# Patient Record
Sex: Male | Born: 1950 | Race: Black or African American | Hispanic: No | Marital: Married | Smoking: Former smoker
Health system: Southern US, Community
[De-identification: ages and names within clinical notes are randomized; demographics above are authoritative.]

## PROBLEM LIST (undated history)

## (undated) DIAGNOSIS — N186 End stage renal disease: Secondary | ICD-10-CM

## (undated) DIAGNOSIS — D649 Anemia, unspecified: Secondary | ICD-10-CM

## (undated) DIAGNOSIS — I10 Essential (primary) hypertension: Secondary | ICD-10-CM

## (undated) DIAGNOSIS — Z9289 Personal history of other medical treatment: Secondary | ICD-10-CM

## (undated) DIAGNOSIS — E039 Hypothyroidism, unspecified: Secondary | ICD-10-CM

## (undated) DIAGNOSIS — E119 Type 2 diabetes mellitus without complications: Secondary | ICD-10-CM

## (undated) DIAGNOSIS — S0990XA Unspecified injury of head, initial encounter: Secondary | ICD-10-CM

## (undated) DIAGNOSIS — K5900 Constipation, unspecified: Secondary | ICD-10-CM

## (undated) DIAGNOSIS — C8593 Non-Hodgkin lymphoma, unspecified, intra-abdominal lymph nodes: Secondary | ICD-10-CM

## (undated) DIAGNOSIS — R7303 Prediabetes: Secondary | ICD-10-CM

## (undated) DIAGNOSIS — C8338 Diffuse large B-cell lymphoma, lymph nodes of multiple sites: Secondary | ICD-10-CM

## (undated) DIAGNOSIS — R569 Unspecified convulsions: Secondary | ICD-10-CM

## (undated) DIAGNOSIS — G839 Paralytic syndrome, unspecified: Secondary | ICD-10-CM

## (undated) DIAGNOSIS — N189 Chronic kidney disease, unspecified: Secondary | ICD-10-CM

## (undated) DIAGNOSIS — R1900 Intra-abdominal and pelvic swelling, mass and lump, unspecified site: Secondary | ICD-10-CM

## (undated) DIAGNOSIS — I639 Cerebral infarction, unspecified: Secondary | ICD-10-CM

## (undated) HISTORY — PX: COLONOSCOPY: SHX174

## (undated) HISTORY — DX: Intra-abdominal and pelvic swelling, mass and lump, unspecified site: R19.00

## (undated) HISTORY — PX: HEMORRHOID SURGERY: SHX153

## (undated) HISTORY — DX: Non-hodgkin lymphoma, unspecified, intra-abdominal lymph nodes: C85.93

## (undated) HISTORY — DX: Diffuse large b-cell lymphoma, lymph nodes of multiple sites: C83.38

---

## 1996-01-29 DIAGNOSIS — I639 Cerebral infarction, unspecified: Secondary | ICD-10-CM

## 1996-01-29 HISTORY — DX: Cerebral infarction, unspecified: I63.9

## 1997-07-28 ENCOUNTER — Encounter
Admission: RE | Admit: 1997-07-28 | Discharge: 1997-10-26 | Payer: Self-pay | Admitting: Physical Medicine & Rehabilitation

## 2001-02-18 ENCOUNTER — Encounter: Admission: RE | Admit: 2001-02-18 | Discharge: 2001-05-19 | Payer: Self-pay | Admitting: Internal Medicine

## 2013-01-15 ENCOUNTER — Emergency Department (HOSPITAL_COMMUNITY)
Admission: EM | Admit: 2013-01-15 | Discharge: 2013-01-16 | Disposition: A | Discharge status: Home / Self Care | Payer: PRIVATE HEALTH INSURANCE | Attending: Emergency Medicine | Admitting: Emergency Medicine

## 2013-01-15 ENCOUNTER — Encounter (HOSPITAL_COMMUNITY): Payer: Self-pay | Admitting: Emergency Medicine

## 2013-01-15 ENCOUNTER — Emergency Department (HOSPITAL_COMMUNITY): Payer: PRIVATE HEALTH INSURANCE

## 2013-01-15 DIAGNOSIS — Z79899 Other long term (current) drug therapy: Secondary | ICD-10-CM | POA: Insufficient documentation

## 2013-01-15 DIAGNOSIS — R42 Dizziness and giddiness: Secondary | ICD-10-CM | POA: Insufficient documentation

## 2013-01-15 DIAGNOSIS — Z7982 Long term (current) use of aspirin: Secondary | ICD-10-CM | POA: Insufficient documentation

## 2013-01-15 DIAGNOSIS — Z87891 Personal history of nicotine dependence: Secondary | ICD-10-CM | POA: Insufficient documentation

## 2013-01-15 DIAGNOSIS — E119 Type 2 diabetes mellitus without complications: Secondary | ICD-10-CM | POA: Insufficient documentation

## 2013-01-15 DIAGNOSIS — R61 Generalized hyperhidrosis: Secondary | ICD-10-CM | POA: Insufficient documentation

## 2013-01-15 DIAGNOSIS — I10 Essential (primary) hypertension: Secondary | ICD-10-CM | POA: Insufficient documentation

## 2013-01-15 DIAGNOSIS — E785 Hyperlipidemia, unspecified: Secondary | ICD-10-CM | POA: Insufficient documentation

## 2013-01-15 DIAGNOSIS — R55 Syncope and collapse: Secondary | ICD-10-CM

## 2013-01-15 DIAGNOSIS — I69959 Hemiplegia and hemiparesis following unspecified cerebrovascular disease affecting unspecified side: Secondary | ICD-10-CM | POA: Insufficient documentation

## 2013-01-15 HISTORY — DX: Cerebral infarction, unspecified: I63.9

## 2013-01-15 HISTORY — DX: Essential (primary) hypertension: I10

## 2013-01-15 HISTORY — DX: Type 2 diabetes mellitus without complications: E11.9

## 2013-01-15 LAB — POCT I-STAT, CHEM 8
BUN: 35 mg/dL — ABNORMAL HIGH (ref 6–23)
Chloride: 99 mEq/L (ref 96–112)
HCT: 37 % — ABNORMAL LOW (ref 39.0–52.0)
Sodium: 137 mEq/L (ref 135–145)
TCO2: 25 mmol/L (ref 0–100)

## 2013-01-15 LAB — CBC
Hemoglobin: 11.3 g/dL — ABNORMAL LOW (ref 13.0–17.0)
Platelets: 183 10*3/uL (ref 150–400)
RBC: 3.81 MIL/uL — ABNORMAL LOW (ref 4.22–5.81)
RDW: 13.4 % (ref 11.5–15.5)
WBC: 3.8 10*3/uL — ABNORMAL LOW (ref 4.0–10.5)

## 2013-01-15 LAB — BASIC METABOLIC PANEL
BUN: 35 mg/dL — ABNORMAL HIGH (ref 6–23)
CO2: 22 mEq/L (ref 19–32)
Calcium: 8.5 mg/dL (ref 8.4–10.5)
GFR calc non Af Amer: 15 mL/min — ABNORMAL LOW (ref >90)
Potassium: 2.9 mEq/L — ABNORMAL LOW (ref 3.5–5.1)
Sodium: 133 mEq/L — ABNORMAL LOW (ref 135–145)

## 2013-01-15 MED ORDER — SODIUM CHLORIDE 0.9 % IV BOLUS (SEPSIS)
500.0000 mL | Freq: Once | INTRAVENOUS | Status: AC
  Administered 2013-01-16: 500 mL via INTRAVENOUS

## 2013-01-15 MED ORDER — POTASSIUM CHLORIDE CRYS ER 20 MEQ PO TBCR
40.0000 meq | EXTENDED_RELEASE_TABLET | Freq: Once | ORAL | Status: AC
  Administered 2013-01-16: 40 meq via ORAL
  Filled 2013-01-15: qty 2

## 2013-01-15 NOTE — ED Provider Notes (Signed)
CSN: [id number]     Arrival date & time 01/15/13  2155 History   First MD Initiated Contact with Patient 01/15/13 2302     Chief Complaint  Patient presents with  . Weakness   (Consider location/radiation/quality/duration/timing/severity/associated sxs/prior Treatment) HPI This patient is an elderly man who has chronic right arm weakness from her remote stroke. In addition, he has hypertension and hyperlipidemia.  The patient is brought to the emergency department after near syncopal episode which occurred at home and was witnessed by his wife.  The patient had taken a nap in his recliner at home. His wife woke him up and felt that his skin is damp. As the patient was standing up, he felt lightheaded and sat back down into the chair. He did not lose consciousness. The wife says that his skin looked more diaphoretic. So, she called 911.  The patient has recollection of the event. He denies experiencing any palpitations, rapid heart rate, chest pain or shortness of breath. He reports normal by mouth intake. Review of systems is notable only for a complaint of scratchy throat-"like I am getting a head cold".  No recent medications changes. Patient reports compliance of all medicines.  Past Medical History  Diagnosis Date  . Hypertension   . Diabetes mellitus without complication   . Stroke    Past Surgical History  Procedure Laterality Date  . Hemorrhoid surgery     History reviewed. No pertinent family history. History  Substance Use Topics  . Smoking status: Former Research scientist (life sciences)  . Smokeless tobacco: Not on file  . Alcohol Use: No    Review of Systems 10 point review of systems obtained and is negative with the exception of symptoms noted above.  Allergies  Review of patient's allergies indicates no known allergies.  Home Medications   Current Outpatient Rx  Name  Route  Sig  Dispense  Refill  . aspirin 325 MG tablet   Oral   Take 325 mg by mouth daily.         Marland Kitchen  atorvastatin (LIPITOR) 40 MG tablet   Oral   Take 40 mg by mouth at bedtime.         . cloNIDine (CATAPRES) 0.2 MG tablet   Oral   Take 0.2 mg by mouth 2 (two) times daily.         . irbesartan (AVAPRO) 300 MG tablet   Oral   Take 300 mg by mouth daily.         . metoprolol (LOPRESSOR) 100 MG tablet   Oral   Take 100 mg by mouth 2 (two) times daily.         Marland Kitchen NIFEdipine (PROCARDIA XL/ADALAT-CC) 90 MG 24 hr tablet   Oral   Take 90 mg by mouth daily.         . potassium chloride (K-DUR,KLOR-CON) 10 MEQ tablet   Oral   Take 10 mEq by mouth 2 (two) times daily.         Marland Kitchen torsemide (DEMADEX) 100 MG tablet   Oral   Take 100 mg by mouth daily.          BP 131/81  Pulse 57  Temp(Src) 97.8 F (36.6 C) (Oral)  Resp 18  Ht 5\' 11"  (1.803 m)  Wt 216 lb (97.977 kg)  BMI 30.14 kg/m2  SpO2 97% Physical Exam Gen: well developed and well nourished appearing Head: NCAT Eyes: PERL, EOMI Nose: no epistaixis or rhinorrhea Mouth/throat: mucosa is moist and pink Neck:  supple, no stridor Lungs: CTA B, no wheezing, rhonchi or rales CV: RRR, no murmur, good extremity pulses  Abd: soft, notender, nondistended Back: no midline ttp Skin: warm and dry, no lesions Neuro: Left facial droop, right arm weakness, otherwise unremarkable neurologic exam..  Psyche; normal affect,  calm and cooperative.   ED Course  Procedures (including critical care time) Labs Review  Results for orders placed during the hospital encounter of 01/15/13 (from the past 24 hour(s))  BASIC METABOLIC PANEL     Status: Abnormal   Collection Time    01/15/13 10:56 PM      Result Value Range   Sodium 133 (*) 135 - 145 mEq/L   Potassium 2.9 (*) 3.5 - 5.1 mEq/L   Chloride 98  96 - 112 mEq/L   CO2 22  19 - 32 mEq/L   Glucose, Bld 143 (*) 70 - 99 mg/dL   BUN 35 (*) 6 - 23 mg/dL   Creatinine, Ser 3.92 (*) 0.50 - 1.35 mg/dL   Calcium 8.5  8.4 - 10.5 mg/dL   GFR calc non Af Amer 15 (*) >90 mL/min   GFR  calc Af Amer 17 (*) >90 mL/min  CBC     Status: Abnormal   Collection Time    01/15/13 10:56 PM      Result Value Range   WBC 3.8 (*) 4.0 - 10.5 K/uL   RBC 3.81 (*) 4.22 - 5.81 MIL/uL   Hemoglobin 11.3 (*) 13.0 - 17.0 g/dL   HCT 33.2 (*) 39.0 - 52.0 %   MCV 87.1  78.0 - 100.0 fL   MCH 29.7  26.0 - 34.0 pg   MCHC 34.0  30.0 - 36.0 g/dL   RDW 13.4  11.5 - 15.5 %   Platelets 183  150 - 400 K/uL  POCT I-STAT, CHEM 8     Status: Abnormal   Collection Time    01/15/13 11:11 PM      Result Value Range   Sodium 137  135 - 145 mEq/L   Potassium 2.9 (*) 3.5 - 5.1 mEq/L   Chloride 99  96 - 112 mEq/L   BUN 35 (*) 6 - 23 mg/dL   Creatinine, Ser 4.20 (*) 0.50 - 1.35 mg/dL   Glucose, Bld 145 (*) 70 - 99 mg/dL   Calcium, Ion 1.16  1.13 - 1.30 mmol/L   TCO2 25  0 - 100 mmol/L   Hemoglobin 12.6 (*) 13.0 - 17.0 g/dL   HCT 37.0 (*) 39.0 - 52.0 %  POCT I-STAT TROPONIN I     Status: None   Collection Time    01/15/13 11:52 PM      Result Value Range   Troponin i, poc 0.00  0.00 - 0.08 ng/mL   Comment 3           URINALYSIS, ROUTINE W REFLEX MICROSCOPIC     Status: Abnormal   Collection Time    01/16/13 12:56 AM      Result Value Range   Color, Urine YELLOW  YELLOW   APPearance CLEAR  CLEAR   Specific Gravity, Urine 1.013  1.005 - 1.030   pH 6.0  5.0 - 8.0   Glucose, UA NEGATIVE  NEGATIVE mg/dL   Hgb urine dipstick SMALL (*) NEGATIVE   Bilirubin Urine NEGATIVE  NEGATIVE   Ketones, ur NEGATIVE  NEGATIVE mg/dL   Protein, ur 100 (*) NEGATIVE mg/dL   Urobilinogen, UA 0.2  0.0 - 1.0 mg/dL   Nitrite NEGATIVE  NEGATIVE   Leukocytes, UA NEGATIVE  NEGATIVE  URINE MICROSCOPIC-ADD ON     Status: None   Collection Time    01/16/13 12:56 AM      Result Value Range   Squamous Epithelial / LPF RARE  RARE   WBC, UA 0-2  <3 WBC/hpf   RBC / HPF 0-2  <3 RBC/hpf   Bacteria, UA RARE  RARE  POCT I-STAT TROPONIN I     Status: None   Collection Time    01/16/13  2:34 AM      Result Value Range    Troponin i, poc 0.00  0.00 - 0.08 ng/mL   Comment 3            CXR: normal cardiac silloute, normal appearing mediastinum, no infiltrates, no acute process identified.   EKG Interpretation    Date/Time:  Friday January 15 2013 22:07:37 EST Ventricular Rate:  59 PR Interval:  197 QRS Duration: 98 QT Interval:  456 QTC Calculation: 452 R Axis:   -26 Text Interpretation:  Sinus rhythm Borderline left axis deviation Borderline T wave abnormalities Confirmed by [facility]  MD, Devian Bartolomei ([id number]) on 01/15/2013 11:03:02 PM            MDM  ED work up for near syncope is unremarkable with normal EKG, unremarkable labs, normal CXR, normal U/A, normal orthostatic VS. The patient has no cardiac history making cardiogenic syncope less likely. In light of postural change to standing preceding this episode, I suspect he had a vasovagal episode. He has ambulated in the hallway, is asking to go home and is stable for discharge.    Elyn Peers, MD 01/16/13 [phone]

## 2013-01-15 NOTE — ED Notes (Signed)
Patients wife stated when she woke him up from a nap approximately 45 min ago he had a spell where he became very pale and skin was clammy.  Stated he has been fighting a cold since Tuesday with a scratchy throat.  No N/V/D, no pain just feels bad.

## 2013-01-15 NOTE — ED Notes (Signed)
Patient presents via [facility] from home.  Staff reported that wife stated he had been feeling weak since Tuesday.  Skin clammy, incontinent when napping.

## 2013-01-16 LAB — URINALYSIS, ROUTINE W REFLEX MICROSCOPIC
Glucose, UA: NEGATIVE mg/dL
Ketones, ur: NEGATIVE mg/dL
Leukocytes, UA: NEGATIVE
Specific Gravity, Urine: 1.013 (ref 1.005–1.030)
Urobilinogen, UA: 0.2 mg/dL (ref 0.0–1.0)
pH: 6 (ref 5.0–8.0)

## 2013-01-16 LAB — URINE MICROSCOPIC-ADD ON

## 2013-01-16 LAB — POCT I-STAT TROPONIN I: Troponin i, poc: 0 ng/mL (ref 0.00–0.08)

## 2013-01-16 NOTE — ED Notes (Signed)
Pt ambulated in hall, with min assist.

## 2014-04-02 ENCOUNTER — Encounter (HOSPITAL_COMMUNITY): Payer: Self-pay | Admitting: Emergency Medicine

## 2014-04-02 ENCOUNTER — Emergency Department (HOSPITAL_COMMUNITY)
Admission: EM | Admit: 2014-04-02 | Discharge: 2014-04-03 | Disposition: A | Discharge status: Home / Self Care | Payer: PRIVATE HEALTH INSURANCE | Attending: Emergency Medicine | Admitting: Emergency Medicine

## 2014-04-02 DIAGNOSIS — Z79899 Other long term (current) drug therapy: Secondary | ICD-10-CM | POA: Diagnosis not present

## 2014-04-02 DIAGNOSIS — Z7982 Long term (current) use of aspirin: Secondary | ICD-10-CM | POA: Insufficient documentation

## 2014-04-02 DIAGNOSIS — K5904 Chronic idiopathic constipation: Secondary | ICD-10-CM

## 2014-04-02 DIAGNOSIS — Z8673 Personal history of transient ischemic attack (TIA), and cerebral infarction without residual deficits: Secondary | ICD-10-CM | POA: Diagnosis not present

## 2014-04-02 DIAGNOSIS — Z87891 Personal history of nicotine dependence: Secondary | ICD-10-CM | POA: Insufficient documentation

## 2014-04-02 DIAGNOSIS — K5900 Constipation, unspecified: Secondary | ICD-10-CM | POA: Diagnosis not present

## 2014-04-02 DIAGNOSIS — I10 Essential (primary) hypertension: Secondary | ICD-10-CM | POA: Insufficient documentation

## 2014-04-02 DIAGNOSIS — K602 Anal fissure, unspecified: Secondary | ICD-10-CM

## 2014-04-02 DIAGNOSIS — K625 Hemorrhage of anus and rectum: Secondary | ICD-10-CM | POA: Diagnosis present

## 2014-04-02 DIAGNOSIS — E119 Type 2 diabetes mellitus without complications: Secondary | ICD-10-CM | POA: Insufficient documentation

## 2014-04-02 LAB — CBC WITH DIFFERENTIAL/PLATELET
Basophils Absolute: 0.1 10*3/uL (ref 0.0–0.1)
Basophils Relative: 1 % (ref 0–1)
EOS ABS: 0.3 10*3/uL (ref 0.0–0.7)
Eosinophils Relative: 4 % (ref 0–5)
HEMATOCRIT: 35 % — AB (ref 39.0–52.0)
Hemoglobin: 11.7 g/dL — ABNORMAL LOW (ref 13.0–17.0)
LYMPHS PCT: 26 % (ref 12–46)
Lymphs Abs: 1.6 10*3/uL (ref 0.7–4.0)
MCH: 29.3 pg (ref 26.0–34.0)
MCHC: 33.4 g/dL (ref 30.0–36.0)
MCV: 87.5 fL (ref 78.0–100.0)
MONO ABS: 0.4 10*3/uL (ref 0.1–1.0)
Monocytes Relative: 7 % (ref 3–12)
NEUTROS ABS: 3.8 10*3/uL (ref 1.7–7.7)
Neutrophils Relative %: 62 % (ref 43–77)
PLATELETS: 274 10*3/uL (ref 150–400)
RBC: 4 MIL/uL — AB (ref 4.22–5.81)
RDW: 13.5 % (ref 11.5–15.5)
WBC: 6.2 10*3/uL (ref 4.0–10.5)

## 2014-04-02 LAB — COMPREHENSIVE METABOLIC PANEL
ALBUMIN: 4.1 g/dL (ref 3.5–5.2)
ALT: 17 U/L (ref 0–53)
ANION GAP: 12 (ref 5–15)
AST: 24 U/L (ref 0–37)
Alkaline Phosphatase: 52 U/L (ref 39–117)
BUN: 50 mg/dL — ABNORMAL HIGH (ref 6–23)
CO2: 23 mmol/L (ref 19–32)
Calcium: 9.1 mg/dL (ref 8.4–10.5)
Chloride: 102 mmol/L (ref 96–112)
Creatinine, Ser: 5.56 mg/dL — ABNORMAL HIGH (ref 0.50–1.35)
GFR calc Af Amer: 11 mL/min — ABNORMAL LOW (ref >90)
GFR, EST NON AFRICAN AMERICAN: 10 mL/min — AB (ref >90)
Glucose, Bld: 112 mg/dL — ABNORMAL HIGH (ref 70–99)
POTASSIUM: 3.1 mmol/L — AB (ref 3.5–5.1)
SODIUM: 137 mmol/L (ref 135–145)
TOTAL PROTEIN: 8 g/dL (ref 6.0–8.3)
Total Bilirubin: 0.6 mg/dL (ref 0.3–1.2)

## 2014-04-02 LAB — TYPE AND SCREEN
ABO/RH(D): O POS
ANTIBODY SCREEN: NEGATIVE

## 2014-04-02 LAB — POC OCCULT BLOOD, ED: Fecal Occult Bld: POSITIVE — AB

## 2014-04-02 NOTE — ED Provider Notes (Signed)
CSN: [id number]     Arrival date & time 04/02/14  2008 History   First MD Initiated Contact with Patient 04/02/14 2335     Chief Complaint  Patient presents with  . Rectal Bleeding    The patient said he has had rectal bleeding but denies pain.  The patient's wife says it has been on gon for three weeks.     (Consider location/radiation/quality/duration/timing/severity/associated sxs/prior Treatment) HPI Comments: Patient reports intermittent episodes of rectal bleeding with BM's that are hard and dry--states the medication he takes causes this.  Has a HX of hemorrhoids requiring surgery.  Denies pain, blood in underwear   Patient is a 64 y.o. male presenting with hematochezia. The history is provided by the patient and the spouse.  Rectal Bleeding Quality:  Mucoid Amount:  Scant Duration:  3 weeks Timing:  Intermittent Progression:  Unchanged Chronicity:  New Context: constipation and defecation   Context: not diarrhea, not hemorrhoids, not rectal injury and not rectal pain   Similar prior episodes: yes   Relieved by:  Hemorrhoid cream Worsened by:  Defecation Ineffective treatments:  None tried Associated symptoms: no abdominal pain, no fever, no loss of consciousness and no vomiting     Past Medical History  Diagnosis Date  . Hypertension   . Diabetes mellitus without complication   . Stroke    Past Surgical History  Procedure Laterality Date  . Hemorrhoid surgery     History reviewed. No pertinent family history. History  Substance Use Topics  . Smoking status: Former Research scientist (life sciences)  . Smokeless tobacco: Not on file  . Alcohol Use: No    Review of Systems  Constitutional: Negative for fever and chills.  Gastrointestinal: Positive for constipation, blood in stool and hematochezia. Negative for nausea, vomiting, abdominal pain and abdominal distention.  Genitourinary: Negative for dysuria.  Neurological: Negative for loss of consciousness.  All other systems reviewed  and are negative.     Allergies  Review of patient's allergies indicates no known allergies.  Home Medications   Prior to Admission medications   Medication Sig Start Date End Date Taking? Authorizing Provider  aspirin 325 MG tablet Take 325 mg by mouth daily.   Yes Historical Provider, MD  bumetanide (BUMEX) 2 MG tablet Take 2 mg by mouth daily. 03/05/14  Yes Historical Provider, MD  cloNIDine (CATAPRES) 0.2 MG tablet Take 0.2 mg by mouth 2 (two) times daily.   Yes Historical Provider, MD  doxazosin (CARDURA) 4 MG tablet Take 4 mg by mouth every evening.  03/28/14  Yes Historical Provider, MD  levothyroxine (SYNTHROID, LEVOTHROID) 25 MCG tablet Take 12.5 mcg by mouth daily. 01/29/14  Yes Historical Provider, MD  metoprolol (LOPRESSOR) 100 MG tablet Take 100 mg by mouth 2 (two) times daily.   Yes Historical Provider, MD  NIFEdipine (ADALAT CC) 90 MG 24 hr tablet Take 90 mg by mouth daily. 03/28/14  Yes Historical Provider, MD  potassium chloride (K-DUR,KLOR-CON) 10 MEQ tablet Take 10 mEq by mouth 2 (two) times daily.   Yes Historical Provider, MD  docusate sodium (COLACE) 100 MG capsule Take 1 capsule (100 mg total) by mouth every 12 (twelve) hours. 04/03/14   Garald Balding, NP  NIFEdipine (PROCARDIA XL/ADALAT-CC) 90 MG 24 hr tablet Take 90 mg by mouth daily.    Historical Provider, MD   BP 199/116 mmHg  Pulse 80  Temp(Src) 98 F (36.7 C) (Oral)  Resp 18  SpO2 95% Physical Exam  Constitutional: He is oriented to person,  place, and time. He appears well-developed and well-nourished.  HENT:  Head: Normocephalic.  Eyes: Pupils are equal, round, and reactive to light.  Neck: Normal range of motion.  Cardiovascular: Normal rate and regular rhythm.   Pulmonary/Chest: Effort normal and breath sounds normal.  Abdominal: Soft. He exhibits no distension. There is no tenderness.  Genitourinary: Rectal exam shows no external hemorrhoid and no internal hemorrhoid. Guaiac positive stool. Prostate is  not enlarged and not tender.  At the 2 o clock location questionable fissura  Guaiac Minimally positive mucoid in appearance with slight tinge    Musculoskeletal: Normal range of motion. He exhibits no edema or tenderness.  Neurological: He is oriented to person, place, and time.  Skin: Skin is warm.  Nursing note and vitals reviewed.   ED Course  Procedures (including critical care time) Labs Review Labs Reviewed  COMPREHENSIVE METABOLIC PANEL - Abnormal; Notable for the following:    Potassium 3.1 (*)    Glucose, Bld 112 (*)    BUN 50 (*)    Creatinine, Ser 5.56 (*)    GFR calc non Af Amer 10 (*)    GFR calc Af Amer 11 (*)    All other components within normal limits  CBC WITH DIFFERENTIAL/PLATELET - Abnormal; Notable for the following:    RBC 4.00 (*)    Hemoglobin 11.7 (*)    HCT 35.0 (*)    All other components within normal limits  POC OCCULT BLOOD, ED - Abnormal; Notable for the following:    Fecal Occult Bld POSITIVE (*)    All other components within normal limits  POC OCCULT BLOOD, ED  TYPE AND SCREEN    Imaging Review No results found.   EKG Interpretation None      MDM   Final diagnoses:  Rectal fissure  Constipation - functional         Garald Balding, NP 04/03/14 0006  Hoy Morn, MD 04/03/14 [phone]

## 2014-04-02 NOTE — ED Notes (Signed)
The patient said he has had rectal bleeding but denies pain.  The patient's wife says it has been on gon for three weeks.  The patient said he had hemorrhoid surgery and he used to bleed then.  He does not know if he has hemorrhoids again or not.

## 2014-04-03 LAB — ABO/RH: ABO/RH(D): O POS

## 2014-04-03 MED ORDER — DOCUSATE SODIUM 100 MG PO CAPS: 100.0000 mg | ORAL_CAPSULE | Freq: Two times a day (BID) | ORAL | Status: DC

## 2014-04-03 NOTE — Discharge Instructions (Signed)
Anal Fissure, Adult An anal fissure is a small tear or crack in the skin around the anus. Bleeding from a fissure usually stops on its own within a few minutes. However, bleeding will often reoccur with each bowel movement until the crack heals.  CAUSES   Passing large, hard stools.  Frequent diarrheal stools.  Constipation.  Inflammatory bowel disease (Crohn's disease or ulcerative colitis).  Infections.  Anal sex. SYMPTOMS   Small amounts of blood seen on your stools, on toilet paper, or in the toilet after a bowel movement.  Rectal bleeding.  Painful bowel movements.  Itching or irritation around the anus. DIAGNOSIS Your caregiver will examine the anal area. An anal fissure can usually be seen with careful inspection. A rectal exam may be performed and a short tube (anoscope) may be used to examine the anal canal. TREATMENT   You may be instructed to take fiber supplements. These supplements can soften your stool to help make bowel movements easier.  Sitz baths may be recommended to help heal the tear. Do not use soap in the sitz baths.  A medicated cream or ointment may be prescribed to lessen discomfort. HOME CARE INSTRUCTIONS   Maintain a diet high in fruits, whole grains, and vegetables. Avoid constipating foods like bananas and dairy products.  Take sitz baths as directed by your caregiver.  Drink enough fluids to keep your urine clear or pale yellow.  Only take over-the-counter or prescription medicines for pain, discomfort, or fever as directed by your caregiver. Do not take aspirin as this may increase bleeding.  Do not use ointments containing numbing medications (anesthetics) or hydrocortisone. They could slow healing. SEEK MEDICAL CARE IF:   Your fissure is not completely healed within 3 days.  You have further bleeding.  You have a fever.  You have diarrhea mixed with blood.  You have pain.  Your problem is getting worse rather than  better. MAKE SURE YOU:   Understand these instructions.  Will watch your condition.  Will get help right away if you are not doing well or get worse. Document Released: 01/14/2005 Document Revised: 04/08/2011 Document Reviewed: 07/01/2010 [facility] Patient Information 2015 [place], [place]. This information is not intended to replace advice given to you by your health care provider. Make sure you discuss any questions you have with your health care provider. Make an appointment with your PCP for follow up evaluation

## 2014-06-29 ENCOUNTER — Emergency Department (HOSPITAL_COMMUNITY)
Admission: EM | Admit: 2014-06-29 | Discharge: 2014-06-29 | Disposition: A | Discharge status: Home / Self Care | Payer: PRIVATE HEALTH INSURANCE | Attending: Emergency Medicine | Admitting: Emergency Medicine

## 2014-06-29 ENCOUNTER — Emergency Department (HOSPITAL_COMMUNITY): Payer: PRIVATE HEALTH INSURANCE

## 2014-06-29 ENCOUNTER — Encounter (HOSPITAL_COMMUNITY): Payer: Self-pay | Admitting: *Deleted

## 2014-06-29 DIAGNOSIS — E119 Type 2 diabetes mellitus without complications: Secondary | ICD-10-CM | POA: Insufficient documentation

## 2014-06-29 DIAGNOSIS — Z8673 Personal history of transient ischemic attack (TIA), and cerebral infarction without residual deficits: Secondary | ICD-10-CM | POA: Diagnosis not present

## 2014-06-29 DIAGNOSIS — I10 Essential (primary) hypertension: Secondary | ICD-10-CM | POA: Insufficient documentation

## 2014-06-29 DIAGNOSIS — Z79899 Other long term (current) drug therapy: Secondary | ICD-10-CM | POA: Diagnosis not present

## 2014-06-29 DIAGNOSIS — R55 Syncope and collapse: Secondary | ICD-10-CM | POA: Diagnosis present

## 2014-06-29 DIAGNOSIS — R42 Dizziness and giddiness: Secondary | ICD-10-CM | POA: Diagnosis not present

## 2014-06-29 DIAGNOSIS — Z87891 Personal history of nicotine dependence: Secondary | ICD-10-CM | POA: Insufficient documentation

## 2014-06-29 DIAGNOSIS — R531 Weakness: Secondary | ICD-10-CM | POA: Diagnosis not present

## 2014-06-29 LAB — BASIC METABOLIC PANEL
Anion gap: 16 — ABNORMAL HIGH (ref 5–15)
BUN: 65 mg/dL — ABNORMAL HIGH (ref 6–20)
CO2: 21 mmol/L — ABNORMAL LOW (ref 22–32)
Calcium: 8.6 mg/dL — ABNORMAL LOW (ref 8.9–10.3)
Chloride: 93 mmol/L — ABNORMAL LOW (ref 101–111)
Creatinine, Ser: 6.41 mg/dL — ABNORMAL HIGH (ref 0.61–1.24)
GFR, EST AFRICAN AMERICAN: 9 mL/min — AB (ref >60)
GFR, EST NON AFRICAN AMERICAN: 8 mL/min — AB (ref >60)
GLUCOSE: 239 mg/dL — AB (ref 65–99)
Potassium: 3.1 mmol/L — ABNORMAL LOW (ref 3.5–5.1)
Sodium: 130 mmol/L — ABNORMAL LOW (ref 135–145)

## 2014-06-29 LAB — CBC
HCT: 32.5 % — ABNORMAL LOW (ref 39.0–52.0)
Hemoglobin: 10.9 g/dL — ABNORMAL LOW (ref 13.0–17.0)
MCH: 29.1 pg (ref 26.0–34.0)
MCHC: 33.5 g/dL (ref 30.0–36.0)
MCV: 86.7 fL (ref 78.0–100.0)
Platelets: 287 10*3/uL (ref 150–400)
RBC: 3.75 MIL/uL — AB (ref 4.22–5.81)
RDW: 13.5 % (ref 11.5–15.5)
WBC: 5.5 10*3/uL (ref 4.0–10.5)

## 2014-06-29 LAB — I-STAT TROPONIN, ED: TROPONIN I, POC: 0.01 ng/mL (ref 0.00–0.08)

## 2014-06-29 LAB — BRAIN NATRIURETIC PEPTIDE: B Natriuretic Peptide: 74.9 pg/mL (ref 0.0–100.0)

## 2014-06-29 MED ORDER — SODIUM CHLORIDE 0.9 % IV BOLUS (SEPSIS)
250.0000 mL | Freq: Once | INTRAVENOUS | Status: AC
  Administered 2014-06-29: 250 mL via INTRAVENOUS

## 2014-06-29 MED ORDER — SODIUM CHLORIDE 0.9 % IV SOLN
INTRAVENOUS | Status: DC
  Administered 2014-06-29: 21:00:00 via INTRAVENOUS

## 2014-06-29 NOTE — ED Notes (Signed)
Pt able to stand and he states he feels fine, denied dizziness, weakness or lightheadedness. Family present at bedside.

## 2014-06-29 NOTE — ED Notes (Signed)
Pt in stating he was at church earlier and was standing and felt dizzy and weak, thought he would pass out, pt sat down and symptoms resolved, denies any symptoms or pain at this time, no distress noted

## 2014-06-29 NOTE — Discharge Instructions (Signed)
Near-Syncope Near-syncope (commonly known as near fainting) is sudden weakness, dizziness, or feeling like you might pass out. This can happen when getting up or while standing for a long time. It is caused by a sudden decrease in blood flow to the brain, which can occur for various reasons. Most of the reasons are not serious.  HOME CARE Watch your condition for any changes.  Have someone stay with you until you feel stable.  If you feel like you are going to pass out:  Lie down right away.  Prop your feet up if you can.  Breathe deeply and steadily.  Move only when the feeling has gone away. Most of the time, this feeling lasts only a few minutes. You may feel tired for several hours.  Drink enough fluids to keep your pee (urine) clear or pale yellow.  If you are taking blood pressure or heart medicine, stand up slowly.  Follow up with your doctor as told. GET HELP RIGHT AWAY IF:   You have a severe headache.  You have unusual pain in the chest, belly (abdomen), or back.  You have bleeding from the mouth or butt (rectum), or you have black or tarry poop (stool).  You feel your heart beat differently than normal, or you have a very fast pulse.  You pass out, or you twitch and shake when you pass out.  You pass out when sitting or lying down.  You feel confused.  You have trouble walking.  You are weak.  You have vision problems. MAKE SURE YOU:   Understand these instructions.  Will watch your condition.  Will get help right away if you are not doing well or get worse. Document Released: 07/03/2007 Document Revised: 01/19/2013 Document Reviewed: 06/19/2012 [facility] Patient Information 2015 [place], [place]. This information is not intended to replace advice given to you by your health care provider. Make sure you discuss any questions you have with your health care provider.  A Limited to follow-up with your regular doctor. Return for any new or worse symptoms.  Today's workup without any significant findings.

## 2014-06-29 NOTE — ED Provider Notes (Signed)
CSN: [id number]     Arrival date & time 06/29/14  1755 History   First MD Initiated Contact with Patient 06/29/14 1925     Chief Complaint  Patient presents with  . Near Syncope     (Consider location/radiation/quality/duration/timing/severity/associated sxs/prior Treatment) Patient is a 64 y.o. male presenting with near-syncope. The history is provided by the patient.  Near Syncope Pertinent negatives include no chest pain, no abdominal pain, no headaches and no shortness of breath.   patient was at church while standing felt dizzy and weak no room spinning. Thought maybe he would pass out. Patient felt fine earlier in the day. Patient has a history of a prior stroke with residual right-sided weakness. Patient without any worsening of any focal deficits. Patient denies any chest pain headache vertigo abdominal pain nausea or vomiting.  Past Medical History  Diagnosis Date  . Hypertension   . Diabetes mellitus without complication   . Stroke    Past Surgical History  Procedure Laterality Date  . Hemorrhoid surgery     History reviewed. No pertinent family history. History  Substance Use Topics  . Smoking status: Former Research scientist (life sciences)  . Smokeless tobacco: Not on file  . Alcohol Use: No    Review of Systems  Constitutional: Negative for fever.  HENT: Negative for congestion.   Eyes: Negative for visual disturbance.  Respiratory: Negative for shortness of breath.   Cardiovascular: Positive for near-syncope. Negative for chest pain.  Gastrointestinal: Negative for nausea and abdominal pain.  Genitourinary: Negative for dysuria.  Musculoskeletal: Negative for back pain and neck pain.  Neurological: Positive for dizziness, weakness and light-headedness. Negative for syncope, speech difficulty and headaches.  Hematological: Does not bruise/bleed easily.  Psychiatric/Behavioral: Negative for confusion.      Allergies  Review of patient's allergies indicates no known  allergies.  Home Medications   Prior to Admission medications   Medication Sig Start Date End Date Taking? Authorizing Provider  aspirin 325 MG tablet Take 325 mg by mouth daily.    Historical Provider, MD  bumetanide (BUMEX) 2 MG tablet Take 2 mg by mouth daily. 03/05/14   Historical Provider, MD  cloNIDine (CATAPRES) 0.2 MG tablet Take 0.2 mg by mouth 2 (two) times daily.    Historical Provider, MD  docusate sodium (COLACE) 100 MG capsule Take 1 capsule (100 mg total) by mouth every 12 (twelve) hours. 04/03/14   Junius Creamer, NP  doxazosin (CARDURA) 4 MG tablet Take 4 mg by mouth every evening.  03/28/14   Historical Provider, MD  levothyroxine (SYNTHROID, LEVOTHROID) 25 MCG tablet Take 12.5 mcg by mouth daily. 01/29/14   Historical Provider, MD  metoprolol (LOPRESSOR) 100 MG tablet Take 100 mg by mouth 2 (two) times daily.    Historical Provider, MD  NIFEdipine (ADALAT CC) 90 MG 24 hr tablet Take 90 mg by mouth daily. 03/28/14   Historical Provider, MD  NIFEdipine (PROCARDIA XL/ADALAT-CC) 90 MG 24 hr tablet Take 90 mg by mouth daily.    Historical Provider, MD  potassium chloride (K-DUR,KLOR-CON) 10 MEQ tablet Take 10 mEq by mouth 2 (two) times daily.    Historical Provider, MD   BP 172/105 mmHg  Pulse 75  Temp(Src) 98 F (36.7 C) (Oral)  Resp 19  Ht 5\' 11"  (1.803 m)  Wt 210 lb (95.255 kg)  BMI 29.30 kg/m2  SpO2 100% Physical Exam  Constitutional: He is oriented to person, place, and time. He appears well-developed and well-nourished. No distress.  HENT:  Head: Normocephalic and  atraumatic.  Mouth/Throat: Oropharynx is clear and moist.  Eyes: Conjunctivae and EOM are normal. Pupils are equal, round, and reactive to light.  Neck: Normal range of motion. Neck supple.  Cardiovascular: Normal rate and regular rhythm.   No murmur heard. Pulmonary/Chest: Effort normal and breath sounds normal.  Abdominal: Soft. Bowel sounds are normal.  Musculoskeletal: Normal range of motion.  Brace on  right leg for foot drop.  Neurological: He is alert and oriented to person, place, and time. No cranial nerve deficit. He exhibits normal muscle tone. Coordination normal.  Except for baseline residual of right upper extremity and right lower extremity weakness from previous stroke.  Skin: Skin is warm. No rash noted.  Nursing note and vitals reviewed.   ED Course  Procedures (including critical care time) Labs Review Labs Reviewed  CBC - Abnormal; Notable for the following:    RBC 3.75 (*)    Hemoglobin 10.9 (*)    HCT 32.5 (*)    All other components within normal limits  BASIC METABOLIC PANEL - Abnormal; Notable for the following:    Sodium 130 (*)    Potassium 3.1 (*)    Chloride 93 (*)    CO2 21 (*)    Glucose, Bld 239 (*)    BUN 65 (*)    Creatinine, Ser 6.41 (*)    Calcium 8.6 (*)    GFR calc non Af Amer 8 (*)    GFR calc Af Amer 9 (*)    Anion gap 16 (*)    All other components within normal limits  BRAIN NATRIURETIC PEPTIDE  I-STAT TROPOININ, ED   Results for orders placed or performed during the hospital encounter of 06/29/14  CBC  Result Value Ref Range   WBC 5.5 4.0 - 10.5 K/uL   RBC 3.75 (L) 4.22 - 5.81 MIL/uL   Hemoglobin 10.9 (L) 13.0 - 17.0 g/dL   HCT 32.5 (L) 39.0 - 52.0 %   MCV 86.7 78.0 - 100.0 fL   MCH 29.1 26.0 - 34.0 pg   MCHC 33.5 30.0 - 36.0 g/dL   RDW 13.5 11.5 - 15.5 %   Platelets 287 150 - 400 K/uL  Basic metabolic panel  Result Value Ref Range   Sodium 130 (L) 135 - 145 mmol/L   Potassium 3.1 (L) 3.5 - 5.1 mmol/L   Chloride 93 (L) 101 - 111 mmol/L   CO2 21 (L) 22 - 32 mmol/L   Glucose, Bld 239 (H) 65 - 99 mg/dL   BUN 65 (H) 6 - 20 mg/dL   Creatinine, Ser 6.41 (H) 0.61 - 1.24 mg/dL   Calcium 8.6 (L) 8.9 - 10.3 mg/dL   GFR calc non Af Amer 8 (L) >60 mL/min   GFR calc Af Amer 9 (L) >60 mL/min   Anion gap 16 (H) 5 - 15  BNP (order ONLY if patient complains of dyspnea/SOB AND you have documented it for THIS visit)  Result Value Ref  Range   B Natriuretic Peptide 74.9 0.0 - 100.0 pg/mL  I-stat troponin, ED  (not at [facility])  Result Value Ref Range   Troponin i, poc 0.01 0.00 - 0.08 ng/mL   Comment 3             Imaging Review Dg Chest 2 View  06/29/2014   CLINICAL DATA:  Near syncope. Previous stroke. Hypertension. Diabetes.  EXAM: CHEST  2 VIEW  COMPARISON:  01/15/2013  FINDINGS: Heart size remains at the upper limits of normal.  Mild tortuosity of thoracic aorta is stable. Mild elevation of right hemidiaphragm is unchanged. No evidence of pulmonary infiltrate or edema. No evidence of pleural effusion. No mass or lymphadenopathy identified.  IMPRESSION: Stable exam.  No active disease.   Electronically Signed   By: Earle Gell M.D.   On: 06/29/2014 18:29   Ct Head Wo Contrast  06/29/2014   CLINICAL DATA:  Dizziness in weakness.  Presyncope.  EXAM: CT HEAD WITHOUT CONTRAST  TECHNIQUE: Contiguous axial images were obtained from the base of the skull through the vertex without intravenous contrast.  COMPARISON:  None.  FINDINGS: No mass lesion, mass effect, midline shift, hydrocephalus, hemorrhage. No acute territorial cortical ischemia/infarct. Atrophy and chronic ischemic white matter disease is present. Encephalomalacia is present in the LEFT periventricular white matter extending to the corona radiata. The appearance suggests an old basal ganglia hemorrhagic infarct. Enlarged subarachnoid spaces associated with atrophy. Intracranial atherosclerosis. Calvarium intact.  IMPRESSION: No acute intracranial abnormality. Atrophy, chronic ischemic white matter disease and LEFT periventricular infarct.   Electronically Signed   By: Dereck Ligas M.D.   On: 06/29/2014 20:31     EKG Interpretation   Date/Time:  Wednesday June 29 2014 18:05:20 EDT Ventricular Rate:  92 PR Interval:  180 QRS Duration: 96 QT Interval:  380 QTC Calculation: 469 R Axis:   -17 Text Interpretation:  Normal sinus rhythm Minimal voltage criteria for   LVH, may be normal variant Cannot rule out Anterior infarct , age  undetermined Abnormal ECG Confirmed by Greysin Medlen  MD, Kalyan Barabas [phone]) on  06/29/2014 7:41:52 PM      MDM   Final diagnoses:  Near syncope    Patient with feeling of near syncope while at church prior to arrival. Patient felt fine earlier in the day. Patient known to have renal insufficiency. Renal function is a little bit worse. Patient may have had some mild dehydration. Seemed to be improved with fluids. Head CT negative chest x-ray negative EKG without acute changes troponin negative. Labs other than the renal stuff we discussed without significant changes. Some mild hyponatremia some mild hypokalemia. No anemia. No leukocytosis. Patient stable for discharge home and follow-up with his record Dr.    Fredia Sorrow, MD 06/29/14 2232

## 2014-08-23 ENCOUNTER — Other Ambulatory Visit: Payer: Self-pay | Admitting: *Deleted

## 2014-08-23 DIAGNOSIS — N185 Chronic kidney disease, stage 5: Secondary | ICD-10-CM

## 2014-08-23 DIAGNOSIS — Z01810 Encounter for preprocedural cardiovascular examination: Secondary | ICD-10-CM

## 2014-09-22 ENCOUNTER — Encounter: Payer: Self-pay | Admitting: Vascular Surgery

## 2014-09-23 ENCOUNTER — Ambulatory Visit (HOSPITAL_COMMUNITY)
Admission: RE | Admit: 2014-09-23 | Discharge: 2014-09-23 | Disposition: A | Discharge status: Home / Self Care | Payer: PRIVATE HEALTH INSURANCE | Source: Ambulatory Visit | Attending: Vascular Surgery | Admitting: Vascular Surgery

## 2014-09-23 ENCOUNTER — Ambulatory Visit (INDEPENDENT_AMBULATORY_CARE_PROVIDER_SITE_OTHER)
Admission: RE | Admit: 2014-09-23 | Discharge: 2014-09-23 | Disposition: A | Discharge status: Home / Self Care | Payer: PRIVATE HEALTH INSURANCE | Source: Ambulatory Visit | Attending: Vascular Surgery | Admitting: Vascular Surgery

## 2014-09-23 ENCOUNTER — Encounter: Payer: Self-pay | Admitting: Vascular Surgery

## 2014-09-23 ENCOUNTER — Ambulatory Visit (INDEPENDENT_AMBULATORY_CARE_PROVIDER_SITE_OTHER): Payer: PRIVATE HEALTH INSURANCE | Admitting: Vascular Surgery

## 2014-09-23 ENCOUNTER — Other Ambulatory Visit: Payer: Self-pay

## 2014-09-23 VITALS — BP 169/94 | HR 61 | Temp 97.9°F | Resp 18 | Ht 71.0 in | Wt 217.0 lb

## 2014-09-23 DIAGNOSIS — N185 Chronic kidney disease, stage 5: Secondary | ICD-10-CM

## 2014-09-23 DIAGNOSIS — Z01810 Encounter for preprocedural cardiovascular examination: Secondary | ICD-10-CM

## 2014-09-23 DIAGNOSIS — N184 Chronic kidney disease, stage 4 (severe): Secondary | ICD-10-CM

## 2014-09-23 NOTE — Progress Notes (Signed)
Patient ID: DONTRA STONEKING, male   DOB: 1950/02/02, 64 y.o.   MRN: [id number]   [facility]  Patient name: Javier Jenkins MRN: [id number] DOB: 1950-12-10 Sex: male  REASON FOR CONSULT: Evaluate for hemodialysis access. Referred by Dr. Erling Cruz  HPI: Javier Jenkins is a 64 y.o. male who presents for evaluation for hemodialysis access. He denies any recent uremic symptoms. Specifically he denies nausea, vomiting, fatigue, anorexia, or palpitations.  I have reviewed the records from Dr. Ollen Gross Powell's office. 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 In addition she has a history of diabetes. She was sent for evaluation for hemodialysis access.  Of note, he had a significant left hemispheric stroke associated with right-sided weakness in 1998. This makes it very difficult for him to position his right arm which is somewhat contracted.  Past Medical History  Diagnosis Date  . Hypertension   . Diabetes mellitus without complication   . Stroke    Family History  Problem Relation Age of Onset  . Hypertension Mother    SOCIAL HISTORY: Social History  Substance Use Topics  . Smoking status: Former Smoker    Types: Pipe    Quit date: 09/22/1984  . Smokeless tobacco: Never Used  . Alcohol Use: No   No Known Allergies Current Outpatient Prescriptions  Medication Sig Dispense Refill  . aspirin 325 MG tablet Take 325 mg by mouth daily.    . cloNIDine (CATAPRES) 0.2 MG tablet Take 0.2 mg by mouth 2 (two) times daily.    Marland Kitchen docusate sodium (COLACE) 100 MG capsule Take 1 capsule (100 mg total) by mouth every 12 (twelve) hours. 60 capsule 0  . doxazosin (CARDURA) 4 MG tablet Take 4 mg by mouth every evening.   6  . furosemide (LASIX) 80 MG tablet Take 80 mg by mouth 2 (two) times daily.    Marland Kitchen Isosorb Dinitrate-Hydralazine (BIDIL PO) Take by mouth.    . levothyroxine (SYNTHROID, LEVOTHROID) 25 MCG  tablet Take 12.5 mcg by mouth daily.  0  . metoprolol (LOPRESSOR) 100 MG tablet Take 100 mg by mouth 2 (two) times daily.    Marland Kitchen NIFEdipine (ADALAT CC) 90 MG 24 hr tablet Take 90 mg by mouth daily.  1  . NIFEdipine (PROCARDIA XL/ADALAT-CC) 90 MG 24 hr tablet Take 90 mg by mouth daily.    . potassium chloride (K-DUR,KLOR-CON) 10 MEQ tablet Take 10 mEq by mouth 2 (two) times daily.    . bumetanide (BUMEX) 2 MG tablet Take 2 mg by mouth daily.  1   No current facility-administered medications for this visit.   REVIEW OF SYSTEMS: Valu.Nieves ] denotes positive finding; [  ] denotes negative finding  CARDIOVASCULAR:  [ ]  chest pain   [ ]  chest pressure   [ ]  palpitations   [ ]  orthopnea   [ ]  dyspnea on exertion   [ ]  claudication   [ ]  rest pain   [ ]  DVT   [ ]  phlebitis PULMONARY:   [ ]  productive cough   [ ]  asthma   [ ]  wheezing NEUROLOGIC:   [ ]  weakness  [ ]  paresthesias  [ ]  aphasia  [ ]  amaurosis  [ ]  dizziness HEMATOLOGIC:   [ ]  bleeding problems   [ ]  clotting disorders MUSCULOSKELETAL:  [ ]  joint pain   [ ]  joint swelling [ ]  leg swelling GASTROINTESTINAL: [ ]   blood in stool  [ ]   hematemesis GENITOURINARY:  [ ]   dysuria  [ ]   hematuria PSYCHIATRIC:  [ ]  history of major depression INTEGUMENTARY:  [ ]  rashes  [ ]  ulcers CONSTITUTIONAL:  [ ]  fever   [ ]  chills  PHYSICAL EXAM: Filed Vitals:   09/23/14 1321 09/23/14 1323  BP: 175/96 169/94  Pulse: 61 61  Temp: 97.9 F (36.6 C)   Resp: 18   Height: 5\' 11"  (1.803 m)   Weight: 217 lb (98.431 kg)   SpO2: 97%    GENERAL: The patient is a well-nourished male, in no acute distress. The vital signs are documented above. CARDIAC: There is a regular rate and rhythm.  VASCULAR: I do not detect carotid bruits. He has palpable radial pulses bilaterally. PULMONARY: There is good air exchange bilaterally without wheezing or rales. ABDOMEN: Soft and non-tender with normal pitched bowel sounds.  MUSCULOSKELETAL: his right arm is somewhat contracted  from his previous stroke. NEUROLOGIC: he has significant right upper extremity and moderate right lower extremity weakness. SKIN: There are no ulcers or rashes noted. PSYCHIATRIC: The patient has a normal affect.  DATA:  ARTERIAL DUPLEX: I have independently interpreted her arterial duplex today which shows in the right arm the radial and ulnar arteries were not visualized. In the left arm that was a triphasic signal in the radial and ulnar positions.  UPPER EXTREMITY MAPPING: I have independently interpreted his upper extremity vein mapping. In the left arm and the forearm cephalic vein looks reasonable in size as does the upper arm cephalic vein. Likewise the basilic vein looks reasonable in size.  MEDICAL ISSUES:  STAGE V CHRONIC KIDNEY DISEASE: I would recommend placement of a fistula in the left arm. Because of the contracture in the right arm it would be difficult to position that arm for dialysis.I have explained the indications for placement of an AV fistula or AV graft. I've explained that if at all possible we will place an AV fistula.  I have reviewed the risks of placement of an AV fistula including but not limited to: failure of the fistula to mature, need for subsequent interventions, and thrombosis. In addition I have reviewed the potential complications of placement of an AV graft. These risks include, but are not limited to, graft thrombosis, graft infection, wound healing problems, bleeding, arm swelling, and steal syndrome. All the patient's questions were answered and they are agreeable to proceed with surgery. His surgery is scheduled for 10/18/2014.  HYPERTENSION: The patient's initial blood pressure today was elevated. We repeated this and this was still elevated. We have encouraged the patient to follow up with their primary care physician for management of their blood pressure.    [name] [facility] [place]: [phone]

## 2014-09-23 NOTE — Progress Notes (Signed)
Filed Vitals:   09/23/14 1321 09/23/14 1323  BP: 175/96 169/94  Pulse: 61 61  Temp: 97.9 F (36.6 C)   Resp: 18   Height: 5\' 11"  (1.803 m)   Weight: 217 lb (98.431 kg)   SpO2: 97%

## 2014-10-17 ENCOUNTER — Encounter (HOSPITAL_COMMUNITY): Payer: Self-pay | Admitting: *Deleted

## 2014-10-17 MED ORDER — DEXTROSE 5 % IV SOLN
1.5000 g | INTRAVENOUS | Status: AC
  Administered 2014-10-18: 1.5 g via INTRAVENOUS
  Filled 2014-10-17: qty 1.5

## 2014-10-17 NOTE — Progress Notes (Signed)
Pt denies any cardiac history, chest pain or sob. 

## 2014-10-18 ENCOUNTER — Ambulatory Visit (HOSPITAL_COMMUNITY)
Admission: RE | Admit: 2014-10-18 | Discharge: 2014-10-18 | Disposition: A | Discharge status: Home / Self Care | Payer: PRIVATE HEALTH INSURANCE | Source: Ambulatory Visit | Attending: Vascular Surgery | Admitting: Vascular Surgery

## 2014-10-18 ENCOUNTER — Other Ambulatory Visit: Payer: Self-pay | Admitting: *Deleted

## 2014-10-18 ENCOUNTER — Ambulatory Visit (HOSPITAL_COMMUNITY): Payer: PRIVATE HEALTH INSURANCE | Admitting: Certified Registered"

## 2014-10-18 ENCOUNTER — Encounter (HOSPITAL_COMMUNITY): Payer: Self-pay | Admitting: General Practice

## 2014-10-18 ENCOUNTER — Encounter (HOSPITAL_COMMUNITY)
Admission: RE | Disposition: A | Discharge status: Home / Self Care | Payer: Self-pay | Source: Ambulatory Visit | Attending: Vascular Surgery

## 2014-10-18 DIAGNOSIS — Z87891 Personal history of nicotine dependence: Secondary | ICD-10-CM | POA: Insufficient documentation

## 2014-10-18 DIAGNOSIS — Z79899 Other long term (current) drug therapy: Secondary | ICD-10-CM | POA: Diagnosis not present

## 2014-10-18 DIAGNOSIS — Z7982 Long term (current) use of aspirin: Secondary | ICD-10-CM | POA: Diagnosis not present

## 2014-10-18 DIAGNOSIS — Z4931 Encounter for adequacy testing for hemodialysis: Secondary | ICD-10-CM

## 2014-10-18 DIAGNOSIS — E1122 Type 2 diabetes mellitus with diabetic chronic kidney disease: Secondary | ICD-10-CM | POA: Diagnosis not present

## 2014-10-18 DIAGNOSIS — I69351 Hemiplegia and hemiparesis following cerebral infarction affecting right dominant side: Secondary | ICD-10-CM | POA: Diagnosis not present

## 2014-10-18 DIAGNOSIS — N184 Chronic kidney disease, stage 4 (severe): Secondary | ICD-10-CM | POA: Diagnosis not present

## 2014-10-18 DIAGNOSIS — N185 Chronic kidney disease, stage 5: Secondary | ICD-10-CM | POA: Diagnosis not present

## 2014-10-18 DIAGNOSIS — I120 Hypertensive chronic kidney disease with stage 5 chronic kidney disease or end stage renal disease: Secondary | ICD-10-CM | POA: Insufficient documentation

## 2014-10-18 DIAGNOSIS — N186 End stage renal disease: Secondary | ICD-10-CM | POA: Diagnosis present

## 2014-10-18 DIAGNOSIS — E039 Hypothyroidism, unspecified: Secondary | ICD-10-CM | POA: Diagnosis not present

## 2014-10-18 HISTORY — DX: Constipation, unspecified: K59.00

## 2014-10-18 HISTORY — DX: Hypothyroidism, unspecified: E03.9

## 2014-10-18 HISTORY — PX: AV FISTULA PLACEMENT: SHX1204

## 2014-10-18 HISTORY — DX: Chronic kidney disease, unspecified: N18.9

## 2014-10-18 LAB — POCT I-STAT 4, (NA,K, GLUC, HGB,HCT)
GLUCOSE: 142 mg/dL — AB (ref 65–99)
HEMATOCRIT: 32 % — AB (ref 39.0–52.0)
Hemoglobin: 10.9 g/dL — ABNORMAL LOW (ref 13.0–17.0)
POTASSIUM: 3.3 mmol/L — AB (ref 3.5–5.1)
Sodium: 140 mmol/L (ref 135–145)

## 2014-10-18 LAB — GLUCOSE, CAPILLARY: Glucose-Capillary: 110 mg/dL — ABNORMAL HIGH (ref 65–99)

## 2014-10-18 SURGERY — ARTERIOVENOUS (AV) FISTULA CREATION
Anesthesia: Monitor Anesthesia Care | Site: Arm Lower | Laterality: Left

## 2014-10-18 MED ORDER — LACTATED RINGERS IV SOLN: INTRAVENOUS | Status: DC

## 2014-10-18 MED ORDER — CHLORHEXIDINE GLUCONATE CLOTH 2 % EX PADS: 6.0000 | MEDICATED_PAD | Freq: Once | CUTANEOUS | Status: DC

## 2014-10-18 MED ORDER — LIDOCAINE-EPINEPHRINE (PF) 1 %-1:200000 IJ SOLN
INTRAMUSCULAR | Status: AC
  Filled 2014-10-18: qty 30

## 2014-10-18 MED ORDER — SODIUM CHLORIDE 0.9 % IV SOLN
INTRAVENOUS | Status: DC | PRN
  Administered 2014-10-18: 10:00:00

## 2014-10-18 MED ORDER — PROMETHAZINE HCL 25 MG/ML IJ SOLN: 6.2500 mg | INTRAMUSCULAR | Status: DC | PRN

## 2014-10-18 MED ORDER — PROPOFOL INFUSION 10 MG/ML OPTIME
INTRAVENOUS | Status: DC | PRN
  Administered 2014-10-18: 100 ug/kg/min via INTRAVENOUS

## 2014-10-18 MED ORDER — PROPOFOL 10 MG/ML IV BOLUS
INTRAVENOUS | Status: AC
  Filled 2014-10-18: qty 20

## 2014-10-18 MED ORDER — OXYCODONE-ACETAMINOPHEN 5-325 MG PO TABS: 1.0000 | ORAL_TABLET | Freq: Four times a day (QID) | ORAL | Status: DC | PRN

## 2014-10-18 MED ORDER — LIDOCAINE-EPINEPHRINE (PF) 1 %-1:200000 IJ SOLN
INTRAMUSCULAR | Status: DC | PRN
  Administered 2014-10-18: 15 mL via INTRADERMAL

## 2014-10-18 MED ORDER — HEPARIN SODIUM (PORCINE) 1000 UNIT/ML IJ SOLN
INTRAMUSCULAR | Status: DC | PRN
  Administered 2014-10-18: 3000 [IU] via INTRAVENOUS
  Administered 2014-10-18: 2000 [IU] via INTRAVENOUS
  Administered 2014-10-18: 7000 [IU] via INTRAVENOUS

## 2014-10-18 MED ORDER — KETAMINE HCL 100 MG/ML IJ SOLN
INTRAMUSCULAR | Status: AC
  Filled 2014-10-18: qty 1

## 2014-10-18 MED ORDER — FENTANYL CITRATE (PF) 100 MCG/2ML IJ SOLN
25.0000 ug | INTRAMUSCULAR | Status: DC | PRN
  Administered 2014-10-18: 25 ug via INTRAVENOUS

## 2014-10-18 MED ORDER — SODIUM CHLORIDE 0.9 % IV SOLN
INTRAVENOUS | Status: DC
  Administered 2014-10-18: 08:00:00 via INTRAVENOUS

## 2014-10-18 MED ORDER — 0.9 % SODIUM CHLORIDE (POUR BTL) OPTIME
TOPICAL | Status: DC | PRN
  Administered 2014-10-18: 1000 mL

## 2014-10-18 MED ORDER — PROPOFOL 10 MG/ML IV BOLUS
INTRAVENOUS | Status: DC | PRN
  Administered 2014-10-18: 40 mg via INTRAVENOUS
  Administered 2014-10-18: 10 mg via INTRAVENOUS
  Administered 2014-10-18: 40 mg via INTRAVENOUS
  Administered 2014-10-18: 10 mg via INTRAVENOUS

## 2014-10-18 MED ORDER — MIDAZOLAM HCL 2 MG/2ML IJ SOLN: 0.5000 mg | Freq: Once | INTRAMUSCULAR | Status: DC | PRN

## 2014-10-18 MED ORDER — MEPERIDINE HCL 25 MG/ML IJ SOLN: 6.2500 mg | INTRAMUSCULAR | Status: DC | PRN

## 2014-10-18 MED ORDER — FENTANYL CITRATE (PF) 100 MCG/2ML IJ SOLN
INTRAMUSCULAR | Status: AC
  Filled 2014-10-18: qty 2

## 2014-10-18 MED ORDER — THROMBIN 20000 UNITS EX SOLR
CUTANEOUS | Status: AC
  Filled 2014-10-18: qty 20000

## 2014-10-18 MED ORDER — PROTAMINE SULFATE 10 MG/ML IV SOLN
INTRAVENOUS | Status: DC | PRN
  Administered 2014-10-18: 30 mg via INTRAVENOUS

## 2014-10-18 MED ORDER — KETAMINE HCL 10 MG/ML IJ SOLN
INTRAMUSCULAR | Status: DC | PRN
  Administered 2014-10-18: 10 mg via INTRAVENOUS
  Administered 2014-10-18 (×3): 20 mg via INTRAVENOUS
  Administered 2014-10-18: 30 mg via INTRAVENOUS

## 2014-10-18 MED ORDER — LIDOCAINE HCL (PF) 1 % IJ SOLN
INTRAMUSCULAR | Status: AC
  Filled 2014-10-18: qty 30

## 2014-10-18 SURGICAL SUPPLY — 33 items
ARMBAND PINK RESTRICT EXTREMIT (MISCELLANEOUS) ×2 IMPLANT
CANISTER SUCTION 2500CC (MISCELLANEOUS) ×2 IMPLANT
CANNULA VESSEL 3MM 2 BLNT TIP (CANNULA) ×2 IMPLANT
CLIP TI MEDIUM 6 (CLIP) ×2 IMPLANT
CLIP TI WIDE RED SMALL 6 (CLIP) ×6 IMPLANT
COVER PROBE W GEL 5X96 (DRAPES) IMPLANT
DECANTER SPIKE VIAL GLASS SM (MISCELLANEOUS) ×2 IMPLANT
ELECT REM PT RETURN 9FT ADLT (ELECTROSURGICAL) ×2
ELECTRODE REM PT RTRN 9FT ADLT (ELECTROSURGICAL) ×1 IMPLANT
GLOVE BIO SURGEON STRL SZ7.5 (GLOVE) ×2 IMPLANT
GLOVE BIOGEL PI IND STRL 7.5 (GLOVE) ×1 IMPLANT
GLOVE BIOGEL PI IND STRL 8 (GLOVE) ×2 IMPLANT
GLOVE BIOGEL PI INDICATOR 7.5 (GLOVE) ×1
GLOVE BIOGEL PI INDICATOR 8 (GLOVE) ×2
GLOVE ECLIPSE 6.5 STRL STRAW (GLOVE) ×2 IMPLANT
GLOVE ECLIPSE 7.0 STRL STRAW (GLOVE) ×2 IMPLANT
GOWN STRL REUS W/ TWL LRG LVL3 (GOWN DISPOSABLE) ×3 IMPLANT
GOWN STRL REUS W/TWL LRG LVL3 (GOWN DISPOSABLE) ×3
KIT BASIN OR (CUSTOM PROCEDURE TRAY) ×2 IMPLANT
KIT ROOM TURNOVER OR (KITS) ×2 IMPLANT
LIQUID BAND (GAUZE/BANDAGES/DRESSINGS) ×4 IMPLANT
NS IRRIG 1000ML POUR BTL (IV SOLUTION) ×2 IMPLANT
PACK CV ACCESS (CUSTOM PROCEDURE TRAY) ×2 IMPLANT
PAD ARMBOARD 7.5X6 YLW CONV (MISCELLANEOUS) ×4 IMPLANT
SPONGE SURGIFOAM ABS GEL 100 (HEMOSTASIS) IMPLANT
SUT PROLENE 6 0 BV (SUTURE) ×8 IMPLANT
SUT SILK 3 0 (SUTURE) ×1
SUT SILK 3-0 18XBRD TIE 12 (SUTURE) ×1 IMPLANT
SUT VIC AB 3-0 SH 27 (SUTURE) ×2
SUT VIC AB 3-0 SH 27X BRD (SUTURE) ×2 IMPLANT
SUT VICRYL 4-0 PS2 18IN ABS (SUTURE) ×6 IMPLANT
UNDERPAD 30X30 INCONTINENT (UNDERPADS AND DIAPERS) ×2 IMPLANT
WATER STERILE IRR 1000ML POUR (IV SOLUTION) ×2 IMPLANT

## 2014-10-18 NOTE — Progress Notes (Signed)
Patient's blood pressure is 175/102 and states that is close to his normal.  Dr. Glennon Mac made aware.  Will continue to monitor.

## 2014-10-18 NOTE — Op Note (Signed)
    NAME: [name]   MRN: [id number] DOB: 06-01-1950    DATE OF OPERATION: 10/18/2014  PREOP DIAGNOSIS: Stage IV chronic kidney disease  POSTOP DIAGNOSIS: same  PROCEDURE:  1. Placement of left radial cephalic AV fistula 2. Ligation of left radiocephalic AV fistula and vein patch angioplasty of the radial artery 3. Left brachiocephalic AV fistula  SURGEON: Judeth Cornfield. Scot Dock, MD, FACS  ASSIST: Gerri Lins  ANESTHESIA: local with sedation   EBL: minimal  INDICATIONS: Javier Jenkins is a 64 y.o. male who is not yet on dialysis. He presents for new access.  FINDINGS: the forearm cephalic vein was small and I was not satisfied with the fistula at the completion. This reason the radial cephalic fistula was ligated and the vein patch angioplasty performed of the radial artery. A left brachiocephalic fistula was placed. There was a 4.5 mm upper arm cephalic vein.  TECHNIQUE: The patient was taken to the operating room and sedated by anesthesia. The left upper extremity was prepped and draped in usual sterile fashion. After the skin was anesthetized with 1% lidocaine, an oblique incision was made at the wrist. Here the radial artery was dissected free beneath the fascia and was a good sized artery. The cephalic vein was small but I thought it was worth attempting a radiocephalic fistula. The patient was heparinized. The vein was dissected free and ligated distally. It irrigated up with heparinized saline was a 3 mm vein. The radial artery was clamped proximally and distally and the vein sewn end to side to the artery using continuous 60 proline suture. At the completion the thrill was quite weak and I therefore elected to ligate the fistula and patch the radial artery with a vein patch. This was done with continuous 60 proline suture. The completion was good signal in the radial artery.  Attention was then turned to placement of a brachiocephalic AV fistula. After the skin was  anesthetized with lidocaine, a transverse incision was made just above the antecubital level. Here the brachial artery was dissected free beneath the fascia. Cephalic vein was ligated distally and irrigated nicely with heparin saline. The brachial artery was clamped proximally and distally and a longitudinal arteriotomy was made. The vein was sewn end-to-side to the artery using continuous 60 Coban suture. At the completion was a good thrill in the fistula and a good radial and ulnar signal with the Doppler. Hemostasis was obtained and wounds the wounds were closed with a pair of 3-0 Vicryl and the skin closed with 4-0 Vicryl. Liquid Lucianne Lei was applied. The patient tolerated the procedure well was transferred to the recovery room in stable condition. All needle and sponge counts were correct.  [name], MD, FACS [facility]  DATE OF DICTATION:   10/18/2014

## 2014-10-18 NOTE — Transfer of Care (Signed)
Immediate Anesthesia Transfer of Care Note  Patient: Javier Jenkins  Procedure(s) Performed: Procedure(s): ARTERIOVENOUS (AV) FISTULA CREATION (Left)  Patient Location: PACU  Anesthesia Type:MAC  Level of Consciousness: awake, alert , oriented and patient cooperative  Airway & Oxygen Therapy: Patient Spontanous Breathing and Patient connected to face mask oxygen  Post-op Assessment: Report given to RN, Post -op Vital signs reviewed and stable and Patient moving all extremities  Post vital signs: Reviewed and stable  Last Vitals:  Filed Vitals:   10/18/14 1226  BP:   Pulse:   Temp: 36.4 C  Resp:     Complications: No apparent anesthesia complications

## 2014-10-18 NOTE — Anesthesia Postprocedure Evaluation (Signed)
  Anesthesia Post-op Note  Patient: Javier Jenkins  Procedure(s) Performed: Procedure(s): ARTERIOVENOUS (AV) FISTULA CREATION (Left)  Patient Location: PACU  Anesthesia Type:MAC  Level of Consciousness: awake, alert , oriented and patient cooperative  Airway and Oxygen Therapy: Patient Spontanous Breathing  Post-op Pain: none  Post-op Assessment: Post-op Vital signs reviewed, Patient's Cardiovascular Status Stable, Respiratory Function Stable, Patent Airway, No signs of Nausea or vomiting and Pain level controlled              Post-op Vital Signs: Reviewed and stable  Last Vitals:  Filed Vitals:   10/18/14 1337  BP: 147/90  Pulse: 64  Temp:   Resp:     Complications: No apparent anesthesia complications

## 2014-10-18 NOTE — H&P (View-Only) (Signed)
Patient ID: Javier Jenkins, male   DOB: 1950-02-15, 64 y.o.   MRN: [id number]   [facility]  Patient name: Javier Jenkins MRN: [id number] DOB: 07/26/1950 Sex: male  REASON FOR CONSULT: Evaluate for hemodialysis access. Referred by Dr. Erling Cruz  HPI: Javier Jenkins is a 64 y.o. male who presents for evaluation for hemodialysis access. He denies any recent uremic symptoms. Specifically he denies nausea, vomiting, fatigue, anorexia, or palpitations.  I have reviewed the records from Dr. Ollen Gross Powell's office. 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 In addition she has a history of diabetes. She was sent for evaluation for hemodialysis access.  Of note, he had a significant left hemispheric stroke associated with right-sided weakness in 1998. This makes it very difficult for him to position his right arm which is somewhat contracted.  Past Medical History  Diagnosis Date  . Hypertension   . Diabetes mellitus without complication   . Stroke    Family History  Problem Relation Age of Onset  . Hypertension Mother    SOCIAL HISTORY: Social History  Substance Use Topics  . Smoking status: Former Smoker    Types: Pipe    Quit date: 09/22/1984  . Smokeless tobacco: Never Used  . Alcohol Use: No   No Known Allergies Current Outpatient Prescriptions  Medication Sig Dispense Refill  . aspirin 325 MG tablet Take 325 mg by mouth daily.    . cloNIDine (CATAPRES) 0.2 MG tablet Take 0.2 mg by mouth 2 (two) times daily.    Marland Kitchen docusate sodium (COLACE) 100 MG capsule Take 1 capsule (100 mg total) by mouth every 12 (twelve) hours. 60 capsule 0  . doxazosin (CARDURA) 4 MG tablet Take 4 mg by mouth every evening.   6  . furosemide (LASIX) 80 MG tablet Take 80 mg by mouth 2 (two) times daily.    Marland Kitchen Isosorb Dinitrate-Hydralazine (BIDIL PO) Take by mouth.    . levothyroxine (SYNTHROID, LEVOTHROID) 25 MCG  tablet Take 12.5 mcg by mouth daily.  0  . metoprolol (LOPRESSOR) 100 MG tablet Take 100 mg by mouth 2 (two) times daily.    Marland Kitchen NIFEdipine (ADALAT CC) 90 MG 24 hr tablet Take 90 mg by mouth daily.  1  . NIFEdipine (PROCARDIA XL/ADALAT-CC) 90 MG 24 hr tablet Take 90 mg by mouth daily.    . potassium chloride (K-DUR,KLOR-CON) 10 MEQ tablet Take 10 mEq by mouth 2 (two) times daily.    . bumetanide (BUMEX) 2 MG tablet Take 2 mg by mouth daily.  1   No current facility-administered medications for this visit.   REVIEW OF SYSTEMS: Valu.Nieves ] denotes positive finding; [  ] denotes negative finding  CARDIOVASCULAR:  [ ]  chest pain   [ ]  chest pressure   [ ]  palpitations   [ ]  orthopnea   [ ]  dyspnea on exertion   [ ]  claudication   [ ]  rest pain   [ ]  DVT   [ ]  phlebitis PULMONARY:   [ ]  productive cough   [ ]  asthma   [ ]  wheezing NEUROLOGIC:   [ ]  weakness  [ ]  paresthesias  [ ]  aphasia  [ ]  amaurosis  [ ]  dizziness HEMATOLOGIC:   [ ]  bleeding problems   [ ]  clotting disorders MUSCULOSKELETAL:  [ ]  joint pain   [ ]  joint swelling [ ]  leg swelling GASTROINTESTINAL: [ ]   blood in stool  [ ]   hematemesis GENITOURINARY:  [ ]   dysuria  [ ]   hematuria PSYCHIATRIC:  [ ]  history of major depression INTEGUMENTARY:  [ ]  rashes  [ ]  ulcers CONSTITUTIONAL:  [ ]  fever   [ ]  chills  PHYSICAL EXAM: Filed Vitals:   09/23/14 1321 09/23/14 1323  BP: 175/96 169/94  Pulse: 61 61  Temp: 97.9 F (36.6 C)   Resp: 18   Height: 5\' 11"  (1.803 m)   Weight: 217 lb (98.431 kg)   SpO2: 97%    GENERAL: The patient is a well-nourished male, in no acute distress. The vital signs are documented above. CARDIAC: There is a regular rate and rhythm.  VASCULAR: I do not detect carotid bruits. He has palpable radial pulses bilaterally. PULMONARY: There is good air exchange bilaterally without wheezing or rales. ABDOMEN: Soft and non-tender with normal pitched bowel sounds.  MUSCULOSKELETAL: his right arm is somewhat contracted  from his previous stroke. NEUROLOGIC: he has significant right upper extremity and moderate right lower extremity weakness. SKIN: There are no ulcers or rashes noted. PSYCHIATRIC: The patient has a normal affect.  DATA:  ARTERIAL DUPLEX: I have independently interpreted her arterial duplex today which shows in the right arm the radial and ulnar arteries were not visualized. In the left arm that was a triphasic signal in the radial and ulnar positions.  UPPER EXTREMITY MAPPING: I have independently interpreted his upper extremity vein mapping. In the left arm and the forearm cephalic vein looks reasonable in size as does the upper arm cephalic vein. Likewise the basilic vein looks reasonable in size.  MEDICAL ISSUES:  STAGE V CHRONIC KIDNEY DISEASE: I would recommend placement of a fistula in the left arm. Because of the contracture in the right arm it would be difficult to position that arm for dialysis.I have explained the indications for placement of an AV fistula or AV graft. I've explained that if at all possible we will place an AV fistula.  I have reviewed the risks of placement of an AV fistula including but not limited to: failure of the fistula to mature, need for subsequent interventions, and thrombosis. In addition I have reviewed the potential complications of placement of an AV graft. These risks include, but are not limited to, graft thrombosis, graft infection, wound healing problems, bleeding, arm swelling, and steal syndrome. All the patient's questions were answered and they are agreeable to proceed with surgery. His surgery is scheduled for 10/18/2014.  HYPERTENSION: The patient's initial blood pressure today was elevated. We repeated this and this was still elevated. We have encouraged the patient to follow up with their primary care physician for management of their blood pressure.    [name] [facility] [place]: [phone]

## 2014-10-18 NOTE — Anesthesia Preprocedure Evaluation (Addendum)
Anesthesia Evaluation  Patient identified by MRN, date of birth, ID band Patient awake    Reviewed: Allergy & Precautions, NPO status , Patient's Chart, lab work & pertinent test results  History of Anesthesia Complications Negative for: history of anesthetic complications  Airway Mallampati: I  TM Distance: >3 FB Neck ROM: Full    Dental  (+) Edentulous Upper, Edentulous Lower   Pulmonary former smoker (quit '86),    breath sounds clear to auscultation       Cardiovascular hypertension, Pt. on medications and Pt. on home beta blockers (-) angina Rhythm:Regular Rate:Normal     Neuro/Psych CVA (R side paresis), Residual Symptoms    GI/Hepatic Neg liver ROS, GERD  Medicated and Controlled,  Endo/Other  diabetes (diet controlled, glu 142)Hypothyroidism   Renal/GU CRFRenal disease (K+ 3.3, no dialysis yet)     Musculoskeletal   Abdominal   Peds  Hematology  (+) Blood dyscrasia (Hb 10.9), ,   Anesthesia Other Findings   Reproductive/Obstetrics                            Anesthesia Physical Anesthesia Plan  ASA: III  Anesthesia Plan: MAC   Post-op Pain Management:    Induction:   Airway Management Planned: Natural Airway and Simple Face Mask  Additional Equipment:   Intra-op Plan:   Post-operative Plan:   Informed Consent: I have reviewed the patients History and Physical, chart, labs and discussed the procedure including the risks, benefits and alternatives for the proposed anesthesia with the patient or authorized representative who has indicated his/her understanding and acceptance.     Plan Discussed with: CRNA and Surgeon  Anesthesia Plan Comments: (Plan routine monitors, MAC)        Anesthesia Quick Evaluation

## 2014-10-18 NOTE — Interval H&P Note (Signed)
History and Physical Interval Note:  10/18/2014 9:38 AM  Javier Jenkins  has presented today for surgery, with the diagnosis of Stage V Chronic Kidney Disease N18.5  The various methods of treatment have been discussed with the patient and family. After consideration of risks, benefits and other options for treatment, the patient has consented to  Procedure(s): ARTERIOVENOUS (AV) FISTULA CREATION (Left) as a surgical intervention .  The patient's history has been reviewed, patient examined, no change in status, stable for surgery.  I have reviewed the patient's chart and labs.  Questions were answered to the patient's satisfaction.     [name]

## 2014-10-19 ENCOUNTER — Telehealth: Payer: Self-pay | Admitting: Vascular Surgery

## 2014-10-19 ENCOUNTER — Encounter (HOSPITAL_COMMUNITY): Payer: Self-pay | Admitting: Vascular Surgery

## 2014-10-19 NOTE — Telephone Encounter (Signed)
Spoke with pt to schedule, dpm °

## 2014-10-19 NOTE — Telephone Encounter (Signed)
-----   Message from Mena Goes, RN sent at 10/18/2014  3:07 PM EDT ----- Regarding: Schedule   ----- Message -----    From: Angelia Mould, MD    Sent: 10/18/2014  12:27 PM      To: [facility] Charge Pool Subject: charge                                         PROCEDURE:  1. Placement of left radial cephalic AV fistula 2. Ligation of left radiocephalic AV fistula and vein patch angioplasty of the radial artery 3. Left brachiocephalic AV fistula  SURGEON: Judeth Cornfield. Scot Dock, MD, FACS  ASSIST: Gerri Lins  He will need a follow up visit in 6 weeks with a duplex of his fistula at that time to check on the maturation of his fistula. Thank you. CD

## 2014-11-28 ENCOUNTER — Encounter: Payer: Self-pay | Admitting: Vascular Surgery

## 2014-11-30 ENCOUNTER — Ambulatory Visit (HOSPITAL_COMMUNITY)
Admission: RE | Admit: 2014-11-30 | Discharge: 2014-11-30 | Disposition: A | Discharge status: Home / Self Care | Payer: PRIVATE HEALTH INSURANCE | Source: Ambulatory Visit | Attending: Vascular Surgery | Admitting: Vascular Surgery

## 2014-11-30 ENCOUNTER — Ambulatory Visit (INDEPENDENT_AMBULATORY_CARE_PROVIDER_SITE_OTHER): Payer: PRIVATE HEALTH INSURANCE | Admitting: Vascular Surgery

## 2014-11-30 ENCOUNTER — Encounter: Payer: Self-pay | Admitting: Vascular Surgery

## 2014-11-30 VITALS — BP 173/104 | HR 59 | Ht 71.0 in | Wt 218.0 lb

## 2014-11-30 DIAGNOSIS — N186 End stage renal disease: Secondary | ICD-10-CM | POA: Insufficient documentation

## 2014-11-30 DIAGNOSIS — Z4931 Encounter for adequacy testing for hemodialysis: Secondary | ICD-10-CM

## 2014-11-30 DIAGNOSIS — N184 Chronic kidney disease, stage 4 (severe): Secondary | ICD-10-CM

## 2014-11-30 NOTE — Addendum Note (Signed)
Addended by: Dorthula Rue L on: 11/30/2014 02:14 PM   Modules accepted: Orders

## 2014-11-30 NOTE — Progress Notes (Signed)
Patient name: Javier Jenkins MRN: [id number] DOB: 1950/11/16 Sex: male  REASON FOR VISIT: follow up after ligation of left radiocephalic fistula and placement of a left brachiocephalic fistula.  HPI: Javier Jenkins is a 64 y.o. male who underwent ligation of a left radiocephalic fistula and placement of a brachiocephalic fistula on 123456. He comes in for a 6 week follow up visit. He has no specific complaints. He denies pain or paresthesias in his left upper extremity.  Current Outpatient Prescriptions  Medication Sig Dispense Refill  . aspirin 325 MG tablet Take 325 mg by mouth daily.    . cloNIDine (CATAPRES) 0.2 MG tablet Take 0.2 mg by mouth 2 (two) times daily.    Marland Kitchen docusate sodium (COLACE) 100 MG capsule Take 1 capsule (100 mg total) by mouth every 12 (twelve) hours. (Patient taking differently: Take 100 mg by mouth 2 (two) times daily as needed. ) 60 capsule 0  . doxazosin (CARDURA) 4 MG tablet Take 4 mg by mouth every evening.   6  . furosemide (LASIX) 80 MG tablet Take 80 mg by mouth 2 (two) times daily.    Marland Kitchen Isosorb Dinitrate-Hydralazine (BIDIL PO) Take by mouth. Takes a half pill twice a day    . levothyroxine (SYNTHROID, LEVOTHROID) 25 MCG tablet Take 12.5 mcg by mouth daily.  0  . metoprolol (LOPRESSOR) 100 MG tablet Take 100 mg by mouth 2 (two) times daily.    Marland Kitchen NIFEdipine (ADALAT CC) 90 MG 24 hr tablet Take 90 mg by mouth daily.  1  . NIFEdipine (PROCARDIA XL/ADALAT-CC) 90 MG 24 hr tablet Take 90 mg by mouth daily.    Marland Kitchen oxyCODONE-acetaminophen (PERCOCET/ROXICET) 5-325 MG per tablet Take 1 tablet by mouth every 6 (six) hours as needed. 30 tablet 0  . potassium chloride (K-DUR,KLOR-CON) 10 MEQ tablet Take 20 mEq by mouth once.      No current facility-administered medications for this visit.    REVIEW OF SYSTEMS:  [X]  denotes positive finding, [ ]  denotes negative finding Cardiac  Comments:  Chest pain or chest pressure:    Shortness of breath upon exertion:     Short of breath when lying flat:    Irregular heart rhythm:    Constitutional    Fever or chills:      PHYSICAL EXAM: Filed Vitals:   11/30/14 1130 11/30/14 1131  BP: 180/110 173/104  Pulse: 59   Height: 5\' 11"  (1.803 m)   Weight: 218 lb (98.884 kg)   SpO2: 98%     GENERAL: The patient is a well-nourished male, in no acute distress. The vital signs are documented above. CARDIOVASCULAR: There is a regular rate and rhythm. PULMONARY: There is good air exchange bilaterally without wheezing or rales. His left upper arm fistula has an excellent thrill. It is not pulsatile. He has a palpable left radial pulse. His incision is healed nicely.  DIALYSIS FISTULA DUPLEX: I have independently interpreted the duplex of his dialysis fistula. The diameters range from 0.44-0.45 centimeters. There are some elevated velocities in the mid segment of the upper arm cephalic vein suggesting stenosis in this area.  MEDICAL ISSUES:  STATUS POST LEFT BRACHIOCEPHALIC AV FISTULA: His fistula appears to be gradually maturing. It is not adequate sized yet but does have an excellent thrill. I have recommended a follow up duplex scan in 2 months and I'll see him back at that time. There are some elevated velocities in the midportion of the cephalic vein in the upper arm,  however the fistula is not pulsatile suggesting that this is not significant. I'll see him back in 2 months. He knows to call sooner if he has problems.  [name] [facility] [place]: [phone]

## 2015-01-24 ENCOUNTER — Encounter: Payer: Self-pay | Admitting: Vascular Surgery

## 2015-02-01 ENCOUNTER — Ambulatory Visit (INDEPENDENT_AMBULATORY_CARE_PROVIDER_SITE_OTHER): Payer: PRIVATE HEALTH INSURANCE | Admitting: Vascular Surgery

## 2015-02-01 ENCOUNTER — Encounter: Payer: Self-pay | Admitting: Vascular Surgery

## 2015-02-01 ENCOUNTER — Other Ambulatory Visit: Payer: Self-pay | Admitting: Vascular Surgery

## 2015-02-01 ENCOUNTER — Ambulatory Visit (HOSPITAL_COMMUNITY)
Admission: RE | Admit: 2015-02-01 | Discharge: 2015-02-01 | Disposition: A | Discharge status: Home / Self Care | Payer: PRIVATE HEALTH INSURANCE | Source: Ambulatory Visit | Attending: Vascular Surgery | Admitting: Vascular Surgery

## 2015-02-01 VITALS — BP 133/86 | HR 62 | Temp 96.9°F | Resp 16 | Ht 71.0 in | Wt 212.0 lb

## 2015-02-01 DIAGNOSIS — N184 Chronic kidney disease, stage 4 (severe): Secondary | ICD-10-CM

## 2015-02-01 DIAGNOSIS — Z4931 Encounter for adequacy testing for hemodialysis: Secondary | ICD-10-CM

## 2015-02-01 DIAGNOSIS — I129 Hypertensive chronic kidney disease with stage 1 through stage 4 chronic kidney disease, or unspecified chronic kidney disease: Secondary | ICD-10-CM | POA: Diagnosis not present

## 2015-02-01 DIAGNOSIS — E1122 Type 2 diabetes mellitus with diabetic chronic kidney disease: Secondary | ICD-10-CM | POA: Insufficient documentation

## 2015-02-01 NOTE — Progress Notes (Signed)
Patient name: Javier Jenkins MRN: [id number] DOB: 27-Sep-1950 Sex: male  REASON FOR VISIT: Follow up of AV fistula  HPI: Javier Jenkins is a 65 y.o. male who underwent ligation of a left radiocephalic AV fistula and placement of a left brachiocephalic AV fistula on 123456. When I saw him last on 11-16 the vein was not adequate in size and there were some elevated velocities in the mid segment of the upper arm cephalic vein. He comes in for a 2 month follow up visit. He is not on dialysis. He has no specific complaints today.  Current Outpatient Prescriptions  Medication Sig Dispense Refill  . aspirin 325 MG tablet Take 325 mg by mouth daily.    . cloNIDine (CATAPRES) 0.2 MG tablet Take 0.2 mg by mouth 2 (two) times daily.    Marland Kitchen docusate sodium (COLACE) 100 MG capsule Take 1 capsule (100 mg total) by mouth every 12 (twelve) hours. (Patient taking differently: Take 100 mg by mouth 2 (two) times daily as needed. ) 60 capsule 0  . doxazosin (CARDURA) 4 MG tablet Take 4 mg by mouth every evening.   6  . furosemide (LASIX) 80 MG tablet Take 80 mg by mouth 2 (two) times daily.    Marland Kitchen Isosorb Dinitrate-Hydralazine (BIDIL PO) Take by mouth. Takes a half pill twice a day    . levothyroxine (SYNTHROID, LEVOTHROID) 25 MCG tablet Take 12.5 mcg by mouth daily.  0  . metoprolol (LOPRESSOR) 100 MG tablet Take 100 mg by mouth 2 (two) times daily.    Marland Kitchen NIFEdipine (ADALAT CC) 90 MG 24 hr tablet Take 90 mg by mouth daily.  1  . NIFEdipine (PROCARDIA XL/ADALAT-CC) 90 MG 24 hr tablet Take 90 mg by mouth daily.    . potassium chloride (K-DUR,KLOR-CON) 10 MEQ tablet Take 20 mEq by mouth once.     Marland Kitchen oxyCODONE-acetaminophen (PERCOCET/ROXICET) 5-325 MG per tablet Take 1 tablet by mouth every 6 (six) hours as needed. (Patient not taking: Reported on 02/01/2015) 30 tablet 0   No current facility-administered medications for this visit.    REVIEW OF SYSTEMS:  [X]  denotes positive finding, [ ]  denotes negative  finding Cardiac  Comments:  Chest pain or chest pressure:    Shortness of breath upon exertion:    Short of breath when lying flat:    Irregular heart rhythm:    Constitutional    Fever or chills:      PHYSICAL EXAM: Filed Vitals:   02/01/15 0856  BP: 133/86  Pulse: 62  Temp: 96.9 F (36.1 C)  Resp: 16  Height: 5\' 11"  (1.803 m)  Weight: 212 lb (96.163 kg)  SpO2: 96%    GENERAL: The patient is a well-nourished male, in no acute distress. The vital signs are documented above. CARDIOVASCULAR: There is a regular rate and rhythm. PULMONARY: There is good air exchange bilaterally without wheezing or rales. His left upper arm fistula has a good thrill. It appears to be maturing adequately. He has a palpable left radial pulse. He has no significant arm swelling.  DIALYSIS FISTULA DUPLEX: I have independently interpreted his duplex of this fistula. Diameters of the fistula ranged from 0.55-0.65 cm. There is an area of increased velocity in the midportion of the fistula.  MEDICAL ISSUES:  STAGE IV CHRONIC KIDNEY DISEASE:The fistula appears to be gradually enlarging. I think it could be used for dialysis if it were needed. If there were any problems with dialysis that he could undergo venogram in  order to determine if the elevated velocities in the central portion of the fistula or significant. However given that the vein is continuing to enlarge we'll try to avoid contrast at this point. I'll see him back in 3 months with a follow up duplex scan. He knows to call sooner if he has problems.  HYPERTENSION: The patient's initial blood pressure today was elevated. We repeated this and this was still elevated. We have encouraged the patient to follow up with their primary care physician for management of their blood pressure.   [name] [facility] [place]: [phone]

## 2015-02-02 ENCOUNTER — Other Ambulatory Visit: Payer: Self-pay | Admitting: *Deleted

## 2015-02-02 DIAGNOSIS — Z992 Dependence on renal dialysis: Secondary | ICD-10-CM

## 2015-02-02 DIAGNOSIS — N186 End stage renal disease: Secondary | ICD-10-CM

## 2015-03-17 ENCOUNTER — Emergency Department (HOSPITAL_COMMUNITY): Payer: PRIVATE HEALTH INSURANCE | Admitting: Certified Registered Nurse Anesthetist

## 2015-03-17 ENCOUNTER — Inpatient Hospital Stay (HOSPITAL_COMMUNITY)
Admission: EM | Admit: 2015-03-17 | Discharge: 2015-03-23 | DRG: 026 | Disposition: A | Discharge status: Home / Self Care | Payer: PRIVATE HEALTH INSURANCE | Attending: Neurosurgery | Admitting: Neurosurgery

## 2015-03-17 ENCOUNTER — Emergency Department (HOSPITAL_COMMUNITY): Payer: PRIVATE HEALTH INSURANCE

## 2015-03-17 ENCOUNTER — Encounter (HOSPITAL_COMMUNITY): Payer: Self-pay | Admitting: *Deleted

## 2015-03-17 ENCOUNTER — Encounter (HOSPITAL_COMMUNITY)
Admission: EM | Disposition: A | Discharge status: Home / Self Care | Payer: Self-pay | Source: Home / Self Care | Attending: Neurosurgery

## 2015-03-17 DIAGNOSIS — I6200 Nontraumatic subdural hemorrhage, unspecified: Secondary | ICD-10-CM | POA: Diagnosis not present

## 2015-03-17 DIAGNOSIS — I120 Hypertensive chronic kidney disease with stage 5 chronic kidney disease or end stage renal disease: Secondary | ICD-10-CM | POA: Diagnosis present

## 2015-03-17 DIAGNOSIS — K5900 Constipation, unspecified: Secondary | ICD-10-CM | POA: Diagnosis present

## 2015-03-17 DIAGNOSIS — R260 Ataxic gait: Secondary | ICD-10-CM | POA: Diagnosis present

## 2015-03-17 DIAGNOSIS — R51 Headache: Secondary | ICD-10-CM | POA: Diagnosis not present

## 2015-03-17 DIAGNOSIS — S065X9A Traumatic subdural hemorrhage with loss of consciousness of unspecified duration, initial encounter: Principal | ICD-10-CM | POA: Diagnosis present

## 2015-03-17 DIAGNOSIS — W19XXXA Unspecified fall, initial encounter: Secondary | ICD-10-CM | POA: Diagnosis present

## 2015-03-17 DIAGNOSIS — E1122 Type 2 diabetes mellitus with diabetic chronic kidney disease: Secondary | ICD-10-CM | POA: Diagnosis present

## 2015-03-17 DIAGNOSIS — Z09 Encounter for follow-up examination after completed treatment for conditions other than malignant neoplasm: Secondary | ICD-10-CM

## 2015-03-17 DIAGNOSIS — R29810 Facial weakness: Secondary | ICD-10-CM | POA: Diagnosis present

## 2015-03-17 DIAGNOSIS — E039 Hypothyroidism, unspecified: Secondary | ICD-10-CM | POA: Diagnosis present

## 2015-03-17 DIAGNOSIS — Z87891 Personal history of nicotine dependence: Secondary | ICD-10-CM | POA: Diagnosis not present

## 2015-03-17 DIAGNOSIS — I69351 Hemiplegia and hemiparesis following cerebral infarction affecting right dominant side: Secondary | ICD-10-CM | POA: Diagnosis not present

## 2015-03-17 DIAGNOSIS — N185 Chronic kidney disease, stage 5: Secondary | ICD-10-CM | POA: Diagnosis present

## 2015-03-17 DIAGNOSIS — Z79899 Other long term (current) drug therapy: Secondary | ICD-10-CM | POA: Diagnosis not present

## 2015-03-17 DIAGNOSIS — Z9181 History of falling: Secondary | ICD-10-CM | POA: Diagnosis not present

## 2015-03-17 DIAGNOSIS — Z7982 Long term (current) use of aspirin: Secondary | ICD-10-CM

## 2015-03-17 DIAGNOSIS — S065XAA Traumatic subdural hemorrhage with loss of consciousness status unknown, initial encounter: Secondary | ICD-10-CM | POA: Diagnosis present

## 2015-03-17 HISTORY — PX: BURR HOLE: SHX908

## 2015-03-17 LAB — COMPREHENSIVE METABOLIC PANEL
ALBUMIN: 3.8 g/dL (ref 3.5–5.0)
ALK PHOS: 44 U/L (ref 38–126)
ALT: 23 U/L (ref 17–63)
ANION GAP: 15 (ref 5–15)
AST: 27 U/L (ref 15–41)
BILIRUBIN TOTAL: 0.5 mg/dL (ref 0.3–1.2)
BUN: 72 mg/dL — ABNORMAL HIGH (ref 6–20)
CALCIUM: 8.8 mg/dL — AB (ref 8.9–10.3)
CO2: 21 mmol/L — AB (ref 22–32)
CREATININE: 7.35 mg/dL — AB (ref 0.61–1.24)
Chloride: 101 mmol/L (ref 101–111)
GFR calc non Af Amer: 7 mL/min — ABNORMAL LOW (ref >60)
GFR, EST AFRICAN AMERICAN: 8 mL/min — AB (ref >60)
GLUCOSE: 126 mg/dL — AB (ref 65–99)
Potassium: 3.3 mmol/L — ABNORMAL LOW (ref 3.5–5.1)
SODIUM: 137 mmol/L (ref 135–145)
TOTAL PROTEIN: 7.8 g/dL (ref 6.5–8.1)

## 2015-03-17 LAB — CBC WITH DIFFERENTIAL/PLATELET
Basophils Absolute: 0 10*3/uL (ref 0.0–0.1)
Basophils Relative: 1 %
Eosinophils Absolute: 0.2 10*3/uL (ref 0.0–0.7)
Eosinophils Relative: 5 %
HEMATOCRIT: 30.4 % — AB (ref 39.0–52.0)
HEMOGLOBIN: 9.7 g/dL — AB (ref 13.0–17.0)
LYMPHS ABS: 0.9 10*3/uL (ref 0.7–4.0)
Lymphocytes Relative: 17 %
MCH: 28 pg (ref 26.0–34.0)
MCHC: 31.9 g/dL (ref 30.0–36.0)
MCV: 87.6 fL (ref 78.0–100.0)
MONOS PCT: 8 %
Monocytes Absolute: 0.4 10*3/uL (ref 0.1–1.0)
NEUTROS ABS: 3.6 10*3/uL (ref 1.7–7.7)
NEUTROS PCT: 69 %
Platelets: 246 10*3/uL (ref 150–400)
RBC: 3.47 MIL/uL — AB (ref 4.22–5.81)
RDW: 14 % (ref 11.5–15.5)
WBC: 5.1 10*3/uL (ref 4.0–10.5)

## 2015-03-17 LAB — I-STAT TROPONIN, ED: Troponin i, poc: 0 ng/mL (ref 0.00–0.08)

## 2015-03-17 LAB — CBG MONITORING, ED: GLUCOSE-CAPILLARY: 133 mg/dL — AB (ref 65–99)

## 2015-03-17 SURGERY — CREATION, CRANIAL BURR HOLE
Anesthesia: General | Site: Head | Laterality: Right

## 2015-03-17 MED ORDER — PROMETHAZINE HCL 25 MG/ML IJ SOLN: 6.2500 mg | INTRAMUSCULAR | Status: DC | PRN

## 2015-03-17 MED ORDER — POTASSIUM CHLORIDE CRYS ER 20 MEQ PO TBCR
20.0000 meq | EXTENDED_RELEASE_TABLET | Freq: Two times a day (BID) | ORAL | Status: DC
  Administered 2015-03-18 – 2015-03-23 (×12): 20 meq via ORAL
  Filled 2015-03-17 (×12): qty 1

## 2015-03-17 MED ORDER — 0.9 % SODIUM CHLORIDE (POUR BTL) OPTIME
TOPICAL | Status: DC | PRN
  Administered 2015-03-17 (×2): 1000 mL

## 2015-03-17 MED ORDER — PROPOFOL 10 MG/ML IV BOLUS
INTRAVENOUS | Status: AC
  Filled 2015-03-17: qty 20

## 2015-03-17 MED ORDER — VALPROATE SODIUM 500 MG/5ML IV SOLN
1000.0000 mg | Freq: Once | INTRAVENOUS | Status: AC
  Administered 2015-03-17: 1 g via INTRAVENOUS
  Filled 2015-03-17: qty 10

## 2015-03-17 MED ORDER — ONDANSETRON HCL 4 MG/2ML IJ SOLN
INTRAMUSCULAR | Status: DC | PRN
  Administered 2015-03-17: 4 mg via INTRAVENOUS

## 2015-03-17 MED ORDER — ROCURONIUM BROMIDE 50 MG/5ML IV SOLN
INTRAVENOUS | Status: AC
  Filled 2015-03-17: qty 1

## 2015-03-17 MED ORDER — PROPOFOL 10 MG/ML IV BOLUS
INTRAVENOUS | Status: DC | PRN
  Administered 2015-03-17: 30 mg via INTRAVENOUS

## 2015-03-17 MED ORDER — METOPROLOL TARTRATE 50 MG PO TABS
150.0000 mg | ORAL_TABLET | Freq: Two times a day (BID) | ORAL | Status: DC
  Administered 2015-03-18 – 2015-03-23 (×11): 150 mg via ORAL
  Filled 2015-03-17 (×9): qty 3
  Filled 2015-03-17: qty 6
  Filled 2015-03-17 (×4): qty 3

## 2015-03-17 MED ORDER — NIFEDIPINE ER OSMOTIC RELEASE 90 MG PO TB24
90.0000 mg | ORAL_TABLET | Freq: Every day | ORAL | Status: DC
  Administered 2015-03-18 – 2015-03-23 (×6): 90 mg via ORAL
  Filled 2015-03-17 (×6): qty 1

## 2015-03-17 MED ORDER — HYDROMORPHONE HCL 1 MG/ML IJ SOLN: 0.2500 mg | INTRAMUSCULAR | Status: DC | PRN

## 2015-03-17 MED ORDER — LIDOCAINE-EPINEPHRINE 1 %-1:100000 IJ SOLN
INTRAMUSCULAR | Status: DC | PRN
Start: 2015-03-17 — End: 2015-03-17
  Administered 2015-03-17: 1.5 mL via INTRADERMAL

## 2015-03-17 MED ORDER — CLONIDINE HCL 0.1 MG PO TABS
0.3000 mg | ORAL_TABLET | Freq: Two times a day (BID) | ORAL | Status: DC
  Administered 2015-03-18 – 2015-03-23 (×12): 0.3 mg via ORAL
  Filled 2015-03-17: qty 1
  Filled 2015-03-17 (×2): qty 3
  Filled 2015-03-17 (×5): qty 1
  Filled 2015-03-17: qty 3
  Filled 2015-03-17 (×2): qty 1
  Filled 2015-03-17: qty 3

## 2015-03-17 MED ORDER — FUROSEMIDE 80 MG PO TABS
160.0000 mg | ORAL_TABLET | Freq: Two times a day (BID) | ORAL | Status: DC
  Administered 2015-03-18 – 2015-03-23 (×11): 160 mg via ORAL
  Filled 2015-03-17 (×11): qty 2

## 2015-03-17 MED ORDER — ISOSORB DINITRATE-HYDRALAZINE 20-37.5 MG PO TABS
0.5000 | ORAL_TABLET | Freq: Two times a day (BID) | ORAL | Status: DC
  Administered 2015-03-18 – 2015-03-23 (×12): 0.5 via ORAL
  Filled 2015-03-17 (×13): qty 0.5

## 2015-03-17 MED ORDER — SUCCINYLCHOLINE CHLORIDE 20 MG/ML IJ SOLN
INTRAMUSCULAR | Status: DC | PRN
  Administered 2015-03-17: 100 mg via INTRAVENOUS

## 2015-03-17 MED ORDER — SUCCINYLCHOLINE CHLORIDE 20 MG/ML IJ SOLN
INTRAMUSCULAR | Status: AC
  Filled 2015-03-17: qty 1

## 2015-03-17 MED ORDER — ETOMIDATE 2 MG/ML IV SOLN
INTRAVENOUS | Status: DC | PRN
Start: 2015-03-17 — End: 2015-03-17
  Administered 2015-03-17: 20 mg via INTRAVENOUS

## 2015-03-17 MED ORDER — LABETALOL HCL 5 MG/ML IV SOLN
10.0000 mg | Freq: Once | INTRAVENOUS | Status: AC
  Administered 2015-03-17: 10 mg via INTRAVENOUS
  Filled 2015-03-17: qty 4

## 2015-03-17 MED ORDER — LIDOCAINE HCL (CARDIAC) 20 MG/ML IV SOLN
INTRAVENOUS | Status: DC | PRN
  Administered 2015-03-17: 60 mg via INTRAVENOUS

## 2015-03-17 MED ORDER — BUPIVACAINE HCL (PF) 0.5 % IJ SOLN
INTRAMUSCULAR | Status: DC | PRN
  Administered 2015-03-17: 1.5 mL

## 2015-03-17 MED ORDER — ONDANSETRON HCL 4 MG/2ML IJ SOLN
INTRAMUSCULAR | Status: AC
  Filled 2015-03-17: qty 2

## 2015-03-17 MED ORDER — SODIUM CHLORIDE 0.9 % IV BOLUS (SEPSIS): 1000.0000 mL | Freq: Once | INTRAVENOUS | Status: DC

## 2015-03-17 MED ORDER — HEMOSTATIC AGENTS (NO CHARGE) OPTIME
TOPICAL | Status: DC | PRN
  Administered 2015-03-17: 1 via TOPICAL

## 2015-03-17 MED ORDER — FENTANYL CITRATE (PF) 100 MCG/2ML IJ SOLN
INTRAMUSCULAR | Status: DC | PRN
  Administered 2015-03-17: 50 ug via INTRAVENOUS
  Administered 2015-03-17: 100 ug via INTRAVENOUS

## 2015-03-17 MED ORDER — LACTATED RINGERS IV SOLN
INTRAVENOUS | Status: DC | PRN
  Administered 2015-03-17: 22:00:00 via INTRAVENOUS

## 2015-03-17 MED ORDER — DOXAZOSIN MESYLATE 8 MG PO TABS
8.0000 mg | ORAL_TABLET | Freq: Every day | ORAL | Status: DC
  Administered 2015-03-18 – 2015-03-23 (×6): 8 mg via ORAL
  Filled 2015-03-17 (×7): qty 1

## 2015-03-17 MED ORDER — CEFAZOLIN SODIUM-DEXTROSE 2-3 GM-% IV SOLR
INTRAVENOUS | Status: DC | PRN
  Administered 2015-03-17: 2 g via INTRAVENOUS

## 2015-03-17 MED ORDER — FENTANYL CITRATE (PF) 250 MCG/5ML IJ SOLN
INTRAMUSCULAR | Status: AC
  Filled 2015-03-17: qty 5

## 2015-03-17 MED ORDER — THROMBIN 5000 UNITS EX SOLR
CUTANEOUS | Status: DC | PRN
  Administered 2015-03-17 (×2): 5000 [IU] via TOPICAL

## 2015-03-17 MED ORDER — LEVOTHYROXINE SODIUM 25 MCG PO TABS
12.5000 ug | ORAL_TABLET | Freq: Every day | ORAL | Status: DC
  Administered 2015-03-18 – 2015-03-23 (×6): 12.5 ug via ORAL
  Filled 2015-03-17 (×6): qty 1

## 2015-03-17 SURGICAL SUPPLY — 57 items
BANDAGE GAUZE 4  KLING STR (GAUZE/BANDAGES/DRESSINGS) IMPLANT
BIT DRILL WIRE PASS 1.3MM (BIT) IMPLANT
BLADE CLIPPER SURG (BLADE) IMPLANT
BRUSH SCRUB EZ PLAIN DRY (MISCELLANEOUS) ×2 IMPLANT
BUR ACORN 6.0 PRECISION (BURR) ×2 IMPLANT
BUR SPIRAL ROUTER 2.3 (BUR) IMPLANT
CANISTER SUCT 3000ML PPV (MISCELLANEOUS) ×2 IMPLANT
CATH FOLEY 2WAY SLVR  5CC 16FR (CATHETERS) ×1
CATH FOLEY 2WAY SLVR 5CC 16FR (CATHETERS) ×1 IMPLANT
DRAIN JACKSON PRATT 10MM FLAT (MISCELLANEOUS) ×2 IMPLANT
DRAIN SNY WOU 7FLT (WOUND CARE) IMPLANT
DRAPE SURG IRRIG POUCH 19X23 (DRAPES) IMPLANT
DRAPE WARM FLUID 44X44 (DRAPE) ×2 IMPLANT
DRILL WIRE PASS 1.3MM (BIT)
DRSG OPSITE POSTOP 4X6 (GAUZE/BANDAGES/DRESSINGS) ×2 IMPLANT
DRSG PAD ABDOMINAL 8X10 ST (GAUZE/BANDAGES/DRESSINGS) IMPLANT
DURAPREP 6ML APPLICATOR 50/CS (WOUND CARE) ×2 IMPLANT
ELECT CAUTERY BLADE 6.4 (BLADE) ×2 IMPLANT
ELECT REM PT RETURN 9FT ADLT (ELECTROSURGICAL) ×2
ELECTRODE REM PT RTRN 9FT ADLT (ELECTROSURGICAL) ×1 IMPLANT
EVACUATOR 1/8 PVC DRAIN (DRAIN) IMPLANT
EVACUATOR SILICONE 100CC (DRAIN) ×2 IMPLANT
GAUZE SPONGE 4X4 12PLY STRL (GAUZE/BANDAGES/DRESSINGS) IMPLANT
GAUZE SPONGE 4X4 16PLY XRAY LF (GAUZE/BANDAGES/DRESSINGS) IMPLANT
GLOVE BIO SURGEON STRL SZ 6.5 (GLOVE) ×6 IMPLANT
GLOVE BIOGEL M 8.0 STRL (GLOVE) ×2 IMPLANT
GLOVE EXAM NITRILE LRG STRL (GLOVE) IMPLANT
GLOVE EXAM NITRILE MD LF STRL (GLOVE) IMPLANT
GLOVE EXAM NITRILE XL STR (GLOVE) IMPLANT
GLOVE EXAM NITRILE XS STR PU (GLOVE) IMPLANT
GLOVE INDICATOR 6.5 STRL GRN (GLOVE) ×2 IMPLANT
GOWN STRL REUS W/ TWL LRG LVL3 (GOWN DISPOSABLE) ×2 IMPLANT
GOWN STRL REUS W/ TWL XL LVL3 (GOWN DISPOSABLE) IMPLANT
GOWN STRL REUS W/TWL 2XL LVL3 (GOWN DISPOSABLE) IMPLANT
GOWN STRL REUS W/TWL LRG LVL3 (GOWN DISPOSABLE) ×2
GOWN STRL REUS W/TWL XL LVL3 (GOWN DISPOSABLE)
HEMOSTAT SURGICEL 2X14 (HEMOSTASIS) ×2 IMPLANT
KIT BASIN OR (CUSTOM PROCEDURE TRAY) ×2 IMPLANT
KIT ROOM TURNOVER OR (KITS) ×2 IMPLANT
NS IRRIG 1000ML POUR BTL (IV SOLUTION) ×4 IMPLANT
PACK CRANIOTOMY (CUSTOM PROCEDURE TRAY) ×2 IMPLANT
PAD ARMBOARD 7.5X6 YLW CONV (MISCELLANEOUS) ×4 IMPLANT
PATTIES SURGICAL .5 X.5 (GAUZE/BANDAGES/DRESSINGS) IMPLANT
PATTIES SURGICAL .5 X3 (DISPOSABLE) IMPLANT
PATTIES SURGICAL 1X1 (DISPOSABLE) IMPLANT
PIN MAYFIELD SKULL DISP (PIN) IMPLANT
SPONGE NEURO XRAY DETECT 1X3 (DISPOSABLE) IMPLANT
SPONGE SURGIFOAM ABS GEL 100 (HEMOSTASIS) IMPLANT
STAPLER SKIN PROX WIDE 3.9 (STAPLE) ×2 IMPLANT
SUT NURALON 4 0 TR CR/8 (SUTURE) ×4 IMPLANT
SUT VIC AB 2-0 CP2 18 (SUTURE) ×4 IMPLANT
TOWEL OR 17X24 6PK STRL BLUE (TOWEL DISPOSABLE) ×2 IMPLANT
TOWEL OR 17X26 10 PK STRL BLUE (TOWEL DISPOSABLE) ×2 IMPLANT
TRAY FOLEY W/METER SILVER 14FR (SET/KITS/TRAYS/PACK) IMPLANT
TUBE CONNECTING 12X1/4 (SUCTIONS) ×2 IMPLANT
UNDERPAD 30X30 INCONTINENT (UNDERPADS AND DIAPERS) IMPLANT
WATER STERILE IRR 1000ML POUR (IV SOLUTION) ×2 IMPLANT

## 2015-03-17 NOTE — Anesthesia Preprocedure Evaluation (Addendum)
Anesthesia Evaluation  Patient identified by MRN, date of birth, ID band Patient awake    Reviewed: Allergy & Precautions, NPO status , Patient's Chart, lab work & pertinent test results  History of Anesthesia Complications Negative for: history of anesthetic complications  Airway Mallampati: I  TM Distance: >3 FB Neck ROM: Full    Dental  (+) Edentulous Upper, Edentulous Lower, Dental Advisory Given   Pulmonary former smoker,    Pulmonary exam normal        Cardiovascular hypertension, Pt. on medications Normal cardiovascular exam     Neuro/Psych CVA (R sided weakness), Residual Symptoms negative psych ROS   GI/Hepatic negative GI ROS, Neg liver ROS,   Endo/Other  diabetesHypothyroidism   Renal/GU Dialysis and ESRFRenal disease (K 3.3, no HD yet)     Musculoskeletal   Abdominal   Peds  Hematology   Anesthesia Other Findings   Reproductive/Obstetrics                        Anesthesia Physical Anesthesia Plan  ASA: III and emergent  Anesthesia Plan: General   Post-op Pain Management:    Induction: Intravenous, Rapid sequence and Cricoid pressure planned  Airway Management Planned: Oral ETT  Additional Equipment:   Intra-op Plan:   Post-operative Plan: Extubation in OR  Informed Consent: I have reviewed the patients History and Physical, chart, labs and discussed the procedure including the risks, benefits and alternatives for the proposed anesthesia with the patient or authorized representative who has indicated his/her understanding and acceptance.   Dental advisory given  Plan Discussed with: CRNA, Anesthesiologist and Surgeon  Anesthesia Plan Comments:        Anesthesia Quick Evaluation

## 2015-03-17 NOTE — ED Notes (Signed)
Pt has been experiencing weakness and intermittent L occipital headache for several weeks.  Wife has noticed pt having increasing difficulty ambulating over the last few days.  Hx of R sided weakness r/t stroke 17 years prior.  Wife states she has noticed increased weakness to BOTH sides of body. While speaking with triage RN, pt became unresponsive and began vomiting, approx 30 sec.  By the time pt was wheeled back to room he was ao x 4.

## 2015-03-17 NOTE — Transfer of Care (Signed)
Immediate Anesthesia Transfer of Care Note  Patient: Javier Jenkins  Procedure(s) Performed: Procedure(s): BURR HOLES FOR RIGHT SUBDURAL HEMATOMA (Right)  Patient Location: ICU  Anesthesia Type:General  Level of Consciousness: awake, alert  and patient cooperative  Airway & Oxygen Therapy: Patient Spontanous Breathing  Post-op Assessment: Report given to RN and Post -op Vital signs reviewed and stable  Post vital signs: Reviewed and stable  Last Vitals:  Filed Vitals:   03/17/15 2130 03/17/15 2145  BP: 154/102 163/95  Pulse: 83 81  Temp:    Resp: 16 20    Complications: No apparent anesthesia complications

## 2015-03-17 NOTE — ED Notes (Signed)
Patient transported to MRI 

## 2015-03-17 NOTE — ED Notes (Signed)
Patient is resting comfortably. 

## 2015-03-17 NOTE — Anesthesia Procedure Notes (Signed)
Procedure Name: Intubation Date/Time: 03/17/2015 10:25 PM Performed by: Manuela Schwartz B Pre-anesthesia Checklist: Patient identified, Emergency Drugs available, Suction available, Patient being monitored and Timeout performed Patient Re-evaluated:Patient Re-evaluated prior to inductionOxygen Delivery Method: Circle system utilized Preoxygenation: Pre-oxygenation with 100% oxygen Intubation Type: IV induction and Rapid sequence Laryngoscope Size: Mac and 4 Grade View: Grade I Tube type: Oral Tube size: 8.0 mm Number of attempts: 1 Airway Equipment and Method: Stylet Placement Confirmation: ETT inserted through vocal cords under direct vision,  positive ETCO2 and breath sounds checked- equal and bilateral Secured at: 23 cm Tube secured with: Tape Dental Injury: Teeth and Oropharynx as per pre-operative assessment

## 2015-03-17 NOTE — ED Provider Notes (Signed)
CSN: [id number]     Arrival date & time 03/17/15  1731 History   First MD Initiated Contact with Patient 03/17/15 1818     Chief Complaint  Patient presents with  . Weakness  . Loss of Consciousness     (Consider location/radiation/quality/duration/timing/severity/associated sxs/prior Treatment) The history is provided by the patient.  Javier Jenkins is a 65 y.o. male history hypertension, stroke with R sided weakness, here with headaches, weakness. Patient states that he may have hit his head about a month ago. Over the last month or so, patient has been having progressive weakness and intermittent headaches. The wife noticed that over the last several days he has been having trouble walking. Today, She noticed that He really has trouble initiating his gait and has been very unsteady. When he was in triage, he apparently became unresponsive and vomited and now is back to baseline. Patient has any chest pain or shortness of breath.      Past Medical History  Diagnosis Date  . Hypertension   . Stroke [facility])     right side paralysis  . Hypothyroidism   . Diabetes mellitus without complication ([place])     not on medications  . Chronic kidney disease     stage 5  . Constipation    Past Surgical History  Procedure Laterality Date  . Hemorrhoid surgery    . Av fistula placement Left 10/18/2014    Procedure: ARTERIOVENOUS (AV) FISTULA CREATION;  Surgeon: Angelia Mould, MD;  Location: [facility] OR;  Service: Vascular;  Laterality: Left;   Family History  Problem Relation Age of Onset  . Hypertension Mother    Social History  Substance Use Topics  . Smoking status: Former Smoker    Types: Pipe    Quit date: 09/22/1984  . Smokeless tobacco: Never Used  . Alcohol Use: No    Review of Systems  Cardiovascular: Positive for syncope.  Neurological: Positive for weakness.  All other systems reviewed and are negative.     Allergies  Review of patient's allergies indicates no  known allergies.  Home Medications   Prior to Admission medications   Medication Sig Start Date End Date Taking? Authorizing Provider  aspirin 325 MG tablet Take 325 mg by mouth daily.   Yes Historical Provider, MD  cloNIDine (CATAPRES) 0.2 MG tablet Take 0.4 mg by mouth 2 (two) times daily.    Yes Historical Provider, MD  docusate sodium (COLACE) 100 MG capsule Take 1 capsule (100 mg total) by mouth every 12 (twelve) hours. Patient taking differently: Take 100 mg by mouth 2 (two) times daily as needed (constipation).  04/03/14  Yes Junius Creamer, NP  doxazosin (CARDURA) 8 MG tablet Take 8 mg by mouth at bedtime.  02/22/15  Yes Historical Provider, MD  furosemide (LASIX) 80 MG tablet Take 160 mg by mouth 2 (two) times daily.    Yes Historical Provider, MD  isosorbide-hydrALAZINE (BIDIL) 20-37.5 MG tablet Take 0.5 tablets by mouth 2 (two) times daily.   Yes Historical Provider, MD  levothyroxine (SYNTHROID, LEVOTHROID) 25 MCG tablet Take 12.5 mcg by mouth daily. 01/29/14  Yes Historical Provider, MD  metoprolol (LOPRESSOR) 100 MG tablet Take 150 mg by mouth 2 (two) times daily.    Yes Historical Provider, MD  NIFEdipine (PROCARDIA XL/ADALAT-CC) 90 MG 24 hr tablet Take 90 mg by mouth daily.   Yes Historical Provider, MD  OVER THE COUNTER MEDICATION Take 1 capsule by mouth 2 (two) times daily. "Kidney Factor"  Yes Historical Provider, MD  potassium chloride SA (K-DUR,KLOR-CON) 20 MEQ tablet Take 20 mEq by mouth 2 (two) times daily.   Yes Historical Provider, MD  oxyCODONE-acetaminophen (PERCOCET/ROXICET) 5-325 MG per tablet Take 1 tablet by mouth every 6 (six) hours as needed. Patient not taking: Reported on 02/01/2015 10/18/14   Ulyses Amor, PA-C   BP 170/87 mmHg  Pulse 83  Temp(Src) 98.2 F (36.8 C) (Oral)  Resp 20  Ht 5\' 11"  (1.803 m)  Wt 216 lb (97.977 kg)  BMI 30.14 kg/m2  SpO2 98% Physical Exam  Constitutional: He is oriented to person, place, and time.  Chronically ill   HENT:  Head:  Normocephalic.  Mouth/Throat: Oropharynx is clear and moist.  Eyes: Conjunctivae are normal. Pupils are equal, round, and reactive to light.  Neck: Normal range of motion. Neck supple.  Cardiovascular: Normal rate, regular rhythm and normal heart sounds.   Pulmonary/Chest: Effort normal and breath sounds normal. No respiratory distress. He has no wheezes. He has no rales.  Abdominal: Soft. Bowel sounds are normal. He exhibits no distension. There is no tenderness. There is no rebound.  Musculoskeletal: Normal range of motion. He exhibits no edema or tenderness.  Neurological: He is alert and oriented to person, place, and time.  R facial droop and strength 4/5 R side (baseline). Strength 5/5 L side. Patient able to stand but takes small steps and has trouble with balancing. Mild ataxic gait   Skin: Skin is warm and dry.  Psychiatric: He has a normal mood and affect. His behavior is normal. Judgment and thought content normal.  Nursing note and vitals reviewed.   ED Course  Procedures (including critical care time)  CRITICAL CARE Performed by: Darl Householder, Jahara Dail   Total critical care time: 30 minutes  Critical care time was exclusive of separately billable procedures and treating other patients.  Critical care was necessary to treat or prevent imminent or life-threatening deterioration.  Critical care was time spent personally by me on the following activities: development of treatment plan with patient and/or surrogate as well as nursing, discussions with consultants, evaluation of patient's response to treatment, examination of patient, obtaining history from patient or surrogate, ordering and performing treatments and interventions, ordering and review of laboratory studies, ordering and review of radiographic studies, pulse oximetry and re-evaluation of patient's condition.   Labs Review Labs Reviewed  CBC WITH DIFFERENTIAL/PLATELET - Abnormal; Notable for the following:    RBC 3.47 (*)     Hemoglobin 9.7 (*)    HCT 30.4 (*)    All other components within normal limits  COMPREHENSIVE METABOLIC PANEL - Abnormal; Notable for the following:    Potassium 3.3 (*)    CO2 21 (*)    Glucose, Bld 126 (*)    BUN 72 (*)    Creatinine, Ser 7.35 (*)    Calcium 8.8 (*)    GFR calc non Af Amer 7 (*)    GFR calc Af Amer 8 (*)    All other components within normal limits  CBG MONITORING, ED - Abnormal; Notable for the following:    Glucose-Capillary 133 (*)    All other components within normal limits  I-STAT TROPOININ, ED    Imaging Review Ct Head Wo Contrast  03/17/2015  CLINICAL DATA:  65 year old male with headache x1 month. No injury. Patient presenting with altered mental status and vomiting. EXAM: CT HEAD WITHOUT CONTRAST TECHNIQUE: Contiguous axial images were obtained from the base of the skull through the vertex  without intravenous contrast. COMPARISON:  CT dated 06/29/2014 an MRI dated 03/17/2015 FINDINGS: There is a large right frontoparietal subdural collection with mixed attenuating content. In measures approximately 2.5 cm in greatest transverse axial dimension over the right frontal lobe. This collection primarily demonstrates a lower attenuation likely related to old hematoma/hygroma. Areas of higher density noted layering within this collection compatible with acute or subacute subdural hemorrhage. There is mass effect on the right hemisphere with near complete effacement of the right lateral ventricle approximately 6 mm right-to-left midline shift measured at the level of the foramen Monro. A 3 mm high attenuation along the posterior falx may be artifactual or represent a small parafalcine subdural hemorrhage. No intraventricular hemorrhage identified. There is no intraparenchymal hemorrhage. There is diffuse age-related volume loss. A small old infarct and encephalomalacia noted in the left periventricular white matter/corona radiata. The visualized paranasal sinuses and  mastoid air cells are clear. The calvarium is intact. IMPRESSION: Large right frontoparietal subdural hemorrhage with mixed attenuating blood likely representing acute on chronic hemorrhage. There is mass effect and approximately 6 mm right-to-left midline shift. These results were called by telephone at the time of interpretation on 03/17/2015 at 8:41 pm to Dr. Shirlyn Goltz , who verbally acknowledged these results. Electronically Signed   By: Anner Crete M.D.   On: 03/17/2015 20:48   Mr Brain Wo Contrast  03/17/2015  CLINICAL DATA:  Headache several weeks. Difficulty ambulating. Leg weakness. EXAM: MRI HEAD WITHOUT CONTRAST TECHNIQUE: Multiplanar, multiecho pulse sequences of the brain and surrounding structures were obtained without intravenous contrast. COMPARISON:  CT head 06/29/2014 FINDINGS: Large subdural hematoma on the right. This is largely serum but there are layering blood products dependently. There are septations. 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 There is mass-effect on the right hemisphere with 10 mm midline shift to the left. The fluid collection measures 22 mm in the frontal region and 15 mm in the parietal region. Negative for hydrocephalus. No acute infarct. Chronic infarct in the left deep white matter unchanged from the prior CT. Negative for mass lesion. IMPRESSION: Large right-sided subdural hematoma which appears subacute to chronic. There is mass-effect in midline shift measuring 10 mm. Chronic infarct in the deep white matter on the left. No acute infarct. Critical Value/emergent results were called by telephone at the time of interpretation on 03/17/2015 at 8:40 pm to Dr. Shirlyn Goltz , who verbally acknowledged these results. Electronically Signed   By: Franchot Gallo M.D.   On: 03/17/2015 20:41   I have personally reviewed and evaluated these images and lab results as part of my medical decision-making.   EKG Interpretation   Date/Time:  Friday March 17 2015 17:48:03 EST Ventricular Rate:  73 PR Interval:  178 QRS Duration: 88 QT Interval:  442 QTC Calculation: 486 R Axis:   -41 Text Interpretation:  Normal sinus rhythm Left axis deviation Possible  Anterior infarct , age undetermined Abnormal ECG No significant change  since last tracing Confirmed by Hashim Eichhorst  MD, Erion Hermans ([id number]) on 03/17/2015  6:18:45 PM      MDM   Final diagnoses:  None   Javier Jenkins is a 65 y.o. male here with headaches, unsteady gait. Symptoms for several weeks but worse today. Not sure if he has subacute vs acute stroke. Will get labs, CT head. Will likely need MRI.   8:58 PM CT and MRI showed R large subdural that is subacute but now has midline shift. Patient is definitely  weaker and has trouble walking. Consulted neurosurgery. Discussed with Dr. Ellene Route, who will perform surgery emergently. Given labetalol for BP control.    Wandra Arthurs, MD 03/17/15 2116

## 2015-03-17 NOTE — H&P (Signed)
Javier Jenkins is an 65 y.o. male.   Chief Complaint: headache HPI: patient with an old history of left cva who hit his hwad about 1 month ago and since then he develpoed headache with gait difficulties. In the er he had one episode pf loc and vomiting  Past Medical History  Diagnosis Date  . Hypertension   . Stroke [facility])     right side paralysis  . Hypothyroidism   . Diabetes mellitus without complication ([place])     not on medications  . Chronic kidney disease     stage 5  . Constipation     Past Surgical History  Procedure Laterality Date  . Hemorrhoid surgery    . Av fistula placement Left 10/18/2014    Procedure: ARTERIOVENOUS (AV) FISTULA CREATION;  Surgeon: Angelia Mould, MD;  Location: [facility] OR;  Service: Vascular;  Laterality: Left;    Family History  Problem Relation Age of Onset  . Hypertension Mother    Social History:  reports that he quit smoking about 30 years ago. His smoking use included Pipe. He has never used smokeless tobacco. He reports that he does not drink alcohol or use illicit drugs.  Allergies: No Known Allergies   (Not in a hospital admission)  Results for orders placed or performed during the hospital encounter of 03/17/15 (from the past 48 hour(s))  CBC with Differential     Status: Abnormal   Collection Time: 03/17/15  5:50 PM  Result Value Ref Range   WBC 5.1 4.0 - 10.5 K/uL   RBC 3.47 (L) 4.22 - 5.81 MIL/uL   Hemoglobin 9.7 (L) 13.0 - 17.0 g/dL   HCT 30.4 (L) 39.0 - 52.0 %   MCV 87.6 78.0 - 100.0 fL   MCH 28.0 26.0 - 34.0 pg   MCHC 31.9 30.0 - 36.0 g/dL   RDW 14.0 11.5 - 15.5 %   Platelets 246 150 - 400 K/uL   Neutrophils Relative % 69 %   Neutro Abs 3.6 1.7 - 7.7 K/uL   Lymphocytes Relative 17 %   Lymphs Abs 0.9 0.7 - 4.0 K/uL   Monocytes Relative 8 %   Monocytes Absolute 0.4 0.1 - 1.0 K/uL   Eosinophils Relative 5 %   Eosinophils Absolute 0.2 0.0 - 0.7 K/uL   Basophils Relative 1 %   Basophils Absolute 0.0 0.0 - 0.1  K/uL  Comprehensive metabolic panel     Status: Abnormal   Collection Time: 03/17/15  5:50 PM  Result Value Ref Range   Sodium 137 135 - 145 mmol/L   Potassium 3.3 (L) 3.5 - 5.1 mmol/L   Chloride 101 101 - 111 mmol/L   CO2 21 (L) 22 - 32 mmol/L   Glucose, Bld 126 (H) 65 - 99 mg/dL   BUN 72 (H) 6 - 20 mg/dL   Creatinine, Ser 7.35 (H) 0.61 - 1.24 mg/dL   Calcium 8.8 (L) 8.9 - 10.3 mg/dL   Total Protein 7.8 6.5 - 8.1 g/dL   Albumin 3.8 3.5 - 5.0 g/dL   AST 27 15 - 41 U/L   ALT 23 17 - 63 U/L   Alkaline Phosphatase 44 38 - 126 U/L   Total Bilirubin 0.5 0.3 - 1.2 mg/dL   GFR calc non Af Amer 7 (L) >60 mL/min   GFR calc Af Amer 8 (L) >60 mL/min    Comment: (NOTE) The eGFR has been calculated using the CKD EPI equation. This calculation has not been validated in all  clinical situations. eGFR's persistently <60 mL/min signify possible Chronic Kidney Disease.    Anion gap 15 5 - 15  CBG monitoring, ED     Status: Abnormal   Collection Time: 03/17/15  6:00 PM  Result Value Ref Range   Glucose-Capillary 133 (H) 65 - 99 mg/dL  I-stat troponin, ED     Status: None   Collection Time: 03/17/15  6:35 PM  Result Value Ref Range   Troponin i, poc 0.00 0.00 - 0.08 ng/mL   Comment 3            Comment: Due to the release kinetics of cTnI, a negative result within the first hours of the onset of symptoms does not rule out myocardial infarction with certainty. If myocardial infarction is still suspected, repeat the test at appropriate intervals.    Ct Head Wo Contrast  03/17/2015  CLINICAL DATA:  65 year old male with headache x1 month. No injury. Patient presenting with altered mental status and vomiting. EXAM: CT HEAD WITHOUT CONTRAST TECHNIQUE: Contiguous axial images were obtained from the base of the skull through the vertex without intravenous contrast. COMPARISON:  CT dated 06/29/2014 an MRI dated 03/17/2015 FINDINGS: There is a large right frontoparietal subdural collection with mixed  attenuating content. In measures approximately 2.5 cm in greatest transverse axial dimension over the right frontal lobe. This collection primarily demonstrates a lower attenuation likely related to old hematoma/hygroma. Areas of higher density noted layering within this collection compatible with acute or subacute subdural hemorrhage. There is mass effect on the right hemisphere with near complete effacement of the right lateral ventricle approximately 6 mm right-to-left midline shift measured at the level of the foramen Monro. A 3 mm high attenuation along the posterior falx may be artifactual or represent a small parafalcine subdural hemorrhage. No intraventricular hemorrhage identified. There is no intraparenchymal hemorrhage. There is diffuse age-related volume loss. A small old infarct and encephalomalacia noted in the left periventricular white matter/corona radiata. The visualized paranasal sinuses and mastoid air cells are clear. The calvarium is intact. IMPRESSION: Large right frontoparietal subdural hemorrhage with mixed attenuating blood likely representing acute on chronic hemorrhage. There is mass effect and approximately 6 mm right-to-left midline shift. These results were called by telephone at the time of interpretation on 03/17/2015 at 8:41 pm to Dr. Shirlyn Goltz , who verbally acknowledged these results. Electronically Signed   By: Anner Crete M.D.   On: 03/17/2015 20:48   Mr Brain Wo Contrast  03/17/2015  CLINICAL DATA:  Headache several weeks. Difficulty ambulating. Leg weakness. EXAM: MRI HEAD WITHOUT CONTRAST TECHNIQUE: Multiplanar, multiecho pulse sequences of the brain and surrounding structures were obtained without intravenous contrast. COMPARISON:  CT head 06/29/2014 FINDINGS: Large subdural hematoma on the right. This is largely serum but there are layering blood products dependently. There are septations. 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 There is  mass-effect on the right hemisphere with 10 mm midline shift to the left. The fluid collection measures 22 mm in the frontal region and 15 mm in the parietal region. Negative for hydrocephalus. No acute infarct. Chronic infarct in the left deep white matter unchanged from the prior CT. Negative for mass lesion. IMPRESSION: Large right-sided subdural hematoma which appears subacute to chronic. There is mass-effect in midline shift measuring 10 mm. Chronic infarct in the deep white matter on the left. No acute infarct. Critical Value/emergent results were called by telephone at the time of interpretation on 03/17/2015 at 8:40 pm to Dr.  DAVID YAO , who verbally acknowledged these results. Electronically Signed   By: Franchot Gallo M.D.   On: 03/17/2015 20:41    Review of Systems  Constitutional: Negative.   Eyes: Negative.   Respiratory: Negative.   Cardiovascular: Negative.   Gastrointestinal: Negative.   Genitourinary: Negative.   Musculoskeletal: Negative.   Skin: Negative.   Neurological: Positive for focal weakness and headaches.  Endo/Heme/Allergies: Negative.   Psychiatric/Behavioral: Negative.     Blood pressure 163/95, pulse 81, temperature 98.2 F (36.8 C), temperature source Oral, resp. rate 20, height '5\' 11"'  (1.803 m), weight 97.977 kg (216 lb), SpO2 96 %. Physical Exam  Hent, nl. Neck, mild stiffness, cv, nl. Lugs, clear. Abdomen, nl. Extremities, see neuro.NEURO awake, f/c/ weakness of right side with contraction arm worse than left. Ct chronic and subacute sdh with shift right to left Assessment/Plan i did talk with family and patient. Will take him for right side burr hole(S) FOR EVACUATION OF SDH. THEY ARE AWARE OF RISKS AND BENEFITS  Floyce Stakes, MD 03/17/2015, 10:06 PM

## 2015-03-17 NOTE — ED Notes (Signed)
critcial radiology report, dr Darl Householder notifed.

## 2015-03-17 NOTE — ED Notes (Signed)
Dr. Joya Salm at bedside.

## 2015-03-18 LAB — GLUCOSE, CAPILLARY
GLUCOSE-CAPILLARY: 96 mg/dL (ref 65–99)
GLUCOSE-CAPILLARY: 80 mg/dL (ref 65–99)
GLUCOSE-CAPILLARY: 127 mg/dL — AB (ref 65–99)
GLUCOSE-CAPILLARY: 158 mg/dL — AB (ref 65–99)
Glucose-Capillary: 105 mg/dL — ABNORMAL HIGH (ref 65–99)
Glucose-Capillary: 145 mg/dL — ABNORMAL HIGH (ref 65–99)

## 2015-03-18 LAB — MRSA PCR SCREENING: MRSA by PCR: NEGATIVE

## 2015-03-18 MED ORDER — HYDROCODONE-ACETAMINOPHEN 5-325 MG PO TABS
1.0000 | ORAL_TABLET | ORAL | Status: DC | PRN
  Administered 2015-03-18: 1 via ORAL
  Filled 2015-03-18: qty 1

## 2015-03-18 MED ORDER — LABETALOL HCL 5 MG/ML IV SOLN
10.0000 mg | INTRAVENOUS | Status: DC | PRN
  Administered 2015-03-18: 40 mg via INTRAVENOUS
  Administered 2015-03-18: 30 mg via INTRAVENOUS
  Administered 2015-03-18: 20 mg via INTRAVENOUS
  Administered 2015-03-18: 30 mg via INTRAVENOUS
  Administered 2015-03-19 (×4): 40 mg via INTRAVENOUS
  Filled 2015-03-18: qty 8
  Filled 2015-03-18: qty 4
  Filled 2015-03-18 (×6): qty 8
  Filled 2015-03-18: qty 4

## 2015-03-18 MED ORDER — MORPHINE SULFATE (PF) 2 MG/ML IV SOLN: 1.0000 mg | INTRAVENOUS | Status: DC | PRN

## 2015-03-18 MED ORDER — ONDANSETRON HCL 4 MG PO TABS
4.0000 mg | ORAL_TABLET | ORAL | Status: DC | PRN
Start: 2015-03-18 — End: 2015-03-23

## 2015-03-18 MED ORDER — CEFAZOLIN SODIUM 1-5 GM-% IV SOLN
1.0000 g | Freq: Three times a day (TID) | INTRAVENOUS | Status: AC
  Administered 2015-03-18 (×2): 1 g via INTRAVENOUS
  Filled 2015-03-18 (×2): qty 50

## 2015-03-18 MED ORDER — CETYLPYRIDINIUM CHLORIDE 0.05 % MT LIQD: 7.0000 mL | Freq: Two times a day (BID) | OROMUCOSAL | Status: DC

## 2015-03-18 MED ORDER — SODIUM CHLORIDE 0.9 % IV SOLN
500.0000 mg | Freq: Two times a day (BID) | INTRAVENOUS | Status: DC
  Administered 2015-03-18 (×3): 500 mg via INTRAVENOUS
  Filled 2015-03-18 (×5): qty 5

## 2015-03-18 MED ORDER — PROMETHAZINE HCL 25 MG PO TABS: 12.5000 mg | ORAL_TABLET | ORAL | Status: DC | PRN

## 2015-03-18 MED ORDER — SODIUM CHLORIDE 0.9 % IV SOLN
INTRAVENOUS | Status: DC
  Administered 2015-03-18: 01:00:00 via INTRAVENOUS

## 2015-03-18 MED ORDER — PANTOPRAZOLE SODIUM 40 MG IV SOLR
40.0000 mg | Freq: Every day | INTRAVENOUS | Status: DC
  Administered 2015-03-18 (×2): 40 mg via INTRAVENOUS
  Filled 2015-03-18 (×2): qty 40

## 2015-03-18 MED ORDER — ONDANSETRON HCL 4 MG/2ML IJ SOLN
4.0000 mg | INTRAMUSCULAR | Status: DC | PRN
  Administered 2015-03-18: 4 mg via INTRAVENOUS
  Filled 2015-03-18: qty 2

## 2015-03-18 MED ORDER — CHLORHEXIDINE GLUCONATE 0.12 % MT SOLN
15.0000 mL | Freq: Two times a day (BID) | OROMUCOSAL | Status: DC
  Administered 2015-03-18: 15 mL via OROMUCOSAL

## 2015-03-18 NOTE — Anesthesia Postprocedure Evaluation (Signed)
Anesthesia Post Note  Patient: Javier Jenkins  Procedure(s) Performed: Procedure(s) (LRB): BURR HOLES FOR RIGHT SUBDURAL HEMATOMA (Right)  Patient location during evaluation: PACU Anesthesia Type: General Level of consciousness: sedated Pain management: pain level controlled Vital Signs Assessment: post-procedure vital signs reviewed and stable Respiratory status: spontaneous breathing and respiratory function stable Cardiovascular status: stable Anesthetic complications: no    Last Vitals:  Filed Vitals:   03/18/15 0200 03/18/15 0300  BP: 163/101 151/100  Pulse: 75 78  Temp:    Resp: 16 33    Last Pain:  Filed Vitals:   03/18/15 0301  PainSc: 0-No pain                 Aja Bolander DANIEL

## 2015-03-18 NOTE — Op Note (Signed)
NAMEJAMEL, WINKER NO.:  [id number]  MEDICAL RECORD NO.:  [id number]  LOCATION:  3M05C                        FACILITY:  [place]  PHYSICIAN:  Leeroy Cha, M.D.   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  PROCEDURE:  Right frontal burr hole with a reclusive of the chronic and subacute subdural hematoma.  CLINICAL HISTORY:  The patient was admitted through the emergency room after he complained of headache.  He fell about a month ago and the family had noticed some difficulty walking.  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  CT scan showed subdural hematoma with shift from right to left.  Surgery was advised. The family knew the risk including the need for further surgery, seizure, stroke, infection.  PROCEDURE IN DETAIL:  The patient was taken to the OR, and after intubation, the right side of the head was prepped with ChloraPrep. Drapes were applied.  We chose an area right in front of the coronal suture and away from the midline.  Incision was made on the scalp and retraction of the scalp was made.  Then, using the craniotome, we made a small burr hole of approximately 3 x 3 cm.  The dura mater was opened and immediately high pressure chronic blood came into the area.  There was subacute blood.  We inserted a pediatric feeding catheter.  We did a total __________ of the hematoma.  The brain at the end was pulsatile. Then, we left a drain in the subdural space which was brought through a separate  incision.  The wound was closed with Vicryl and staples.  The patient woke up, talking, moving as preop.  He would be going to the ICU for the next 48 hours.          ______________________________ Leeroy Cha, M.D.     EB/MEDQ  D:  03/18/2015  T:  03/18/2015  Job:  [id number]

## 2015-03-18 NOTE — Progress Notes (Signed)
Patient ID: Javier Jenkins, male   DOB: 1950/07/28, 65 y.o.   MRN: [id number] Awake, no weakness in left sidem old hemiparesis in right. Drain working

## 2015-03-18 NOTE — Progress Notes (Signed)
Pt given pain meds for slight HA. Began vomiting ~10 min after. Neuro assessment unchanged. Zofran given IV. Will cont to monitor.

## 2015-03-19 LAB — GLUCOSE, CAPILLARY
GLUCOSE-CAPILLARY: 134 mg/dL — AB (ref 65–99)
GLUCOSE-CAPILLARY: 94 mg/dL (ref 65–99)
GLUCOSE-CAPILLARY: 155 mg/dL — AB (ref 65–99)
GLUCOSE-CAPILLARY: 151 mg/dL — AB (ref 65–99)
Glucose-Capillary: 110 mg/dL — ABNORMAL HIGH (ref 65–99)
Glucose-Capillary: 113 mg/dL — ABNORMAL HIGH (ref 65–99)
Glucose-Capillary: 123 mg/dL — ABNORMAL HIGH (ref 65–99)

## 2015-03-19 MED ORDER — PANTOPRAZOLE SODIUM 40 MG PO TBEC
40.0000 mg | DELAYED_RELEASE_TABLET | Freq: Every day | ORAL | Status: DC
  Administered 2015-03-19 – 2015-03-22 (×4): 40 mg via ORAL
  Filled 2015-03-19 (×4): qty 1

## 2015-03-19 MED ORDER — LEVETIRACETAM ER 500 MG PO TB24
500.0000 mg | ORAL_TABLET | Freq: Every day | ORAL | Status: DC
  Administered 2015-03-19 – 2015-03-23 (×5): 500 mg via ORAL
  Filled 2015-03-19 (×6): qty 1

## 2015-03-19 MED ORDER — NICARDIPINE HCL IN NACL 20-0.86 MG/200ML-% IV SOLN
3.0000 mg/h | INTRAVENOUS | Status: DC
  Administered 2015-03-19: 5 mg/h via INTRAVENOUS

## 2015-03-19 MED ORDER — NICARDIPINE HCL IN NACL 20-0.86 MG/200ML-% IV SOLN
INTRAVENOUS | Status: AC
  Filled 2015-03-19: qty 200

## 2015-03-19 NOTE — Progress Notes (Addendum)
Patient ID: Javier Jenkins, male   DOB: 05-Oct-1950, 65 y.o.   MRN: [id number] Vital signs are stable. Has had some hypertension requiring intermittent Cardene Subdural drain with moderate output Still has some residual weakness Will had PT and OT CT in morning

## 2015-03-20 ENCOUNTER — Inpatient Hospital Stay (HOSPITAL_COMMUNITY): Payer: PRIVATE HEALTH INSURANCE

## 2015-03-20 ENCOUNTER — Encounter (HOSPITAL_COMMUNITY): Payer: Self-pay | Admitting: Neurosurgery

## 2015-03-20 LAB — GLUCOSE, CAPILLARY
GLUCOSE-CAPILLARY: 119 mg/dL — AB (ref 65–99)
GLUCOSE-CAPILLARY: 86 mg/dL (ref 65–99)
GLUCOSE-CAPILLARY: 163 mg/dL — AB (ref 65–99)
GLUCOSE-CAPILLARY: 142 mg/dL — AB (ref 65–99)
Glucose-Capillary: 117 mg/dL — ABNORMAL HIGH (ref 65–99)
Glucose-Capillary: 99 mg/dL (ref 65–99)

## 2015-03-20 NOTE — Evaluation (Signed)
Occupational Therapy Evaluation Patient Details Name: Javier Jenkins MRN: [id number] DOB: Javier Jenkins Today's Date: 03/20/2015    History of Present Illness pt presents with R Frontotemporal Parietal SDH s/p [facility] with drain placed.  pt with hx of CVA, DM, HTN, and CKD with L AV Fistula.     Clinical Impression   Pt admitted with above. He demonstrates the below listed deficits and will benefit from continued OT to maximize safety and independence with BADLs.  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  Currently, he requires min guard - min A for ADLs.  Wife is supportive and able to provide 24 hour assist as needed. Anticipate good progress.  Will follow acutely.       Follow Up Recommendations  No OT follow up;Supervision/Assistance - 24 hour    Equipment Recommendations  None recommended by OT    Recommendations for Other Services       Precautions / Restrictions Precautions Precautions: Fall Required Braces or Orthoses: Other Brace/Splint Other Brace/Splint: R AFO      Mobility Bed Mobility                  Transfers Overall transfer level: Needs assistance Equipment used: None Transfers: Sit to/from Stand;Stand Pivot Transfers Sit to Stand: Min guard Stand pivot transfers: Min guard            Balance     Sitting balance-Leahy Scale: Good       Standing balance-Leahy Scale: Fair                              ADL Overall ADL's : Needs assistance/impaired Eating/Feeding: Set up;Sitting   Grooming: Wash/dry hands;Wash/dry face;Oral care;Brushing hair;Min guard;Standing   Upper Body Bathing: Minimal assitance;Sitting   Lower Body Bathing: Minimal assistance;Sit to/from stand   Upper Body Dressing : Minimal assistance;Sitting   Lower Body Dressing: Minimal assistance;Sit to/from stand   Toilet Transfer: Min guard;Ambulation;Comfort height toilet   Toileting-  Clothing Manipulation and Hygiene: Minimal assistance;Sit to/from stand       Functional mobility during ADLs: Min guard       Vision Vision Assessment?: Yes Eye Alignment: Within Functional Limits Tracking/Visual Pursuits: Able to track stimulus in all quads without difficulty Visual Fields: No apparent deficits Additional Comments: Pt able to read without difficulty    Perception Perception Perception Tested?: Yes   Praxis Praxis Praxis tested?: Within functional limits    Pertinent Vitals/Pain Pain Assessment: No/denies pain     Hand Dominance Right (but now uses Lt UE dominantly )   Extremity/Trunk Assessment Upper Extremity Assessment Upper Extremity Assessment: RUE deficits/detail RUE Deficits / Details: Pt with Rt hemiparesis with long standing flexion contractures Rt UE.  Pt reports Rt UE unchanged  RUE Coordination: decreased fine motor;decreased gross motor   Lower Extremity Assessment Lower Extremity Assessment: Defer to PT evaluation   Cervical / Trunk Assessment Cervical / Trunk Assessment: Other exceptions Cervical / Trunk Exceptions: Mild R rotation since old CVA.     Communication Communication Communication: No difficulties   Cognition Arousal/Alertness: Awake/alert Behavior During Therapy: WFL for tasks assessed/performed Overall Cognitive Status: Within Functional Limits for tasks assessed                     General Comments       Exercises       Shoulder Instructions  Home Living Family/patient expects to be discharged to:: Private residence Living Arrangements: Spouse/significant other Available Help at Discharge: Family;Available 24 hours/day Type of Home: House Home Access: Stairs to enter [employer] of Steps: 3 Entrance Stairs-Rails: Right Home Layout: One level     Bathroom Shower/Tub: Occupational psychologist: Standard     Home Equipment: Cane - single point          Prior  Functioning/Environment Level of Independence: Needs assistance  Gait / Transfers Assistance Needed: Independent with ambulation.   ADL's / Homemaking Assistance Needed: pt able to perform ADLs after wife sets up for him.  Wife cuts food for pt, but pt self-feeds.  pt's wife performs homemaking tasks.     Comments: Pt works full time as a Radio producer Diagnosis: Generalized weakness;Hemiplegia dominant side   OT Problem List: Decreased strength;Decreased activity tolerance;Impaired balance (sitting and/or standing);Decreased coordination;Decreased knowledge of use of DME or AE;Impaired UE functional use   OT Treatment/Interventions: Self-care/ADL training;Neuromuscular education;DME and/or AE instruction;Therapeutic activities;Patient/family education;Balance training    OT Goals(Current goals can be found in the care plan section) Acute Rehab OT Goals Patient Stated Goal: Back home. OT Goal Formulation: With patient Time For Goal Achievement: 04/03/15 Potential to Achieve Goals: Good ADL Goals Pt Will Perform Grooming: with modified independence;standing Pt Will Perform Upper Body Bathing: with modified independence;sitting;standing Pt Will Perform Lower Body Bathing: with modified independence;sit to/from stand Pt Will Perform Upper Body Dressing: with modified independence;sitting Pt Will Perform Lower Body Dressing: with modified independence;sit to/from stand Pt Will Transfer to Toilet: with modified independence;ambulating;regular height toilet;bedside commode;grab bars Pt Will Perform Toileting - Clothing Manipulation and hygiene: with modified independence;sit to/from stand  OT Frequency: Min 2X/week   Barriers to D/C:            Co-evaluation              End of Session Nurse Communication: Mobility status  Activity Tolerance: Patient tolerated treatment well Patient left: in chair;with call bell/phone within reach   Time: [id number] OT Time Calculation  (min): 22 min Charges:  OT General Charges $OT Visit: 1 Procedure OT Evaluation $OT Eval Moderate Complexity: 1 Procedure G-Codes:    Armistead Sult M 07-Apr-2015, 7:09 PM

## 2015-03-20 NOTE — Evaluation (Signed)
Physical Therapy Evaluation Patient Details Name: JAZIER HANDKE MRN: [id number] DOB: 10/13/1950 Today's Date: 03/20/2015   History of Present Illness  pt presents with R Frontotemporal Parietal SDH s/p [facility] with drain placed.  pt with hx of CVA, DM, HTN, and CKD with L AV Fistula.    Clinical Impression  Pt eager for mobility and returning to home.  Pt with mild balance deficits, but difficult to tell how much of this is premorbid from old CVA vs new deficits.  Pt indicates he has a cane at home and advised him to start using it.  Difficult to get an exact answer from pt about how often he is falling at home, but he does indicate this fall 1 month ago is not his only fall.  Feel pt would benefit from HHPT for home safety eval and balance activities.  Will continue to follow.      Follow Up Recommendations Home health PT;Supervision for mobility/OOB    Equipment Recommendations  None recommended by PT    Recommendations for Other Services       Precautions / Restrictions Precautions Precautions: Fall Required Braces or Orthoses: Other Brace/Splint Other Brace/Splint: R AFO Restrictions Weight Bearing Restrictions: No      Mobility  Bed Mobility Overal bed mobility: Needs Assistance Bed Mobility: Supine to Sit     Supine to sit: Min assist     General bed mobility comments: pt needs increased time and effort, but is able to complete with only MinA for bringing trunk up to sitting.    Transfers Overall transfer level: Needs assistance Equipment used: None Transfers: Sit to/from Stand Sit to Stand: Min guard         General transfer comment: pt with heavy reliance on L side and leans to L when coming to stand.    Ambulation/Gait Ambulation/Gait assistance: Min assist Ambulation Distance (Feet): 60 Feet Assistive device: None Gait Pattern/deviations: Step-to pattern;Decreased step length - left;Decreased stance time - right;Decreased stride length;Decreased  dorsiflexion - right;Decreased weight shift to right     General Gait Details: pt with hemiparetic gait and wears AFO during ambulation.  pt had 3 small LOB when R shoe unable to clear the floor during swing phase requiring MinA to maintain balance.  Noted L knee instability during gait, which pt indicates is normal for him.    Stairs            Wheelchair Mobility    Modified Rankin (Stroke Patients Only) Modified Rankin (Stroke Patients Only) Pre-Morbid Rankin Score: Moderate disability Modified Rankin: Moderately severe disability     Balance Overall balance assessment: Needs assistance;History of Falls Sitting-balance support: No upper extremity supported;Feet supported Sitting balance-Leahy Scale: Good     Standing balance support: No upper extremity supported;During functional activity Standing balance-Leahy Scale: Fair                               Pertinent Vitals/Pain Pain Assessment: No/denies pain    Home Living Family/patient expects to be discharged to:: Private residence Living Arrangements: Spouse/significant other Available Help at Discharge: Family;Available 24 hours/day Type of Home: House Home Access: Stairs to enter Entrance Stairs-Rails: Right Entrance Stairs-Number of Steps: 3 Home Layout: One level Home Equipment: Cane - single point      Prior Function Level of Independence: Needs assistance   Gait / Transfers Assistance Needed: Independent with ambulation.    ADL's / Homemaking Assistance Needed: pt  able to perform ADLs after wife sets up for him.  Wife cuts food for pt, but pt self-feeds.  pt's wife performs homemaking tasks.          Hand Dominance        Extremity/Trunk Assessment   Upper Extremity Assessment: Defer to OT evaluation           Lower Extremity Assessment: Generalized weakness;RLE deficits/detail RLE Deficits / Details: pt with overall decreased AROM and strength from old CVA.  Minimal  dorsiflexion and knee/hip grossly 3/5.  Sensation intact.    Cervical / Trunk Assessment: Other exceptions  Communication   Communication: No difficulties  Cognition Arousal/Alertness: Awake/alert Behavior During Therapy: WFL for tasks assessed/performed Overall Cognitive Status: Within Functional Limits for tasks assessed                      General Comments      Exercises        Assessment/Plan    PT Assessment Patient needs continued PT services  PT Diagnosis Difficulty walking;Generalized weakness   PT Problem List Decreased strength;Decreased activity tolerance;Decreased balance;Decreased mobility;Decreased coordination;Decreased knowledge of use of DME  PT Treatment Interventions DME instruction;Gait training;Stair training;Functional mobility training;Therapeutic activities;Therapeutic exercise;Balance training;Neuromuscular re-education;Patient/family education   PT Goals (Current goals can be found in the Care Plan section) Acute Rehab PT Goals Patient Stated Goal: Back home. PT Goal Formulation: With patient Time For Goal Achievement: 04/03/15 Potential to Achieve Goals: Good    Frequency Min 4X/week   Barriers to discharge        Co-evaluation               End of Session Equipment Utilized During Treatment: Gait belt Activity Tolerance: Patient tolerated treatment well Patient left: in chair;with call bell/phone within reach Nurse Communication: Mobility status         Time: [id number] PT Time Calculation (min) (ACUTE ONLY): 19 min   Charges:   PT Evaluation $PT Eval Moderate Complexity: 1 Procedure     PT G CodesCatarina Hartshorn, [place] 03/20/2015, 10:06 AM

## 2015-03-20 NOTE — Care Management Important Message (Signed)
Important Message  Patient Details  Name: Javier Jenkins MRN: [id number] Date of Birth: [birth year]   Medicare Important Message Given:  Yes    Loann Quill 03/20/2015, 11:16 AM

## 2015-03-21 LAB — GLUCOSE, CAPILLARY
GLUCOSE-CAPILLARY: 107 mg/dL — AB (ref 65–99)
GLUCOSE-CAPILLARY: 92 mg/dL (ref 65–99)
Glucose-Capillary: 148 mg/dL — ABNORMAL HIGH (ref 65–99)
Glucose-Capillary: 150 mg/dL — ABNORMAL HIGH (ref 65–99)

## 2015-03-21 NOTE — Care Management Note (Addendum)
Case Management Note  Patient Details  Name: Javier Jenkins MRN: [id number] Date of Birth: [birth year]  Subjective/Objective:    Pt admitted on 03/17/15 with SDH s/p [facility] procedure for evacuation.  PTA, pt independent, lives with spouse.                  Action/Plan: PT recommending home health follow up at dc with 24hr supervision.   Wife states pt will have 24h care at discharge.  Will follow as pt progresses.    Expected Discharge Date:                  Expected Discharge Plan:     In-House Referral:     Discharge planning Services   CM Consult  Post Acute Care Choice:    Choice offered to:     DME Arranged:    DME Agency:     HH Arranged:    HH Agency:     Status of Service:   In process, will continue to follow  Medicare Important Message Given:  Yes Date Medicare IM Given:    Medicare IM give by:    Date Additional Medicare IM Given:    Additional Medicare Important Message give by:     If discussed at [place] of Stay Meetings, dates discussed:    Additional Comments:  Reinaldo Raddle, RN, BSN  Trauma/Neuro ICU Case Manager [phone]

## 2015-03-21 NOTE — Progress Notes (Signed)
Patient ID: Javier Jenkins, male   DOB: 03-Mar-1950, 65 y.o.   MRN: [id number] Doing well. Drain out. Ambulating with help. To the floor and discharge in 24 - 48 hours

## 2015-03-21 NOTE — Progress Notes (Signed)
Physical Therapy Treatment Patient Details Name: KAMARRION SCHOESSOW MRN: [id number] DOB: [birth year] Today's Date: 03/21/2015    History of Present Illness pt presents with R Frontotemporal Parietal SDH s/p [facility] with drain placed.  pt with hx of CVA, DM, HTN, and CKD with L AV Fistula.      PT Comments    Pt able to increase his ambulation distance today with less LOB.  Pt educated on continued mobility at home and home safety.  Will continue to follow while on acute.    Follow Up Recommendations  Home health PT;Supervision for mobility/OOB     Equipment Recommendations  None recommended by PT    Recommendations for Other Services       Precautions / Restrictions Precautions Precautions: Fall Required Braces or Orthoses: Other Brace/Splint Other Brace/Splint: R AFO Restrictions Weight Bearing Restrictions: No    Mobility  Bed Mobility               General bed mobility comments: pt sitting in recliner.  Transfers Overall transfer level: Needs assistance Equipment used: None Transfers: Sit to/from Stand Sit to Stand: Supervision         General transfer comment: pt continues to lean to L side over his strong LE, but demonstrates good safety.    Ambulation/Gait Ambulation/Gait assistance: Supervision Ambulation Distance (Feet): 150 Feet Assistive device: None Gait Pattern/deviations: Step-to pattern;Decreased step length - left;Decreased stance time - right;Decreased dorsiflexion - right;Decreased weight shift to right     General Gait Details: pt continues to have difficulty clearing R foot during stance, but with only one LOB today and pt able to self-correct.     Stairs            Wheelchair Mobility    Modified Rankin (Stroke Patients Only) Modified Rankin (Stroke Patients Only) Pre-Morbid Rankin Score: Moderate disability Modified Rankin: Moderately severe disability     Balance Overall balance assessment: Needs assistance;History  of Falls         Standing balance support: No upper extremity supported;During functional activity Standing balance-Leahy Scale: Fair                      Cognition Arousal/Alertness: Awake/alert Behavior During Therapy: WFL for tasks assessed/performed Overall Cognitive Status: Within Functional Limits for tasks assessed                      Exercises      General Comments        Pertinent Vitals/Pain Pain Assessment: No/denies pain    Home Living                      Prior Function            PT Goals (current goals can now be found in the care plan section) Acute Rehab PT Goals Patient Stated Goal: Back home. PT Goal Formulation: With patient Time For Goal Achievement: 04/03/15 Potential to Achieve Goals: Good Progress towards PT goals: Progressing toward goals    Frequency  Min 4X/week    PT Plan Current plan remains appropriate    Co-evaluation             End of Session Equipment Utilized During Treatment: Gait belt Activity Tolerance: Patient tolerated treatment well Patient left: in chair;with call bell/phone within reach     Time: [id number] PT Time Calculation (min) (ACUTE ONLY): 28 min  Charges:  $Gait Training: 23-37 mins  G CodesCatarina Hartshorn, [place] 03/21/2015, 1:27 PM

## 2015-03-22 ENCOUNTER — Inpatient Hospital Stay (HOSPITAL_COMMUNITY): Payer: PRIVATE HEALTH INSURANCE

## 2015-03-22 NOTE — Progress Notes (Signed)
Physical Therapy Treatment Patient Details Name: Javier Jenkins MRN: [id number] DOB: 07/22/1950 Today's Date: 03/22/2015    History of Present Illness pt presents with R Frontotemporal Parietal SDH s/p [facility] with drain placed.  pt with hx of CVA, DM, HTN, and CKD with L AV Fistula.      PT Comments    Pt is slowly progressing with mobility. Pt requires increased time and effort to ambulate. Feel pt would benefit from using a quad cane at home for improved safety. Pt's wife was present during the session. Reinforced the need for supervision with mobility. Recommend d/c home when medically stable and follow-up HHPT.   Follow Up Recommendations  Home health PT;Supervision for mobility/OOB     Equipment Recommendations  None recommended by PT    Recommendations for Other Services       Precautions / Restrictions Precautions Precautions: Fall Required Braces or Orthoses: Other Brace/Splint Other Brace/Splint: R AFO Restrictions Weight Bearing Restrictions: No    Mobility  Bed Mobility               General bed mobility comments: pt sitting in recliner.  Transfers Overall transfer level: Needs assistance Equipment used: None Transfers: Sit to/from Stand Sit to Stand: Min guard         General transfer comment: increased effort and definite need of left hand to balance self.  Ambulation/Gait Ambulation/Gait assistance: Min guard Ambulation Distance (Feet): 170 Feet Assistive device: None Gait Pattern/deviations: Decreased step length - left;Decreased stride length;Decreased weight shift to right;Step-to pattern   Gait velocity interpretation: <1.8 ft/sec, indicative of risk for recurrent falls General Gait Details: Pt very slow and ambulated with increased effort. Pt did not have a LOB, but had difficulty weight shifting over R LE and difficulty advancing R leg during the swing phase. Pt would benefit from the use of a quad cane for improved safety with  gait.   Stairs            Wheelchair Mobility    Modified Rankin (Stroke Patients Only)       Balance Overall balance assessment: Needs assistance Sitting-balance support: No upper extremity supported Sitting balance-Leahy Scale: Good     Standing balance support: No upper extremity supported Standing balance-Leahy Scale: Fair                      Cognition Arousal/Alertness: Awake/alert Behavior During Therapy: WFL for tasks assessed/performed Overall Cognitive Status: Within Functional Limits for tasks assessed                      Exercises      General Comments General comments (skin integrity, edema, etc.): Head incision was draining. RN notified.      Pertinent Vitals/Pain Pain Assessment: No/denies pain    Home Living                      Prior Function            PT Goals (current goals can now be found in the care plan section) Progress towards PT goals: Progressing toward goals    Frequency  Min 4X/week    PT Plan Current plan remains appropriate    Co-evaluation             End of Session Equipment Utilized During Treatment: Gait belt Activity Tolerance: Patient tolerated treatment well Patient left: with call bell/phone within reach;in bed;with family/visitor present  Time: 1040-1107 PT Time Calculation (min) (ACUTE ONLY): 27 min  Charges:  $Gait Training: 23-37 mins                    G Codes:      Javier Jenkins 03/22/2015, 11:15 AM

## 2015-03-22 NOTE — Progress Notes (Signed)
Patient ID: Javier Jenkins, male   DOB: [birth year], 65 y.o.   MRN: [id number] Neuro stable, no headache, same level of energy. Still some drainage

## 2015-03-22 NOTE — Progress Notes (Signed)
Upon rounding, pt's surgical noted with large amount of serosanguineous drainage. Incision with staples, open to air at this time. Patient up in chair at this time. Gauze/hyperfix taped covered. Patient denied pain or any discomfort. Prefers to sit up at this time. Right shoe applied d/t foot drop. Will continue to monitor.   Ave Filter, RN

## 2015-03-22 NOTE — Progress Notes (Signed)
Spoke with Dr. Joya Salm r/t draining of incision. MD will follow up.  Ave Filter, RN

## 2015-03-22 NOTE — Progress Notes (Signed)
OT Cancellation Note  Patient Details Name: TRE BARCELLONA MRN: [id number] DOB: 01-31-1950   Cancelled Treatment:    Reason Eval/Treat Not Completed: Patient declined, no reason specified - pt just started eating dinner and requested OT come back tomorrow. Will attempt to see tomorrow if time allows.  Redmond Baseman, OTR/L Pager: [phone] 03/22/2015, 4:58 PM

## 2015-03-23 NOTE — Progress Notes (Signed)
Pt incision draining copious amount of serosanguinous fluid with foul odor. RN changed dressing that consisted of 6 4x4 gauze and full curlex, twice in 12 hour period. Incision open to air. Pt denies any pain, no change in mental status. RN will continue to monitor.

## 2015-03-23 NOTE — Progress Notes (Signed)
Patient ID: Javier Jenkins, male   DOB: 09/22/1950, 64 y.o.   MRN: [id number] Ct head better, off and drain. Neuro stable. Wants to go home

## 2015-03-23 NOTE — Progress Notes (Signed)
Physical Therapy Treatment Patient Details Name: Javier Jenkins MRN: [id number] DOB: 1950-06-02 Today's Date: 04-19-2015    History of Present Illness pt presents with R Frontotemporal Parietal SDH s/p [facility] with drain placed.  pt with hx of CVA, DM, HTN, and CKD with L AV Fistula.      PT Comments    Pt making good progress with mobility. Used straight cane but found pt actually has quad cane at home.  Follow Up Recommendations  Home health PT;Supervision for mobility/OOB     Equipment Recommendations  None recommended by PT    Recommendations for Other Services       Precautions / Restrictions Precautions Precautions: Fall Required Braces or Orthoses: Other Brace/Splint Other Brace/Splint: R AFO Restrictions Weight Bearing Restrictions: No    Mobility  Bed Mobility               General bed mobility comments: Pt sitting EOB  Transfers Overall transfer level: Needs assistance Equipment used: Straight cane Transfers: Sit to/from Stand Sit to Stand: Supervision            Ambulation/Gait Ambulation/Gait assistance: Supervision Ambulation Distance (Feet): 200 Feet Assistive device: None Gait Pattern/deviations: Step-to pattern;Decreased step length - left;Decreased dorsiflexion - right;Decreased weight shift to right Gait velocity: decr Gait velocity interpretation: Below normal speed for age/gender General Gait Details: Pt with hip hike on rt to help clear RLE. No loss of balance. Incr stability using cane.   Stairs            Wheelchair Mobility    Modified Rankin (Stroke Patients Only) Modified Rankin (Stroke Patients Only) Pre-Morbid Rankin Score: Moderate disability Modified Rankin: Moderately severe disability     Balance Overall balance assessment: Needs assistance Sitting-balance support: No upper extremity supported Sitting balance-Leahy Scale: Good     Standing balance support: No upper extremity supported Standing  balance-Leahy Scale: Fair                      Cognition Arousal/Alertness: Awake/alert Behavior During Therapy: WFL for tasks assessed/performed Overall Cognitive Status: Within Functional Limits for tasks assessed                      Exercises      General Comments        Pertinent Vitals/Pain Pain Assessment: No/denies pain    Home Living                      Prior Function            PT Goals (current goals can now be found in the care plan section) Progress towards PT goals: Progressing toward goals    Frequency  Min 4X/week    PT Plan Current plan remains appropriate    Co-evaluation             End of Session Equipment Utilized During Treatment: Gait belt Activity Tolerance: Patient tolerated treatment well Patient left: with call bell/phone within reach;in chair;with chair alarm set     Time: [phone] PT Time Calculation (min) (ACUTE ONLY): 17 min  Charges:  $Gait Training: 8-22 mins                    G Codes:      Jirah Rider Apr 19, 2015, 9:17 AM [facility] PT [phone]

## 2015-03-23 NOTE — Care Management Important Message (Signed)
Important Message  Patient Details  Name: Javier Jenkins MRN: [id number] Date of Birth: August 27, 1950   Medicare Important Message Given:  Yes    [name] [name] 03/23/2015, 4:33 PM

## 2015-03-23 NOTE — Discharge Summary (Signed)
Physician Discharge Summary  Patient ID: Javier Jenkins MRN: [id number] DOB/AGE: [birth year] 65 y.o.  Admit date: 03/17/2015 Discharge date: 03/23/2015  Admission Diagnoses:subdural hematoma Discharge Diagnoses:  Active Problems:   Subdural hematoma (HCC)   Discharged Condition: no headache  Hospital Course: surgery  Consults: none  Significant Diagnostic Studies: ct head  Treatments: crani for evacuation of sdh  Discharge Exam: Blood pressure 147/94, pulse 64, temperature 98.5 F (36.9 C), temperature source Oral, resp. rate 18, height 5\' 11"  (1.803 m), weight 97.977 kg (216 lb), SpO2 96 %. Wound dry. No pain Disposition: home with family     Medication List    ASK your doctor about these medications        aspirin 325 MG tablet  Take 325 mg by mouth daily.     cloNIDine 0.2 MG tablet  Commonly known as:  CATAPRES  Take 0.4 mg by mouth 2 (two) times daily.     docusate sodium 100 MG capsule  Commonly known as:  COLACE  Take 1 capsule (100 mg total) by mouth every 12 (twelve) hours.     doxazosin 8 MG tablet  Commonly known as:  CARDURA  Take 8 mg by mouth at bedtime.     furosemide 80 MG tablet  Commonly known as:  LASIX  Take 160 mg by mouth 2 (two) times daily.     isosorbide-hydrALAZINE 20-37.5 MG tablet  Commonly known as:  BIDIL  Take 0.5 tablets by mouth 2 (two) times daily.     levothyroxine 25 MCG tablet  Commonly known as:  SYNTHROID, LEVOTHROID  Take 12.5 mcg by mouth daily.     metoprolol 100 MG tablet  Commonly known as:  LOPRESSOR  Take 150 mg by mouth 2 (two) times daily.     NIFEdipine 90 MG 24 hr tablet  Commonly known as:  PROCARDIA XL/ADALAT-CC  Take 90 mg by mouth daily.     OVER THE COUNTER MEDICATION  Take 1 capsule by mouth 2 (two) times daily. "Kidney Factor"     oxyCODONE-acetaminophen 5-325 MG tablet  Commonly known as:  PERCOCET/ROXICET  Take 1 tablet by mouth every 6 (six) hours as needed.     potassium chloride  SA 20 MEQ tablet  Commonly known as:  K-DUR,KLOR-CON  Take 20 mEq by mouth 2 (two) times daily.         Signed: Floyce Stakes 03/23/2015, 9:19 AM

## 2015-03-23 NOTE — Progress Notes (Signed)
Occupational Therapy Treatment Patient Details Name: Javier Jenkins MRN: [id number] DOB: [birth year] Today's Date: 03/23/2015    History of present illness pt presents with R Frontotemporal Parietal SDH s/p [facility] with drain placed.  pt with hx of CVA, DM, HTN, and CKD with L AV Fistula.     OT comments  Pt making progress toward OT goals this session. Pt currently min guard for UB dressing and min assist for LB dressing. Overall min guard for functional mobility. Pt noted to have significant amount of drainage from incision on head; RN aware and placed dressing over incision during session. D/c plan remains appropriate at this time. Will continue to follow acutely.    Follow Up Recommendations  No OT follow up;Supervision/Assistance - 24 hour    Equipment Recommendations  None recommended by OT    Recommendations for Other Services      Precautions / Restrictions Precautions Precautions: Fall Required Braces or Orthoses: Other Brace/Splint Other Brace/Splint: R AFO Restrictions Weight Bearing Restrictions: No       Mobility Bed Mobility               General bed mobility comments: Pt OOB in chair upon arrival.  Transfers Overall transfer level: Needs assistance Equipment used: None Transfers: Sit to/from Stand Sit to Stand: Min guard              Balance Overall balance assessment: Needs assistance Sitting-balance support: Feet supported;No upper extremity supported Sitting balance-Leahy Scale: Good     Standing balance support: No upper extremity supported;During functional activity Standing balance-Leahy Scale: Fair                     ADL Overall ADL's : Needs assistance/impaired                 Upper Body Dressing : Min guard;Sitting;Standing;Set up Upper Body Dressing Details (indicate cue type and reason): Pt able to doff and don button up shirt with set up and min guard for safety in standing. Lower Body Dressing: Minimal  assistance;Sit to/from stand Lower Body Dressing Details (indicate cue type and reason): Pt able to doff/don pants, shoes, R AFO with min assist for starting pant leg over R foot. Min guard for safety with sit to stand and balance in standing. Toilet Transfer: Min guard;Ambulation;Comfort height toilet           Functional mobility during ADLs: Min guard General ADL Comments: No family present for OT eval. Pt note to have significant drainage from incision with initial sit to stand; RN notified and applied dressing to wound during session.      Vision                     Perception     Praxis      Cognition   Behavior During Therapy: WFL for tasks assessed/performed Overall Cognitive Status: Within Functional Limits for tasks assessed                       Extremity/Trunk Assessment               Exercises     Shoulder Instructions       General Comments      Pertinent Vitals/ Pain       Pain Assessment: No/denies pain  Home Living  Prior Functioning/Environment              Frequency Min 2X/week     Progress Toward Goals  OT Goals(current goals can now be found in the care plan section)  Progress towards OT goals: Progressing toward goals  Acute Rehab OT Goals Patient Stated Goal: Home today OT Goal Formulation: With patient  Plan Discharge plan remains appropriate    Co-evaluation                 End of Session Equipment Utilized During Treatment: Gait belt;Other (comment) (R AFO)   Activity Tolerance Patient tolerated treatment well   Patient Left in chair;with call bell/phone within reach;with chair alarm set   Nurse Communication          Time: [id number] OT Time Calculation (min): 26 min  Charges: OT General Charges $OT Visit: 1 Procedure OT Treatments $Self Care/Home Management : 23-37 mins  Binnie Kand M.S., OTR/L Pager: [phone]   03/23/2015, 11:16 AM

## 2015-03-23 NOTE — Care Management Note (Signed)
Case Management Note  Patient Details  Name: BRAXDYN PERA MRN: [id number] Date of Birth: 09-Feb-1950  Subjective/Objective:                    Action/Plan: Patient discharging home today with his wife and self care. PT recommending HHPT but not ordered per Dr Joya Salm. No further needs per CM.   Expected Discharge Date:                  Expected Discharge Plan:  Home/Self Care  In-House Referral:     Discharge planning Services     Post Acute Care Choice:    Choice offered to:     DME Arranged:    DME Agency:     HH Arranged:    [place] Agency:     Status of Service:  Completed, signed off  Medicare Important Message Given:  Yes Date Medicare IM Given:    Medicare IM give by:    Date Additional Medicare IM Given:    Additional Medicare Important Message give by:     If discussed at [place] of Stay Meetings, dates discussed:    Additional Comments:  Pollie Friar, RN 03/23/2015, 10:22 AM

## 2015-03-23 NOTE — Progress Notes (Signed)
Pt for discharge home today. Discharge orders received. IV and telemetry dcd. Discharge instructions and prescriptions given with verbalized understanding. Wife at bedside to assist with discharge. Staff brought patient to lobby via wheelchair at 1150. Transported to home by family member.

## 2015-03-24 ENCOUNTER — Encounter (HOSPITAL_COMMUNITY): Payer: Self-pay | Admitting: Emergency Medicine

## 2015-03-24 ENCOUNTER — Emergency Department (HOSPITAL_COMMUNITY)
Admission: EM | Admit: 2015-03-24 | Discharge: 2015-03-24 | Disposition: A | Discharge status: Home / Self Care | Payer: PRIVATE HEALTH INSURANCE | Attending: Emergency Medicine | Admitting: Emergency Medicine

## 2015-03-24 DIAGNOSIS — K5900 Constipation, unspecified: Secondary | ICD-10-CM | POA: Diagnosis not present

## 2015-03-24 DIAGNOSIS — Z8673 Personal history of transient ischemic attack (TIA), and cerebral infarction without residual deficits: Secondary | ICD-10-CM | POA: Insufficient documentation

## 2015-03-24 DIAGNOSIS — Z4801 Encounter for change or removal of surgical wound dressing: Secondary | ICD-10-CM | POA: Insufficient documentation

## 2015-03-24 DIAGNOSIS — Z7982 Long term (current) use of aspirin: Secondary | ICD-10-CM | POA: Insufficient documentation

## 2015-03-24 DIAGNOSIS — E039 Hypothyroidism, unspecified: Secondary | ICD-10-CM | POA: Insufficient documentation

## 2015-03-24 DIAGNOSIS — Z9889 Other specified postprocedural states: Secondary | ICD-10-CM | POA: Diagnosis not present

## 2015-03-24 DIAGNOSIS — Z79899 Other long term (current) drug therapy: Secondary | ICD-10-CM | POA: Diagnosis not present

## 2015-03-24 DIAGNOSIS — N186 End stage renal disease: Secondary | ICD-10-CM | POA: Insufficient documentation

## 2015-03-24 DIAGNOSIS — Z5189 Encounter for other specified aftercare: Secondary | ICD-10-CM

## 2015-03-24 DIAGNOSIS — E119 Type 2 diabetes mellitus without complications: Secondary | ICD-10-CM | POA: Insufficient documentation

## 2015-03-24 DIAGNOSIS — I120 Hypertensive chronic kidney disease with stage 5 chronic kidney disease or end stage renal disease: Secondary | ICD-10-CM | POA: Insufficient documentation

## 2015-03-24 LAB — BASIC METABOLIC PANEL
ANION GAP: 17 — AB (ref 5–15)
BUN: 100 mg/dL — ABNORMAL HIGH (ref 6–20)
CHLORIDE: 96 mmol/L — AB (ref 101–111)
CO2: 22 mmol/L (ref 22–32)
CREATININE: 7.37 mg/dL — AB (ref 0.61–1.24)
Calcium: 9 mg/dL (ref 8.9–10.3)
GFR calc non Af Amer: 7 mL/min — ABNORMAL LOW (ref >60)
GFR, EST AFRICAN AMERICAN: 8 mL/min — AB (ref >60)
Glucose, Bld: 180 mg/dL — ABNORMAL HIGH (ref 65–99)
POTASSIUM: 3.5 mmol/L (ref 3.5–5.1)
SODIUM: 135 mmol/L (ref 135–145)

## 2015-03-24 LAB — CBC WITH DIFFERENTIAL/PLATELET
BASOS PCT: 1 %
Basophils Absolute: 0.1 10*3/uL (ref 0.0–0.1)
Eosinophils Absolute: 0.3 10*3/uL (ref 0.0–0.7)
Eosinophils Relative: 6 %
HCT: 29.8 % — ABNORMAL LOW (ref 39.0–52.0)
HEMOGLOBIN: 9.8 g/dL — AB (ref 13.0–17.0)
LYMPHS ABS: 1.1 10*3/uL (ref 0.7–4.0)
LYMPHS PCT: 19 %
MCH: 28.2 pg (ref 26.0–34.0)
MCHC: 32.9 g/dL (ref 30.0–36.0)
MCV: 85.6 fL (ref 78.0–100.0)
MONO ABS: 0.4 10*3/uL (ref 0.1–1.0)
MONOS PCT: 8 %
NEUTROS ABS: 3.7 10*3/uL (ref 1.7–7.7)
NEUTROS PCT: 66 %
Platelets: 335 10*3/uL (ref 150–400)
RBC: 3.48 MIL/uL — AB (ref 4.22–5.81)
RDW: 13.5 % (ref 11.5–15.5)
WBC: 5.5 10*3/uL (ref 4.0–10.5)

## 2015-03-24 NOTE — ED Provider Notes (Signed)
CSN: [id number]     Arrival date & time 03/24/15  1821 History   First MD Initiated Contact with Patient 03/24/15 2129     Chief Complaint  Patient presents with  . Wound Check   HPI Comments: Wife reports that she noticed a little bit of a smell when changing patient's bandage today.  Patient discharged from hospital 03/23/15 after he underwent a burr hole for a subdural hematoma.  She contacted Dr Harley Hallmark office for recommendations and they recommended that he seek evaluation here in the ED.  She denies fevers, chills, malaise, increased tenderness, swelling or purulence.  Really just wanted to make sure that there was no infection.  The history is provided by the patient and the spouse.    Past Medical History  Diagnosis Date  . Hypertension   . Stroke [facility])     right side paralysis  . Hypothyroidism   . Diabetes mellitus without complication ([place])     not on medications  . Chronic kidney disease     stage 5  . Constipation    Past Surgical History  Procedure Laterality Date  . Hemorrhoid surgery    . Av fistula placement Left 10/18/2014    Procedure: ARTERIOVENOUS (AV) FISTULA CREATION;  Surgeon: Angelia Mould, MD;  Location: [place];  Service: Vascular;  Laterality: Left;  . Burr hole Right 03/17/2015    Procedure: BURR HOLES FOR RIGHT SUBDURAL HEMATOMA;  Surgeon: Leeroy Cha, MD;  Location: [place] [facility];  Service: Neurosurgery;  Laterality: Right;   Family History  Problem Relation Age of Onset  . Hypertension Mother    Social History  Substance Use Topics  . Smoking status: Former Smoker    Types: Pipe    Quit date: 09/22/1984  . Smokeless tobacco: Never Used  . Alcohol Use: No    Review of Systems  Constitutional: Negative for fever, chills and fatigue.  Skin: Negative for rash.       No purulence noted from wound.      Allergies  Review of patient's allergies indicates no known allergies.  Home Medications   Prior to Admission medications    Medication Sig Start Date End Date Taking? Authorizing Provider  aspirin 325 MG tablet Take 325 mg by mouth daily.    Historical Provider, MD  cloNIDine (CATAPRES) 0.2 MG tablet Take 0.4 mg by mouth 2 (two) times daily.     Historical Provider, MD  docusate sodium (COLACE) 100 MG capsule Take 1 capsule (100 mg total) by mouth every 12 (twelve) hours. Patient taking differently: Take 100 mg by mouth 2 (two) times daily as needed (constipation).  04/03/14   Junius Creamer, NP  doxazosin (CARDURA) 8 MG tablet Take 8 mg by mouth at bedtime.  02/22/15   Historical Provider, MD  furosemide (LASIX) 80 MG tablet Take 160 mg by mouth 2 (two) times daily.     Historical Provider, MD  isosorbide-hydrALAZINE (BIDIL) 20-37.5 MG tablet Take 0.5 tablets by mouth 2 (two) times daily.    Historical Provider, MD  levothyroxine (SYNTHROID, LEVOTHROID) 25 MCG tablet Take 12.5 mcg by mouth daily. 01/29/14   Historical Provider, MD  metoprolol (LOPRESSOR) 100 MG tablet Take 150 mg by mouth 2 (two) times daily.     Historical Provider, MD  NIFEdipine (PROCARDIA XL/ADALAT-CC) 90 MG 24 hr tablet Take 90 mg by mouth daily.    Historical Provider, MD  OVER THE COUNTER MEDICATION Take 1 capsule by mouth 2 (two) times daily. "Kidney Factor"  Historical Provider, MD  oxyCODONE-acetaminophen (PERCOCET/ROXICET) 5-325 MG per tablet Take 1 tablet by mouth every 6 (six) hours as needed. Patient not taking: Reported on 02/01/2015 10/18/14   Ulyses Amor, PA-C  potassium chloride SA (K-DUR,KLOR-CON) 20 MEQ tablet Take 20 mEq by mouth 2 (two) times daily.    Historical Provider, MD   BP 142/86 mmHg  Pulse 73  Temp(Src) 97.9 F (36.6 C) (Oral)  Resp 20  SpO2 100% Physical Exam  Constitutional: He is oriented to person, place, and time. He appears well-developed and well-nourished. No distress.  Right UE with contractures.  HENT:  Right side of head with staples.  Bandage saturated with clear/ serosanguinous discharge.  No induration,  fluctuance at incision sites.  No surrounding erythema.  No tenderness to palpation.  No purulence or foul odors.  Eyes: Conjunctivae are normal.  Neurological: He is alert and oriented to person, place, and time.  Skin: Skin is warm. He is not diaphoretic. No erythema.  Psychiatric: He has a normal mood and affect. His behavior is normal.    ED Course  Procedures (including critical care time) Labs Review Labs Reviewed  CBC WITH DIFFERENTIAL/PLATELET - Abnormal; Notable for the following:    RBC 3.48 (*)    Hemoglobin 9.8 (*)    HCT 29.8 (*)    All other components within normal limits  BASIC METABOLIC PANEL - Abnormal; Notable for the following:    Chloride 96 (*)    Glucose, Bld 180 (*)    BUN 100 (*)    Creatinine, Ser 7.37 (*)    GFR calc non Af Amer 7 (*)    GFR calc Af Amer 8 (*)    Anion gap 17 (*)    All other components within normal limits    Imaging Review No results found. I have personally reviewed and evaluated these images and lab results as part of my medical decision-making.   EKG Interpretation None      MDM   Final diagnoses:  Visit for wound check    Javier Jenkins is a 65 y.o. male that presents to ED for wound check after being discharged 03/23/15 s/p burr hole for subdural hematoma.  Wound appears non infected.  Healing well.  Patient is afebrile here with stable vitals.  Wound redressed before discharge from ED.  Discussed wound care.  Return precautions reviewed.  Patient has follow up scheduled with Dr Joya Salm in 2 weeks.   Janora Norlander, DO 03/24/15 2159  Leonard Schwartz, MD 03/24/15 2204

## 2015-03-24 NOTE — Discharge Instructions (Signed)
You were seen in the ED for wound check.  There was no evidence of infection on exam.  Continue to keep area clean and monitor for fevers or pus.  Follow up with Dr Joya Salm as scheduled or return to ED if he develops evidence of infection.

## 2015-03-24 NOTE — ED Notes (Signed)
Pt states "No new odor, just want to know what we can wash it with."

## 2015-03-24 NOTE — ED Notes (Signed)
Patient verbalized understanding of discharge instructions and denies any further needs or questions at this time. VS stable. Patient ambulatory with steady gait. Assisted to ED entrance in wheelchair.   

## 2015-03-24 NOTE — ED Notes (Signed)
Pt sts drainage from wound from brain sx with ventriculostomy; pt was discharged yesterday and here for check; pt denies pain

## 2015-04-02 ENCOUNTER — Emergency Department (HOSPITAL_COMMUNITY): Payer: PRIVATE HEALTH INSURANCE

## 2015-04-02 ENCOUNTER — Encounter (HOSPITAL_COMMUNITY): Payer: Self-pay

## 2015-04-02 ENCOUNTER — Emergency Department (HOSPITAL_COMMUNITY)
Admission: EM | Admit: 2015-04-02 | Discharge: 2015-04-02 | Disposition: A | Discharge status: Home / Self Care | Payer: PRIVATE HEALTH INSURANCE | Attending: Emergency Medicine | Admitting: Emergency Medicine

## 2015-04-02 DIAGNOSIS — E119 Type 2 diabetes mellitus without complications: Secondary | ICD-10-CM | POA: Diagnosis not present

## 2015-04-02 DIAGNOSIS — I120 Hypertensive chronic kidney disease with stage 5 chronic kidney disease or end stage renal disease: Secondary | ICD-10-CM | POA: Insufficient documentation

## 2015-04-02 DIAGNOSIS — G839 Paralytic syndrome, unspecified: Secondary | ICD-10-CM | POA: Diagnosis not present

## 2015-04-02 DIAGNOSIS — Z79899 Other long term (current) drug therapy: Secondary | ICD-10-CM | POA: Diagnosis not present

## 2015-04-02 DIAGNOSIS — G8191 Hemiplegia, unspecified affecting right dominant side: Secondary | ICD-10-CM | POA: Diagnosis not present

## 2015-04-02 DIAGNOSIS — Z8719 Personal history of other diseases of the digestive system: Secondary | ICD-10-CM | POA: Diagnosis not present

## 2015-04-02 DIAGNOSIS — R531 Weakness: Secondary | ICD-10-CM | POA: Diagnosis present

## 2015-04-02 DIAGNOSIS — Z87891 Personal history of nicotine dependence: Secondary | ICD-10-CM | POA: Insufficient documentation

## 2015-04-02 DIAGNOSIS — R29810 Facial weakness: Secondary | ICD-10-CM | POA: Insufficient documentation

## 2015-04-02 DIAGNOSIS — I6789 Other cerebrovascular disease: Secondary | ICD-10-CM | POA: Diagnosis not present

## 2015-04-02 DIAGNOSIS — Z8673 Personal history of transient ischemic attack (TIA), and cerebral infarction without residual deficits: Secondary | ICD-10-CM | POA: Insufficient documentation

## 2015-04-02 DIAGNOSIS — R251 Tremor, unspecified: Secondary | ICD-10-CM

## 2015-04-02 DIAGNOSIS — E039 Hypothyroidism, unspecified: Secondary | ICD-10-CM | POA: Diagnosis not present

## 2015-04-02 DIAGNOSIS — Z7982 Long term (current) use of aspirin: Secondary | ICD-10-CM | POA: Insufficient documentation

## 2015-04-02 DIAGNOSIS — N185 Chronic kidney disease, stage 5: Secondary | ICD-10-CM | POA: Diagnosis not present

## 2015-04-02 LAB — CBC
HEMATOCRIT: 32 % — AB (ref 39.0–52.0)
HEMOGLOBIN: 10.4 g/dL — AB (ref 13.0–17.0)
MCH: 28.3 pg (ref 26.0–34.0)
MCHC: 32.5 g/dL (ref 30.0–36.0)
MCV: 87.2 fL (ref 78.0–100.0)
Platelets: 479 10*3/uL — ABNORMAL HIGH (ref 150–400)
RBC: 3.67 MIL/uL — AB (ref 4.22–5.81)
RDW: 13.2 % (ref 11.5–15.5)
WBC: 8.3 10*3/uL (ref 4.0–10.5)

## 2015-04-02 LAB — BASIC METABOLIC PANEL
ANION GAP: 18 — AB (ref 5–15)
BUN: 96 mg/dL — ABNORMAL HIGH (ref 6–20)
CALCIUM: 9.3 mg/dL (ref 8.9–10.3)
CO2: 22 mmol/L (ref 22–32)
Chloride: 97 mmol/L — ABNORMAL LOW (ref 101–111)
Creatinine, Ser: 8.17 mg/dL — ABNORMAL HIGH (ref 0.61–1.24)
GFR, EST AFRICAN AMERICAN: 7 mL/min — AB (ref >60)
GFR, EST NON AFRICAN AMERICAN: 6 mL/min — AB (ref >60)
Glucose, Bld: 105 mg/dL — ABNORMAL HIGH (ref 65–99)
POTASSIUM: 4.1 mmol/L (ref 3.5–5.1)
Sodium: 137 mmol/L (ref 135–145)

## 2015-04-02 LAB — URINALYSIS, ROUTINE W REFLEX MICROSCOPIC
BILIRUBIN URINE: NEGATIVE
Glucose, UA: NEGATIVE mg/dL
Ketones, ur: NEGATIVE mg/dL
LEUKOCYTES UA: NEGATIVE
NITRITE: NEGATIVE
Protein, ur: 100 mg/dL — AB
SPECIFIC GRAVITY, URINE: 1.012 (ref 1.005–1.030)
pH: 5.5 (ref 5.0–8.0)

## 2015-04-02 LAB — URINE MICROSCOPIC-ADD ON: BACTERIA UA: NONE SEEN

## 2015-04-02 LAB — CBG MONITORING, ED: GLUCOSE-CAPILLARY: 98 mg/dL (ref 65–99)

## 2015-04-02 MED ORDER — METOPROLOL TARTRATE 25 MG PO TABS
100.0000 mg | ORAL_TABLET | Freq: Once | ORAL | Status: AC
  Administered 2015-04-02: 100 mg via ORAL
  Filled 2015-04-02: qty 4

## 2015-04-02 MED ORDER — CLONIDINE HCL 0.2 MG PO TABS
0.2000 mg | ORAL_TABLET | Freq: Once | ORAL | Status: AC
  Administered 2015-04-02: 0.2 mg via ORAL
  Filled 2015-04-02: qty 1

## 2015-04-02 NOTE — ED Notes (Signed)
RN unable to draw labs, notified phlebotomy

## 2015-04-02 NOTE — ED Notes (Signed)
[facility]- pt coming from home. Hx of recent brain bleed that was evacuated. Pt reports left side weakness and tremors that started about 2 days ago. Pt hypertensive with EMS but has not taken BP meds today. Pt alert and oriented on arrival and symptoms have resolved.

## 2015-04-02 NOTE — ED Provider Notes (Signed)
CSN: [id number]     Arrival date & time 04/02/15  1309 History   First MD Initiated Contact with Patient 04/02/15 1327     Chief Complaint  Patient presents with  . Weakness     (Consider location/radiation/quality/duration/timing/severity/associated sxs/prior Treatment) HPI   64yM with involuntary shaking/jerking of LUE. Intermittent episodes. He estimates 5-6 in past 3 days. Last a few minutes and then spontaneous subsided. Arm with move involuntarily. Alert during these episodes. No other associated symptoms. Has a past hx of CVA with baseline R hemiparesis. Also recent R SDH s/p evacuation a few weeks ago. Reports has been recovering well from this. Denies recent medication changes. No hx of seizure.     Past Medical History  Diagnosis Date  . Hypertension   . Stroke [facility])     right side paralysis  . Hypothyroidism   . Diabetes mellitus without complication ([place])     not on medications  . Chronic kidney disease     stage 5  . Constipation    Past Surgical History  Procedure Laterality Date  . Hemorrhoid surgery    . Av fistula placement Left 10/18/2014    Procedure: ARTERIOVENOUS (AV) FISTULA CREATION;  Surgeon: Angelia Mould, MD;  Location: [place];  Service: Vascular;  Laterality: Left;  . Burr hole Right 03/17/2015    Procedure: BURR HOLES FOR RIGHT SUBDURAL HEMATOMA;  Surgeon: Leeroy Cha, MD;  Location: [place] [facility];  Service: Neurosurgery;  Laterality: Right;   Family History  Problem Relation Age of Onset  . Hypertension Mother    Social History  Substance Use Topics  . Smoking status: Former Smoker    Types: Pipe    Quit date: 09/22/1984  . Smokeless tobacco: Never Used  . Alcohol Use: No    Review of Systems  All systems reviewed and negative, other than as noted in HPI.   Allergies  Review of patient's allergies indicates no known allergies.  Home Medications   Prior to Admission medications   Medication Sig Start Date End Date Taking?  Authorizing Provider  aspirin 325 MG tablet Take 325 mg by mouth daily.   Yes Historical Provider, MD  cloNIDine (CATAPRES) 0.2 MG tablet Take 0.4 mg by mouth 2 (two) times daily.    Yes Historical Provider, MD  docusate sodium (COLACE) 100 MG capsule Take 1 capsule (100 mg total) by mouth every 12 (twelve) hours. Patient taking differently: Take 100 mg by mouth every 12 (twelve) hours.  04/03/14  Yes Junius Creamer, NP  doxazosin (CARDURA) 8 MG tablet Take 8 mg by mouth at bedtime.  02/22/15  Yes Historical Provider, MD  furosemide (LASIX) 80 MG tablet Take 160 mg by mouth 2 (two) times daily.    Yes Historical Provider, MD  isosorbide-hydrALAZINE (BIDIL) 20-37.5 MG tablet Take 0.5 tablets by mouth 2 (two) times daily.   Yes Historical Provider, MD  levothyroxine (SYNTHROID, LEVOTHROID) 25 MCG tablet Take 25 mcg by mouth daily.  01/29/14  Yes Historical Provider, MD  metoprolol (LOPRESSOR) 100 MG tablet Take 150 mg by mouth 2 (two) times daily.    Yes Historical Provider, MD  NIFEdipine (PROCARDIA XL/ADALAT-CC) 90 MG 24 hr tablet Take 90 mg by mouth daily.   Yes Historical Provider, MD  OVER THE COUNTER MEDICATION Take 1 capsule by mouth 2 (two) times daily. "Kidney Factor"   Yes Historical Provider, MD  potassium chloride SA (K-DUR,KLOR-CON) 20 MEQ tablet Take 20 mEq by mouth 2 (two) times daily.  Yes Historical Provider, MD  oxyCODONE-acetaminophen (PERCOCET/ROXICET) 5-325 MG per tablet Take 1 tablet by mouth every 6 (six) hours as needed. Patient not taking: Reported on 02/01/2015 10/18/14   Ulyses Amor, PA-C   BP 173/100 mmHg  Pulse 71  Temp(Src) 98.1 F (36.7 C) (Oral)  Resp 19  SpO2 99% Physical Exam  Constitutional: He appears well-developed and well-nourished. No distress.  HENT:  Head: Normocephalic and atraumatic.  Eyes: Conjunctivae are normal. Right eye exhibits no discharge. Left eye exhibits no discharge.  Neck: Neck supple.  Cardiovascular: Normal rate, regular rhythm and normal  heart sounds.  Exam reveals no gallop and no friction rub.   No murmur heard. Pulmonary/Chest: Effort normal and breath sounds normal. No respiratory distress.  Abdominal: Soft. He exhibits no distension. There is no tenderness.  Musculoskeletal: He exhibits no edema or tenderness.  R hemiparesis with contractures RUE. Strength 5/5 L u/l extremities. Muscle tone normal on L. Facial droop, CN otherwise intact.   Neurological: He is alert.  Skin: Skin is warm and dry.  Psychiatric: He has a normal mood and affect. His behavior is normal. Thought content normal.  Nursing note and vitals reviewed.   ED Course  Procedures (including critical care time) Labs Review Labs Reviewed  BASIC METABOLIC PANEL - Abnormal; Notable for the following:    Chloride 97 (*)    Glucose, Bld 105 (*)    BUN 96 (*)    Creatinine, Ser 8.17 (*)    GFR calc non Af Amer 6 (*)    GFR calc Af Amer 7 (*)    Anion gap 18 (*)    All other components within normal limits  CBC - Abnormal; Notable for the following:    RBC 3.67 (*)    Hemoglobin 10.4 (*)    HCT 32.0 (*)    Platelets 479 (*)    All other components within normal limits  URINALYSIS, ROUTINE W REFLEX MICROSCOPIC (NOT AT [facility]) - Abnormal; Notable for the following:    Hgb urine dipstick TRACE (*)    Protein, ur 100 (*)    All other components within normal limits  URINE MICROSCOPIC-ADD ON - Abnormal; Notable for the following:    Squamous Epithelial / LPF 0-5 (*)    All other components within normal limits  CBG MONITORING, ED    Imaging Review Ct Head Wo Contrast  04/02/2015  CLINICAL DATA:  Two weeks status post craniotomy acute old right subdural hematoma. Left arm shaking and possible seizure. EXAM: CT HEAD WITHOUT CONTRAST TECHNIQUE: Contiguous axial images were obtained from the base of the skull through the vertex without intravenous contrast. COMPARISON:  03/22/2015 FINDINGS: Right posterior frontal burr hole again demonstrated. Right  frontal subdural hygroma in right frontal pneumocephalus is decreased since previous study slightly, now measuring 10 mm in maximal thickness on image 17/series 201 compared to 11 mm previously. A small low-attenuation left frontal subdural hygroma also remains stable measuring approximately 5 mm in thickness. No evidence of acute intracranial hemorrhage or herniation. Ventricles remain stable in size. Diffuse cerebral atrophy and old left deep periventricular white matter infarct show no significant change. IMPRESSION: Mild decrease in size of right frontal subdural hygroma. Stable small left frontal subdural hygroma. No acute findings or midline shift. Electronically Signed   By: Earle Gell M.D.   On: 04/02/2015 15:26   I have personally reviewed and evaluated these images and lab results as part of my medical decision-making.   EKG Interpretation None  MDM   Final diagnoses:  Tremor    Very pleasant 64yM with what I suspect may be simple partial seizures? Intermittent involuntary shaking/jerking of LUE which subsides in a few minutes. Recent R sided SDH s/p crani. No symptoms while in ED. Neuro exam is consistent with his baseline after previous CVA. CT head today shows stability or improvement of subdural hygromas. Has previously seen a neurologist after CVA but reports has currently does not have one. I think he needs to discuss further, but that this can be done as an outpatient.     Virgel Manifold, MD 04/05/15 [phone]

## 2015-04-02 NOTE — Discharge Instructions (Signed)
Tremor °A tremor is trembling or shaking that you cannot control. Most tremors affect the hands or arms. Tremors can also affect the head, vocal cords, face, and other parts of the body. There are many types of tremors. Common types include:  °· Essential tremor. These usually occur in people over the age of 40. It may run in families and can happen in otherwise healthy people.   °· Resting tremor. These occur when the muscles are at rest, such as when your hands are resting in your lap. People with Parkinson disease often have resting tremors.   °· Postural tremor. These occur when you try to hold a pose, such as keeping your hands outstretched.   °· Kinetic tremor. These occur during purposeful movement, such as trying to touch a finger to your nose.   °· Task-specific tremor. These may occur when you perform tasks such as handwriting, speaking, or standing.   °· Psychogenic tremor. These dramatically lessen or disappear when you are distracted. They can happen in people of all ages.   °Some types of tremors have no known cause. Tremors can also be a symptom of nervous system problems (neurological disorders) that may occur with aging. Some tremors go away with treatment while others do not.  °HOME CARE INSTRUCTIONS °Watch your tremor for any changes. The following actions may help to lessen any discomfort you are feeling: °· Take medicines only as directed by your health care provider.   °· Limit alcohol intake to no more than 1 drink per day for nonpregnant women and 2 drinks per day for men. One drink equals 12 oz of beer, 5 oz of wine, or 1½ oz of hard liquor. °· Do not use any tobacco products, including cigarettes, chewing tobacco, or electronic cigarettes. If you need help quitting, ask your health care provider.   °· Avoid extreme heat or cold.    °· Limit the amount of caffeine you consume as directed by your health care provider.   °· Try to get 8 hours of sleep each night. °· Find ways to manage your  stress, such as meditation or yoga. °· Keep all follow-up visits as directed by your health care provider. This is important. °SEEK MEDICAL CARE IF: °· You start having a tremor after starting a new medicine. °· You have tremor with other symptoms such as: °¨ Numbness. °¨ Tingling. °¨ Pain. °¨ Weakness. °· Your tremor gets worse. °· Your tremor interferes with your day-to-day life. °  °This information is not intended to replace advice given to you by your health care provider. Make sure you discuss any questions you have with your health care provider. °  °Document Released: 01/04/2002 Document Revised: 02/04/2014 Document Reviewed: 07/12/2013 °Elsevier Interactive Patient Education ©2016 [employer] ° °

## 2015-04-13 ENCOUNTER — Encounter (HOSPITAL_COMMUNITY): Payer: Self-pay | Admitting: Emergency Medicine

## 2015-04-13 ENCOUNTER — Observation Stay (HOSPITAL_COMMUNITY)
Admission: EM | Admit: 2015-04-13 | Discharge: 2015-04-14 | Disposition: A | Discharge status: Home / Self Care | Payer: PRIVATE HEALTH INSURANCE | Attending: Internal Medicine | Admitting: Internal Medicine

## 2015-04-13 ENCOUNTER — Emergency Department (HOSPITAL_COMMUNITY): Payer: PRIVATE HEALTH INSURANCE

## 2015-04-13 DIAGNOSIS — R531 Weakness: Secondary | ICD-10-CM | POA: Diagnosis not present

## 2015-04-13 DIAGNOSIS — Z7982 Long term (current) use of aspirin: Secondary | ICD-10-CM | POA: Diagnosis not present

## 2015-04-13 DIAGNOSIS — Z87891 Personal history of nicotine dependence: Secondary | ICD-10-CM | POA: Insufficient documentation

## 2015-04-13 DIAGNOSIS — N186 End stage renal disease: Secondary | ICD-10-CM | POA: Diagnosis not present

## 2015-04-13 DIAGNOSIS — R402431 Glasgow coma scale score 3-8, in the field [EMT or ambulance]: Secondary | ICD-10-CM | POA: Diagnosis not present

## 2015-04-13 DIAGNOSIS — I120 Hypertensive chronic kidney disease with stage 5 chronic kidney disease or end stage renal disease: Secondary | ICD-10-CM | POA: Insufficient documentation

## 2015-04-13 DIAGNOSIS — R569 Unspecified convulsions: Secondary | ICD-10-CM | POA: Insufficient documentation

## 2015-04-13 DIAGNOSIS — E039 Hypothyroidism, unspecified: Secondary | ICD-10-CM | POA: Diagnosis not present

## 2015-04-13 DIAGNOSIS — K5730 Diverticulosis of large intestine without perforation or abscess without bleeding: Secondary | ICD-10-CM | POA: Insufficient documentation

## 2015-04-13 DIAGNOSIS — E876 Hypokalemia: Secondary | ICD-10-CM | POA: Diagnosis not present

## 2015-04-13 DIAGNOSIS — E1122 Type 2 diabetes mellitus with diabetic chronic kidney disease: Secondary | ICD-10-CM | POA: Insufficient documentation

## 2015-04-13 DIAGNOSIS — W19XXXA Unspecified fall, initial encounter: Secondary | ICD-10-CM

## 2015-04-13 DIAGNOSIS — R55 Syncope and collapse: Principal | ICD-10-CM | POA: Diagnosis present

## 2015-04-13 DIAGNOSIS — K449 Diaphragmatic hernia without obstruction or gangrene: Secondary | ICD-10-CM | POA: Diagnosis not present

## 2015-04-13 DIAGNOSIS — Z9181 History of falling: Secondary | ICD-10-CM | POA: Diagnosis not present

## 2015-04-13 DIAGNOSIS — R262 Difficulty in walking, not elsewhere classified: Secondary | ICD-10-CM | POA: Insufficient documentation

## 2015-04-13 DIAGNOSIS — I10 Essential (primary) hypertension: Secondary | ICD-10-CM | POA: Diagnosis present

## 2015-04-13 DIAGNOSIS — Z992 Dependence on renal dialysis: Secondary | ICD-10-CM | POA: Insufficient documentation

## 2015-04-13 DIAGNOSIS — N184 Chronic kidney disease, stage 4 (severe): Secondary | ICD-10-CM | POA: Diagnosis not present

## 2015-04-13 DIAGNOSIS — I69351 Hemiplegia and hemiparesis following cerebral infarction affecting right dominant side: Secondary | ICD-10-CM | POA: Diagnosis not present

## 2015-04-13 DIAGNOSIS — Z87820 Personal history of traumatic brain injury: Secondary | ICD-10-CM | POA: Diagnosis not present

## 2015-04-13 DIAGNOSIS — N189 Chronic kidney disease, unspecified: Secondary | ICD-10-CM

## 2015-04-13 DIAGNOSIS — D631 Anemia in chronic kidney disease: Secondary | ICD-10-CM | POA: Diagnosis present

## 2015-04-13 LAB — CBC
HEMATOCRIT: 30 % — AB (ref 39.0–52.0)
HEMOGLOBIN: 9.6 g/dL — AB (ref 13.0–17.0)
MCH: 28.2 pg (ref 26.0–34.0)
MCHC: 32 g/dL (ref 30.0–36.0)
MCV: 88 fL (ref 78.0–100.0)
Platelets: 298 10*3/uL (ref 150–400)
RBC: 3.41 MIL/uL — AB (ref 4.22–5.81)
RDW: 13.3 % (ref 11.5–15.5)
WBC: 8.3 10*3/uL (ref 4.0–10.5)

## 2015-04-13 LAB — DIFFERENTIAL
BASOS ABS: 0 10*3/uL (ref 0.0–0.1)
Basophils Relative: 0 %
Eosinophils Absolute: 0.1 10*3/uL (ref 0.0–0.7)
Eosinophils Relative: 1 %
LYMPHS ABS: 0.5 10*3/uL — AB (ref 0.7–4.0)
Lymphocytes Relative: 7 %
MONOS PCT: 6 %
Monocytes Absolute: 0.5 10*3/uL (ref 0.1–1.0)
NEUTROS ABS: 6.9 10*3/uL (ref 1.7–7.7)
Neutrophils Relative %: 86 %

## 2015-04-13 LAB — I-STAT TROPONIN, ED: Troponin i, poc: 0.01 ng/mL (ref 0.00–0.08)

## 2015-04-13 LAB — COMPREHENSIVE METABOLIC PANEL
ALK PHOS: 48 U/L (ref 38–126)
ALT: 28 U/L (ref 17–63)
ANION GAP: 19 — AB (ref 5–15)
AST: 24 U/L (ref 15–41)
Albumin: 3.4 g/dL — ABNORMAL LOW (ref 3.5–5.0)
BUN: 77 mg/dL — ABNORMAL HIGH (ref 6–20)
CALCIUM: 9.3 mg/dL (ref 8.9–10.3)
CO2: 21 mmol/L — AB (ref 22–32)
CREATININE: 6.48 mg/dL — AB (ref 0.61–1.24)
Chloride: 97 mmol/L — ABNORMAL LOW (ref 101–111)
GFR, EST AFRICAN AMERICAN: 9 mL/min — AB (ref >60)
GFR, EST NON AFRICAN AMERICAN: 8 mL/min — AB (ref >60)
Glucose, Bld: 120 mg/dL — ABNORMAL HIGH (ref 65–99)
Potassium: 3.3 mmol/L — ABNORMAL LOW (ref 3.5–5.1)
SODIUM: 137 mmol/L (ref 135–145)
Total Bilirubin: 0.6 mg/dL (ref 0.3–1.2)
Total Protein: 8.2 g/dL — ABNORMAL HIGH (ref 6.5–8.1)

## 2015-04-13 LAB — PROTIME-INR
INR: 1.16 (ref 0.00–1.49)
Prothrombin Time: 15 seconds (ref 11.6–15.2)

## 2015-04-13 LAB — APTT: aPTT: 40 seconds — ABNORMAL HIGH (ref 24–37)

## 2015-04-13 LAB — CBG MONITORING, ED: Glucose-Capillary: 106 mg/dL — ABNORMAL HIGH (ref 65–99)

## 2015-04-13 MED ORDER — OXYCODONE-ACETAMINOPHEN 5-325 MG PO TABS: 1.0000 | ORAL_TABLET | Freq: Four times a day (QID) | ORAL | Status: DC | PRN

## 2015-04-13 MED ORDER — NIFEDIPINE ER OSMOTIC RELEASE 90 MG PO TB24
90.0000 mg | ORAL_TABLET | Freq: Every day | ORAL | Status: DC
  Administered 2015-04-14: 90 mg via ORAL
  Filled 2015-04-13: qty 1

## 2015-04-13 MED ORDER — LEVOTHYROXINE SODIUM 25 MCG PO TABS
25.0000 ug | ORAL_TABLET | Freq: Every day | ORAL | Status: DC
  Administered 2015-04-14: 25 ug via ORAL
  Filled 2015-04-13: qty 1

## 2015-04-13 MED ORDER — DOXAZOSIN MESYLATE 8 MG PO TABS
8.0000 mg | ORAL_TABLET | Freq: Every day | ORAL | Status: DC
  Administered 2015-04-14: 8 mg via ORAL
  Filled 2015-04-13 (×2): qty 1

## 2015-04-13 MED ORDER — METOPROLOL TARTRATE 100 MG PO TABS
150.0000 mg | ORAL_TABLET | Freq: Two times a day (BID) | ORAL | Status: DC
  Administered 2015-04-14 (×2): 150 mg via ORAL
  Filled 2015-04-13 (×2): qty 1

## 2015-04-13 MED ORDER — DOCUSATE SODIUM 100 MG PO CAPS
100.0000 mg | ORAL_CAPSULE | Freq: Two times a day (BID) | ORAL | Status: DC
  Administered 2015-04-14 (×2): 100 mg via ORAL
  Filled 2015-04-13 (×2): qty 1

## 2015-04-13 MED ORDER — CLONIDINE HCL 0.2 MG PO TABS
0.4000 mg | ORAL_TABLET | Freq: Two times a day (BID) | ORAL | Status: DC
  Administered 2015-04-14 (×2): 0.4 mg via ORAL
  Filled 2015-04-13 (×2): qty 2

## 2015-04-13 MED ORDER — FUROSEMIDE 80 MG PO TABS
160.0000 mg | ORAL_TABLET | Freq: Two times a day (BID) | ORAL | Status: DC
  Administered 2015-04-14 (×2): 160 mg via ORAL
  Filled 2015-04-13 (×2): qty 2

## 2015-04-13 MED ORDER — CALCIUM ACETATE (PHOS BINDER) 667 MG PO CAPS
1334.0000 mg | ORAL_CAPSULE | Freq: Three times a day (TID) | ORAL | Status: DC
  Administered 2015-04-14 (×3): 1334 mg via ORAL
  Filled 2015-04-13 (×3): qty 2

## 2015-04-13 MED ORDER — ISOSORB DINITRATE-HYDRALAZINE 20-37.5 MG PO TABS
0.5000 | ORAL_TABLET | Freq: Two times a day (BID) | ORAL | Status: DC
  Administered 2015-04-14 (×2): 0.5 via ORAL
  Filled 2015-04-13 (×2): qty 1

## 2015-04-13 MED ORDER — STROKE: EARLY STAGES OF RECOVERY BOOK
Freq: Once | Status: AC
  Administered 2015-04-14
  Filled 2015-04-13: qty 1

## 2015-04-13 MED ORDER — LEVETIRACETAM 500 MG PO TABS
500.0000 mg | ORAL_TABLET | Freq: Two times a day (BID) | ORAL | Status: DC
  Administered 2015-04-14 (×2): 500 mg via ORAL
  Filled 2015-04-13 (×2): qty 1

## 2015-04-13 NOTE — H&P (Signed)
[facility] History and Physical  MATTHUE RUFTY [id number] DOB: 05-19-1950 DOA: 04/13/2015  Referring physician: ER physician. PCP: Maximino Greenland, MD  Specialists: Dr. Joya Salm. Neurosurgeon.  Chief Complaint: Loss of consciousness.  History obtained from patient and patient's wife.  HPI: Javier Jenkins is a 65 y.o. 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 EMS was called and patient became responsive after 5-6 minutes. Patient denies any headache or any new focal deficits. Patient states that since his procedure last month for burr hole for subdural hematoma patient has been having left upper extremity shaking movements which last for a few minutes each time. Patient during the unresponsive episode had incontinence of urine but did not bite his tongue. Denies any chest pain or shortness of breath. CT head shows improvement in the subdural hematoma with some new changes. Dr. Joya Salm, on-call neurosurgeon was consulted and patient has been admitted for further observation. EKG shows normal sinus rhythm with acceptable QTC.   Review of Systems: As presented in the history of presenting illness, rest negative.  Past Medical History  Diagnosis Date  . Hypertension   . Stroke [facility])     right side paralysis  . Hypothyroidism   . Diabetes mellitus without complication ([place])     not on medications  . Chronic kidney disease     stage 5  . Constipation    Past Surgical History  Procedure Laterality Date  . Hemorrhoid surgery    . Av fistula placement Left 10/18/2014    Procedure: ARTERIOVENOUS (AV) FISTULA CREATION;  Surgeon: Angelia Mould, MD;  Location: [place];  Service: Vascular;   Laterality: Left;  . Burr hole Right 03/17/2015    Procedure: BURR HOLES FOR RIGHT SUBDURAL HEMATOMA;  Surgeon: Leeroy Cha, MD;  Location: [place] [facility];  Service: Neurosurgery;  Laterality: Right;   Social History:  reports that he quit smoking about 30 years ago. His smoking use included Pipe. He has never used smokeless tobacco. He reports that he does not drink alcohol or use illicit drugs. Where does patient live Home. Can patient participate in ADLs? Yes.  No Known Allergies  Family History:  Family History  Problem Relation Age of Onset  . Hypertension Mother       Prior to Admission medications   Medication Sig Start Date End Date Taking? Authorizing Provider  aspirin 325 MG tablet Take 325 mg by mouth daily.   Yes Historical Provider, MD  calcium acetate (PHOSLO) 667 MG capsule Take 2 capsules by mouth 3 (three) times daily before meals. 04/06/15  Yes Historical Provider, MD  cloNIDine (CATAPRES) 0.2 MG tablet Take 0.4 mg by mouth 2 (two) times daily.    Yes Historical Provider, MD  docusate sodium (COLACE) 100 MG capsule Take 1 capsule (100 mg total) by mouth every 12 (twelve) hours. Patient taking differently: Take 100 mg by mouth every 12 (twelve) hours.  04/03/14  Yes Junius Creamer, NP  doxazosin (CARDURA) 8 MG tablet Take 8 mg by mouth at bedtime.  02/22/15  Yes Historical Provider, MD  furosemide (LASIX) 80 MG tablet Take 160 mg by mouth 2 (two) times daily.    Yes Historical Provider, MD  isosorbide-hydrALAZINE (BIDIL) 20-37.5 MG tablet Take 0.5  tablets by mouth 2 (two) times daily.   Yes Historical Provider, MD  levothyroxine (SYNTHROID, LEVOTHROID) 25 MCG tablet Take 25 mcg by mouth daily.  01/29/14  Yes Historical Provider, MD  metoprolol (LOPRESSOR) 100 MG tablet Take 150 mg by mouth 2 (two) times daily.    Yes Historical Provider, MD  NIFEdipine (PROCARDIA XL/ADALAT-CC) 90 MG 24 hr tablet Take 90 mg by mouth daily.   Yes Historical Provider, MD  OVER THE COUNTER MEDICATION  Take 1 capsule by mouth 2 (two) times daily. "Kidney Factor"   Yes Historical Provider, MD  oxyCODONE-acetaminophen (PERCOCET/ROXICET) 5-325 MG per tablet Take 1 tablet by mouth every 6 (six) hours as needed. Patient not taking: Reported on 02/01/2015 10/18/14   Ulyses Amor, PA-C  potassium chloride SA (K-DUR,KLOR-CON) 20 MEQ tablet Take 20 mEq by mouth 2 (two) times daily. Reported on 04/13/2015    Historical Provider, MD    Physical Exam: Filed Vitals:   04/13/15 2030 04/13/15 2045 04/13/15 2100 04/13/15 2142  BP: 133/78 150/81 133/84 134/69  Pulse: 77 80 73 84  Temp:    98 F (36.7 C)  TempSrc:    Oral  Resp: 15 23 16 18   Height:    5\' 11"  (1.803 m)  Weight:    193 lb 1.6 oz (87.59 kg)  SpO2: 96% 95% 97% 97%     General:  Moderately built and nourished.  Eyes: Anicteric no pallor.  ENT: No discharge from the ears eyes nose or mouth.  Neck: No mass felt.  Cardiovascular: S1 and S2 heard.  Respiratory: No rhonchi or crepitations.  Abdomen: Soft nontender bowel sounds present.  Skin: No rash.  Musculoskeletal: No edema.  Psychiatric: Appears normal.  Neurologic: Alert awake oriented to time place and person. Right-sided hemiparesis.  Labs on Admission:  Basic Metabolic Panel:  Recent Labs Lab 04/13/15 1644  NA 137  K 3.3*  CL 97*  CO2 21*  GLUCOSE 120*  BUN 77*  CREATININE 6.48*  CALCIUM 9.3   Liver Function Tests:  Recent Labs Lab 04/13/15 1644  AST 24  ALT 28  ALKPHOS 48  BILITOT 0.6  PROT 8.2*  ALBUMIN 3.4*   No results for input(s): LIPASE, AMYLASE in the last 168 hours. No results for input(s): AMMONIA in the last 168 hours. CBC:  Recent Labs Lab 04/13/15 1644  WBC 8.3  NEUTROABS 6.9  HGB 9.6*  HCT 30.0*  MCV 88.0  PLT 298   Cardiac Enzymes: No results for input(s): CKTOTAL, CKMB, CKMBINDEX, TROPONINI in the last 168 hours.  BNP (last 3 results)  Recent Labs  06/29/14 1810  BNP 74.9    ProBNP (last 3 results) No  results for input(s): PROBNP in the last 8760 hours.  CBG:  Recent Labs Lab 04/13/15 1616  GLUCAP 106*    Radiological Exams on Admission: Ct Head Wo Contrast  04/13/2015  CLINICAL DATA:  Mental status change EXAM: CT HEAD WITHOUT CONTRAST TECHNIQUE: Contiguous axial images were obtained from the base of the skull through the vertex without intravenous contrast. COMPARISON:  April 02, 2015 FINDINGS: A right-sided burr hole is again identified. Paranasal sinuses and mastoid air cells are within normal limits. Visualized bones are otherwise unremarkable. Other than over the site of the burr hole, the soft tissues are normal. The left-sided hygroma which is CSF in attenuation is unchanged. The hygroma measures 5 mm, stable in the interval. The right-sided subdural collection is smaller in the interval with a maximum dimension of 7.5  mm today versus 9.5 mm previously. Much of the right-sided collection is CSF in attenuation. Inferiorly, the attenuation is mixed with a small amount of high attenuation such as on image 15. This finding is not significantly changed. The attenuation of the subdural collection more superiorly is mildly increased in attenuation in the interval. There is a small component of possible acute blood measuring 5 mm in thickness on image 23. No midline shift. The ventricles and sulci are stable. A lacunar infarct on the left is unchanged. The cerebellum and brainstem are normal. Basal cisterns are patent. No evidence of acute cortical ischemia or infarct. IMPRESSION: 1. The patient has a history of subdural hemorrhages/hygromas. The right-sided subdural collection is smaller in the interval. However, there is increased attenuation within the collection superiorly consistent with a small amount of subacute and/or acute hemorrhage. 2. The left sided hygroma demonstrates no acute components. 3. No other acute abnormalities. The findings were called to Dr. Tomi Bamberger. Electronically Signed   By:  Dorise Bullion III M.D   On: 04/13/2015 18:19    EKG: Independently reviewed. Normal sinus rhythm with acceptable QTC.  Assessment/Plan Principal Problem:   Syncope Active Problems:   Chronic kidney disease (CKD), stage IV (severe) (HCC)   Anemia in chronic kidney disease   Essential hypertension   Faintness   1. Syncope most likely postictal secondary to seizures - I have discussed with on-call neurologist Dr. Wendee Beavers who at this time strongly feels that patient's symptoms are secondary to seizures and has been started on Keppra twice a day. EEG has been ordered. Closely monitor in telemetry. 2. Recent subdural hematoma status post burr hole placement - per neurosurgery. 3. Hypertension - continue clonidine Bidil and metoprolol. 4. Chronic kidney disease stage IV to 5 - continue Lasix. 5. Chronic anemia - follow CBC. 6. History of stroke with right-sided hemiparesis - resume aspirin once okay with neurosurgery.   DVT ProphylaxisSCDs.  Code Status: Full code.  Family Communication: Discussed with patient.  Disposition Plan: Admit for observation.    Everline Mahaffy N. [facility] Pager [phone].  If 7PM-7AM, please contact night-coverage [email or web address] Password [email or web address] 04/13/2015, 11:10 PM

## 2015-04-13 NOTE — ED Notes (Signed)
Stroke swallow screen passed per day shift RN

## 2015-04-13 NOTE — ED Notes (Signed)
Pt sat up to sit on side of the bed to attempt to collect a urine sample

## 2015-04-13 NOTE — ED Provider Notes (Signed)
CSN: [id number]     Arrival date & time 04/13/15  1535 History   First MD Initiated Contact with Patient 04/13/15 1548     Chief Complaint  Patient presents with  . Altered Mental Status    HPI Pt was sitting in his office chair.  Family went to check on him and he would not respond.  They had to shake him a few times and he would still not respond.  EMS was called.  Pt remained unresponsive.  He was brought to the ED and on the way over he became alert and talkative.   Pt remembers sitting in his chair in the office feeling sleepy.  He does not recall  His family trying to wake him up.  He does recall EMS being at his house.  He denies any complaints right now.  He does have history of brain surgery.  He does have history of seizures but has never had a spell like this where he has been unresponsive. Past Medical History  Diagnosis Date  . Hypertension   . Stroke [facility])     right side paralysis  . Hypothyroidism   . Diabetes mellitus without complication ([place])     not on medications  . Chronic kidney disease     stage 5  . Constipation    Past Surgical History  Procedure Laterality Date  . Hemorrhoid surgery    . Av fistula placement Left 10/18/2014    Procedure: ARTERIOVENOUS (AV) FISTULA CREATION;  Surgeon: Angelia Mould, MD;  Location: [place];  Service: Vascular;  Laterality: Left;  . Burr hole Right 03/17/2015    Procedure: BURR HOLES FOR RIGHT SUBDURAL HEMATOMA;  Surgeon: Leeroy Cha, MD;  Location: [place] [facility];  Service: Neurosurgery;  Laterality: Right;   Family History  Problem Relation Age of Onset  . Hypertension Mother    Social History  Substance Use Topics  . Smoking status: Former Smoker    Types: Pipe    Quit date: 09/22/1984  . Smokeless tobacco: Never Used  . Alcohol Use: No    Review of Systems  Constitutional: Negative for fever.  Respiratory: Negative for shortness of breath.   Gastrointestinal: Positive for vomiting (he atrributes to his  kidneys, this happens occasionally). Negative for abdominal pain.  Neurological: Negative for headaches.      Allergies  Review of patient's allergies indicates no known allergies.  Home Medications   Prior to Admission medications   Medication Sig Start Date End Date Taking? Authorizing Provider  aspirin 325 MG tablet Take 325 mg by mouth daily.   Yes Historical Provider, MD  calcium acetate (PHOSLO) 667 MG capsule Take 2 capsules by mouth 3 (three) times daily before meals. 04/06/15  Yes Historical Provider, MD  cloNIDine (CATAPRES) 0.2 MG tablet Take 0.4 mg by mouth 2 (two) times daily.    Yes Historical Provider, MD  docusate sodium (COLACE) 100 MG capsule Take 1 capsule (100 mg total) by mouth every 12 (twelve) hours. Patient taking differently: Take 100 mg by mouth every 12 (twelve) hours.  04/03/14  Yes Junius Creamer, NP  doxazosin (CARDURA) 8 MG tablet Take 8 mg by mouth at bedtime.  02/22/15  Yes Historical Provider, MD  furosemide (LASIX) 80 MG tablet Take 160 mg by mouth 2 (two) times daily.    Yes Historical Provider, MD  isosorbide-hydrALAZINE (BIDIL) 20-37.5 MG tablet Take 0.5 tablets by mouth 2 (two) times daily.   Yes Historical Provider, MD  levothyroxine (SYNTHROID, LEVOTHROID)  25 MCG tablet Take 25 mcg by mouth daily.  01/29/14  Yes Historical Provider, MD  metoprolol (LOPRESSOR) 100 MG tablet Take 150 mg by mouth 2 (two) times daily.    Yes Historical Provider, MD  NIFEdipine (PROCARDIA XL/ADALAT-CC) 90 MG 24 hr tablet Take 90 mg by mouth daily.   Yes Historical Provider, MD  OVER THE COUNTER MEDICATION Take 1 capsule by mouth 2 (two) times daily. "Kidney Factor"   Yes Historical Provider, MD  oxyCODONE-acetaminophen (PERCOCET/ROXICET) 5-325 MG per tablet Take 1 tablet by mouth every 6 (six) hours as needed. Patient not taking: Reported on 02/01/2015 10/18/14   Ulyses Amor, PA-C  potassium chloride SA (K-DUR,KLOR-CON) 20 MEQ tablet Take 20 mEq by mouth 2 (two) times daily.  Reported on 04/13/2015    Historical Provider, MD   BP 128/84 mmHg  Pulse 84  Temp(Src) 98.1 F (36.7 C) (Oral)  Resp 18  Ht 5\' 11"  (1.803 m)  Wt 92.987 kg  BMI 28.60 kg/m2  SpO2 97% Physical Exam  Constitutional: He appears well-developed and well-nourished. No distress.  HENT:  Head: Normocephalic and atraumatic.  Right Ear: External ear normal.  Left Ear: External ear normal.  Eyes: Conjunctivae are normal. Right eye exhibits no discharge. Left eye exhibits no discharge. No scleral icterus.  Neck: Neck supple. No tracheal deviation present.  Cardiovascular: Normal rate, regular rhythm and intact distal pulses.   Pulmonary/Chest: Effort normal and breath sounds normal. No stridor. No respiratory distress. He has no wheezes. He has no rales.  Abdominal: Soft. Bowel sounds are normal. He exhibits no distension. There is no tenderness. There is no rebound and no guarding.  Musculoskeletal: He exhibits no edema or tenderness.  Neurological: He is alert. He has normal strength. No cranial nerve deficit (no facial droop, extraocular movements intact, no slurred speech) or sensory deficit. He exhibits normal muscle tone. He displays no seizure activity. Coordination normal.  Right sided hemiparesis, right facial droop  Skin: Skin is warm and dry. No rash noted.  Psychiatric: He has a normal mood and affect.  Nursing note and vitals reviewed.   ED Course  Procedures (including critical care time) Labs Review Labs Reviewed  COMPREHENSIVE METABOLIC PANEL - Abnormal; Notable for the following:    Potassium 3.3 (*)    Chloride 97 (*)    CO2 21 (*)    Glucose, Bld 120 (*)    BUN 77 (*)    Creatinine, Ser 6.48 (*)    Total Protein 8.2 (*)    Albumin 3.4 (*)    GFR calc non Af Amer 8 (*)    GFR calc Af Amer 9 (*)    Anion gap 19 (*)    All other components within normal limits  CBC - Abnormal; Notable for the following:    RBC 3.41 (*)    Hemoglobin 9.6 (*)    HCT 30.0 (*)    All  other components within normal limits  APTT - Abnormal; Notable for the following:    aPTT 40 (*)    All other components within normal limits  DIFFERENTIAL - Abnormal; Notable for the following:    Lymphs Abs 0.5 (*)    All other components within normal limits  CBG MONITORING, ED - Abnormal; Notable for the following:    Glucose-Capillary 106 (*)    All other components within normal limits  PROTIME-INR  URINALYSIS, ROUTINE W REFLEX MICROSCOPIC (NOT AT [facility])  Randolm Idol, ED    Imaging Review Ct  Head Wo Contrast  04/13/2015  CLINICAL DATA:  Mental status change EXAM: CT HEAD WITHOUT CONTRAST TECHNIQUE: Contiguous axial images were obtained from the base of the skull through the vertex without intravenous contrast. COMPARISON:  April 02, 2015 FINDINGS: A right-sided burr hole is again identified. Paranasal sinuses and mastoid air cells are within normal limits. Visualized bones are otherwise unremarkable. Other than over the site of the burr hole, the soft tissues are normal. The left-sided hygroma which is CSF in attenuation is unchanged. The hygroma measures 5 mm, stable in the interval. The right-sided subdural collection is smaller in the interval with a maximum dimension of 7.5 mm today versus 9.5 mm previously. Much of the right-sided collection is CSF in attenuation. Inferiorly, the attenuation is mixed with a small amount of high attenuation such as on image 15. This finding is not significantly changed. The attenuation of the subdural collection more superiorly is mildly increased in attenuation in the interval. There is a small component of possible acute blood measuring 5 mm in thickness on image 23. No midline shift. The ventricles and sulci are stable. A lacunar infarct on the left is unchanged. The cerebellum and brainstem are normal. Basal cisterns are patent. No evidence of acute cortical ischemia or infarct. IMPRESSION: 1. The patient has a history of subdural  hemorrhages/hygromas. The right-sided subdural collection is smaller in the interval. However, there is increased attenuation within the collection superiorly consistent with a small amount of subacute and/or acute hemorrhage. 2. The left sided hygroma demonstrates no acute components. 3. No other acute abnormalities. The findings were called to Dr. Tomi Bamberger. Electronically Signed   By: Dorise Bullion III M.D   On: 04/13/2015 18:19   I have personally reviewed and evaluated these images and lab results as part of my medical decision-making. Reviewed CT findings with radiology.  ?subacute to acute blood products on the scan.  EKG Interpretation   Date/Time:  Thursday April 13 2015 16:31:09 EDT Ventricular Rate:  76 PR Interval:  183 QRS Duration: 94 QT Interval:  353 QTC Calculation: 397 R Axis:   -14 Text Interpretation:  Sinus rhythm baseline artifact resolved since prior  tracing Confirmed by Addison Whidbee  MD-J, Lylliana Kitamura [id number]) on 04/13/2015 4:38:24 PM      MDM   Final diagnoses:  Syncope, unspecified syncope type    The patient's CT scan shows the possibility of new collections of blood in his chronic right-sided subdural collection. I discussed the findings with Dr. Joya Salm. He reviewed the films. He does not feel the patient requires any further intervention. He recently saw him in the office this week in follow-up.  The patient's episode of unresponsiveness suggests possibility of either syncopal event or seizure. He does have history of seizures and its possible the family witnessed a post ictal phase however he was not confused after the event which argues against seizure and postictal state. Possible he also had a syncopal episode.  Patient's laboratory tests are otherwise stable. He has multiple comorbidities. I will consult with medical service regarding overnight observation for his syncopal event.    Dorie Rank, MD 04/13/15 [phone]

## 2015-04-13 NOTE — ED Notes (Signed)
Pt transported to CT ?

## 2015-04-13 NOTE — ED Notes (Signed)
Pt was sitting in his office at 1416, pt's wife could not wake pt up. Pt unresponsive for EMS. GCS 3, incontinent. Shallow respirations manual BP 90 systolic. Pt had stroke 19 years ago with right sided deficits and a fall in Feb with hemorrhagic bleed. No worsening deficits. Per EMS, pt is alert and oriented at this time. EKG shows SR, no complaints of pain. BP 135/85, CBG 134, HR 62, 97% on room air.

## 2015-04-13 NOTE — Progress Notes (Signed)
Pt admitted to room 522 alert and oriented x 4. Right arm and leg with slight movement from old CVA. I.v patent , vss, patient instructed on safety and the proper use of call bed. Pt verbalised understanding of safety and fall risk and proper teachback on call bell.

## 2015-04-14 ENCOUNTER — Observation Stay (HOSPITAL_COMMUNITY): Payer: PRIVATE HEALTH INSURANCE

## 2015-04-14 DIAGNOSIS — I10 Essential (primary) hypertension: Secondary | ICD-10-CM

## 2015-04-14 DIAGNOSIS — D631 Anemia in chronic kidney disease: Secondary | ICD-10-CM

## 2015-04-14 DIAGNOSIS — N189 Chronic kidney disease, unspecified: Secondary | ICD-10-CM

## 2015-04-14 DIAGNOSIS — T670XXS Heatstroke and sunstroke, sequela: Secondary | ICD-10-CM | POA: Diagnosis not present

## 2015-04-14 DIAGNOSIS — R55 Syncope and collapse: Secondary | ICD-10-CM | POA: Diagnosis not present

## 2015-04-14 DIAGNOSIS — N184 Chronic kidney disease, stage 4 (severe): Secondary | ICD-10-CM | POA: Diagnosis not present

## 2015-04-14 LAB — COMPREHENSIVE METABOLIC PANEL
ALBUMIN: 2.6 g/dL — AB (ref 3.5–5.0)
ALT: 21 U/L (ref 17–63)
ANION GAP: 11 (ref 5–15)
AST: 17 U/L (ref 15–41)
Alkaline Phosphatase: 38 U/L (ref 38–126)
BILIRUBIN TOTAL: 0.6 mg/dL (ref 0.3–1.2)
BUN: 74 mg/dL — AB (ref 6–20)
CHLORIDE: 103 mmol/L (ref 101–111)
CO2: 24 mmol/L (ref 22–32)
Calcium: 8.6 mg/dL — ABNORMAL LOW (ref 8.9–10.3)
Creatinine, Ser: 6.27 mg/dL — ABNORMAL HIGH (ref 0.61–1.24)
GFR calc Af Amer: 10 mL/min — ABNORMAL LOW (ref >60)
GFR, EST NON AFRICAN AMERICAN: 8 mL/min — AB (ref >60)
Glucose, Bld: 103 mg/dL — ABNORMAL HIGH (ref 65–99)
POTASSIUM: 2.9 mmol/L — AB (ref 3.5–5.1)
Sodium: 138 mmol/L (ref 135–145)
TOTAL PROTEIN: 6.3 g/dL — AB (ref 6.5–8.1)

## 2015-04-14 LAB — CBC
HEMATOCRIT: 23.1 % — AB (ref 39.0–52.0)
HEMOGLOBIN: 7.8 g/dL — AB (ref 13.0–17.0)
MCH: 29.3 pg (ref 26.0–34.0)
MCHC: 33.8 g/dL (ref 30.0–36.0)
MCV: 86.8 fL (ref 78.0–100.0)
Platelets: 276 10*3/uL (ref 150–400)
RBC: 2.66 MIL/uL — ABNORMAL LOW (ref 4.22–5.81)
RDW: 13.4 % (ref 11.5–15.5)
WBC: 5.7 10*3/uL (ref 4.0–10.5)

## 2015-04-14 LAB — URINALYSIS, ROUTINE W REFLEX MICROSCOPIC
BILIRUBIN URINE: NEGATIVE
Glucose, UA: NEGATIVE mg/dL
KETONES UR: NEGATIVE mg/dL
Leukocytes, UA: NEGATIVE
NITRITE: NEGATIVE
PH: 5.5 (ref 5.0–8.0)
Protein, ur: 100 mg/dL — AB
Specific Gravity, Urine: 1.012 (ref 1.005–1.030)

## 2015-04-14 LAB — URINE MICROSCOPIC-ADD ON: RBC / HPF: NONE SEEN RBC/hpf (ref 0–5)

## 2015-04-14 LAB — PREPARE RBC (CROSSMATCH)

## 2015-04-14 MED ORDER — POTASSIUM CHLORIDE CRYS ER 20 MEQ PO TBCR
40.0000 meq | EXTENDED_RELEASE_TABLET | ORAL | Status: AC
  Administered 2015-04-14 (×2): 40 meq via ORAL
  Filled 2015-04-14 (×2): qty 2

## 2015-04-14 MED ORDER — ASPIRIN 325 MG PO TABS: 325.0000 mg | ORAL_TABLET | Freq: Every day | ORAL | Status: AC

## 2015-04-14 MED ORDER — LEVETIRACETAM 500 MG PO TABS: 500.0000 mg | ORAL_TABLET | Freq: Two times a day (BID) | ORAL | Status: DC

## 2015-04-14 MED ORDER — SODIUM CHLORIDE 0.9 % IV SOLN: Freq: Once | INTRAVENOUS | Status: DC

## 2015-04-14 MED ORDER — POTASSIUM CHLORIDE CRYS ER 20 MEQ PO TBCR: 20.0000 meq | EXTENDED_RELEASE_TABLET | Freq: Three times a day (TID) | ORAL | Status: DC

## 2015-04-14 NOTE — Progress Notes (Signed)
RN text paged MD to make MD aware of CT results and MD called back stating pt is okay for discharge and pt needs to follow up with MD [facility]. Dorita Fray 04/14/2015 5:50 PM

## 2015-04-14 NOTE — Discharge Instructions (Signed)
Please follow with hematology/oncolgogy for anemia ,

## 2015-04-14 NOTE — Discharge Summary (Addendum)
Physician Discharge Summary  Javier Jenkins MRN: [id number] DOB/AGE: Nov 05, 1950 65 y.o.  PCP: Gwynneth Aliment, MD   Admit date: 04/13/2015 Discharge date: 04/14/2015  Discharge Diagnoses:     Principal Problem:   Syncope Active Problems:   Chronic kidney disease (CKD), stage IV (severe) (HCC)   Anemia in chronic kidney disease   Essential hypertension   Faintness    Follow-up recommendations Follow-up with PCP in 3-5 days , including all  additional recommended appointments as below Follow-up CBC, CMP in 3-5 days Patient is status post transfusion of one unit of packed red blood cells  Please follow with hematology/oncolgogy for anemia ,    Medication List    STOP taking these medications        NIFEdipine 90 MG 24 hr tablet  Commonly known as:  PROCARDIA XL/ADALAT-CC      TAKE these medications        aspirin 325 MG tablet  Take 1 tablet (325 mg total) by mouth daily.  Start taking on:  04/21/2015     calcium acetate 667 MG capsule  Commonly known as:  PHOSLO  Take 2 capsules by mouth 3 (three) times daily before meals.     cloNIDine 0.2 MG tablet  Commonly known as:  CATAPRES  Take 0.4 mg by mouth 2 (two) times daily.     docusate sodium 100 MG capsule  Commonly known as:  COLACE  Take 1 capsule (100 mg total) by mouth every 12 (twelve) hours.     doxazosin 8 MG tablet  Commonly known as:  CARDURA  Take 8 mg by mouth at bedtime.     furosemide 80 MG tablet  Commonly known as:  LASIX  Take 160 mg by mouth 2 (two) times daily.     isosorbide-hydrALAZINE 20-37.5 MG tablet  Commonly known as:  BIDIL  Take 0.5 tablets by mouth 2 (two) times daily.     levETIRAcetam 500 MG tablet  Commonly known as:  KEPPRA  Take 1 tablet (500 mg total) by mouth 2 (two) times daily.     levothyroxine 25 MCG tablet  Commonly known as:  SYNTHROID, LEVOTHROID  Take 25 mcg by mouth daily.     metoprolol 100 MG tablet  Commonly known as:  LOPRESSOR  Take 150 mg by  mouth 2 (two) times daily.     OVER THE COUNTER MEDICATION  Take 1 capsule by mouth 2 (two) times daily. "Kidney Factor"     oxyCODONE-acetaminophen 5-325 MG tablet  Commonly known as:  PERCOCET/ROXICET  Take 1 tablet by mouth every 6 (six) hours as needed.     potassium chloride SA 20 MEQ tablet  Commonly known as:  K-DUR,KLOR-CON  Take 1 tablet (20 mEq total) by mouth 3 (three) times daily. Reported on 04/13/2015         Discharge Condition:  Stable   Discharge Instructions Get Medicines reviewed and adjusted: Please take all your medications with you for your next visit with your Primary MD  Please request your Primary MD to go over all hospital tests and procedure/radiological results at the follow up, please ask your Primary MD to get all Hospital records sent to his/her office.  If you experience worsening of your admission symptoms, develop shortness of breath, life threatening emergency, suicidal or homicidal thoughts you must seek medical attention immediately by calling 911 or calling your MD immediately if symptoms less severe.  You must read complete instructions/literature along with all the possible adverse reactions/side effects  for all the Medicines you take and that have been prescribed to you. Take any new Medicines after you have completely understood and accpet all the possible adverse reactions/side effects.   Do not drive when taking Pain medications.   Do not take more than prescribed Pain, Sleep and Anxiety Medications  Special Instructions: If you have smoked or chewed Tobacco in the last 2 yrs please stop smoking, stop any regular Alcohol and or any Recreational drug use.  Wear Seat belts while driving.  Please note  You were cared for by a hospitalist during your hospital stay. Once you are discharged, your primary care physician will handle any further medical issues. Please note that NO REFILLS for any discharge medications will be authorized  once you are discharged, as it is imperative that you return to your primary care physician (or establish a relationship with a primary care physician if you do not have one) for your aftercare needs so that they can reassess your need for medications and monitor your lab values.      Discharge Instructions    Diet - low sodium heart healthy    Complete by:  As directed      Increase activity slowly    Complete by:  As directed            No Known Allergies    Disposition: 01-Home or Self Care   Consults:  Neurology     Significant Diagnostic Studies:  Ct Head Wo Contrast  04/13/2015  CLINICAL DATA:  Mental status change EXAM: CT HEAD WITHOUT CONTRAST TECHNIQUE: Contiguous axial images were obtained from the base of the skull through the vertex without intravenous contrast. COMPARISON:  April 02, 2015 FINDINGS: A right-sided burr hole is again identified. Paranasal sinuses and mastoid air cells are within normal limits. Visualized bones are otherwise unremarkable. Other than over the site of the burr hole, the soft tissues are normal. The left-sided hygroma which is CSF in attenuation is unchanged. The hygroma measures 5 mm, stable in the interval. The right-sided subdural collection is smaller in the interval with a maximum dimension of 7.5 mm today versus 9.5 mm previously. Much of the right-sided collection is CSF in attenuation. Inferiorly, the attenuation is mixed with a small amount of high attenuation such as on image 15. This finding is not significantly changed. The attenuation of the subdural collection more superiorly is mildly increased in attenuation in the interval. There is a small component of possible acute blood measuring 5 mm in thickness on image 23. No midline shift. The ventricles and sulci are stable. A lacunar infarct on the left is unchanged. The cerebellum and brainstem are normal. Basal cisterns are patent. No evidence of acute cortical ischemia or infarct.  IMPRESSION: 1. The patient has a history of subdural hemorrhages/hygromas. The right-sided subdural collection is smaller in the interval. However, there is increased attenuation within the collection superiorly consistent with a small amount of subacute and/or acute hemorrhage. 2. The left sided hygroma demonstrates no acute components. 3. No other acute abnormalities. The findings were called to Dr. Tomi Bamberger. Electronically Signed   By: Dorise Bullion III M.D   On: 04/13/2015 18:19   Ct Head Wo Contrast  04/02/2015  CLINICAL DATA:  Two weeks status post craniotomy acute old right subdural hematoma. Left arm shaking and possible seizure. EXAM: CT HEAD WITHOUT CONTRAST TECHNIQUE: Contiguous axial images were obtained from the base of the skull through the vertex without intravenous contrast. COMPARISON:  03/22/2015 FINDINGS:  Right posterior frontal burr hole again demonstrated. Right frontal subdural hygroma in right frontal pneumocephalus is decreased since previous study slightly, now measuring 10 mm in maximal thickness on image 17/series 201 compared to 11 mm previously. A small low-attenuation left frontal subdural hygroma also remains stable measuring approximately 5 mm in thickness. No evidence of acute intracranial hemorrhage or herniation. Ventricles remain stable in size. Diffuse cerebral atrophy and old left deep periventricular white matter infarct show no significant change. IMPRESSION: Mild decrease in size of right frontal subdural hygroma. Stable small left frontal subdural hygroma. No acute findings or midline shift. Electronically Signed   By: Earle Gell M.D.   On: 04/02/2015 15:26   Ct Head Wo Contrast  03/22/2015  CLINICAL DATA:  Continued surveillance subdural hematoma evacuation. Subsequent encounter. EXAM: CT HEAD WITHOUT CONTRAST TECHNIQUE: Contiguous axial images were obtained from the base of the skull through the vertex without intravenous contrast. COMPARISON:  Multiple priors, most  recent CT 03/20/2015. FINDINGS: No acute stroke or parenchymal hemorrhage. No mass lesion or hydrocephalus. Surgical drain has been removed. There is re-accumulation of the extracerebral low attenuation fluid on the RIGHT, most consistent with subdural hygroma. There has also been increase in pneumocephalus predominantly RIGHT frontal. No significant midline shift. Maximum thickness of the RIGHT subdural hygroma is 11 mm. Small LEFT subdural hygroma stable, 4-5 mm thick. RIGHT posterior frontal burr hole. Mild atrophy. No sinus or mastoid air fluid level. IMPRESSION: RIGHT subdural drain has been removed since the previous scan. There is increase in RIGHT subdural hygroma up to 11 mm thick. Increase in pneumocephalus as well. No significant midline shift. Electronically Signed   By: Staci Righter M.D.   On: 03/22/2015 16:33   Ct Head Wo Contrast  03/20/2015  CLINICAL DATA:  Initial postoperative valuation. EXAM: CT HEAD WITHOUT CONTRAST TECHNIQUE: Contiguous axial images were obtained from the base of the skull through the vertex without intravenous contrast. COMPARISON:  Prior CT from 03/17/2015. FINDINGS: Postoperative changes from interval partial evacuation of previously identified mixed attenuation right subdural hematoma seen. Trudee Kuster hole now in place at the right frontal parietal calvarium. Subdural drain reverses the burr hole and lines within the right extra-axial space, terminating over the right parietal lobe. Previously seen right subdural collection is markedly decreased in size, now measuring up to 1 cm at the level of left frontal convexity. Scattered postoperative pneumocephalus present within the right extra-axial space. Small amount of residual hyperdense blood products present. No complication identified. Previously seen right-to-left shift has essentially resolved, with no appreciable shift now identified. No hydrocephalus. Basilar cisterns are patent. Minimal persistent right uncal herniation.  Mild prominence of the left extra-axial space is similar. Remote lacunar infarct within the left basal ganglia/ corona radiata noted. Age-related cerebral atrophy with mild chronic small vessel ischemic disease. No acute large vessel territory infarct. No mass lesion. No mass effect. Skin staples in place at the right frontal parietal scalp. No acute abnormality about the orbits. Minimal mucosal thickening within the max O sinuses. Paranasal sinuses are otherwise largely clear. No mastoid effusion. IMPRESSION: 1. Postoperative changes from interval evacuation of right subdural hematoma with subdural drain in place. Right subdural collection markedly reduced now measuring up to 1 cm at the level the right frontal convexity. Resolved right-to-left shift. 2. No other new intracranial process. Electronically Signed   By: Jeannine Boga M.D.   On: 03/20/2015 06:14   Ct Head Wo Contrast  03/17/2015  CLINICAL DATA:  65 year old male with headache  x1 month. No injury. Patient presenting with altered mental status and vomiting. EXAM: CT HEAD WITHOUT CONTRAST TECHNIQUE: Contiguous axial images were obtained from the base of the skull through the vertex without intravenous contrast. COMPARISON:  CT dated 06/29/2014 an MRI dated 03/17/2015 FINDINGS: There is a large right frontoparietal subdural collection with mixed attenuating content. In measures approximately 2.5 cm in greatest transverse axial dimension over the right frontal lobe. This collection primarily demonstrates a lower attenuation likely related to old hematoma/hygroma. Areas of higher density noted layering within this collection compatible with acute or subacute subdural hemorrhage. There is mass effect on the right hemisphere with near complete effacement of the right lateral ventricle approximately 6 mm right-to-left midline shift measured at the level of the foramen Monro. A 3 mm high attenuation along the posterior falx may be artifactual or  represent a small parafalcine subdural hemorrhage. No intraventricular hemorrhage identified. There is no intraparenchymal hemorrhage. There is diffuse age-related volume loss. A small old infarct and encephalomalacia noted in the left periventricular white matter/corona radiata. The visualized paranasal sinuses and mastoid air cells are clear. The calvarium is intact. IMPRESSION: Large right frontoparietal subdural hemorrhage with mixed attenuating blood likely representing acute on chronic hemorrhage. There is mass effect and approximately 6 mm right-to-left midline shift. These results were called by telephone at the time of interpretation on 03/17/2015 at 8:41 pm to Dr. Shirlyn Goltz , who verbally acknowledged these results. Electronically Signed   By: Anner Crete M.D.   On: 03/17/2015 20:48   Mr Brain Wo Contrast  03/17/2015  CLINICAL DATA:  Headache several weeks. Difficulty ambulating. Leg weakness. EXAM: MRI HEAD WITHOUT CONTRAST TECHNIQUE: Multiplanar, multiecho pulse sequences of the brain and surrounding structures were obtained without intravenous contrast. COMPARISON:  CT head 06/29/2014 FINDINGS: Large subdural hematoma on the right. This is largely serum but there are layering blood products dependently. There are septations. 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 There is mass-effect on the right hemisphere with 10 mm midline shift to the left. The fluid collection measures 22 mm in the frontal region and 15 mm in the parietal region. Negative for hydrocephalus. No acute infarct. Chronic infarct in the left deep white matter unchanged from the prior CT. Negative for mass lesion. IMPRESSION: Large right-sided subdural hematoma which appears subacute to chronic. There is mass-effect in midline shift measuring 10 mm. Chronic infarct in the deep white matter on the left. No acute infarct. Critical Value/emergent results were called by telephone at the time of interpretation on  03/17/2015 at 8:40 pm to Dr. Shirlyn Goltz , who verbally acknowledged these results. Electronically Signed   By: Franchot Gallo M.D.   On: 03/17/2015 20:41          Filed Weights   04/13/15 1600 04/13/15 2142  Weight: 92.987 kg (205 lb) 87.59 kg (193 lb 1.6 oz)     Microbiology: No results found for this or any previous visit (from the past 240 hour(s)).     Blood Culture No results found for: SDES, [place], CULT, REPTSTATUS    Labs: Results for orders placed or performed during the hospital encounter of 04/13/15 (from the past 48 hour(s))  CBG monitoring, ED     Status: Abnormal   Collection Time: 04/13/15  4:16 PM  Result Value Ref Range   Glucose-Capillary 106 (H) 65 - 99 mg/dL  Comprehensive metabolic panel     Status: Abnormal   Collection Time: 04/13/15  4:44 PM  Result  Value Ref Range   Sodium 137 135 - 145 mmol/L   Potassium 3.3 (L) 3.5 - 5.1 mmol/L   Chloride 97 (L) 101 - 111 mmol/L   CO2 21 (L) 22 - 32 mmol/L   Glucose, Bld 120 (H) 65 - 99 mg/dL   BUN 77 (H) 6 - 20 mg/dL   Creatinine, Ser 6.48 (H) 0.61 - 1.24 mg/dL   Calcium 9.3 8.9 - 10.3 mg/dL   Total Protein 8.2 (H) 6.5 - 8.1 g/dL   Albumin 3.4 (L) 3.5 - 5.0 g/dL   AST 24 15 - 41 U/L   ALT 28 17 - 63 U/L   Alkaline Phosphatase 48 38 - 126 U/L   Total Bilirubin 0.6 0.3 - 1.2 mg/dL   GFR calc non Af Amer 8 (L) >60 mL/min   GFR calc Af Amer 9 (L) >60 mL/min    Comment: (NOTE) The eGFR has been calculated using the CKD EPI equation. This calculation has not been validated in all clinical situations. eGFR's persistently <60 mL/min signify possible Chronic Kidney Disease.    Anion gap 19 (H) 5 - 15  CBC     Status: Abnormal   Collection Time: 04/13/15  4:44 PM  Result Value Ref Range   WBC 8.3 4.0 - 10.5 K/uL   RBC 3.41 (L) 4.22 - 5.81 MIL/uL   Hemoglobin 9.6 (L) 13.0 - 17.0 g/dL   HCT 30.0 (L) 39.0 - 52.0 %   MCV 88.0 78.0 - 100.0 fL   MCH 28.2 26.0 - 34.0 pg   MCHC 32.0 30.0 - 36.0 g/dL   RDW  13.3 11.5 - 15.5 %   Platelets 298 150 - 400 K/uL  Protime-INR     Status: None   Collection Time: 04/13/15  4:44 PM  Result Value Ref Range   Prothrombin Time 15.0 11.6 - 15.2 seconds   INR 1.16 0.00 - 1.49  APTT     Status: Abnormal   Collection Time: 04/13/15  4:44 PM  Result Value Ref Range   aPTT 40 (H) 24 - 37 seconds    Comment:        IF BASELINE aPTT IS ELEVATED, SUGGEST PATIENT RISK ASSESSMENT BE USED TO DETERMINE APPROPRIATE ANTICOAGULANT THERAPY.   Differential     Status: Abnormal   Collection Time: 04/13/15  4:44 PM  Result Value Ref Range   Neutrophils Relative % 86 %   Neutro Abs 6.9 1.7 - 7.7 K/uL   Lymphocytes Relative 7 %   Lymphs Abs 0.5 (L) 0.7 - 4.0 K/uL   Monocytes Relative 6 %   Monocytes Absolute 0.5 0.1 - 1.0 K/uL   Eosinophils Relative 1 %   Eosinophils Absolute 0.1 0.0 - 0.7 K/uL   Basophils Relative 0 %   Basophils Absolute 0.0 0.0 - 0.1 K/uL  I-stat troponin, ED (not at [facility], [facility])     Status: None   Collection Time: 04/13/15  4:56 PM  Result Value Ref Range   Troponin i, poc 0.01 0.00 - 0.08 ng/mL   Comment 3            Comment: Due to the release kinetics of cTnI, a negative result within the first hours of the onset of symptoms does not rule out myocardial infarction with certainty. If myocardial infarction is still suspected, repeat the test at appropriate intervals.   Comprehensive metabolic panel     Status: Abnormal   Collection Time: 04/14/15  6:30 AM  Result Value Ref Range  Sodium 138 135 - 145 mmol/L   Potassium 2.9 (L) 3.5 - 5.1 mmol/L   Chloride 103 101 - 111 mmol/L   CO2 24 22 - 32 mmol/L   Glucose, Bld 103 (H) 65 - 99 mg/dL   BUN 74 (H) 6 - 20 mg/dL   Creatinine, Ser 6.27 (H) 0.61 - 1.24 mg/dL   Calcium 8.6 (L) 8.9 - 10.3 mg/dL   Total Protein 6.3 (L) 6.5 - 8.1 g/dL   Albumin 2.6 (L) 3.5 - 5.0 g/dL   AST 17 15 - 41 U/L   ALT 21 17 - 63 U/L   Alkaline Phosphatase 38 38 - 126 U/L   Total Bilirubin 0.6 0.3 - 1.2 mg/dL    GFR calc non Af Amer 8 (L) >60 mL/min   GFR calc Af Amer 10 (L) >60 mL/min    Comment: (NOTE) The eGFR has been calculated using the CKD EPI equation. This calculation has not been validated in all clinical situations. eGFR's persistently <60 mL/min signify possible Chronic Kidney Disease.    Anion gap 11 5 - 15  CBC     Status: Abnormal   Collection Time: 04/14/15  6:30 AM  Result Value Ref Range   WBC 5.7 4.0 - 10.5 K/uL   RBC 2.66 (L) 4.22 - 5.81 MIL/uL   Hemoglobin 7.8 (L) 13.0 - 17.0 g/dL   HCT 23.1 (L) 39.0 - 52.0 %   MCV 86.8 78.0 - 100.0 fL   MCH 29.3 26.0 - 34.0 pg   MCHC 33.8 30.0 - 36.0 g/dL   RDW 13.4 11.5 - 15.5 %   Platelets 276 150 - 400 K/uL     Lipid Panel  No results found for: CHOL, TRIG, HDL, CHOLHDL, VLDL, LDLCALC, LDLDIRECT   No results found for: HGBA1C   Lab Results  Component Value Date   CREATININE 6.27* 04/14/2015     HPI :   65 y.o. 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 EMS was called and patient became responsive after 5-6 minutes. Patient denies any headache or any new focal deficits. Patient states that since his procedure last month for burr hole for subdural hematoma patient has been having left upper extremity shaking movements which last for a few minutes each time. Patient during the unresponsive episode had incontinence of urine but did not bite his tongue. Denies any chest pain or shortness of breath. CT head shows improvement in the subdural hematoma with some new changes. Dr. Joya Salm, on-call neurosurgeon was consulted and patient has been admitted for further observation. EKG shows normal sinus rhythm with acceptable QTC.  HOSPITAL COURSE:    Syncope-concern for seizure due to recent right-sided SDH evacuation Started on Keppra by ER and neurologist, Dr. Wendee Beavers Patient recommended not to drive several times by the neurologist and myself for at least the next 2 months, patient can only drive after being cleared by the PCP  Anemia of chronic disease, baseline hemoglobin 10, hemoglobin dropped to 7.8 No obvious visible bleeding, patient had CT abdomen pelvis to rule out retroperitoneal bleed.Showed Lower mediastinal, diffuse retroperitoneal and right pelvic internal iliac adenopathy, suspect indolent lymphoproliferative process such as lymphoma, Received one unit of packed red blood cells prior to discharge. Follow CBC  closely. Aspirin placed on hold for 1 week   Hypertension-continue  clonidine Bidil and metoprolol, nifedipine has been placed on hold pending PCP follow-up  Chronic kidney disease stage V, Lasix, followed by Dr. Florene Glen  Hypokalemia replete potassium   Discharge Exam:    Blood pressure 115/69, pulse 61, temperature 98.2 F (36.8 C), temperature source Oral, resp. rate 20, height _0  (1.803 m), weight 87.59 kg (193 lb 1.6 oz), SpO2 97 %.      Follow-up Information    Follow up with Maximino Greenland, MD. Schedule an appointment as soon as possible for a visit in 3 days.   Specialty:  Internal Medicine   Why:  Post hospital follow-up   Contact information:   [address] [place] [address] [phone]       Signed: Reyne Dumas 04/14/2015, 12:18 PM        Time spent >45 mins

## 2015-04-14 NOTE — Evaluation (Addendum)
Physical Therapy Evaluation Patient Details Name: Javier Jenkins MRN: [id number] DOB: [birth year] Today's Date: 04/14/2015   History of Present Illness   65 y.o. 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  Clinical Impression  Patient seen for evaluation, per patient he is at his mobility baseline, patient was supervision for activity and demonstrates no signs of syncope. At this time, recommend d/c home with family assist PRN and continued use of quad cane and AFO. Offered HHPT but patient decline stating he does not have coverage for more HHPT.    Follow Up Recommendations Supervision for mobility/OOB, could benefit from HHPT but declines     Equipment Recommendations  None recommended by PT    Recommendations for Other Services       Precautions / Restrictions Precautions Precautions: Fall Required Braces or Orthoses: Other Brace/Splint Other Brace/Splint: R AFO Restrictions Weight Bearing Restrictions: No      Mobility  Bed Mobility Overal bed mobility: Needs Assistance Bed Mobility: Supine to Sit     Supine to sit: Supervision     General bed mobility comments: increased time to perform, no physical assist required to come to EOB  Transfers Overall transfer level: Needs assistance Equipment used: None Transfers: Sit to/from Stand Sit to Stand: Supervision         General transfer comment: Supervision for stability  Ambulation/Gait Ambulation/Gait assistance: Supervision Ambulation Distance (Feet): 160 Feet Assistive device: None Gait Pattern/deviations: Step-to pattern;Decreased step length - left;Decreased dorsiflexion - right;Decreased weight shift to right Gait velocity: decr Gait velocity interpretation: Below normal speed for  age/gender General Gait Details: use of hand for HHA due to lack of quad cane available, patient with no true assist required. Modest instability and intentional effort for foot clearance despite AFO.   Stairs            Wheelchair Mobility    Modified Rankin (Stroke Patients Only)       Balance Overall balance assessment: Needs assistance Sitting-balance support: Feet supported Sitting balance-Leahy Scale: Good     Standing balance support: No upper extremity supported Standing balance-Leahy Scale: Fair                               Pertinent Vitals/Pain Pain Assessment: No/denies pain    Home Living Family/patient expects to be discharged to:: Private residence Living Arrangements: Spouse/significant other Available Help at Discharge: Family;Available 24 hours/day Type of Home: House Home Access: Stairs to enter Entrance Stairs-Rails: Right Entrance Stairs-Number of Steps: 3 Home Layout: One level Home Equipment: Cane - single point      Prior Function Level of Independence: Needs assistance   Gait / Transfers Assistance Needed: Independent with ambulation.    ADL's / Homemaking Assistance Needed: pt able to perform ADLs after wife sets up for him.  Wife cuts food for pt, but pt self-feeds.  pt's wife performs homemaking tasks.    Comments: patient is a Higher education careers adviser [employer]   Dominant Hand: Right (but now uses Lt UE dominantly )    Extremity/Trunk Assessment   Upper Extremity Assessment: RUE deficits/detail RUE Deficits / Details: Pt with Rt hemiparesis with long standing flexion contractures Rt UE.  Pt reports Rt UE unchanged  Lower Extremity Assessment: RLE deficits/detail RLE Deficits / Details: pt with limited AROM and strength from previous CVA. Uses AFO for mobility secondary to weakness distal LE weakness       Communication   Communication: No difficulties  Cognition Arousal/Alertness: Awake/alert Behavior  During Therapy: WFL for tasks assessed/performed Overall Cognitive Status: Within Functional Limits for tasks assessed                      General Comments      Exercises        Assessment/Plan    PT Assessment Patient needs continued PT services  PT Diagnosis Difficulty walking;Generalized weakness   PT Problem List Decreased strength;Decreased activity tolerance;Decreased balance;Decreased mobility;Decreased coordination;Decreased knowledge of use of DME  PT Treatment Interventions DME instruction;Gait training;Stair training;Functional mobility training;Therapeutic activities;Therapeutic exercise;Balance training;Neuromuscular re-education;Patient/family education   PT Goals (Current goals can be found in the Care Plan section) Acute Rehab PT Goals Patient Stated Goal: Home today PT Goal Formulation: With patient Time For Goal Achievement: 04/17/15 Potential to Achieve Goals: Good    Frequency Min 3X/week   Barriers to discharge        Co-evaluation               End of Session Equipment Utilized During Treatment: Gait belt Activity Tolerance: Patient tolerated treatment well Patient left: with call bell/phone within reach;in chair;with chair alarm set Nurse Communication: Mobility status         Time: 1409-1430 PT Time Calculation (min) (ACUTE ONLY): 21 min   Charges:   PT Evaluation $PT Eval Moderate Complexity: 1 Procedure     PT G Codes:   PT G-Codes **NOT FOR INPATIENT CLASS** Functional Assessment Tool Used: clinical judgement Functional Limitation: Mobility: Walking and moving around Mobility: Walking and Moving Around Current Status [id number]): At least 20 percent but less than 40 percent impaired, limited or restricted Mobility: Walking and Moving Around Goal Status [phone]): At least 20 percent but less than 40 percent impaired, limited or restricted Mobility: Walking and Moving Around Discharge Status [phone]): At least 20 percent but  less than 40 percent impaired, limited or restricted    Duncan Dull 04/14/2015, 4:59 PM Alben Deeds, [place] DPT  [phone]

## 2015-04-14 NOTE — Progress Notes (Signed)
Javier Jenkins discharged Home per MD order.  Discharge instructions reviewed and discussed with the patient, all questions and concerns answered. Copy of instructions given to patient and scripts called into pharmacy by discharging MD.    Medication List    STOP taking these medications        NIFEdipine 90 MG 24 hr tablet  Commonly known as:  PROCARDIA XL/ADALAT-CC      TAKE these medications        aspirin 325 MG tablet  Take 1 tablet (325 mg total) by mouth daily.  Start taking on:  04/21/2015     calcium acetate 667 MG capsule  Commonly known as:  PHOSLO  Take 2 capsules by mouth 3 (three) times daily before meals.     cloNIDine 0.2 MG tablet  Commonly known as:  CATAPRES  Take 0.4 mg by mouth 2 (two) times daily.     docusate sodium 100 MG capsule  Commonly known as:  COLACE  Take 1 capsule (100 mg total) by mouth every 12 (twelve) hours.     doxazosin 8 MG tablet  Commonly known as:  CARDURA  Take 8 mg by mouth at bedtime.     furosemide 80 MG tablet  Commonly known as:  LASIX  Take 160 mg by mouth 2 (two) times daily.     isosorbide-hydrALAZINE 20-37.5 MG tablet  Commonly known as:  BIDIL  Take 0.5 tablets by mouth 2 (two) times daily.     levETIRAcetam 500 MG tablet  Commonly known as:  KEPPRA  Take 1 tablet (500 mg total) by mouth 2 (two) times daily.     levothyroxine 25 MCG tablet  Commonly known as:  SYNTHROID, LEVOTHROID  Take 25 mcg by mouth daily.     metoprolol 100 MG tablet  Commonly known as:  LOPRESSOR  Take 150 mg by mouth 2 (two) times daily.     OVER THE COUNTER MEDICATION  Take 1 capsule by mouth 2 (two) times daily. "Kidney Factor"     oxyCODONE-acetaminophen 5-325 MG tablet  Commonly known as:  PERCOCET/ROXICET  Take 1 tablet by mouth every 6 (six) hours as needed.     potassium chloride SA 20 MEQ tablet  Commonly known as:  K-DUR,KLOR-CON  Take 1 tablet (20 mEq total) by mouth 3 (three) times daily. Reported on 04/13/2015        Patients skin is clean, dry and intact, no evidence of skin break down. IV site discontinued and catheter remains intact. Site without signs and symptoms of complications. Dressing and pressure applied.  Patient escorted to car by NT in a wheelchair,  no distress noted upon discharge.  Wylene Men 04/14/2015 8:11 PM

## 2015-04-14 NOTE — Consult Note (Signed)
  Seen in my office this week. Neuro stable. Admitted for possible syncopal episode  Vs seizure. Ct head unchanged. Spoke with him . Patient to be dc on seizure medication. Aware of no driving till ok by neuro. To call my office prn

## 2015-04-14 NOTE — Consult Note (Signed)
Neurology Consultation Reason for Consult: unresponsive  Referring Physician: Dr Tomi Bamberger  CC: unresponsive  History is obtained from patient and chart  HPI: Javier Jenkins is a 65 y.o. male with multiple medical problems including a prior stroke with residual right sided HP, ESRD on HD and HTN who suffered a fall 1 month ago and had a right sided SDH evac in this hospital.  Since then he has had 4 epsiodes ofuncontrollable jerking of of the left arm.  However this was unreported to MDs prior to today.  Today he was sitting in his office chair at home when he suddenly appeared to become completely unresponsive.  He lost urine but no bowel.  No shaking or jerking observed and no tingue biting.  Pateint does not remember spell and only remembers waking up when paramedics came to his rescue.  He remained confused for serveral minutes and improved slowly.  Prior to this he had no hx of sz.  He was started on Keppra in ED and is currently at doses of 500BID.    ROS: A 14 point ROS was performed and is negative except as noted in the HPI.  Past Medical History  Diagnosis Date  . Hypertension   . Stroke [facility])     right side paralysis  . Hypothyroidism   . Diabetes mellitus without complication ([place])     not on medications  . Chronic kidney disease     stage 5  . Constipation     Family History  Problem Relation Age of Onset  . Hypertension Mother     Social History:  reports that he quit smoking about 30 years ago. His smoking use included Pipe. He has never used smokeless tobacco. He reports that he does not drink alcohol or use illicit drugs.  Exam: Current vital signs: BP 132/74 mmHg  Pulse 73  Temp(Src) 99.3 F (37.4 C) (Oral)  Resp 18  Ht 5\' 11"  (1.803 m)  Wt 87.59 kg (193 lb 1.6 oz)  BMI 26.94 kg/m2  SpO2 95% Vital signs in last 24 hours: Temp:  [98 F (36.7 C)-99.3 F (37.4 C)] 99.3 F (37.4 C) (03/17 0012) Pulse Rate:  [73-84] 73 (03/17 0012) Resp:  [12-23] 18  (03/17 0012) BP: (117-150)/(69-96) 132/74 mmHg (03/17 0012) SpO2:  [95 %-99 %] 95 % (03/17 0012) Weight:  [87.59 kg (193 lb 1.6 oz)-92.987 kg (205 lb)] 87.59 kg (193 lb 1.6 oz) (03/16 2142)   Physical Exam  Constitutional: Appears well-developed and well-nourished.  Psych: Affect appropriate to situation Eyes: No scleral injection HENT: No OP obstrucion Head: Normocephalic.  Cardiovascular: Normal rate and regular rhythm.  Respiratory: Effort normal and breath sounds normal to anterior ascultation GI: Soft.  No distension. There is no tenderness.  Skin: WDI  Neuro: Mental Status: Patient is awake, alert, oriented to person, place, month, year, and situation  Patient is able to give a clear and coherent history thoguh has minimal dysarthria from prior stroke No signs of aphasia or neglect Cranial Nerves: II: Visual Fields are full. Pupils are equal, round, and reactive to light. III,IV, VI: EOMI without ptosis or diploplia.  V: Facial sensation is symmetric to temperature VII: Facial movement has a right sided facial droop VIII: hearing is intact to voice X: Uvula elevates symmetrically XI: Shoulder shrug is symmetric. XII: tongue is midline without atrophy or fasciculations.  Motor: Tone is normal. Right sided hemiparesis affecting arm and leg Sensory: Sensation is symmetric to light touch and temperature in the  arms and legs Deep Tendon Reflexes: Right hypereflexia compared to left Plantars: Right upgoing toe Cerebellar: Unable to test    I have reviewed labs in epic and the results pertinent to this consultation are elevated Cr.  CT head with minimal acute/subacute blood over prior right SDH  I have reviewed the images obtained: CT head  Impression: Likely seizure.  Given prior seizures - focal affecting left hand he should be started on AEDs.  Continue keppra at current doses.  Absolutely no driving.  Patient was advised on this and he understood.  He will need to  follow up with outpatient neurology.   Medical issues per primary team.  No further w/u is needed.  Given the acute/subacute blood continue holding antiplatelets.   Recommendations: 1) as above

## 2015-04-14 NOTE — Progress Notes (Signed)
Pharmacist transitions of care counseling completed  Medication education provided on medication changes including stopping nifedipine, holding ASA 325 mg until 04/21/15, and Keppra.  Pt was education on proper adherence and potential SE.  Pt and family demonstrated understanding.  Bennye Alm, PharmD Pharmacy Resident [phone]

## 2015-04-14 NOTE — Care Management Note (Signed)
Case Management Note  Patient Details  Name: Javier Jenkins MRN: [id number] Date of Birth: 09/01/1950  Subjective/Objective:                 Admitted with syncope.   Action/Plan: Discharge to home today. No further needs identified per CM.  Expected Discharge Date:     04/14/2015             Expected Discharge Plan:  Home/Self Care  In-House Referral:     Discharge planning Services  CM Consult  Post Acute Care Choice:    Choice offered to:     DME Arranged:    DME Agency:     HH Arranged:    HH Agency:     Status of Service:  Completed, signed off  Medicare Important Message Given:    Date Medicare IM Given:    Medicare IM give by:    Date Additional Medicare IM Given:    Additional Medicare Important Message give by:     If discussed at [place] of Stay Meetings, dates discussed:    Additional Comments:  [name], [place] [phone] 04/14/2015, 10:01 AM

## 2015-04-15 LAB — TYPE AND SCREEN
ABO/RH(D): O POS
ANTIBODY SCREEN: NEGATIVE
UNIT DIVISION: 0

## 2015-04-25 ENCOUNTER — Encounter: Payer: Self-pay | Admitting: Vascular Surgery

## 2015-05-03 ENCOUNTER — Encounter (HOSPITAL_COMMUNITY): Payer: PRIVATE HEALTH INSURANCE

## 2015-05-03 ENCOUNTER — Ambulatory Visit: Payer: PRIVATE HEALTH INSURANCE | Admitting: Vascular Surgery

## 2015-05-31 ENCOUNTER — Encounter (HOSPITAL_COMMUNITY): Payer: Self-pay | Admitting: Emergency Medicine

## 2015-05-31 ENCOUNTER — Emergency Department (HOSPITAL_COMMUNITY): Payer: PRIVATE HEALTH INSURANCE

## 2015-05-31 ENCOUNTER — Inpatient Hospital Stay (HOSPITAL_COMMUNITY)
Admission: EM | Admit: 2015-05-31 | Discharge: 2015-06-03 | DRG: 640 | Disposition: A | Discharge status: Home / Self Care | Payer: PRIVATE HEALTH INSURANCE | Attending: Internal Medicine | Admitting: Internal Medicine

## 2015-05-31 DIAGNOSIS — N186 End stage renal disease: Secondary | ICD-10-CM

## 2015-05-31 DIAGNOSIS — I10 Essential (primary) hypertension: Secondary | ICD-10-CM | POA: Diagnosis present

## 2015-05-31 DIAGNOSIS — Z7982 Long term (current) use of aspirin: Secondary | ICD-10-CM

## 2015-05-31 DIAGNOSIS — Z87891 Personal history of nicotine dependence: Secondary | ICD-10-CM

## 2015-05-31 DIAGNOSIS — E039 Hypothyroidism, unspecified: Secondary | ICD-10-CM | POA: Diagnosis present

## 2015-05-31 DIAGNOSIS — Z79899 Other long term (current) drug therapy: Secondary | ICD-10-CM | POA: Diagnosis not present

## 2015-05-31 DIAGNOSIS — E1122 Type 2 diabetes mellitus with diabetic chronic kidney disease: Secondary | ICD-10-CM | POA: Diagnosis present

## 2015-05-31 DIAGNOSIS — Z298 Encounter for other specified prophylactic measures: Secondary | ICD-10-CM

## 2015-05-31 DIAGNOSIS — Z2989 Encounter for other specified prophylactic measures: Secondary | ICD-10-CM

## 2015-05-31 DIAGNOSIS — I69351 Hemiplegia and hemiparesis following cerebral infarction affecting right dominant side: Secondary | ICD-10-CM | POA: Diagnosis not present

## 2015-05-31 DIAGNOSIS — N189 Chronic kidney disease, unspecified: Secondary | ICD-10-CM | POA: Diagnosis not present

## 2015-05-31 DIAGNOSIS — R140 Abdominal distension (gaseous): Secondary | ICD-10-CM | POA: Insufficient documentation

## 2015-05-31 DIAGNOSIS — D631 Anemia in chronic kidney disease: Secondary | ICD-10-CM | POA: Diagnosis not present

## 2015-05-31 DIAGNOSIS — E8770 Fluid overload, unspecified: Secondary | ICD-10-CM | POA: Diagnosis present

## 2015-05-31 DIAGNOSIS — Z992 Dependence on renal dialysis: Secondary | ICD-10-CM

## 2015-05-31 DIAGNOSIS — R569 Unspecified convulsions: Secondary | ICD-10-CM | POA: Diagnosis present

## 2015-05-31 DIAGNOSIS — I120 Hypertensive chronic kidney disease with stage 5 chronic kidney disease or end stage renal disease: Secondary | ICD-10-CM | POA: Diagnosis present

## 2015-05-31 DIAGNOSIS — R112 Nausea with vomiting, unspecified: Secondary | ICD-10-CM | POA: Diagnosis not present

## 2015-05-31 DIAGNOSIS — R5383 Other fatigue: Secondary | ICD-10-CM

## 2015-05-31 LAB — COMPREHENSIVE METABOLIC PANEL
ALBUMIN: 3.5 g/dL (ref 3.5–5.0)
ALT: 16 U/L — ABNORMAL LOW (ref 17–63)
ANION GAP: 14 (ref 5–15)
AST: 21 U/L (ref 15–41)
Alkaline Phosphatase: 40 U/L (ref 38–126)
BUN: 70 mg/dL — ABNORMAL HIGH (ref 6–20)
CHLORIDE: 103 mmol/L (ref 101–111)
CO2: 20 mmol/L — AB (ref 22–32)
Calcium: 8.9 mg/dL (ref 8.9–10.3)
Creatinine, Ser: 9.18 mg/dL — ABNORMAL HIGH (ref 0.61–1.24)
GFR calc non Af Amer: 5 mL/min — ABNORMAL LOW (ref >60)
GFR, EST AFRICAN AMERICAN: 6 mL/min — AB (ref >60)
GLUCOSE: 138 mg/dL — AB (ref 65–99)
Potassium: 3.5 mmol/L (ref 3.5–5.1)
SODIUM: 137 mmol/L (ref 135–145)
Total Bilirubin: 0.5 mg/dL (ref 0.3–1.2)
Total Protein: 6.9 g/dL (ref 6.5–8.1)

## 2015-05-31 LAB — CBC
HEMATOCRIT: 24.3 % — AB (ref 39.0–52.0)
HEMOGLOBIN: 8 g/dL — AB (ref 13.0–17.0)
MCH: 28.1 pg (ref 26.0–34.0)
MCHC: 32.9 g/dL (ref 30.0–36.0)
MCV: 85.3 fL (ref 78.0–100.0)
Platelets: 292 10*3/uL (ref 150–400)
RBC: 2.85 MIL/uL — AB (ref 4.22–5.81)
RDW: 14.7 % (ref 11.5–15.5)
WBC: 4.8 10*3/uL (ref 4.0–10.5)

## 2015-05-31 LAB — URINE MICROSCOPIC-ADD ON

## 2015-05-31 LAB — URINALYSIS, ROUTINE W REFLEX MICROSCOPIC
Bilirubin Urine: NEGATIVE
GLUCOSE, UA: NEGATIVE mg/dL
Ketones, ur: NEGATIVE mg/dL
LEUKOCYTES UA: NEGATIVE
Nitrite: NEGATIVE
PROTEIN: 100 mg/dL — AB
Specific Gravity, Urine: 1.012 (ref 1.005–1.030)
pH: 5.5 (ref 5.0–8.0)

## 2015-05-31 LAB — LIPASE, BLOOD: LIPASE: 27 U/L (ref 11–51)

## 2015-05-31 MED ORDER — ASPIRIN 325 MG PO TABS
325.0000 mg | ORAL_TABLET | Freq: Every day | ORAL | Status: DC
  Administered 2015-06-01 – 2015-06-03 (×3): 325 mg via ORAL
  Filled 2015-05-31 (×3): qty 1

## 2015-05-31 MED ORDER — ONDANSETRON HCL 4 MG/2ML IJ SOLN
4.0000 mg | Freq: Four times a day (QID) | INTRAMUSCULAR | Status: DC | PRN
  Administered 2015-05-31: 4 mg via INTRAVENOUS
  Filled 2015-05-31 (×2): qty 2

## 2015-05-31 MED ORDER — ONDANSETRON HCL 4 MG PO TABS: 4.0000 mg | ORAL_TABLET | Freq: Four times a day (QID) | ORAL | Status: DC | PRN

## 2015-05-31 MED ORDER — LEVOTHYROXINE SODIUM 25 MCG PO TABS
25.0000 ug | ORAL_TABLET | Freq: Every day | ORAL | Status: DC
  Administered 2015-06-02 – 2015-06-03 (×2): 25 ug via ORAL
  Filled 2015-05-31 (×2): qty 1

## 2015-05-31 MED ORDER — ACETAMINOPHEN 650 MG RE SUPP: 650.0000 mg | Freq: Four times a day (QID) | RECTAL | Status: DC | PRN

## 2015-05-31 MED ORDER — ISOSORB DINITRATE-HYDRALAZINE 20-37.5 MG PO TABS
0.5000 | ORAL_TABLET | Freq: Two times a day (BID) | ORAL | Status: DC
  Administered 2015-06-01 – 2015-06-03 (×6): 0.5 via ORAL
  Filled 2015-05-31 (×6): qty 1

## 2015-05-31 MED ORDER — ACETAMINOPHEN 325 MG PO TABS: 650.0000 mg | ORAL_TABLET | Freq: Four times a day (QID) | ORAL | Status: DC | PRN

## 2015-05-31 MED ORDER — LEVETIRACETAM 500 MG PO TABS
500.0000 mg | ORAL_TABLET | Freq: Two times a day (BID) | ORAL | Status: DC
  Administered 2015-06-01 – 2015-06-03 (×6): 500 mg via ORAL
  Filled 2015-05-31 (×7): qty 1

## 2015-05-31 MED ORDER — DOCUSATE SODIUM 100 MG PO CAPS
100.0000 mg | ORAL_CAPSULE | Freq: Two times a day (BID) | ORAL | Status: DC
  Administered 2015-06-02 – 2015-06-03 (×3): 100 mg via ORAL
  Filled 2015-05-31 (×4): qty 1

## 2015-05-31 MED ORDER — OXYCODONE-ACETAMINOPHEN 5-325 MG PO TABS
1.0000 | ORAL_TABLET | Freq: Four times a day (QID) | ORAL | Status: DC | PRN
  Administered 2015-06-01 – 2015-06-03 (×4): 1 via ORAL
  Filled 2015-05-31 (×4): qty 1

## 2015-05-31 MED ORDER — ALBUTEROL SULFATE (2.5 MG/3ML) 0.083% IN NEBU: 2.5000 mg | INHALATION_SOLUTION | RESPIRATORY_TRACT | Status: DC | PRN

## 2015-05-31 MED ORDER — DOXAZOSIN MESYLATE 8 MG PO TABS
8.0000 mg | ORAL_TABLET | Freq: Every day | ORAL | Status: DC
  Administered 2015-06-01 – 2015-06-02 (×3): 8 mg via ORAL
  Filled 2015-05-31: qty 4
  Filled 2015-05-31: qty 1
  Filled 2015-05-31: qty 4
  Filled 2015-05-31 (×2): qty 1
  Filled 2015-05-31: qty 4
  Filled 2015-05-31: qty 1
  Filled 2015-05-31: qty 4

## 2015-05-31 MED ORDER — CLONIDINE HCL 0.2 MG PO TABS
0.2000 mg | ORAL_TABLET | Freq: Two times a day (BID) | ORAL | Status: DC
  Administered 2015-06-01 – 2015-06-03 (×6): 0.2 mg via ORAL
  Filled 2015-05-31 (×6): qty 1

## 2015-05-31 MED ORDER — METOPROLOL TARTRATE 50 MG PO TABS
150.0000 mg | ORAL_TABLET | Freq: Two times a day (BID) | ORAL | Status: DC
  Administered 2015-06-01 – 2015-06-03 (×6): 150 mg via ORAL
  Filled 2015-05-31 (×6): qty 1

## 2015-05-31 MED ORDER — CALCIUM ACETATE (PHOS BINDER) 667 MG PO CAPS
667.0000 mg | ORAL_CAPSULE | Freq: Two times a day (BID) | ORAL | Status: DC
  Administered 2015-06-01: 667 mg via ORAL
  Filled 2015-05-31 (×2): qty 1

## 2015-05-31 NOTE — H&P (Signed)
History and Physical  Javier Jenkins [id number] DOB: 1950-12-18 DOA: 05/31/2015   PCP: Maximino Greenland, MD  Outpatient Specialists: Dr. Florene Glen of [facility]  Patient coming from: Home & is able to ambulate.  Chief Complaint: nausea   HPI: Javier Jenkins is a 65 y.o. male with medical history significant of HTN and ESRD secondary to this who is being admitted to the hospital for initiation of dialysis due to subacute development of nausea, decreased urine output and peripheral edema.  The patient has a prior left upper extremity dialysis graft, he has never had HD before. For the last 7 days he has experienced nightly nausea with vomiting, as well as reduced urine output and lower extremity swelling. Associated abdominal distension. No change in bowel habits. No therapies prior to arrival. He called [facility] this AM and was told to present to the hospital for evaluation.  ED Course: Labs were obtained, and Dr. Florene Glen was consulted.  Review of Systems: Patient seen and examined in the ER .  No chest pain, fever, chills, abdominal pain.  Complete 12 pt review of systems are otherwise negative.  Past Medical History  Diagnosis Date  . Hypertension   . Stroke [facility])     right side paralysis  . Hypothyroidism   . Diabetes mellitus without complication ([place])     not on medications  . Chronic kidney disease     stage 5  . Constipation    Past Surgical History  Procedure Laterality Date  . Hemorrhoid surgery    . Av fistula placement Left 10/18/2014    Procedure: ARTERIOVENOUS (AV) FISTULA CREATION;  Surgeon: Angelia Mould, MD;  Location: [facility];  Service: Vascular;  Laterality: Left;  . Burr hole Right 03/17/2015    Procedure: BURR HOLES FOR RIGHT SUBDURAL HEMATOMA;  Surgeon: Leeroy Cha, MD;  Location: [facility];  Service: Neurosurgery;  Laterality: Right;    Social History:  reports that he quit smoking about 30 years ago. His smoking use included  Pipe. He has never used smokeless tobacco. He reports that he does not drink alcohol or use illicit drugs.   No Known Allergies  Family History  Problem Relation Age of Onset  . Hypertension Mother      Prior to Admission medications   Medication Sig Start Date End Date Taking? Authorizing Provider  aspirin 325 MG tablet Take 1 tablet (325 mg total) by mouth daily. 04/21/15  Yes Reyne Dumas, MD  calcium acetate (PHOSLO) 667 MG capsule Take 1 capsule by mouth 2 (two) times daily.  04/06/15  Yes Historical Provider, MD  cloNIDine (CATAPRES) 0.2 MG tablet Take 0.4 mg by mouth 2 (two) times daily.    Yes Historical Provider, MD  docusate sodium (COLACE) 100 MG capsule Take 1 capsule (100 mg total) by mouth every 12 (twelve) hours. Patient taking differently: Take 100 mg by mouth every 12 (twelve) hours.  04/03/14  Yes Junius Creamer, NP  doxazosin (CARDURA) 8 MG tablet Take 8 mg by mouth at bedtime.  02/22/15  Yes Historical Provider, MD  furosemide (LASIX) 80 MG tablet Take 80 mg by mouth 2 (two) times daily.    Yes Historical Provider, MD  isosorbide-hydrALAZINE (BIDIL) 20-37.5 MG tablet Take 0.5 tablets by mouth 2 (two) times daily.   Yes Historical Provider, MD  levETIRAcetam (KEPPRA) 500 MG tablet Take 1 tablet (500 mg total) by mouth 2 (two) times daily. 04/14/15  Yes Reyne Dumas, MD  levothyroxine (SYNTHROID, LEVOTHROID) 25 MCG  tablet Take 25 mcg by mouth daily.  01/29/14  Yes Historical Provider, MD  metoprolol (LOPRESSOR) 100 MG tablet Take 150 mg by mouth 2 (two) times daily.    Yes Historical Provider, MD  OVER THE COUNTER MEDICATION Take 1 capsule by mouth 2 (two) times daily. "Kidney Factor"   Yes Historical Provider, MD  oxyCODONE-acetaminophen (PERCOCET/ROXICET) 5-325 MG per tablet Take 1 tablet by mouth every 6 (six) hours as needed. 10/18/14  Yes Ulyses Amor, PA-C  potassium chloride SA (K-DUR,KLOR-CON) 20 MEQ tablet Take 1 tablet (20 mEq total) by mouth 3 (three) times daily. Reported  on 04/13/2015 04/14/15  Yes Reyne Dumas, MD    Physical Exam: BP 187/106 mmHg  Pulse 56  Temp(Src) 97.7 F (36.5 C) (Oral)  Resp 18  Ht 5\' 11"  (1.803 m)  SpO2 100%  General:  Alert, oriented, calm, in no acute distress  Eyes: pupils round and reactive to light and accomodation, clear sclerea Neck: supple, no masses, trachea mildline  Cardiovascular: RRR, no murmurs or rubs, he has 1+ pitting edema in his bilateral ankles Respiratory: clear to auscultation bilaterally, no wheezes, no crackles  Abdomen: soft, nontender, distended, normal bowel tones heard  Skin: dry, no rashes  Musculoskeletal: no joint effusions, normal range of motion. Has LUE graft with palpable pulse. Psychiatric: appropriate affect, normal speech  Neurologic: extraocular muscles intact, clear speech, moving all extremities but he is post-stroke on right side with contracture of right arm, right facial droop           Labs on Admission:  Basic Metabolic Panel:  Recent Labs Lab 05/31/15 1159  NA 137  K 3.5  CL 103  CO2 20*  GLUCOSE 138*  BUN 70*  CREATININE 9.18*  CALCIUM 8.9   Liver Function Tests:  Recent Labs Lab 05/31/15 1159  AST 21  ALT 16*  ALKPHOS 40  BILITOT 0.5  PROT 6.9  ALBUMIN 3.5    Recent Labs Lab 05/31/15 1159  LIPASE 27   No results for input(s): AMMONIA in the last 168 hours. CBC:  Recent Labs Lab 05/31/15 1159  WBC 4.8  HGB 8.0*  HCT 24.3*  MCV 85.3  PLT 292   Cardiac Enzymes: No results for input(s): CKTOTAL, CKMB, CKMBINDEX, TROPONINI in the last 168 hours.  BNP (last 3 results)  Recent Labs  06/29/14 1810  BNP 74.9    ProBNP (last 3 results) No results for input(s): PROBNP in the last 8760 hours.  CBG: No results for input(s): GLUCAP in the last 168 hours.  Radiological Exams on Admission: Dg Chest Port 1 View  05/31/2015  CLINICAL DATA:  Abdominal pain.  Nausea and vomiting. EXAM: PORTABLE CHEST 1 VIEW COMPARISON:  The 07/19/2014;  01/15/2013 FINDINGS: Grossly unchanged enlarged cardiac silhouette and mediastinal contours given slightly reduced lung volumes. Atherosclerotic plaque within a mildly tortuous thoracic aorta. Minimal bibasilar heterogeneous opacities, favored to represent atelectasis. Mild pulmonary venous congestion left frank evidence of edema. No pleural effusion or pneumothorax. No acute osseus abnormalities. IMPRESSION: Cardiomegaly and bibasilar atelectasis without acute cardiopulmonary disease on this AP portable examination. Further evaluation with a PA and lateral chest radiograph may be obtained as clinically indicated. Electronically Signed   By: Sandi Mariscal M.D.   On: 05/31/2015 16:36    Assessment/Plan Present on Admission:  . ESRD (end stage renal disease) ([place]) - pt presents with fluid overload, nausea, abdominal bloating. Likely would benefit from initiation of HD in the hospital. Admitted under inpatient status, being seen  by Dr. Florene Glen in the ER. Anticipate start of HD in AM via his left arm graft. . Essential hypertension - cont home medications Bidil, Cardura. BP currently uncontrolled. HD will help this. . Anemia in chronic kidney disease - watch for s/s of bleeding, follow CBC daily.  Seizure Ppx - cont Keppra Hypothyroidism - cont home synthroid   DVT prophylaxis: SCDs   Code Status: Full   Family Communication: Discussed with pt and wife, who was at the bedside.   Disposition Plan: Likely to home at discharge.   Consults called: Nephrology was called by ER MD.   Admission status: Inpatient   Time spent: 65 minutes   Javier Menz Marry Guan MD [facility] Pager [phone]  If 7PM-7AM, please contact night-coverage [email or web address] Password [facility]  05/31/2015, 5:44 PM

## 2015-05-31 NOTE — ED Notes (Signed)
Attempted report 

## 2015-05-31 NOTE — Progress Notes (Signed)
Patient arrived on unit via stretcher from ED.  Wife at bedside.

## 2015-05-31 NOTE — ED Notes (Signed)
Pt states "I've been throwing up the last couple days." Pt seeing a kidney doctor, about to start dialysis. Pt Has graft on L arm ready to use. Pt c/o adbominal swelling and pain for a few days, pain comes and goes. Denies pain at this time. Hx of stroke, pt uses a cane and has contraction to R arm. Pt denies n/v at this time. Symptom free. Nephrologist recommended patient come to the ER for evaluation. Pt in NAD, resp e/u.

## 2015-05-31 NOTE — ED Notes (Signed)
Pt alert no distress or discomfort.  Family at the bedside

## 2015-05-31 NOTE — ED Notes (Signed)
Nephrology, MD at bedside.

## 2015-05-31 NOTE — Consult Note (Signed)
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 He reports a history of AM nausea a few weeks ago at his last OV and now has been having persistent evening nausea and vomiting for the past week.    He also c/o decreased urine output, abdominal distension, puffy eyelids and peripheral edema. He is s/p usable left upper extremity AVF.   He has never had HD before.   No change in bowel habits.  He presented to the ED and was found to have a BUN of 70 and creat of 9.33m/dl.    Past Medical History  Diagnosis Date  . Hypertension   . Stroke ([facility]     right side paralysis  . Hypothyroidism   . [place]     not on medications  . Chronic kidney disease     stage 5  . Constipation    Past Surgical History  Procedure Laterality Date  . Hemorrhoid surgery    . Av fistula placement Left 10/18/2014    Procedure: ARTERIOVENOUS (AV) FISTULA CREATION;  Surgeon: CAngelia Mould MD;  [facility] [facility]  Service: Vascular;  Laterality: Left;  . Burr hole Right 03/17/2015    Procedure: BURR HOLES FOR RIGHT SUBDURAL HEMATOMA;  Surgeon: ELeeroy Cha MD;  [facility];  Service: Neurosurgery;  Laterality: Right;   Social History:  reports that he quit smoking about 30 years ago. His smoking use included Pipe. He has never used smokeless tobacco. He reports that he does not drink alcohol or use illicit drugs. Allergies: No Known Allergies Family History  Problem Relation Age of Onset  . Hypertension Mother     Medications: I have reviewed the patient's current medications.  ROS: As per HPI  Blood pressure 187/106, pulse 56, temperature 97.7 F (36.5 C), temperature source Oral, resp. rate 18, height 5' 11" (1.803 m), SpO2 100 %.  General appearance: alert and cooperative Head: Normocephalic, without obvious abnormality, atraumatic Eyes: puffy  eyelids. Throat: lips, mucosa, and tongue normal; teeth and gums normal Resp: diminished breath sounds bilaterally Chest wall: no tenderness Cardio: regular rate and rhythm, S1, S2 normal, no murmur, click, rub or gallop GI: sl distension Extremities: edema 1-2+ Skin: Skin color, texture, turgor normal. No rashes or lesions Neurologic: Motor: right arm flexion contraction to 90 degrees, brace on RLE Results for orders placed or performed during the hospital encounter of 05/31/15 (from the past 48 hour(s))  Lipase, blood     Status: None   Collection Time: 05/31/15 11:59 AM  Result Value Ref Range   Lipase 27 11 - 51 U/L  Comprehensive metabolic panel     Status: Abnormal   Collection Time: 05/31/15 11:59 AM  Result Value Ref Range   Sodium 137 135 - 145 mmol/L   Potassium 3.5 3.5 - 5.1 mmol/L   Chloride 103 101 - 111 mmol/L   CO2 20 (L) 22 - 32 mmol/L   Glucose, Bld 138 (H) 65 - 99 mg/dL   BUN 70 (H) 6 - 20 mg/dL   Creatinine, Ser 9.18 (H) 0.61 - 1.24 mg/dL   Calcium 8.9 8.9 - 10.3 mg/dL   Total Protein 6.9 6.5 - 8.1 g/dL   Albumin 3.5 3.5 - 5.0 g/dL   AST 21 15 - 41 U/L   ALT 16 (L) 17 - 63 U/L   Alkaline Phosphatase 40 38 - 126  U/L   Total Bilirubin 0.5 0.3 - 1.2 mg/dL   GFR calc non Af Amer 5 (L) >60 mL/min   GFR calc Af Amer 6 (L) >60 mL/min    Comment: (NOTE) The eGFR has been calculated using the CKD EPI equation. This calculation has not been validated in all clinical situations. eGFR's persistently <60 mL/min signify possible Chronic Kidney Disease.    Anion gap 14 5 - 15  CBC     Status: Abnormal   Collection Time: 05/31/15 11:59 AM  Result Value Ref Range   WBC 4.8 4.0 - 10.5 K/uL   RBC 2.85 (L) 4.22 - 5.81 MIL/uL   Hemoglobin 8.0 (L) 13.0 - 17.0 g/dL   HCT 24.3 (L) 39.0 - 52.0 %   MCV 85.3 78.0 - 100.0 fL   MCH 28.1 26.0 - 34.0 pg   MCHC 32.9 30.0 - 36.0 g/dL   RDW 14.7 11.5 - 15.5 %   Platelets 292 150 - 400 K/uL   Dg Chest Port 1 View  05/31/2015   CLINICAL DATA:  Abdominal pain.  Nausea and vomiting. EXAM: PORTABLE CHEST 1 VIEW COMPARISON:  The 07/19/2014; 01/15/2013 FINDINGS: Grossly unchanged enlarged cardiac silhouette and mediastinal contours given slightly reduced lung volumes. Atherosclerotic plaque within a mildly tortuous thoracic aorta. Minimal bibasilar heterogeneous opacities, favored to represent atelectasis. Mild pulmonary venous congestion left frank evidence of edema. No pleural effusion or pneumothorax. No acute osseus abnormalities. IMPRESSION: Cardiomegaly and bibasilar atelectasis without acute cardiopulmonary disease on this AP portable examination. Further evaluation with a PA and lateral chest radiograph may be obtained as clinically indicated. Electronically Signed   By: Sandi Mariscal M.D.   On: 05/31/2015 16:36    Assessment:  1 ESRD due to hypertension with symptomatic uremia 2 Anemia due to #1 3 Hx of CVA 4 S/P LUE AVF 5 Diabetes Mellitus 6 Hypertension, volume sensitive Plan: 1 Initiate HD today & do tommorow 2 Arrange OP HD in AM  Latana Colin C 05/31/2015, 6:02 PM

## 2015-05-31 NOTE — ED Provider Notes (Signed)
CSN: [id number]     Arrival date & time 05/31/15  1133 History   First MD Initiated Contact with Patient 05/31/15 1507     Chief Complaint  Patient presents with  . Abdominal Pain  . Nausea  . Emesis     (Consider location/radiation/quality/duration/timing/severity/associated sxs/prior Treatment) HPI Patient reports he has kidney failure. He reports that he has a fistula but has not yet started dialysis. He reports that he has been seeing Dr. Florene Glen [facility] [place] [facility]. Per the patient's wife, the plan was to start dialysis when he started to feel more symptomatic. This week he has had increasing weakness and fatigue. He reports he has developed abdominal bloating and distention. No abdominal pain. He reports yesterday evening he vomited 3 times. Patient denies chest pain. He has not noted shortness of breath but his wife reports he seemed to be breathing faster lately. They note a mild increase in swelling of his lower legs. He does wear a brace chronically on the right lower extremity for old stroke. Past Medical History  Diagnosis Date  . Hypertension   . Stroke [facility])     right side paralysis  . Hypothyroidism   . Diabetes mellitus without complication ([place])     not on medications  . Chronic kidney disease     stage 5  . Constipation    Past Surgical History  Procedure Laterality Date  . Hemorrhoid surgery    . Av fistula placement Left 10/18/2014    Procedure: ARTERIOVENOUS (AV) FISTULA CREATION;  Surgeon: Angelia Mould, MD;  Location: [place];  Service: Vascular;  Laterality: Left;  . Burr hole Right 03/17/2015    Procedure: BURR HOLES FOR RIGHT SUBDURAL HEMATOMA;  Surgeon: Leeroy Cha, MD;  Location: [place] [facility];  Service: Neurosurgery;  Laterality: Right;   Family History  Problem Relation Age of Onset  . Hypertension Mother    Social History  Substance Use Topics  . Smoking status: Former Smoker    Types: Pipe    Quit date: 09/22/1984  . Smokeless tobacco:  Never Used  . Alcohol Use: No    Review of Systems 10 Systems reviewed and are negative for acute change except as noted in the HPI.    Allergies  Review of patient's allergies indicates no known allergies.  Home Medications   Prior to Admission medications   Medication Sig Start Date End Date Taking? Authorizing Provider  aspirin 325 MG tablet Take 1 tablet (325 mg total) by mouth daily. 04/21/15  Yes Reyne Dumas, MD  calcium acetate (PHOSLO) 667 MG capsule Take 1 capsule by mouth 2 (two) times daily.  04/06/15  Yes Historical Provider, MD  cloNIDine (CATAPRES) 0.2 MG tablet Take 0.4 mg by mouth 2 (two) times daily.    Yes Historical Provider, MD  docusate sodium (COLACE) 100 MG capsule Take 1 capsule (100 mg total) by mouth every 12 (twelve) hours. Patient taking differently: Take 100 mg by mouth every 12 (twelve) hours.  04/03/14  Yes Junius Creamer, NP  doxazosin (CARDURA) 8 MG tablet Take 8 mg by mouth at bedtime.  02/22/15  Yes Historical Provider, MD  furosemide (LASIX) 80 MG tablet Take 80 mg by mouth 2 (two) times daily.    Yes Historical Provider, MD  isosorbide-hydrALAZINE (BIDIL) 20-37.5 MG tablet Take 0.5 tablets by mouth 2 (two) times daily.   Yes Historical Provider, MD  levETIRAcetam (KEPPRA) 500 MG tablet Take 1 tablet (500 mg total) by mouth 2 (two) times daily. 04/14/15  Yes Reyne Dumas, MD  levothyroxine (SYNTHROID, LEVOTHROID) 25 MCG tablet Take 25 mcg by mouth daily.  01/29/14  Yes Historical Provider, MD  metoprolol (LOPRESSOR) 100 MG tablet Take 150 mg by mouth 2 (two) times daily.    Yes Historical Provider, MD  OVER THE COUNTER MEDICATION Take 1 capsule by mouth 2 (two) times daily. "Kidney Factor"   Yes Historical Provider, MD  oxyCODONE-acetaminophen (PERCOCET/ROXICET) 5-325 MG per tablet Take 1 tablet by mouth every 6 (six) hours as needed. 10/18/14  Yes Ulyses Amor, PA-C  potassium chloride SA (K-DUR,KLOR-CON) 20 MEQ tablet Take 1 tablet (20 mEq total) by mouth 3  (three) times daily. Reported on 04/13/2015 04/14/15  Yes Reyne Dumas, MD   BP 181/107 mmHg  Pulse 61  Temp(Src) 97.7 F (36.5 C) (Oral)  Resp 19  Ht 5\' 11"  (1.803 m)  SpO2 100% Physical Exam  Constitutional: He is oriented to person, place, and time. He appears well-developed and well-nourished.  HENT:  Head: Normocephalic and atraumatic.  Eyes: EOM are normal. Pupils are equal, round, and reactive to light.  Neck: Neck supple.  Cardiovascular: Normal rate, regular rhythm, normal heart sounds and intact distal pulses.   Pulmonary/Chest: Effort normal and breath sounds normal.  Abdominal: Soft. Bowel sounds are normal. He exhibits distension. There is no tenderness.  Musculoskeletal: Normal range of motion. He exhibits edema.  Patient has 1+ pitting edema left lower extremity. Right lower extremity is in a brace and has atrophy due to old stroke. Trace pitting edema.  Neurological: He is alert and oriented to person, place, and time. He has normal strength. Coordination normal. GCS eye subscore is 4. GCS verbal subscore is 5. GCS motor subscore is 6.  Skin: Skin is warm, dry and intact.  Psychiatric: He has a normal mood and affect.    ED Course  Procedures (including critical care time) Labs Review Labs Reviewed  COMPREHENSIVE METABOLIC PANEL - Abnormal; Notable for the following:    CO2 20 (*)    Glucose, Bld 138 (*)    BUN 70 (*)    Creatinine, Ser 9.18 (*)    ALT 16 (*)    GFR calc non Af Amer 5 (*)    GFR calc Af Amer 6 (*)    All other components within normal limits  CBC - Abnormal; Notable for the following:    RBC 2.85 (*)    Hemoglobin 8.0 (*)    HCT 24.3 (*)    All other components within normal limits  LIPASE, BLOOD  URINALYSIS, ROUTINE W REFLEX MICROSCOPIC (NOT AT [facility])    Imaging Review Dg Chest Port 1 View  05/31/2015  CLINICAL DATA:  Abdominal pain.  Nausea and vomiting. EXAM: PORTABLE CHEST 1 VIEW COMPARISON:  The 07/19/2014; 01/15/2013 FINDINGS:  Grossly unchanged enlarged cardiac silhouette and mediastinal contours given slightly reduced lung volumes. Atherosclerotic plaque within a mildly tortuous thoracic aorta. Minimal bibasilar heterogeneous opacities, favored to represent atelectasis. Mild pulmonary venous congestion left frank evidence of edema. No pleural effusion or pneumothorax. No acute osseus abnormalities. IMPRESSION: Cardiomegaly and bibasilar atelectasis without acute cardiopulmonary disease on this AP portable examination. Further evaluation with a PA and lateral chest radiograph may be obtained as clinically indicated. Electronically Signed   By: Sandi Mariscal M.D.   On: 05/31/2015 16:36   I have personally reviewed and evaluated these images and lab results as part of my medical decision-making.   EKG Interpretation None     Consult: Reviewed with Dr.  Florene [facility] of [place] [facility]. Will admit to hospitalist and they will dialyze inpatient. Consult: [facility] hospitalist for admission Dr. Renaee Munda MDM   Final diagnoses:  ESRD needing dialysis ([place])  Other fatigue  Abdominal bloating   Patient has dialysis fistula established. He has not yet initiated dialysis. Over the past day the patient has become symptomatic with increased abdominal distention, fatigue and nausea. Patient will be admitted to the hospitalist for consultation with Dr. Florene Glen to initiate dialysis with associated symptoms of renal failure.    Charlesetta Shanks, MD 05/31/15 [phone]

## 2015-06-01 LAB — BASIC METABOLIC PANEL
Anion gap: 16 — ABNORMAL HIGH (ref 5–15)
BUN: 39 mg/dL — ABNORMAL HIGH (ref 6–20)
CHLORIDE: 101 mmol/L (ref 101–111)
CO2: 23 mmol/L (ref 22–32)
Calcium: 8.9 mg/dL (ref 8.9–10.3)
Creatinine, Ser: 6.18 mg/dL — ABNORMAL HIGH (ref 0.61–1.24)
GFR calc non Af Amer: 8 mL/min — ABNORMAL LOW (ref >60)
GFR, EST AFRICAN AMERICAN: 10 mL/min — AB (ref >60)
Glucose, Bld: 85 mg/dL (ref 65–99)
Potassium: 3.2 mmol/L — ABNORMAL LOW (ref 3.5–5.1)
SODIUM: 140 mmol/L (ref 135–145)

## 2015-06-01 LAB — PHOSPHORUS: Phosphorus: 3.3 mg/dL (ref 2.5–4.6)

## 2015-06-01 LAB — CBC
HCT: 25.7 % — ABNORMAL LOW (ref 39.0–52.0)
Hemoglobin: 8.3 g/dL — ABNORMAL LOW (ref 13.0–17.0)
MCH: 28.4 pg (ref 26.0–34.0)
MCHC: 32.3 g/dL (ref 30.0–36.0)
MCV: 88 fL (ref 78.0–100.0)
Platelets: 268 10*3/uL (ref 150–400)
RBC: 2.92 MIL/uL — AB (ref 4.22–5.81)
RDW: 14.9 % (ref 11.5–15.5)
WBC: 4.8 10*3/uL (ref 4.0–10.5)

## 2015-06-01 MED ORDER — HEPARIN SODIUM (PORCINE) 1000 UNIT/ML DIALYSIS: 1000.0000 [IU] | INTRAMUSCULAR | Status: DC | PRN

## 2015-06-01 MED ORDER — HYDRALAZINE HCL 20 MG/ML IJ SOLN
5.0000 mg | Freq: Once | INTRAMUSCULAR | Status: AC
  Administered 2015-06-01: 5 mg via INTRAVENOUS
  Filled 2015-06-01: qty 1

## 2015-06-01 MED ORDER — RENA-VITE PO TABS
1.0000 | ORAL_TABLET | Freq: Every day | ORAL | Status: DC
  Administered 2015-06-01 – 2015-06-02 (×2): 1 via ORAL
  Filled 2015-06-01 (×2): qty 1

## 2015-06-01 MED ORDER — SODIUM CHLORIDE 0.9 % IV SOLN: 100.0000 mL | INTRAVENOUS | Status: DC | PRN

## 2015-06-01 MED ORDER — PENTAFLUOROPROP-TETRAFLUOROETH EX AERO: 1.0000 "application " | INHALATION_SPRAY | CUTANEOUS | Status: DC | PRN

## 2015-06-01 MED ORDER — HEPARIN SODIUM (PORCINE) 1000 UNIT/ML DIALYSIS: 20.0000 [IU]/kg | INTRAMUSCULAR | Status: DC | PRN

## 2015-06-01 MED ORDER — LIDOCAINE HCL (PF) 1 % IJ SOLN: 5.0000 mL | INTRAMUSCULAR | Status: DC | PRN

## 2015-06-01 MED ORDER — CALCIUM ACETATE (PHOS BINDER) 667 MG PO CAPS
2001.0000 mg | ORAL_CAPSULE | Freq: Three times a day (TID) | ORAL | Status: DC
Start: 2015-06-01 — End: 2015-06-03
  Administered 2015-06-01 – 2015-06-03 (×7): 2001 mg via ORAL
  Filled 2015-06-01 (×8): qty 3

## 2015-06-01 MED ORDER — LIDOCAINE-PRILOCAINE 2.5-2.5 % EX CREA: 1.0000 "application " | TOPICAL_CREAM | CUTANEOUS | Status: DC | PRN

## 2015-06-01 MED ORDER — ALTEPLASE 2 MG IJ SOLR: 2.0000 mg | Freq: Once | INTRAMUSCULAR | Status: DC | PRN

## 2015-06-01 NOTE — Progress Notes (Signed)
Patient's BP 184/102. On call NP notified. Awaiting orders. RN will continue to monitor patient.   Ermalinda Memos, RN

## 2015-06-01 NOTE — Progress Notes (Addendum)
PROGRESS NOTE  Javier Jenkins [id number] DOB: [birth year] DOA: 05/31/2015 PCP: Maximino Greenland, MD  HPI/Recap of past 24 hours: [name] is a 65 y.o. male with medical history significant of HTN and ESRD secondary to this who was admitted to the hospital for initiation of dialysis due to subacute development of nausea, decreased urine output and peripheral edema.  HD was initiated on 5/3 and he has tolerated this well.  Assessment/Plan:   Anemia in chronic kidney disease - stable, follow daily.   Essential hypertension - cont home meds   ESRD needing dialysis ([place]) - on HD, case management working on securing outpt HD. Dr. Florene Glen following   ESRD (end stage renal disease) [facility])   Seizure prophylaxis - cont home keppra   Hypothyroidism - cont synthroid   Code Status: FULL   Family Communication: Wife not present this AM.   Disposition Plan: Home in 2-3 days once outpatient HD set up.    Consultants:  Dr. Florene Glen of [facility]   Procedures:  HD 5/3   Antimicrobials:  None    Objective: Filed Vitals:   06/01/15 0305 06/01/15 0602 06/01/15 0640 06/01/15 0800  BP: 160/102 174/103 184/102 166/98  Pulse: 76 77  81  Temp:  98.2 F (36.8 C)  98.7 F (37.1 C)  TempSrc:  Oral  Oral  Resp: 16 18  18   Height:      Weight: 102.3 kg (225 lb 8.5 oz)     SpO2:  97%  98%    Intake/Output Summary (Last 24 hours) at 06/01/15 1224 Last data filed at 06/01/15 0600  Gross per 24 hour  Intake    120 ml  Output   2825 ml  Net  -2705 ml   Filed Weights   05/31/15 1931 06/01/15 0005 06/01/15 0305  Weight: 103.6 kg (228 lb 6.3 oz) 104.6 kg (230 lb 9.6 oz) 102.3 kg (225 lb 8.5 oz)    Exam:  General: Alert, oriented, calm, in no acute distress   Eyes: pupils round and reactive to light and accomodation, clear sclerea  Neck: supple, no masses, trachea mildline   Cardiovascular: RRR, no murmurs or rubs, he has no peripheral edema  Respiratory: clear to  auscultation bilaterally, no wheezes, no crackles   Abdomen: soft, nontender, distended, normal bowel tones heard   Skin: dry, no rashes   Musculoskeletal: no joint effusions, normal range of motion. Has LUE graft with palpable pulse.  Psychiatric: appropriate affect, normal speech   Neurologic: extraocular muscles intact, clear speech, moving all extremities but he is post-stroke on right side with contracture of right arm, right facial droop   Data Reviewed: CBC:  Recent Labs Lab 05/31/15 1159 06/01/15 0506  WBC 4.8 4.8  HGB 8.0* 8.3*  HCT 24.3* 25.7*  MCV 85.3 88.0  PLT 292 [id number]   Basic Metabolic Panel:  Recent Labs Lab 05/31/15 1159 06/01/15 0506 06/01/15 0756  NA 137 140  --   K 3.5 3.2*  --   CL 103 101  --   CO2 20* 23  --   GLUCOSE 138* 85  --   BUN 70* 39*  --   CREATININE 9.18* 6.18*  --   CALCIUM 8.9 8.9  --   PHOS  --   --  3.3   GFR: Estimated Creatinine Clearance: 14.1 mL/min (by C-G formula based on Cr of 6.18). Liver Function Tests:  Recent Labs Lab 05/31/15 1159  AST 21  ALT 16*  ALKPHOS 40  BILITOT 0.5  PROT 6.9  ALBUMIN 3.5    Recent Labs Lab 05/31/15 1159  LIPASE 27   No results for input(s): AMMONIA in the last 168 hours. Coagulation Profile: No results for input(s): INR, PROTIME in the last 168 hours. Cardiac Enzymes: No results for input(s): CKTOTAL, CKMB, CKMBINDEX, TROPONINI in the last 168 hours. BNP (last 3 results) No results for input(s): PROBNP in the last 8760 hours. HbA1C: No results for input(s): HGBA1C in the last 72 hours. CBG: No results for input(s): GLUCAP in the last 168 hours. Lipid Profile: No results for input(s): CHOL, HDL, LDLCALC, TRIG, CHOLHDL, LDLDIRECT in the last 72 hours. Thyroid Function Tests: No results for input(s): TSH, T4TOTAL, FREET4, T3FREE, THYROIDAB in the last 72 hours. Anemia Panel: No results for input(s): VITAMINB12, FOLATE, FERRITIN, TIBC, IRON, RETICCTPCT in the last 72  hours. Urine analysis:    Component Value Date/Time   COLORURINE YELLOW 05/31/2015 1816   APPEARANCEUR CLEAR 05/31/2015 1816   LABSPEC 1.012 05/31/2015 1816   PHURINE 5.5 05/31/2015 1816   GLUCOSEU NEGATIVE 05/31/2015 1816   HGBUR TRACE* 05/31/2015 1816   BILIRUBINUR NEGATIVE 05/31/2015 1816   KETONESUR NEGATIVE 05/31/2015 1816   PROTEINUR 100* 05/31/2015 1816   UROBILINOGEN 0.2 01/16/2013 0056   NITRITE NEGATIVE 05/31/2015 1816   LEUKOCYTESUR NEGATIVE 05/31/2015 1816   Sepsis Labs: @LABRCNTIP (procalcitonin:4,lacticidven:4)  )No results found for this or any previous visit (from the past 240 hour(s)).    Studies: Dg Chest Port 1 View  05/31/2015  CLINICAL DATA:  Abdominal pain.  Nausea and vomiting. EXAM: PORTABLE CHEST 1 VIEW COMPARISON:  The 07/19/2014; 01/15/2013 FINDINGS: Grossly unchanged enlarged cardiac silhouette and mediastinal contours given slightly reduced lung volumes. Atherosclerotic plaque within a mildly tortuous thoracic aorta. Minimal bibasilar heterogeneous opacities, favored to represent atelectasis. Mild pulmonary venous congestion left frank evidence of edema. No pleural effusion or pneumothorax. No acute osseus abnormalities. IMPRESSION: Cardiomegaly and bibasilar atelectasis without acute cardiopulmonary disease on this AP portable examination. Further evaluation with a PA and lateral chest radiograph may be obtained as clinically indicated. Electronically Signed   By: Sandi Mariscal M.D.   On: 05/31/2015 16:36    Scheduled Meds: . aspirin  325 mg Oral Daily  . calcium acetate  2,001 mg Oral TID WC  . cloNIDine  0.2 mg Oral BID  . docusate sodium  100 mg Oral Q12H  . doxazosin  8 mg Oral QHS  . isosorbide-hydrALAZINE  0.5 tablet Oral BID  . levETIRAcetam  500 mg Oral BID  . levothyroxine  25 mcg Oral QAC breakfast  . metoprolol  150 mg Oral BID  . multivitamin  1 tablet Oral QHS    Continuous Infusions:    LOS: 1 day   Time spent: [place], MD [facility] Pager [phone]  If 7PM-7AM, please contact night-coverage [email or web address] Password [email or web address] 06/01/2015, 12:24 PM

## 2015-06-01 NOTE — Progress Notes (Signed)
Assessment:  1 ESRD due to hypertension with symptomatic uremia 2 Anemia due to #1 3 Hx of CVA 4 S/P LUE AVF 5 Diabetes Mellitus 6 Hypertension, volume sensitive Plan: 1 HD today & do tommorow 2 Arrange OP HD  Subjective: Interval History: -2500cc with HD  Objective: Vital signs in last 24 hours: Temp:  [97.6 F (36.4 C)-98.2 F (36.8 C)] 98.2 F (36.8 C) (05/04 0602) Pulse Rate:  [56-77] 77 (05/04 0602) Resp:  [16-20] 18 (05/04 0602) BP: (160-234)/(92-132) 184/102 mmHg (05/04 0640) SpO2:  [97 %-100 %] 97 % (05/04 0602) Weight:  [102.3 kg (225 lb 8.5 oz)-104.6 kg (230 lb 9.6 oz)] 102.3 kg (225 lb 8.5 oz) (05/04 0305) Weight change:   Intake/Output from previous day: 05/03 0701 - 05/04 0700 In: 120 [P.O.:120] Out: 2825 [Urine:325] Intake/Output this shift:    Eyes: less puffiness Resp: clear to auscultation bilaterally Cardio: regular rate and rhythm, S1, S2 normal, no murmur, click, rub or gallop Extremities: edema 1-2+, prob less  Lab Results:  Recent Labs  05/31/15 1159 06/01/15 0506  WBC 4.8 4.8  HGB 8.0* 8.3*  HCT 24.3* 25.7*  PLT 292 268   BMET:  Recent Labs  05/31/15 1159 06/01/15 0506  NA 137 140  K 3.5 3.2*  CL 103 101  CO2 20* 23  GLUCOSE 138* 85  BUN 70* 39*  CREATININE 9.18* 6.18*  CALCIUM 8.9 8.9   No results for input(s): PTH in the last 72 hours. Iron Studies: No results for input(s): IRON, TIBC, TRANSFERRIN, FERRITIN in the last 72 hours. Studies/Results: Dg Chest Port 1 View  05/31/2015  CLINICAL DATA:  Abdominal pain.  Nausea and vomiting. EXAM: PORTABLE CHEST 1 VIEW COMPARISON:  The 07/19/2014; 01/15/2013 FINDINGS: Grossly unchanged enlarged cardiac silhouette and mediastinal contours given slightly reduced lung volumes. Atherosclerotic plaque within a mildly tortuous thoracic aorta. Minimal bibasilar heterogeneous opacities, favored to represent atelectasis. Mild pulmonary venous congestion left frank evidence of edema. No pleural  effusion or pneumothorax. No acute osseus abnormalities. IMPRESSION: Cardiomegaly and bibasilar atelectasis without acute cardiopulmonary disease on this AP portable examination. Further evaluation with a PA and lateral chest radiograph may be obtained as clinically indicated. Electronically Signed   By: Sandi Mariscal M.D.   On: 05/31/2015 16:36    Scheduled: . aspirin  325 mg Oral Daily  . calcium acetate  667 mg Oral BID  . cloNIDine  0.2 mg Oral BID  . docusate sodium  100 mg Oral Q12H  . doxazosin  8 mg Oral QHS  . isosorbide-hydrALAZINE  0.5 tablet Oral BID  . levETIRAcetam  500 mg Oral BID  . levothyroxine  25 mcg Oral QAC breakfast  . metoprolol  150 mg Oral BID     LOS: 1 day    Marlisha Vanwyk C 06/01/2015,7:37 AM

## 2015-06-01 NOTE — Progress Notes (Signed)
Order for Hydralazine 5mg  placed. Order implemented. Will inform oncoming RN to recheck BP.   Ermalinda Memos, RN

## 2015-06-02 DIAGNOSIS — R140 Abdominal distension (gaseous): Secondary | ICD-10-CM

## 2015-06-02 LAB — BASIC METABOLIC PANEL
ANION GAP: 14 (ref 5–15)
BUN: 32 mg/dL — AB (ref 6–20)
CHLORIDE: 99 mmol/L — AB (ref 101–111)
CO2: 26 mmol/L (ref 22–32)
Calcium: 9.8 mg/dL (ref 8.9–10.3)
Creatinine, Ser: 5.99 mg/dL — ABNORMAL HIGH (ref 0.61–1.24)
GFR calc Af Amer: 10 mL/min — ABNORMAL LOW (ref >60)
GFR calc non Af Amer: 9 mL/min — ABNORMAL LOW (ref >60)
GLUCOSE: 143 mg/dL — AB (ref 65–99)
POTASSIUM: 3.5 mmol/L (ref 3.5–5.1)
Sodium: 139 mmol/L (ref 135–145)

## 2015-06-02 LAB — HEPATITIS B SURFACE ANTIGEN: Hepatitis B Surface Ag: NEGATIVE

## 2015-06-02 LAB — HEPATITIS B SURFACE ANTIBODY,QUALITATIVE: Hep B S Ab: NONREACTIVE

## 2015-06-02 LAB — HEPATITIS B CORE ANTIBODY, TOTAL: Hep B Core Total Ab: NEGATIVE

## 2015-06-02 MED ORDER — SODIUM CHLORIDE 0.9 % IV SOLN: 100.0000 mL | INTRAVENOUS | Status: DC | PRN

## 2015-06-02 MED ORDER — HEPARIN SODIUM (PORCINE) 1000 UNIT/ML DIALYSIS: 1000.0000 [IU] | INTRAMUSCULAR | Status: DC | PRN

## 2015-06-02 MED ORDER — LIDOCAINE-PRILOCAINE 2.5-2.5 % EX CREA: 1.0000 "application " | TOPICAL_CREAM | CUTANEOUS | Status: DC | PRN

## 2015-06-02 MED ORDER — PENTAFLUOROPROP-TETRAFLUOROETH EX AERO: 1.0000 "application " | INHALATION_SPRAY | CUTANEOUS | Status: DC | PRN

## 2015-06-02 MED ORDER — HEPARIN SODIUM (PORCINE) 1000 UNIT/ML DIALYSIS: 20.0000 [IU]/kg | INTRAMUSCULAR | Status: DC | PRN

## 2015-06-02 MED ORDER — LIDOCAINE HCL (PF) 1 % IJ SOLN: 5.0000 mL | INTRAMUSCULAR | Status: DC | PRN

## 2015-06-02 MED ORDER — ALTEPLASE 2 MG IJ SOLR: 2.0000 mg | Freq: Once | INTRAMUSCULAR | Status: DC | PRN

## 2015-06-02 NOTE — Progress Notes (Signed)
Assessment:  1 ESRD due to hypertension with symptomatic uremia 2 Anemia due to #1 3 Hx of CVA 4 S/P LUE AVF 5 Diabetes Mellitus 6 Hypertension, volume sensitive Plan: 1 HD today  2 Arrange OP HD  Subjective: Interval History: Less nausea, breathing better  Objective: Vital signs in last 24 hours: Temp:  [97.4 F (36.3 C)-98.2 F (36.8 C)] 98.2 F (36.8 C) (05/05 1020) Pulse Rate:  [59-85] 85 (05/05 1020) Resp:  [16-18] 18 (05/05 1020) BP: (145-171)/(91-109) 162/98 mmHg (05/05 1020) SpO2:  [97 %-99 %] 97 % (05/05 1020) Weight:  [89.6 kg (197 lb 8.5 oz)-92.6 kg (204 lb 2.3 oz)] 89.6 kg (197 lb 8.5 oz) (05/04 2002) Weight change: -11 kg (-24 lb 4 oz)  Intake/Output from previous day: 05/04 0701 - 05/05 0700 In: 240 [P.O.:240] Out: 3100 [Urine:100] Intake/Output this shift: Total I/O In: 240 [P.O.:240] Out: -   General appearance: alert and cooperative Resp: clear to auscultation bilaterally Cardio: regular rate and rhythm, S1, S2 normal, no murmur, click, rub or gallop Extremities: edema 2+ right, 1+ left  Lab Results:  Recent Labs  05/31/15 1159 06/01/15 0506  WBC 4.8 4.8  HGB 8.0* 8.3*  HCT 24.3* 25.7*  PLT 292 268   BMET:  Recent Labs  06/01/15 0506 06/02/15 0918  NA 140 139  K 3.2* 3.5  CL 101 99*  CO2 23 26  GLUCOSE 85 143*  BUN 39* 32*  CREATININE 6.18* 5.99*  CALCIUM 8.9 9.8   No results for input(s): PTH in the last 72 hours. Iron Studies: No results for input(s): IRON, TIBC, TRANSFERRIN, FERRITIN in the last 72 hours. Studies/Results: Dg Chest Port 1 View  05/31/2015  CLINICAL DATA:  Abdominal pain.  Nausea and vomiting. EXAM: PORTABLE CHEST 1 VIEW COMPARISON:  The 07/19/2014; 01/15/2013 FINDINGS: Grossly unchanged enlarged cardiac silhouette and mediastinal contours given slightly reduced lung volumes. Atherosclerotic plaque within a mildly tortuous thoracic aorta. Minimal bibasilar heterogeneous opacities, favored to represent  atelectasis. Mild pulmonary venous congestion left frank evidence of edema. No pleural effusion or pneumothorax. No acute osseus abnormalities. IMPRESSION: Cardiomegaly and bibasilar atelectasis without acute cardiopulmonary disease on this AP portable examination. Further evaluation with a PA and lateral chest radiograph may be obtained as clinically indicated. Electronically Signed   By: Sandi Mariscal M.D.   On: 05/31/2015 16:36    Scheduled: . aspirin  325 mg Oral Daily  . calcium acetate  2,001 mg Oral TID WC  . cloNIDine  0.2 mg Oral BID  . docusate sodium  100 mg Oral Q12H  . doxazosin  8 mg Oral QHS  . isosorbide-hydrALAZINE  0.5 tablet Oral BID  . levETIRAcetam  500 mg Oral BID  . levothyroxine  25 mcg Oral QAC breakfast  . metoprolol  150 mg Oral BID  . multivitamin  1 tablet Oral QHS      LOS: 2 days   Javier Jenkins C 06/02/2015,1:34 PM

## 2015-06-02 NOTE — Progress Notes (Signed)
Can  Start at [facility] on Tuesday May 9th need to be there at 11:30 to sign paperwork will be Tuesday ,Thursday and Saturday 2nd shift pending on Nephrologist approval

## 2015-06-02 NOTE — Progress Notes (Signed)
PROGRESS NOTE  Javier Jenkins [id number] DOB: 04/13/1950 DOA: 05/31/2015 PCP: Maximino Greenland, MD  HPI/Recap of past 24 hours: Javier Jenkins is a 65 y.o. male with medical history significant of HTN and ESRD secondary to this who was admitted to the hospital for initiation of dialysis due to subacute development of nausea, decreased urine output and peripheral edema.  HD was initiated on 5/3 and he has tolerated this well.  Assessment/Plan: Active Problems:   Anemia in chronic kidney disease - stable, follow daily.   Essential hypertension - cont home meds   ESRD needing dialysis ([place]) - on HD, case management working on securing outpt HD. Dr. Florene Glen following   ESRD (end stage renal disease) [facility])   Seizure prophylaxis - cont home keppra   Hypothyroidism - cont synthroid   Code Status: FULL   Family Communication: Wife not present this AM.   Disposition Plan: Home likely in AM once outpatient HD set up.    Consultants:  Dr. Florene Glen of [facility]   Procedures:  HD 5/3   Antimicrobials:  None    Objective: Filed Vitals:   06/01/15 1930 06/01/15 2002 06/01/15 2005 06/01/15 2120  BP: 167/109 149/99 171/96 147/91  Pulse: 65 65 61 80  Temp:  97.4 F (36.3 C)  97.9 F (36.6 C)  TempSrc:    Oral  Resp:  16  18  Height:      Weight:  89.6 kg (197 lb 8.5 oz)    SpO2:    99%    Intake/Output Summary (Last 24 hours) at 06/02/15 0818 Last data filed at 06/01/15 2005  Gross per 24 hour  Intake    240 ml  Output   3100 ml  Net  -2860 ml   Filed Weights   06/01/15 0305 06/01/15 1702 06/01/15 2002  Weight: 102.3 kg (225 lb 8.5 oz) 92.6 kg (204 lb 2.3 oz) 89.6 kg (197 lb 8.5 oz)    Exam:  General: Alert, oriented, calm, in no acute distress   Eyes: pupils round and reactive to light and accomodation, clear sclerea  Neck: supple, no masses, trachea mildline   Cardiovascular: RRR, no murmurs or rubs, he has no peripheral edema  Respiratory: clear to  auscultation bilaterally, no wheezes, no crackles   Abdomen: soft, nontender, much less distended, normal bowel tones heard   Skin: dry, no rashes   Musculoskeletal: no joint effusions, normal range of motion. Has LUE graft with palpable pulse.  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 Liver Function Tests:  Recent Labs Lab 05/31/15 1159  AST 21  ALT 16*  ALKPHOS 40  BILITOT 0.5  PROT 6.9  ALBUMIN 3.5    Recent Labs Lab 05/31/15 1159  LIPASE 27   No results for input(s): AMMONIA in the last 168 hours. Coagulation Profile: No results for input(s): INR, PROTIME in the last 168 hours. Cardiac Enzymes: No results for input(s): CKTOTAL, CKMB, CKMBINDEX, TROPONINI in the last 168 hours. BNP (last 3 results) No results for input(s): PROBNP in the last 8760 hours. HbA1C: No results for input(s): HGBA1C in the last 72 hours. CBG: No results for input(s): GLUCAP in the last 168 hours. Lipid Profile: No results for input(s): CHOL, HDL, LDLCALC, TRIG, CHOLHDL, LDLDIRECT in the last 72 hours. Thyroid Function Tests: No results for input(s): TSH, T4TOTAL, FREET4, T3FREE, THYROIDAB in the last 72 hours. Anemia Panel: No results for input(s): VITAMINB12, FOLATE, FERRITIN, TIBC, IRON, RETICCTPCT in  the last 72 hours. Urine analysis:    Component Value Date/Time   COLORURINE YELLOW 05/31/2015 1816   APPEARANCEUR CLEAR 05/31/2015 1816   LABSPEC 1.012 05/31/2015 1816   PHURINE 5.5 05/31/2015 1816   GLUCOSEU NEGATIVE 05/31/2015 1816   HGBUR TRACE* 05/31/2015 1816   BILIRUBINUR NEGATIVE 05/31/2015 1816   KETONESUR NEGATIVE 05/31/2015 1816   PROTEINUR 100* 05/31/2015 1816   UROBILINOGEN 0.2 01/16/2013 0056   NITRITE NEGATIVE 05/31/2015 1816   LEUKOCYTESUR NEGATIVE 05/31/2015 1816   Sepsis Labs: @LABRCNTIP (procalcitonin:4,lacticidven:4)  )No results found for this or any previous visit (from the past 240 hour(s)).    Studies: No results found.  Scheduled Meds: . aspirin  325 mg Oral Daily  . calcium acetate  2,001 mg Oral TID WC  . cloNIDine  0.2 mg Oral BID  . docusate sodium  100 mg Oral Q12H  . doxazosin  8 mg Oral QHS  . isosorbide-hydrALAZINE  0.5 tablet Oral BID  . levETIRAcetam  500 mg Oral BID  . levothyroxine  25 mcg Oral QAC breakfast  . metoprolol  150 mg Oral BID  . multivitamin  1 tablet Oral QHS    Continuous Infusions:    LOS: 2 days   Time spent: [place], MD [facility] Pager [phone]  If 7PM-7AM, please contact night-coverage [email or web address] Password [email or web address] 06/02/2015, 8:18 AM

## 2015-06-03 LAB — CBC
HEMATOCRIT: 27.4 % — AB (ref 39.0–52.0)
HEMOGLOBIN: 8.8 g/dL — AB (ref 13.0–17.0)
MCH: 29 pg (ref 26.0–34.0)
MCHC: 32.1 g/dL (ref 30.0–36.0)
MCV: 90.4 fL (ref 78.0–100.0)
Platelets: 268 10*3/uL (ref 150–400)
RBC: 3.03 MIL/uL — ABNORMAL LOW (ref 4.22–5.81)
RDW: 15.5 % (ref 11.5–15.5)
WBC: 7 10*3/uL (ref 4.0–10.5)

## 2015-06-03 LAB — BASIC METABOLIC PANEL
ANION GAP: 16 — AB (ref 5–15)
BUN: 22 mg/dL — ABNORMAL HIGH (ref 6–20)
CO2: 25 mmol/L (ref 22–32)
Calcium: 9.6 mg/dL (ref 8.9–10.3)
Chloride: 96 mmol/L — ABNORMAL LOW (ref 101–111)
Creatinine, Ser: 5.38 mg/dL — ABNORMAL HIGH (ref 0.61–1.24)
GFR calc Af Amer: 11 mL/min — ABNORMAL LOW (ref >60)
GFR, EST NON AFRICAN AMERICAN: 10 mL/min — AB (ref >60)
GLUCOSE: 98 mg/dL (ref 65–99)
Potassium: 4.1 mmol/L (ref 3.5–5.1)
SODIUM: 137 mmol/L (ref 135–145)

## 2015-06-03 MED ORDER — RENA-VITE PO TABS: 1.0000 | ORAL_TABLET | Freq: Every day | ORAL | Status: AC

## 2015-06-03 MED ORDER — PENTAFLUOROPROP-TETRAFLUOROETH EX AERO: 1.0000 "application " | INHALATION_SPRAY | CUTANEOUS | Status: DC | PRN

## 2015-06-03 MED ORDER — FUROSEMIDE 80 MG PO TABS
80.0000 mg | ORAL_TABLET | Freq: Once | ORAL | Status: AC
  Administered 2015-06-03: 80 mg via ORAL
  Filled 2015-06-03: qty 1

## 2015-06-03 NOTE — Progress Notes (Signed)
Patient requested to have bed alarm off.

## 2015-06-03 NOTE — Progress Notes (Signed)
Patient had about 200 mL of emesis.  Patient states he feels better now.  Nausea came on after patient ate breakfast.  Wife concerned and wants to speak with MD.  MD notified.

## 2015-06-03 NOTE — Progress Notes (Signed)
Assessment:  1 ESRD due to hypertension with symptomatic uremia, resolved 2 Anemia due to #1 3 Hx of CVA 4 S/P LUE AVF 5 Diabetes Mellitus 6 Hypertension, volume sensitive---improved Plan: 1 HD at [facility] T[facility], no HD today since had 3 days in a row.  Will need Micera. 2 Discharge Ok  Subjective: Interval History: Feels better  Objective: Vital signs in last 24 hours: Temp:  [97.6 F (36.4 C)-98.2 F (36.8 C)] 98 F (36.7 C) (05/06 0639) Pulse Rate:  [58-85] 65 (05/06 0639) Resp:  [10-20] 16 (05/06 0639) BP: (105-162)/(75-98) 133/84 mmHg (05/06 0639) SpO2:  [97 %-98 %] 97 % (05/06 0639) Weight:  [86.7 kg (191 lb 2.2 oz)-96.8 kg (213 lb 6.5 oz)] 96.8 kg (213 lb 6.5 oz) (05/06 0639) Weight change: -2.7 kg (-5 lb 15.2 oz)  Intake/Output from previous day: 05/05 0701 - 05/06 0700 In: 360 [P.O.:360] Out: 3000  Intake/Output this shift:    General appearance: alert and cooperative Resp: clear to auscultation bilaterally GI: soft, non-tender; bowel sounds normal; no masses,  no organomegaly Extremities: edema tr Neurologic: Motor: right hemiparesis  Lab Results:  Recent Labs  06/01/15 0506 06/03/15 0555  WBC 4.8 7.0  HGB 8.3* 8.8*  HCT 25.7* 27.4*  PLT 268 268   BMET:  Recent Labs  06/01/15 0506 06/02/15 0918  NA 140 139  K 3.2* 3.5  CL 101 99*  CO2 23 26  GLUCOSE 85 143*  BUN 39* 32*  CREATININE 6.18* 5.99*  CALCIUM 8.9 9.8   No results for input(s): PTH in the last 72 hours. Iron Studies: No results for input(s): IRON, TIBC, TRANSFERRIN, FERRITIN in the last 72 hours. Studies/Results: No results found.  Scheduled: . aspirin  325 mg Oral Daily  . calcium acetate  2,001 mg Oral TID WC  . cloNIDine  0.2 mg Oral BID  . docusate sodium  100 mg Oral Q12H  . doxazosin  8 mg Oral QHS  . isosorbide-hydrALAZINE  0.5 tablet Oral BID  . levETIRAcetam  500 mg Oral BID  . levothyroxine  25 mcg Oral QAC breakfast  . metoprolol  150 mg Oral BID  . multivitamin   1 tablet Oral QHS     LOS: 3 days   Ayo Guarino C 06/03/2015,7:59 AM

## 2015-06-03 NOTE — Discharge Summary (Signed)
Discharge Summary  Javier Jenkins [id number] DOB: [birth year]  PCP: Maximino Greenland, MD  Admit date: 05/31/2015 Discharge date: 06/03/2015   Recommendations for Outpatient Follow-up:  1. Dr. Florene Glen Nephrology within 2 weeks 2. HD [place] on Tuesday May 9th need to be there at 11:30 to sign paperwork  Discharge Diagnoses:  Active Hospital Problems   Diagnosis Date Noted  . Abdominal bloating   . ESRD needing dialysis ([place]) 05/31/2015  . ESRD (end stage renal disease) ([place]) 05/31/2015  . Seizure prophylaxis 05/31/2015  . Hypothyroidism 05/31/2015  . Essential hypertension 04/13/2015  . Anemia in chronic kidney disease 04/13/2015    Resolved Hospital Problems   Diagnosis Date Noted Date Resolved  No resolved problems to display.    Discharge Condition: Stable   Diet recommendation: Renal   Filed Vitals:   06/02/15 2056 06/03/15 0639  BP: 148/96 133/84  Pulse: 78 65  Temp: 97.8 F (36.6 C) 98 F (36.7 C)  Resp: 16 16    History of present illness:  Javier Jenkins is a 65 y.o. male with medical history significant of HTN and ESRD secondary to this who was admitted to the hospital for initiation of dialysis due to subacute development of nausea, decreased urine output and peripheral edema. HD was initiated on 5/3 and he has tolerated this well.  Hospital Course:  Active Problems:   Anemia in chronic kidney disease   Essential hypertension   ESRD needing dialysis ([place])   ESRD (end stage renal disease) ([facility])   Seizure prophylaxis   Hypothyroidism   Abdominal bloating  Was seen daily by his nephrologist Dr. Florene Glen, had 3 days in a row of HD which he tolerated very well, with reduction in his peripheral and abdominal swelling. Complete resolution of his nausea as well.   Procedures:  HD   Consultations:  Dr. Florene Glen of Nephrology   Discharge Exam: BP 133/84 mmHg  Pulse 65  Temp(Src) 98 F (36.7 C) (Oral)  Resp 16  Ht 5\' 11"  (1.803 m)  Wt  86.5 kg (190 lb 11.2 oz)  BMI 26.61 kg/m2  SpO2 97% General:  Alert, oriented, calm, in no acute distress  Eyes: pupils round and reactive to light and accomodation, clear sclerea Neck: supple, no masses, trachea mildline  Cardiovascular: RRR, no murmurs or rubs, no peripheral edema  Respiratory: clear to auscultation bilaterally, no wheezes, no crackles  Abdomen: soft, nontender, nondistended, normal bowel tones heard  Skin: dry, no rashes  Musculoskeletal: no joint effusions, normal range of motion  Psychiatric: appropriate affect, normal speech  Neurologic: extraocular muscles intact, clear speech, moving all extremities with intact sensorium    Discharge Instructions You were cared for by a hospitalist during your hospital stay. If you have any questions about your discharge medications or the care you received while you were in the hospital after you are discharged, you can call the unit and asked to speak with the hospitalist on call if the hospitalist that took care of you is not available. Once you are discharged, your primary care physician will handle any further medical issues. Please note that NO REFILLS for any discharge medications will be authorized once you are discharged, as it is imperative that you return to your primary care physician (or establish a relationship with a primary care physician if you do not have one) for your aftercare needs so that they can reassess your need for medications and monitor your lab values.     Medication List  STOP taking these medications        furosemide 80 MG tablet  Commonly known as:  LASIX     potassium chloride SA 20 MEQ tablet  Commonly known as:  K-DUR,KLOR-CON      TAKE these medications        aspirin 325 MG tablet  Take 1 tablet (325 mg total) by mouth daily.     calcium acetate 667 MG capsule  Commonly known as:  PHOSLO  Take 1 capsule by mouth 2 (two) times daily.     cloNIDine 0.2 MG tablet  Commonly known  as:  CATAPRES  Take 0.4 mg by mouth 2 (two) times daily.     docusate sodium 100 MG capsule  Commonly known as:  COLACE  Take 1 capsule (100 mg total) by mouth every 12 (twelve) hours.     doxazosin 8 MG tablet  Commonly known as:  CARDURA  Take 8 mg by mouth at bedtime.     isosorbide-hydrALAZINE 20-37.5 MG tablet  Commonly known as:  BIDIL  Take 0.5 tablets by mouth 2 (two) times daily.     levETIRAcetam 500 MG tablet  Commonly known as:  KEPPRA  Take 1 tablet (500 mg total) by mouth 2 (two) times daily.     levothyroxine 25 MCG tablet  Commonly known as:  SYNTHROID, LEVOTHROID  Take 25 mcg by mouth daily.     metoprolol 100 MG tablet  Commonly known as:  LOPRESSOR  Take 150 mg by mouth 2 (two) times daily.     multivitamin Tabs tablet  Take 1 tablet by mouth at bedtime.     OVER THE COUNTER MEDICATION  Take 1 capsule by mouth 2 (two) times daily. "Kidney Factor"     oxyCODONE-acetaminophen 5-325 MG tablet  Commonly known as:  PERCOCET/ROXICET  Take 1 tablet by mouth every 6 (six) hours as needed.     pentafluoroprop-tetrafluoroeth Aero  Commonly known as:  GEBAUERS  Apply 1 application topically as needed (topical anesthesia for hemodialysis).       No Known Allergies    The results of significant diagnostics from this hospitalization (including imaging, microbiology, ancillary and laboratory) are listed below for reference.    Significant Diagnostic Studies: Dg Chest Port 1 View  05/31/2015  CLINICAL DATA:  Abdominal pain.  Nausea and vomiting. EXAM: PORTABLE CHEST 1 VIEW COMPARISON:  The 07/19/2014; 01/15/2013 FINDINGS: Grossly unchanged enlarged cardiac silhouette and mediastinal contours given slightly reduced lung volumes. Atherosclerotic plaque within a mildly tortuous thoracic aorta. Minimal bibasilar heterogeneous opacities, favored to represent atelectasis. Mild pulmonary venous congestion left frank evidence of edema. No pleural effusion or pneumothorax.  No acute osseus abnormalities. IMPRESSION: Cardiomegaly and bibasilar atelectasis without acute cardiopulmonary disease on this AP portable examination. Further evaluation with a PA and lateral chest radiograph may be obtained as clinically indicated. Electronically Signed   By: Sandi Mariscal M.D.   On: 05/31/2015 16:36    Microbiology: No results found for this or any previous visit (from the past 240 hour(s)).   Labs: Basic Metabolic Panel:  Recent Labs Lab 05/31/15 1159 06/01/15 0506 06/01/15 0756 06/02/15 0918 06/03/15 0555  NA 137 140  --  139 137  K 3.5 3.2*  --  3.5 4.1  CL 103 101  --  99* 96*  CO2 20* 23  --  26 25  GLUCOSE 138* 85  --  143* 98  BUN 70* 39*  --  32* 22*  CREATININE 9.18* 6.18*  --  5.99* 5.38*  CALCIUM 8.9 8.9  --  9.8 9.6  PHOS  --   --  3.3  --   --    Liver Function Tests:  Recent Labs Lab 05/31/15 1159  AST 21  ALT 16*  ALKPHOS 40  BILITOT 0.5  PROT 6.9  ALBUMIN 3.5    Recent Labs Lab 05/31/15 1159  LIPASE 27   No results for input(s): AMMONIA in the last 168 hours. CBC:  Recent Labs Lab 05/31/15 1159 06/01/15 0506 06/03/15 0555  WBC 4.8 4.8 7.0  HGB 8.0* 8.3* 8.8*  HCT 24.3* 25.7* 27.4*  MCV 85.3 88.0 90.4  PLT 292 268 268   Cardiac Enzymes: No results for input(s): CKTOTAL, CKMB, CKMBINDEX, TROPONINI in the last 168 hours. BNP: BNP (last 3 results)  Recent Labs  06/29/14 1810  BNP 74.9    ProBNP (last 3 results) No results for input(s): PROBNP in the last 8760 hours.  CBG: No results for input(s): GLUCAP in the last 168 hours.  Time spent: 41 minutes were spent in preparing this discharge including medication reconciliation, counseling, and coordination of care.  Signed:  Ustin Cruickshank [employer]  [facility] 06/03/2015, 9:03 AM

## 2015-06-04 ENCOUNTER — Emergency Department (HOSPITAL_COMMUNITY): Payer: PRIVATE HEALTH INSURANCE

## 2015-06-04 ENCOUNTER — Emergency Department (HOSPITAL_COMMUNITY)
Admission: EM | Admit: 2015-06-04 | Discharge: 2015-06-04 | Disposition: A | Discharge status: Home / Self Care | Payer: PRIVATE HEALTH INSURANCE | Attending: Emergency Medicine | Admitting: Emergency Medicine

## 2015-06-04 ENCOUNTER — Encounter (HOSPITAL_COMMUNITY): Payer: Self-pay

## 2015-06-04 DIAGNOSIS — Z992 Dependence on renal dialysis: Secondary | ICD-10-CM | POA: Insufficient documentation

## 2015-06-04 DIAGNOSIS — R110 Nausea: Secondary | ICD-10-CM | POA: Diagnosis not present

## 2015-06-04 DIAGNOSIS — R112 Nausea with vomiting, unspecified: Secondary | ICD-10-CM

## 2015-06-04 DIAGNOSIS — N185 Chronic kidney disease, stage 5: Secondary | ICD-10-CM | POA: Insufficient documentation

## 2015-06-04 DIAGNOSIS — K5900 Constipation, unspecified: Secondary | ICD-10-CM | POA: Insufficient documentation

## 2015-06-04 DIAGNOSIS — Z7982 Long term (current) use of aspirin: Secondary | ICD-10-CM | POA: Diagnosis not present

## 2015-06-04 DIAGNOSIS — Z8673 Personal history of transient ischemic attack (TIA), and cerebral infarction without residual deficits: Secondary | ICD-10-CM | POA: Diagnosis not present

## 2015-06-04 DIAGNOSIS — E039 Hypothyroidism, unspecified: Secondary | ICD-10-CM | POA: Diagnosis not present

## 2015-06-04 DIAGNOSIS — Z87891 Personal history of nicotine dependence: Secondary | ICD-10-CM | POA: Insufficient documentation

## 2015-06-04 DIAGNOSIS — R590 Localized enlarged lymph nodes: Secondary | ICD-10-CM

## 2015-06-04 DIAGNOSIS — I120 Hypertensive chronic kidney disease with stage 5 chronic kidney disease or end stage renal disease: Secondary | ICD-10-CM | POA: Insufficient documentation

## 2015-06-04 DIAGNOSIS — E1122 Type 2 diabetes mellitus with diabetic chronic kidney disease: Secondary | ICD-10-CM | POA: Insufficient documentation

## 2015-06-04 DIAGNOSIS — Z79899 Other long term (current) drug therapy: Secondary | ICD-10-CM | POA: Insufficient documentation

## 2015-06-04 DIAGNOSIS — R109 Unspecified abdominal pain: Secondary | ICD-10-CM

## 2015-06-04 DIAGNOSIS — R1013 Epigastric pain: Secondary | ICD-10-CM | POA: Diagnosis not present

## 2015-06-04 DIAGNOSIS — K2970 Gastritis, unspecified, without bleeding: Secondary | ICD-10-CM | POA: Diagnosis not present

## 2015-06-04 LAB — COMPREHENSIVE METABOLIC PANEL
ALBUMIN: 4.1 g/dL (ref 3.5–5.0)
ALK PHOS: 46 U/L (ref 38–126)
ALT: 18 U/L (ref 17–63)
ANION GAP: 17 — AB (ref 5–15)
AST: 33 U/L (ref 15–41)
BILIRUBIN TOTAL: 0.4 mg/dL (ref 0.3–1.2)
BUN: 30 mg/dL — AB (ref 6–20)
CALCIUM: 9.8 mg/dL (ref 8.9–10.3)
CO2: 24 mmol/L (ref 22–32)
Chloride: 95 mmol/L — ABNORMAL LOW (ref 101–111)
Creatinine, Ser: 6.91 mg/dL — ABNORMAL HIGH (ref 0.61–1.24)
GFR calc Af Amer: 8 mL/min — ABNORMAL LOW (ref >60)
GFR, EST NON AFRICAN AMERICAN: 7 mL/min — AB (ref >60)
GLUCOSE: 117 mg/dL — AB (ref 65–99)
POTASSIUM: 3.9 mmol/L (ref 3.5–5.1)
Sodium: 136 mmol/L (ref 135–145)
TOTAL PROTEIN: 8.1 g/dL (ref 6.5–8.1)

## 2015-06-04 LAB — CBC
HEMATOCRIT: 27.8 % — AB (ref 39.0–52.0)
HEMOGLOBIN: 8.7 g/dL — AB (ref 13.0–17.0)
MCH: 28.6 pg (ref 26.0–34.0)
MCHC: 31.3 g/dL (ref 30.0–36.0)
MCV: 91.4 fL (ref 78.0–100.0)
Platelets: 251 10*3/uL (ref 150–400)
RBC: 3.04 MIL/uL — ABNORMAL LOW (ref 4.22–5.81)
RDW: 15.4 % (ref 11.5–15.5)
WBC: 8.2 10*3/uL (ref 4.0–10.5)

## 2015-06-04 LAB — LIPASE, BLOOD: LIPASE: 20 U/L (ref 11–51)

## 2015-06-04 MED ORDER — MORPHINE SULFATE (PF) 4 MG/ML IV SOLN
8.0000 mg | Freq: Once | INTRAVENOUS | Status: AC
  Administered 2015-06-04: 8 mg via INTRAMUSCULAR
  Filled 2015-06-04: qty 2

## 2015-06-04 MED ORDER — DIATRIZOATE MEGLUMINE & SODIUM 66-10 % PO SOLN
ORAL | Status: AC
  Filled 2015-06-04: qty 30

## 2015-06-04 MED ORDER — OXYCODONE-ACETAMINOPHEN 5-325 MG PO TABS: 1.0000 | ORAL_TABLET | ORAL | Status: DC | PRN

## 2015-06-04 MED ORDER — ONDANSETRON HCL 4 MG/2ML IJ SOLN
4.0000 mg | Freq: Once | INTRAMUSCULAR | Status: DC
  Filled 2015-06-04: qty 2

## 2015-06-04 MED ORDER — ONDANSETRON 4 MG PO TBDP
8.0000 mg | ORAL_TABLET | Freq: Once | ORAL | Status: AC
  Administered 2015-06-04: 8 mg via ORAL
  Filled 2015-06-04: qty 2

## 2015-06-04 MED ORDER — MORPHINE SULFATE (PF) 4 MG/ML IV SOLN
4.0000 mg | Freq: Once | INTRAVENOUS | Status: DC
  Filled 2015-06-04: qty 1

## 2015-06-04 MED ORDER — ONDANSETRON 8 MG PO TBDP: 8.0000 mg | ORAL_TABLET | Freq: Three times a day (TID) | ORAL | Status: DC | PRN

## 2015-06-04 NOTE — ED Notes (Signed)
MD at bedside. 

## 2015-06-04 NOTE — ED Notes (Signed)
IV team unable to gain IV access; Md notified

## 2015-06-04 NOTE — ED Notes (Signed)
Per EMS pt was an admitted pt here and was release 7 hours ago; pt received first three dialysis tx while here; shunt in upper left arm; pt started having n/v before discharge home; pt has not been able to keep in anything down since being home. Pt is from home with wife. Pt has right side deficit from previous stroke. Pt walks with assistance; pt c/o of abdominal pain at 6/10.

## 2015-06-04 NOTE — ED Provider Notes (Signed)
CSN: [id number]     Arrival date & time 06/04/15  0027 History   By signing my name below, I, Forrestine Him, attest that this documentation has been prepared under the direction and in the presence of Jola Schmidt, MD.  Electronically Signed: Forrestine Him, ED Scribe. 06/04/2015. 1:05 AM.   Chief Complaint  Patient presents with  . Nausea  . Emesis   The history is provided by the patient. No language interpreter was used.    HPI Comments: Javier Jenkins brought in by EMS is a 65 y.o. male with a PMHx of HTN, stroke, chronic kidney disease, and DM who presents to the Emergency Department complaining of intermittent, ongoing nausea and vomiting x 1 day. Last episode earlier this evening after eating dinner. No blood noted in emesis. Pt also reports ongoing constipation with last normal bowel movement 3 days ago. Pt was recently started on dialysis 3 days ago and was released from the hospital 7 hours ago. During his stay, pt admits to having ongoing nausea and vomiting. Symptoms resolved towards the end of his stay after doses of Zofran and returned shortly after being home for the first time since hospitalization. Wife states pt was dialyzed on Wednesday, Thursday, and Friday of this week and will move to a Tuesday, Thursday, Saturday schedule. No known allergies to medications.   PCP: Maximino Greenland, MD   NEPHROLOGIST: Erling Cruz, MD  Past Medical History  Diagnosis Date  . Hypertension   . Stroke [facility])     right side paralysis  . Hypothyroidism   . Diabetes mellitus without complication ([place])     not on medications  . Chronic kidney disease     stage 5  . Constipation    Past Surgical History  Procedure Laterality Date  . Hemorrhoid surgery    . Av fistula placement Left 10/18/2014    Procedure: ARTERIOVENOUS (AV) FISTULA CREATION;  Surgeon: Angelia Mould, MD;  Location: [place];  Service: Vascular;  Laterality: Left;  . Burr hole Right 03/17/2015    Procedure: BURR  HOLES FOR RIGHT SUBDURAL HEMATOMA;  Surgeon: Leeroy Cha, MD;  Location: [place] [facility];  Service: Neurosurgery;  Laterality: Right;   Family History  Problem Relation Age of Onset  . Hypertension Mother    Social History  Substance Use Topics  . Smoking status: Former Smoker    Types: Pipe    Quit date: 09/22/1984  . Smokeless tobacco: Never Used  . Alcohol Use: No    Review of Systems  All other systems reviewed and are negative.     Allergies  Review of patient's allergies indicates no known allergies.  Home Medications   Prior to Admission medications   Medication Sig Start Date End Date Taking? Authorizing Provider  aspirin 325 MG tablet Take 1 tablet (325 mg total) by mouth daily. 04/21/15   Reyne Dumas, MD  calcium acetate (PHOSLO) 667 MG capsule Take 1 capsule by mouth 2 (two) times daily.  04/06/15   Historical Provider, MD  cloNIDine (CATAPRES) 0.2 MG tablet Take 0.4 mg by mouth 2 (two) times daily.     Historical Provider, MD  docusate sodium (COLACE) 100 MG capsule Take 1 capsule (100 mg total) by mouth every 12 (twelve) hours. Patient taking differently: Take 100 mg by mouth every 12 (twelve) hours.  04/03/14   Junius Creamer, NP  doxazosin (CARDURA) 8 MG tablet Take 8 mg by mouth at bedtime.  02/22/15   Historical Provider, MD  isosorbide-hydrALAZINE (  BIDIL) 20-37.5 MG tablet Take 0.5 tablets by mouth 2 (two) times daily.    Historical Provider, MD  levETIRAcetam (KEPPRA) 500 MG tablet Take 1 tablet (500 mg total) by mouth 2 (two) times daily. 04/14/15   Reyne Dumas, MD  levothyroxine (SYNTHROID, LEVOTHROID) 25 MCG tablet Take 25 mcg by mouth daily.  01/29/14   Historical Provider, MD  metoprolol (LOPRESSOR) 100 MG tablet Take 150 mg by mouth 2 (two) times daily.     Historical Provider, MD  multivitamin (RENA-VIT) TABS tablet Take 1 tablet by mouth at bedtime. 06/03/15   Mir Marry Guan, MD  OVER THE COUNTER MEDICATION Take 1 capsule by mouth 2 (two) times daily.  "Kidney Factor"    Historical Provider, MD  oxyCODONE-acetaminophen (PERCOCET/ROXICET) 5-325 MG per tablet Take 1 tablet by mouth every 6 (six) hours as needed. 10/18/14   Ulyses Amor, PA-C  pentafluoroprop-tetrafluoroeth (GEBAUERS) AERO Apply 1 application topically as needed (topical anesthesia for hemodialysis). 06/03/15   Mir Marry Guan, MD   Triage Vitals: BP 139/93 mmHg  Pulse 70  Temp(Src) 97.4 F (36.3 C) (Oral)  Resp 15  SpO2 99%   Physical Exam  Constitutional: He is oriented to person, place, and time. He appears well-developed and well-nourished.  HENT:  Head: Normocephalic and atraumatic.  Eyes: EOM are normal.  Neck: Normal range of motion.  Cardiovascular: Normal rate, regular rhythm, normal heart sounds and intact distal pulses.   Pulmonary/Chest: Effort normal and breath sounds normal. No respiratory distress.  Abdominal: Soft. He exhibits no distension. There is tenderness.  Mild mid epigastric tenderness  Musculoskeletal: Normal range of motion.  Neurological: He is alert and oriented to person, place, and time.  Skin: Skin is warm and dry.  Psychiatric: He has a normal mood and affect. Judgment normal.  Nursing note and vitals reviewed.   ED Course  Procedures (including critical care time)  DIAGNOSTIC STUDIES: Oxygen Saturation is 98% on RA, Normal by my interpretation.    COORDINATION OF CARE: 12:40 AM- Will order imaging and blood work. Will give Zofran, Morphine, and Gastrografin. Discussed treatment plan with pt at bedside and pt agreed to plan.     Labs Review Labs Reviewed  CBC - Abnormal; Notable for the following:    RBC 3.04 (*)    Hemoglobin 8.7 (*)    HCT 27.8 (*)    All other components within normal limits  COMPREHENSIVE METABOLIC PANEL - Abnormal; Notable for the following:    Chloride 95 (*)    Glucose, Bld 117 (*)    BUN 30 (*)    Creatinine, Ser 6.91 (*)    GFR calc non Af Amer 7 (*)    GFR calc Af Amer 8 (*)    Anion  gap 17 (*)    All other components within normal limits  LIPASE, BLOOD   HEMOGLOBIN  Date Value Ref Range Status  06/04/2015 8.7* 13.0 - 17.0 g/dL Final  06/03/2015 8.8* 13.0 - 17.0 g/dL Final  06/01/2015 8.3* 13.0 - 17.0 g/dL Final  05/31/2015 8.0* 13.0 - 17.0 g/dL Final      Imaging Review Ct Abdomen Pelvis Wo Contrast  06/04/2015  CLINICAL DATA:  Abdominal pain and bilateral flank pain. Nausea and vomiting. EXAM: CT ABDOMEN AND PELVIS WITHOUT CONTRAST TECHNIQUE: Multidetector CT imaging of the abdomen and pelvis was performed following the standard protocol without IV contrast. COMPARISON:  04/14/2015 FINDINGS: Small left pleural effusion. Atelectasis in both lung bases. This is slightly progressed since previous  study. There is extensive retrocrural and retroperitoneal lymphadenopathy extending into the iliac chains in the pelvis bilaterally. Lymphadenopathy also in the celiac axis. Since the previous study, there has been significant progression of retroperitoneal lymphadenopathy. This is likely to represent lymphoma with rapid growth. Polycystic kidneys bilaterally. No hydronephrosis. The unenhanced appearance of the liver, spleen, pancreas, and adrenal glands is unremarkable. Inferior vena cava and aorta are obscured to visualization by the retroperitoneal lymphadenopathy. Scattered aortic calcifications are visualized and there is no evidence of aortic aneurysm. Stomach, small bowel, and colon are not abnormally distended. 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 No free air or free fluid in the abdomen. Pelvis: Appendix is normal. Bladder is decompressed but the bladder wall is thickened, suggesting possible cystitis or bladder hypertrophy. Prostate gland is mildly enlarged and again there is asymmetric enlargement of the right seminal vesicle. No significant lymphadenopathy in the groin  regions. No destructive bone lesions. IMPRESSION: Extensive retrocrural, retroperitoneal, pelvic, and celiac axis lymphadenopathy demonstrating significant progression since recent previous study. 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 Polycystic renal disease. Prostate gland enlargement with asymmetry of the right seminal vesicle. Bladder wall thickening could be due to cystitis or hypertrophy due to outlet obstruction. These results will be called to the ordering clinician or representative by the Radiologist Assistant, and communication documented in the PACS or zVision Dashboard. Electronically Signed   By: Lucienne Capers M.D.   On: 06/04/2015 04:59   I have personally reviewed and evaluated these images and lab results as part of my medical decision-making.   EKG Interpretation None      MDM   Final diagnoses:  Nausea & vomiting  Abdominal pain  Retroperitoneal lymphadenopathy   5:29 AM Patient feels much better this time.  CT scan concerning for progressive worsening retroperitoneal lymphadenopathy concerning for lymphoma.  This was present on his CT scan in March 2017 but the patient reports he was never informed of this.  There appears to be extensive progression since then.  This could be the cause of his abdominal pain and nausea vomiting.  He feels better at this time.  I do not think he needs acute hospitalization at this time.    I have had a long discussion with the patient as well as the patient's family members regarding the results of his CT scan and the need for very close primary care and oncology follow-up.  He's been given contact information.  I have asked that he call the oncology team on Monday for close follow-up.  He understands he will need additional testing including likely biopsy.  I do not think he needs to be acutely hospitalized for it at this time however.    I've asked that if his symptoms  worsen he is to return the emergency department for further evaluation at that time he may require inpatient evaluation and treatment.  All questions were answered.  Patient and family understand the possible severity of this diagnosis and understand the importance of close follow-up.  I personally performed the services described in this documentation, which was scribed in my presence. The recorded information has been reviewed and is accurate.       Jola Schmidt, MD 06/04/15 [phone]

## 2015-06-05 ENCOUNTER — Telehealth: Payer: Self-pay | Admitting: Oncology

## 2015-06-05 NOTE — Telephone Encounter (Signed)
Pt called in after visit to ER and was asked to schedule appt with cancer center, spoke with pt regarding visit and printed out office notes and imaging for navigator to review.

## 2015-06-07 ENCOUNTER — Other Ambulatory Visit: Payer: Self-pay | Admitting: Hematology and Oncology

## 2015-06-07 ENCOUNTER — Telehealth: Payer: Self-pay | Admitting: Hematology and Oncology

## 2015-06-07 ENCOUNTER — Encounter: Payer: Self-pay | Admitting: Hematology and Oncology

## 2015-06-07 ENCOUNTER — Ambulatory Visit (HOSPITAL_BASED_OUTPATIENT_CLINIC_OR_DEPARTMENT_OTHER): Payer: PRIVATE HEALTH INSURANCE | Admitting: Hematology and Oncology

## 2015-06-07 VITALS — BP 114/75 | HR 81 | Temp 97.9°F | Resp 19 | Ht 71.0 in | Wt 204.3 lb

## 2015-06-07 DIAGNOSIS — C8338 Diffuse large B-cell lymphoma, lymph nodes of multiple sites: Secondary | ICD-10-CM | POA: Insufficient documentation

## 2015-06-07 DIAGNOSIS — T402X5A Adverse effect of other opioids, initial encounter: Secondary | ICD-10-CM

## 2015-06-07 DIAGNOSIS — R1033 Periumbilical pain: Secondary | ICD-10-CM | POA: Diagnosis not present

## 2015-06-07 DIAGNOSIS — D631 Anemia in chronic kidney disease: Secondary | ICD-10-CM

## 2015-06-07 DIAGNOSIS — N186 End stage renal disease: Secondary | ICD-10-CM

## 2015-06-07 DIAGNOSIS — R1900 Intra-abdominal and pelvic swelling, mass and lump, unspecified site: Secondary | ICD-10-CM | POA: Insufficient documentation

## 2015-06-07 DIAGNOSIS — G8194 Hemiplegia, unspecified affecting left nondominant side: Secondary | ICD-10-CM

## 2015-06-07 DIAGNOSIS — Z992 Dependence on renal dialysis: Secondary | ICD-10-CM

## 2015-06-07 DIAGNOSIS — Q613 Polycystic kidney, unspecified: Secondary | ICD-10-CM

## 2015-06-07 DIAGNOSIS — N189 Chronic kidney disease, unspecified: Secondary | ICD-10-CM

## 2015-06-07 DIAGNOSIS — Q612 Polycystic kidney, adult type: Secondary | ICD-10-CM | POA: Insufficient documentation

## 2015-06-07 DIAGNOSIS — I639 Cerebral infarction, unspecified: Secondary | ICD-10-CM

## 2015-06-07 DIAGNOSIS — K5903 Drug induced constipation: Secondary | ICD-10-CM

## 2015-06-07 DIAGNOSIS — I69351 Hemiplegia and hemiparesis following cerebral infarction affecting right dominant side: Secondary | ICD-10-CM | POA: Insufficient documentation

## 2015-06-07 DIAGNOSIS — I10 Essential (primary) hypertension: Secondary | ICD-10-CM

## 2015-06-07 DIAGNOSIS — C8593 Non-Hodgkin lymphoma, unspecified, intra-abdominal lymph nodes: Secondary | ICD-10-CM

## 2015-06-07 DIAGNOSIS — R110 Nausea: Secondary | ICD-10-CM

## 2015-06-07 DIAGNOSIS — R109 Unspecified abdominal pain: Secondary | ICD-10-CM | POA: Insufficient documentation

## 2015-06-07 DIAGNOSIS — D649 Anemia, unspecified: Secondary | ICD-10-CM

## 2015-06-07 DIAGNOSIS — C8513 Unspecified B-cell lymphoma, intra-abdominal lymph nodes: Secondary | ICD-10-CM

## 2015-06-07 HISTORY — DX: Non-hodgkin lymphoma, unspecified, intra-abdominal lymph nodes: C85.93

## 2015-06-07 HISTORY — DX: Intra-abdominal and pelvic swelling, mass and lump, unspecified site: R19.00

## 2015-06-07 HISTORY — DX: Diffuse large b-cell lymphoma, lymph nodes of multiple sites: C83.38

## 2015-06-07 NOTE — Telephone Encounter (Signed)
Gave pt apt & avs °

## 2015-06-07 NOTE — Assessment & Plan Note (Signed)
The patient is currently receiving hemodialysis. The cause of his renal failure is due to polycystic kidney disease. I will try to work around his dialysis schedule by ordering CT scan on Monday, CT-guided biopsy on Wednesday and see him on Friday next week

## 2015-06-07 NOTE — Assessment & Plan Note (Signed)
Both kidneys have polycystic appearance. It is unlikely cancer of the kidneys

## 2015-06-07 NOTE — Assessment & Plan Note (Signed)
The appearance of the retroperitoneal lymphadenopathy is highly suspicious for high-grade lymphoma. I will order urgent CT scan without contrast next week to complete staging. If there are no other assessable lymph nodes on CT scan of the chest, I will consult interventional radiologist to obtain CT-guided biopsy of the retroperitoneal mass I will review his information at the next hematology tumor board on 06/13/2015. I did not go into great details about prognosis and treatment options pending further information from biopsy. I will see him back next week to review test results

## 2015-06-07 NOTE — Assessment & Plan Note (Signed)
He has chronic, intermittent pain related to the intra-abdominal process. I recommend him to continue pain medicine as needed for pain control pending further evaluation as above

## 2015-06-07 NOTE — Progress Notes (Signed)
[place] NOTE  Patient Care Team: [name], MD as PCP - General (Internal Medicine) [name], MD as Consulting Physician (Nephrology) Heath Lark, MD as Consulting Physician (Hematology and Oncology)  CHIEF COMPLAINTS/PURPOSE OF CONSULTATION:  Abdominal masses, suspicious for lymphoma  HISTORY OF PRESENTING ILLNESS:  Javier Jenkins 65 y.o. male is here because of abnormal imaging study suspicious for lymphoma. He is here today accompanied by his wife and daughter. The patient has background history of polycystic kidney disease and recently was started on hemodialysis. He received hemodialysis on Tuesdays, Thursdays and Saturdays through AV fistula on the left arm. The patient had history of stroke with residual right-sided hemiparesis. Several months ago, he had accidental injury and fall causing subdural hematoma requiring evacuation. The patient started to feel unwell around March 2017 with nausea, anorexia and intermittent abdominal pain with associated changes in bowel habits. The patient never had colonoscopy in the past. CT imaging study in March 2017 showed retroperitoneal lymphadenopathy suspicious for lymphoma. He was never referred to the cancer center at that time. Since then, he has progressive, frequent intermittent abdomen of pain that is centered around the periumbilical region. With each attack, he rated the pain a 7 out of 10 pain and lasted for several hours. He identified food will cause worsening pain radiating to his back, relieved by pain medicine or sitting upright. Along with the pain, he has associated nausea and constipation. He was prescribed Zofran for nausea which seems to help control his nausea. He had severe constipation recently he attributed to poor oral intake. His last bowel movement was a week ago and he does not take laxatives as directed. He denies night sweats or palpable lymphadenopathy elsewhere. He presented  to the emergency department on 06/04/2015 and repeat imaging study showed significant disease progression after lymph no suspicious for high-grade lymphoma. He was subsequently referred here for further management.  MEDICAL HISTORY:  Past Medical History  Diagnosis Date  . Hypertension   . Stroke [facility])     right side paralysis  . Hypothyroidism   . Diabetes mellitus without complication ([place])     not on medications  . Chronic kidney disease     stage 5  . Constipation   . 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 Non-Hodgkin lymphoma of intra-abdominal lymph nodes ([place]) 06/07/2015    SURGICAL HISTORY: Past Surgical History  Procedure Laterality Date  . Hemorrhoid surgery    . Av fistula placement Left 10/18/2014    Procedure: ARTERIOVENOUS (AV) FISTULA CREATION;  Surgeon: Angelia Mould, MD;  Location: [place];  Service: Vascular;  Laterality: Left;  . Burr hole Right 03/17/2015    Procedure: BURR HOLES FOR RIGHT SUBDURAL HEMATOMA;  Surgeon: Leeroy Cha, MD;  Location: [place] [facility];  Service: Neurosurgery;  Laterality: Right;    SOCIAL HISTORY: Social History   Social History  . Marital Status: Married    Spouse Name: N/A  . Number of Children: N/A  . Years of Education: N/A   Occupational History  . Not on file.   Social History Main Topics  . Smoking status: Former Smoker    Types: Pipe    Quit date: 09/22/1984  . Smokeless tobacco: Never Used  . Alcohol Use: No  . Drug Use: No  . 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 Not on file   Social History Narrative  FAMILY HISTORY: Family History  Problem Relation Age of Onset  . Hypertension Mother   . Cancer Paternal Uncle     prostate ca    ALLERGIES:  has No Known Allergies.  MEDICATIONS:  Current Outpatient Prescriptions  Medication Sig Dispense Refill  . aspirin 325 MG tablet Take 1 tablet (325 mg total) by mouth daily. 30 tablet  0  . calcium acetate (PHOSLO) 667 MG capsule Take 1 capsule by mouth 2 (two) times daily.   11  . cloNIDine (CATAPRES) 0.2 MG tablet Take 0.4 mg by mouth 2 (two) times daily.     Marland Kitchen docusate sodium (COLACE) 100 MG capsule Take 1 capsule (100 mg total) by mouth every 12 (twelve) hours. (Patient taking differently: Take 100 mg by mouth every 12 (twelve) hours. ) 60 capsule 0  . doxazosin (CARDURA) 8 MG tablet Take 8 mg by mouth at bedtime.   6  . isosorbide-hydrALAZINE (BIDIL) 20-37.5 MG tablet Take 0.5 tablets by mouth 2 (two) times daily.    Marland Kitchen levETIRAcetam (KEPPRA) 500 MG tablet Take 1 tablet (500 mg total) by mouth 2 (two) times daily. 60 tablet 1  . levothyroxine (SYNTHROID, LEVOTHROID) 25 MCG tablet Take 25 mcg by mouth daily.   0  . metoprolol (LOPRESSOR) 100 MG tablet Take 150 mg by mouth 2 (two) times daily.     . multivitamin (RENA-VIT) TABS tablet Take 1 tablet by mouth at bedtime. 90 tablet 0  . ondansetron (ZOFRAN ODT) 8 MG disintegrating tablet Take 1 tablet (8 mg total) by mouth every 8 (eight) hours as needed for nausea or vomiting. 12 tablet 0  . OVER THE COUNTER MEDICATION Take 1 capsule by mouth 2 (two) times daily. "Kidney Factor"    . oxyCODONE-acetaminophen (PERCOCET/ROXICET) 5-325 MG tablet Take 1 tablet by mouth every 4 (four) hours as needed. 25 tablet 0  . pentafluoroprop-tetrafluoroeth (GEBAUERS) AERO Apply 1 application topically as needed (topical anesthesia for hemodialysis). 30 mL 0   No current facility-administered medications for this visit.    REVIEW OF SYSTEMS:   Constitutional: Denies fevers, chills or abnormal night sweats Eyes: Denies blurriness of vision, double vision or watery eyes Ears, nose, mouth, throat, and face: Denies mucositis or sore throat Respiratory: Denies cough, dyspnea or wheezes Cardiovascular: Denies palpitation, chest discomfort or lower extremity swelling Skin: Denies abnormal skin rashes Lymphatics: Denies new lymphadenopathy or easy  bruising Neurological:Denies numbness, tingling or new weaknesses Behavioral/Psych: Mood is stable, no new changes  All other systems were reviewed with the patient and are negative.  PHYSICAL EXAMINATION: ECOG PERFORMANCE STATUS: 1 - Symptomatic but completely ambulatory  Filed Vitals:   06/07/15 1210  BP: 114/75  Pulse: 81  Temp: 97.9 F (36.6 C)  Resp: 19   Filed Weights   06/07/15 1210  Weight: 204 lb 4.8 oz (92.67 kg)    GENERAL:alert, no distress and comfortable. He looks older than stated age SKIN: skin color, texture, turgor are normal, no rashes or significant lesions EYES: normal, conjunctiva are pink and non-injected, sclera clear OROPHARYNX:no exudate, no erythema and lips, buccal mucosa, and tongue normal  NECK: supple, thyroid normal size, non-tender, without nodularity LYMPH:  no palpable lymphadenopathy in the cervical, axillary or inguinal LUNGS: clear to auscultation and percussion with normal breathing effort HEART: regular rate & rhythm and no murmurs and no lower extremity edema ABDOMEN:abdomen soft, non-tender and normal bowel sounds. Palpable abdominal fullness Musculoskeletal:no cyanosis of digits and no clubbing  PSYCH: alert & oriented x 3 with  fluent speech NEURO: he had received show right-sided neurological deficit  LABORATORY DATA:  I have reviewed the data as listed Lab Results  Component Value Date   WBC 8.2 06/04/2015   HGB 8.7* 06/04/2015   HCT 27.8* 06/04/2015   MCV 91.4 06/04/2015   PLT 251 06/04/2015    Recent Labs  04/14/15 0630 05/31/15 1159  06/02/15 0918 06/03/15 0555 06/04/15 0117  NA 138 137  < > 139 137 136  K 2.9* 3.5  < > 3.5 4.1 3.9  CL 103 103  < > 99* 96* 95*  CO2 24 20*  < > 26 25 24   GLUCOSE 103* 138*  < > 143* 98 117*  BUN 74* 70*  < > 32* 22* 30*  CREATININE 6.27* 9.18*  < > 5.99* 5.38* 6.91*  CALCIUM 8.6* 8.9  < > 9.8 9.6 9.8  GFRNONAA 8* 5*  < > 9* 10* 7*  GFRAA 10* 6*  < > 10* 11* 8*  PROT 6.3* 6.9   --   --   --  8.1  ALBUMIN 2.6* 3.5  --   --   --  4.1  AST 17 21  --   --   --  33  ALT 21 16*  --   --   --  18  ALKPHOS 38 40  --   --   --  46  BILITOT 0.6 0.5  --   --   --  0.4  < > = values in this interval not displayed.  RADIOGRAPHIC STUDIES:I reviewed imaging study with the patient and family I have personally reviewed the radiological images as listed and agreed with the findings in the report. Ct Abdomen Pelvis Wo Contrast  06/04/2015  CLINICAL DATA:  Abdominal pain and bilateral flank pain. Nausea and vomiting. EXAM: CT ABDOMEN AND PELVIS WITHOUT CONTRAST TECHNIQUE: Multidetector CT imaging of the abdomen and pelvis was performed following the standard protocol without IV contrast. COMPARISON:  04/14/2015 FINDINGS: Small left pleural effusion. Atelectasis in both lung bases. This is slightly progressed since previous study. There is extensive retrocrural and retroperitoneal lymphadenopathy extending into the iliac chains in the pelvis bilaterally. Lymphadenopathy also in the celiac axis. Since the previous study, there has been significant progression of retroperitoneal lymphadenopathy. This is likely to represent lymphoma with rapid growth. Polycystic kidneys bilaterally. No hydronephrosis. The unenhanced appearance of the liver, spleen, pancreas, and adrenal glands is unremarkable. Inferior vena cava and aorta are obscured to visualization by the retroperitoneal lymphadenopathy. Scattered aortic calcifications are visualized and there is no evidence of aortic aneurysm. Stomach, small bowel, and colon are not abnormally distended. 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 No free air or free fluid in the abdomen. Pelvis: Appendix is normal. Bladder is decompressed but the bladder wall is thickened, suggesting possible cystitis or bladder hypertrophy. Prostate gland is mildly enlarged and  again there is asymmetric enlargement of the right seminal vesicle. No significant lymphadenopathy in the groin regions. No destructive bone lesions. IMPRESSION: Extensive retrocrural, retroperitoneal, pelvic, and celiac axis lymphadenopathy demonstrating significant progression since recent previous study. 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 Polycystic renal disease. Prostate gland enlargement with asymmetry of the right seminal vesicle. Bladder wall thickening could be due to cystitis or hypertrophy due to outlet obstruction. These results will be called to the ordering clinician or representative by the Radiologist Assistant,  and communication documented in the PACS or zVision Dashboard. Electronically Signed   By: Lucienne Capers M.D.   On: 06/04/2015 04:59   Dg Chest Port 1 View  05/31/2015  CLINICAL DATA:  Abdominal pain.  Nausea and vomiting. EXAM: PORTABLE CHEST 1 VIEW COMPARISON:  The 07/19/2014; 01/15/2013 FINDINGS: Grossly unchanged enlarged cardiac silhouette and mediastinal contours given slightly reduced lung volumes. Atherosclerotic plaque within a mildly tortuous thoracic aorta. Minimal bibasilar heterogeneous opacities, favored to represent atelectasis. Mild pulmonary venous congestion left frank evidence of edema. No pleural effusion or pneumothorax. No acute osseus abnormalities. IMPRESSION: Cardiomegaly and bibasilar atelectasis without acute cardiopulmonary disease on this AP portable examination. Further evaluation with a PA and lateral chest radiograph may be obtained as clinically indicated. Electronically Signed   By: Sandi Mariscal M.D.   On: 05/31/2015 16:36    ASSESSMENT & PLAN:  Non-Hodgkin lymphoma of intra-abdominal lymph nodes (HCC) The appearance of the retroperitoneal lymphadenopathy is highly suspicious for high-grade lymphoma. I will order urgent CT scan without contrast next week to complete staging. If  there are no other assessable lymph nodes on CT scan of the chest, I will consult interventional radiologist to obtain CT-guided biopsy of the retroperitoneal mass I will review his information at the next hematology tumor board on 06/13/2015. I did not go into great details about prognosis and treatment options pending further information from biopsy. I will see him back next week to review test results  End stage renal failure on dialysis [facility]) The patient is currently receiving hemodialysis. The cause of his renal failure is due to polycystic kidney disease. I will try to work around his dialysis schedule by ordering CT scan on Monday, CT-guided biopsy on Wednesday and see him on Friday next week  Anemia in chronic kidney disease This is likely anemia of chronic disease. The patient denies recent history of bleeding such as epistaxis, hematuria or hematochezia. He is asymptomatic from the anemia. We will observe for now.  He does not require transfusion now.  I would defer ESA management to nephrologist.  Essential hypertension he will continue current medical management. I recommend close follow-up with primary care doctor & nephrologist for medication adjustment.   Constipation due to opioid therapy I had extensive discussion with the patient and family members regarding dietary modification and laxative regimen for constipation  Chronic nausea The nausea is related to constipation. He will continue Zofran as needed  Abdominal pain He has chronic, intermittent pain related to the intra-abdominal process. I recommend him to continue pain medicine as needed for pain control pending further evaluation as above  Hemiparesis affecting right side as late effect of stroke [facility]) The patient had history of fall 3 months ago with subdural hematoma. Since then, he is able to manage at home without further falls.  Adult polycystic kidney disease Both kidneys have polycystic appearance. It  is unlikely cancer of the kidneys   Retroperitoneal mass The appearance of the abdominal masses are compatible with lymphoma. I will order a CT scan of the chest to see if there are other assessable lymph nodes for biopsy. If not, I will get interventional radiologist to do CT-guided biopsy of retroperitoneal mass     All questions were answered. The patient knows to call the clinic with any problems, questions or concerns. I spent 55 minutes counseling the patient face to face. The total time spent in the appointment was 60 minutes and more than 50% was on counseling.  [facility], [place], MD 06/07/2015 2:57 PM

## 2015-06-07 NOTE — Assessment & Plan Note (Signed)
I had extensive discussion with the patient and family members regarding dietary modification and laxative regimen for constipation

## 2015-06-07 NOTE — Assessment & Plan Note (Signed)
he will continue current medical management. I recommend close follow-up with primary care doctor & nephrologist for medication adjustment.

## 2015-06-07 NOTE — Assessment & Plan Note (Signed)
The patient had history of fall 3 months ago with subdural hematoma. Since then, he is able to manage at home without further falls.

## 2015-06-07 NOTE — Assessment & Plan Note (Signed)
The appearance of the abdominal masses are compatible with lymphoma. I will order a CT scan of the chest to see if there are other assessable lymph nodes for biopsy. If not, I will get interventional radiologist to do CT-guided biopsy of retroperitoneal mass

## 2015-06-07 NOTE — Assessment & Plan Note (Signed)
The nausea is related to constipation. He will continue Zofran as needed

## 2015-06-07 NOTE — Assessment & Plan Note (Signed)
This is likely anemia of chronic disease. The patient denies recent history of bleeding such as epistaxis, hematuria or hematochezia. He is asymptomatic from the anemia. We will observe for now.  He does not require transfusion now.  I would defer ESA management to nephrologist. 

## 2015-06-12 ENCOUNTER — Ambulatory Visit (HOSPITAL_COMMUNITY)
Admission: RE | Admit: 2015-06-12 | Discharge: 2015-06-12 | Disposition: A | Discharge status: Home / Self Care | Payer: PRIVATE HEALTH INSURANCE | Source: Ambulatory Visit | Attending: Hematology and Oncology | Admitting: Hematology and Oncology

## 2015-06-12 ENCOUNTER — Encounter (HOSPITAL_COMMUNITY): Payer: Self-pay

## 2015-06-12 DIAGNOSIS — R590 Localized enlarged lymph nodes: Secondary | ICD-10-CM | POA: Diagnosis not present

## 2015-06-12 DIAGNOSIS — R1900 Intra-abdominal and pelvic swelling, mass and lump, unspecified site: Secondary | ICD-10-CM | POA: Diagnosis present

## 2015-06-12 DIAGNOSIS — C8513 Unspecified B-cell lymphoma, intra-abdominal lymph nodes: Secondary | ICD-10-CM

## 2015-06-12 DIAGNOSIS — J90 Pleural effusion, not elsewhere classified: Secondary | ICD-10-CM | POA: Insufficient documentation

## 2015-06-13 ENCOUNTER — Other Ambulatory Visit: Payer: Self-pay | Admitting: Radiology

## 2015-06-14 ENCOUNTER — Encounter (HOSPITAL_COMMUNITY): Payer: Self-pay

## 2015-06-14 ENCOUNTER — Ambulatory Visit (HOSPITAL_COMMUNITY)
Admission: RE | Admit: 2015-06-14 | Discharge: 2015-06-14 | Disposition: A | Discharge status: Home / Self Care | Payer: PRIVATE HEALTH INSURANCE | Source: Ambulatory Visit | Attending: Hematology and Oncology | Admitting: Hematology and Oncology

## 2015-06-14 DIAGNOSIS — R109 Unspecified abdominal pain: Secondary | ICD-10-CM | POA: Insufficient documentation

## 2015-06-14 DIAGNOSIS — Z87891 Personal history of nicotine dependence: Secondary | ICD-10-CM | POA: Diagnosis not present

## 2015-06-14 DIAGNOSIS — Z7982 Long term (current) use of aspirin: Secondary | ICD-10-CM | POA: Diagnosis not present

## 2015-06-14 DIAGNOSIS — Q613 Polycystic kidney, unspecified: Secondary | ICD-10-CM | POA: Diagnosis not present

## 2015-06-14 DIAGNOSIS — I120 Hypertensive chronic kidney disease with stage 5 chronic kidney disease or end stage renal disease: Secondary | ICD-10-CM | POA: Insufficient documentation

## 2015-06-14 DIAGNOSIS — R590 Localized enlarged lymph nodes: Secondary | ICD-10-CM | POA: Diagnosis present

## 2015-06-14 DIAGNOSIS — C8333 Diffuse large B-cell lymphoma, intra-abdominal lymph nodes: Secondary | ICD-10-CM | POA: Diagnosis not present

## 2015-06-14 DIAGNOSIS — Z8673 Personal history of transient ischemic attack (TIA), and cerebral infarction without residual deficits: Secondary | ICD-10-CM | POA: Diagnosis not present

## 2015-06-14 DIAGNOSIS — N185 Chronic kidney disease, stage 5: Secondary | ICD-10-CM | POA: Diagnosis not present

## 2015-06-14 DIAGNOSIS — R112 Nausea with vomiting, unspecified: Secondary | ICD-10-CM | POA: Insufficient documentation

## 2015-06-14 DIAGNOSIS — E039 Hypothyroidism, unspecified: Secondary | ICD-10-CM | POA: Insufficient documentation

## 2015-06-14 DIAGNOSIS — Z79899 Other long term (current) drug therapy: Secondary | ICD-10-CM | POA: Insufficient documentation

## 2015-06-14 DIAGNOSIS — E1122 Type 2 diabetes mellitus with diabetic chronic kidney disease: Secondary | ICD-10-CM | POA: Diagnosis not present

## 2015-06-14 DIAGNOSIS — R1900 Intra-abdominal and pelvic swelling, mass and lump, unspecified site: Secondary | ICD-10-CM

## 2015-06-14 LAB — CBC
HCT: 26.8 % — ABNORMAL LOW (ref 39.0–52.0)
Hemoglobin: 8.4 g/dL — ABNORMAL LOW (ref 13.0–17.0)
MCH: 28.7 pg (ref 26.0–34.0)
MCHC: 31.3 g/dL (ref 30.0–36.0)
MCV: 91.5 fL (ref 78.0–100.0)
Platelets: 356 10*3/uL (ref 150–400)
RBC: 2.93 MIL/uL — ABNORMAL LOW (ref 4.22–5.81)
RDW: 15.5 % (ref 11.5–15.5)
WBC: 8.4 10*3/uL (ref 4.0–10.5)

## 2015-06-14 LAB — GLUCOSE, CAPILLARY
GLUCOSE-CAPILLARY: 101 mg/dL — AB (ref 65–99)
Glucose-Capillary: 119 mg/dL — ABNORMAL HIGH (ref 65–99)

## 2015-06-14 LAB — PROTIME-INR
INR: 1.02 (ref 0.00–1.49)
Prothrombin Time: 13.6 seconds (ref 11.6–15.2)

## 2015-06-14 LAB — APTT: APTT: 23 s — AB (ref 24–37)

## 2015-06-14 MED ORDER — LIDOCAINE HCL 1 % IJ SOLN
INTRAMUSCULAR | Status: AC
  Filled 2015-06-14: qty 20

## 2015-06-14 MED ORDER — HYDROCODONE-ACETAMINOPHEN 5-325 MG PO TABS
ORAL_TABLET | ORAL | Status: AC
  Filled 2015-06-14: qty 2

## 2015-06-14 MED ORDER — MIDAZOLAM HCL 2 MG/2ML IJ SOLN
INTRAMUSCULAR | Status: AC | PRN
  Administered 2015-06-14: 0.5 mg via INTRAVENOUS

## 2015-06-14 MED ORDER — OXYCODONE-ACETAMINOPHEN 5-325 MG PO TABS
ORAL_TABLET | ORAL | Status: AC
  Filled 2015-06-14: qty 1

## 2015-06-14 MED ORDER — FENTANYL CITRATE (PF) 100 MCG/2ML IJ SOLN
INTRAMUSCULAR | Status: AC | PRN
  Administered 2015-06-14: 25 ug via INTRAVENOUS

## 2015-06-14 MED ORDER — FENTANYL CITRATE (PF) 100 MCG/2ML IJ SOLN
INTRAMUSCULAR | Status: AC
  Filled 2015-06-14: qty 2

## 2015-06-14 MED ORDER — SODIUM CHLORIDE 0.9 % IV SOLN: Freq: Once | INTRAVENOUS | Status: DC

## 2015-06-14 MED ORDER — MIDAZOLAM HCL 2 MG/2ML IJ SOLN
INTRAMUSCULAR | Status: AC
  Filled 2015-06-14: qty 2

## 2015-06-14 MED ORDER — OXYCODONE-ACETAMINOPHEN 5-325 MG PO TABS
1.0000 | ORAL_TABLET | ORAL | Status: AC
  Administered 2015-06-14: 1 via ORAL
  Filled 2015-06-14: qty 1

## 2015-06-14 MED ORDER — HYDROCODONE-ACETAMINOPHEN 5-325 MG PO TABS
1.0000 | ORAL_TABLET | ORAL | Status: DC | PRN
  Administered 2015-06-14: 2 via ORAL
  Filled 2015-06-14 (×2): qty 2

## 2015-06-14 NOTE — Sedation Documentation (Signed)
Patient is resting comfortably. 

## 2015-06-14 NOTE — Sedation Documentation (Signed)
Pt in room for exam, vitals stable.

## 2015-06-14 NOTE — Sedation Documentation (Signed)
MD aware of pt pain and vital signs.

## 2015-06-14 NOTE — Progress Notes (Addendum)
    Dr Jarvis Newcomer aware of elevated bp// no orders followed per radiology

## 2015-06-14 NOTE — Sedation Documentation (Signed)
Awaiting MD to see pt.  Wife at side, call bell with in reach.  Procedure explained, questions answered.

## 2015-06-14 NOTE — Sedation Documentation (Signed)
Vital signs stable. No complaints at this time. 

## 2015-06-14 NOTE — Discharge Instructions (Signed)
Needle Biopsy, Care After °These instructions give you information about caring for yourself after your procedure. Your doctor may also give you more specific instructions. Call your doctor if you have any problems or questions after your procedure. °HOME CARE °· Rest as told by your doctor. °· Take medicines only as told by your doctor. °· There are many different ways to close and cover the biopsy site, including stitches (sutures), skin glue, and adhesive strips. Follow instructions from your doctor about: °¨ How to take care of your biopsy site. °¨ When and how you should change your bandage (dressing). °¨ When you should remove your dressing. °¨ Removing whatever was used to close your biopsy site. °· Check your biopsy site every day for signs of infection. Watch for: °¨ Redness, swelling, or pain. °¨ Fluid, blood, or pus. °GET HELP IF: °· You have a fever. °· You have redness, swelling, or pain at the biopsy site, and it lasts longer than a few days. °· You have fluid, blood, or pus coming from the biopsy site. °· You feel sick to your stomach (nauseous). °· You throw up (vomit). °GET HELP RIGHT AWAY IF: °· You are short of breath. °· You have trouble breathing. °· Your chest hurts. °· You feel dizzy or you pass out (faint). °· You have bleeding that does not stop with pressure or a bandage. °· You cough up blood. °· Your belly (abdomen) hurts. °  °This information is not intended to replace advice given to you by your health care provider. Make sure you discuss any questions you have with your health care provider. °  °Document Released: 12/28/2007 Document Revised: 05/31/2014 Document Reviewed: 01/10/2014 °Elsevier Interactive Patient Education ©2016 [employer] ° °

## 2015-06-14 NOTE — Procedures (Signed)
CT core left retroperit LAN 18g x4 in saline to surg path No complication No blood loss. See complete dictation in [facility].

## 2015-06-14 NOTE — Sedation Documentation (Signed)
Patient is resting comfortably. Vitals stable, md aware of elevated BP.

## 2015-06-14 NOTE — Sedation Documentation (Signed)
Pt states that he is in pain, repositioned pt on right side per pt request. Pt states he is hurting where incision is in left mid back. Pt states pressure is relieved when he is turned on side. Pt rates pain 7/10 on 1-10 scale.

## 2015-06-14 NOTE — H&P (Signed)
Chief Complaint: Patient was seen in consultation today for retroperitoneal mass biopsy at the request of Annetta  Referring Physician(s): Heath Lark  Supervising Physician: Arne Cleveland  Patient Status: Out-pt  History of Present Illness: Javier Jenkins is a 65 y.o. male   ESRD Polycystic kidneys CVA Subdural hematoma -- evacuation 03/17/15  Pt has had bouts of nausea and vomiting and intermittent abd pain since March 2017 04/14/15 CT: IMPRESSION: Enlarged kidneys bilaterally secondary to chronic polycystic kidney disease.  Lower mediastinal, diffuse retroperitoneal and right pelvic internal iliac adenopathy, suspect indolent lymphoproliferative process such as lymphoma.  No further evaluation at that time.  Continued N/V and abd pain Presented to ED 06/04/15 CT: IMPRESSION: Extensive retrocrural, retroperitoneal, pelvic, and celiac axis lymphadenopathy demonstrating significant progression since recent previous study. 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 Polycystic renal disease. Prostate gland enlargement with asymmetry of the right seminal vesicle. Bladder wall thickening could be due to cystitis or hypertrophy due to outlet obstruction. Referred then to Dr Alvy Bimler Now scheduled for biopsy of Retroperitoneal mass  Suspicious for lymphoma  Past Medical History  Diagnosis Date  . Hypertension   . Stroke [facility])     right side paralysis  . Hypothyroidism   . Diabetes mellitus without complication ([place])     not on medications  . Chronic kidney disease     stage 5  . Constipation   . 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 Non-Hodgkin lymphoma of intra-abdominal lymph nodes ([place]) 06/07/2015    Past Surgical History  Procedure Laterality Date  . Hemorrhoid surgery    . Av fistula placement Left 10/18/2014    Procedure: ARTERIOVENOUS (AV) FISTULA CREATION;  Surgeon: Angelia Mould, MD;  Location: [facility];  Service: Vascular;  Laterality: Left;  . Burr hole Right 03/17/2015    Procedure: BURR HOLES FOR RIGHT SUBDURAL HEMATOMA;  Surgeon: Leeroy Cha, MD;  Location: [place] NEUR[facility] ORS;  Service: Neurosurgery;  Laterality: Right;    Allergies: Review of patient's allergies indicates no known allergies.  Medications: Prior to Admission medications   Medication Sig Start Date End Date Taking? Authorizing Provider  aspirin 325 MG tablet Take 1 tablet (325 mg total) by mouth daily. 04/21/15  Yes Reyne Dumas, MD  calcium acetate (PHOSLO) 667 MG capsule Take 1 capsule by mouth 2 (two) times daily.  04/06/15  Yes Historical Provider, MD  cloNIDine (CATAPRES) 0.2 MG tablet Take 0.4 mg by mouth 2 (two) times daily.    Yes Historical Provider, MD  docusate sodium (COLACE) 100 MG capsule Take 1 capsule (100 mg total) by mouth every 12 (twelve) hours. Patient taking differently: Take 100 mg by mouth every 12 (twelve) hours.  04/03/14  Yes Junius Creamer, NP  doxazosin (CARDURA) 8 MG tablet Take 8 mg by mouth at bedtime.  02/22/15  Yes Historical Provider, MD  isosorbide-hydrALAZINE (BIDIL) 20-37.5 MG tablet Take 0.5 tablets by mouth 2 (two) times daily.   Yes Historical Provider, MD  levETIRAcetam (KEPPRA) 500 MG tablet Take 1 tablet (500 mg total) by mouth 2 (two) times daily. 04/14/15  Yes Reyne Dumas, MD  levothyroxine (SYNTHROID, LEVOTHROID) 25 MCG tablet Take 25 mcg by mouth daily.  01/29/14  Yes Historical Provider, MD  metoprolol (LOPRESSOR) 100 MG tablet Take 150 mg by mouth 2 (two) times daily.    Yes Historical Provider, MD  multivitamin (RENA-VIT) TABS tablet Take 1 tablet by mouth at bedtime. 06/03/15  Yes Mir Marry Guan, MD  OVER THE COUNTER MEDICATION Take 1 capsule by mouth 2 (two) times daily. "Kidney Factor"   Yes Historical Provider, MD  oxyCODONE-acetaminophen (PERCOCET/ROXICET) 5-325 MG tablet Take 1 tablet by mouth every 4 (four) hours as needed. 06/04/15  Yes Jola Schmidt, MD  pentafluoroprop-tetrafluoroeth Landry Dyke) AERO Apply 1 application topically as needed (topical anesthesia for hemodialysis). 06/03/15  Yes Mir Marry Guan, MD  ondansetron (ZOFRAN ODT) 8 MG disintegrating tablet Take 1 tablet (8 mg total) by mouth every 8 (eight) hours as needed for nausea or vomiting. 06/04/15   Jola Schmidt, MD     Family History  Problem Relation Age of Onset  . Hypertension Mother   . Cancer Paternal Uncle     prostate ca    Social History   Social History  . Marital Status: Married    Spouse Name: N/A  . Number of Children: N/A  . Years of Education: N/A   Social History Main Topics  . Smoking status: Former Smoker    Types: Pipe    Quit date: 09/22/1984  . Smokeless tobacco: Never Used  . Alcohol Use: No  . Drug Use: No  . 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 None   Social History Narrative     Review of Systems: A 12 point ROS discussed and pertinent positives are indicated in the HPI above.  All other systems are negative.  Review of Systems  Constitutional: Positive for appetite change and fatigue. Negative for activity change and unexpected weight change.  Gastrointestinal: Positive for nausea, vomiting and abdominal pain.  Neurological: Positive for weakness.  Psychiatric/Behavioral: Negative for behavioral problems and confusion.    Vital Signs: BP 117/72 mmHg  Pulse 114  Temp(Src) 98.1 F (36.7 C)  Resp 18  Ht 5\' 11"  (1.803 m)  Wt 204 lb (92.534 kg)  BMI 28.46 kg/m2  SpO2 99%  Physical Exam  Constitutional: He is oriented to person, place, and time.  Cardiovascular: Normal rate, regular rhythm and normal heart sounds.   Pulmonary/Chest: Effort normal and breath sounds normal. He has no wheezes.  Abdominal: Soft. There is tenderness.  Musculoskeletal: Normal range of motion.  Rt side facial droop Rt side extremity weakness  Neurological:  He is alert and oriented to person, place, and time.  Skin: Skin is warm and dry.  Psychiatric: He has a normal mood and affect. His behavior is normal. Judgment and thought content normal.  Nursing note and vitals reviewed.   Mallampati Score:  MD Evaluation Airway: WNL Heart: WNL Abdomen: WNL Chest/ Lungs: WNL ASA  Classification: 3 Mallampati/Airway Score: Two  Imaging: Ct Abdomen Pelvis Wo Contrast  06/04/2015  CLINICAL DATA:  Abdominal pain and bilateral flank pain. Nausea and vomiting. EXAM: CT ABDOMEN AND PELVIS WITHOUT CONTRAST TECHNIQUE: Multidetector CT imaging of the abdomen and pelvis was performed following the standard protocol without IV contrast. COMPARISON:  04/14/2015 FINDINGS: Small left pleural effusion. Atelectasis in both lung bases. This is slightly progressed since previous study. There is extensive retrocrural and retroperitoneal lymphadenopathy extending into the iliac chains in the pelvis bilaterally. Lymphadenopathy also in the celiac axis. Since the previous study, there has been significant progression of retroperitoneal lymphadenopathy. This is likely to represent lymphoma with rapid growth. Polycystic kidneys bilaterally. No hydronephrosis. The unenhanced appearance of the liver, spleen, pancreas, and adrenal glands is unremarkable. Inferior vena cava and aorta are obscured to visualization by the retroperitoneal  lymphadenopathy. Scattered aortic calcifications are visualized and there is no evidence of aortic aneurysm. Stomach, small bowel, and colon are not abnormally distended. 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 No free air or free fluid in the abdomen. Pelvis: Appendix is normal. Bladder is decompressed but the bladder wall is thickened, suggesting possible cystitis or bladder hypertrophy. Prostate gland is mildly enlarged and again there is asymmetric  enlargement of the right seminal vesicle. No significant lymphadenopathy in the groin regions. No destructive bone lesions. IMPRESSION: Extensive retrocrural, retroperitoneal, pelvic, and celiac axis lymphadenopathy demonstrating significant progression since recent previous study. 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 Polycystic renal disease. Prostate gland enlargement with asymmetry of the right seminal vesicle. Bladder wall thickening could be due to cystitis or hypertrophy due to outlet obstruction. These results will be called to the ordering clinician or representative by the Radiologist Assistant, and communication documented in the PACS or zVision Dashboard. Electronically Signed   By: Lucienne Capers M.D.   On: 06/04/2015 04:59   Ct Chest Wo Contrast  06/12/2015  CLINICAL DATA:  History of B-cell lymphoma diagnosed last week. No chest complaints. Retroperitoneal mass. Staging. EXAM: CT CHEST WITHOUT CONTRAST TECHNIQUE: Multidetector CT imaging of the chest was performed following the standard protocol without IV contrast. COMPARISON:  Abdominal pelvic CT of 5/7/ 17. Chest radiograph 05/31/2015. FINDINGS: Mediastinum/Nodes: Left lower cervical and supraclavicular adenopathy. Example low left jugular node of 1.8 cm on image 7/series 2 No axillary adenopathy. Bilateral gynecomastia is moderate on the right and mild on the left. Tortuous thoracic aorta. Normal heart size, without pericardial effusion. Soft tissue fullness in the subcarinal station is likely indicative of adenopathy. Suboptimally evaluated secondary lack of contrast. Example 2.4 cm on image 53/ series 2. Hilar regions poorly evaluated without intravenous contrast. Significant retrocrural adenopathy, including at 2.8 cm on image 81/series 2 Lungs/Pleura: Trace left pleural fluid. Minimal right pleural thickening. Minimal degradation secondary to patient arm position, not  raised above the head. Mild right hemidiaphragm elevation with adjacent volume loss. Bibasilar dependent atelectasis. Upper abdomen: Polycystic kidney disease. Normal imaged portions of the liver, spleen, stomach, pancreas. Upper abdominal adenopathy, as on prior dedicated CTA. Musculoskeletal: No acute osseous abnormality. IMPRESSION: 1. Adenopathy within the low neck and low chest, consistent with active lymphoma. 2. Degraded exam secondary to patient right arm position and lack of IV contrast. 3. Small left pleural effusion with trace right pleural fluid. Electronically Signed   By: Abigail Miyamoto M.D.   On: 06/12/2015 15:34   Dg Chest Port 1 View  05/31/2015  CLINICAL DATA:  Abdominal pain.  Nausea and vomiting. EXAM: PORTABLE CHEST 1 VIEW COMPARISON:  The 07/19/2014; 01/15/2013 FINDINGS: Grossly unchanged enlarged cardiac silhouette and mediastinal contours given slightly reduced lung volumes. Atherosclerotic plaque within a mildly tortuous thoracic aorta. Minimal bibasilar heterogeneous opacities, favored to represent atelectasis. Mild pulmonary venous congestion left frank evidence of edema. No pleural effusion or pneumothorax. No acute osseus abnormalities. IMPRESSION: Cardiomegaly and bibasilar atelectasis without acute cardiopulmonary disease on this AP portable examination. Further evaluation with a PA and lateral chest radiograph may be obtained as clinically indicated. Electronically Signed   By: Sandi Mariscal M.D.   On: 05/31/2015 16:36    Labs:  CBC:  Recent Labs  05/31/15 1159 06/01/15 0506 06/03/15 0555 06/04/15 0117  WBC 4.8 4.8 7.0 8.2  HGB 8.0* 8.3* 8.8* 8.7*  HCT 24.3* 25.7* 27.4* 27.8*  PLT 292 268 268 251    COAGS:  Recent Labs  04/13/15 1644  INR 1.16  APTT 40*    BMP:  Recent Labs  06/01/15 0506 06/02/15 0918 06/03/15 0555 06/04/15 0117  NA 140 139 137 136  K 3.2* 3.5 4.1 3.9  CL 101 99* 96* 95*  CO2 23 26 25 24   GLUCOSE 85 143* 98 117*  BUN 39* 32* 22*  30*  CALCIUM 8.9 9.8 9.6 9.8  CREATININE 6.18* 5.99* 5.38* 6.91*  GFRNONAA 8* 9* 10* 7*  GFRAA 10* 10* 11* 8*    LIVER FUNCTION TESTS:  Recent Labs  04/13/15 1644 04/14/15 0630 05/31/15 1159 06/04/15 0117  BILITOT 0.6 0.6 0.5 0.4  AST 24 17 21  33  ALT 28 21 16* 18  ALKPHOS 48 38 40 46  PROT 8.2* 6.3* 6.9 8.1  ALBUMIN 3.4* 2.6* 3.5 4.1    TUMOR MARKERS: No results for input(s): AFPTM, CEA, CA199, CHROMGRNA in the last 8760 hours.  Assessment and Plan:  Retroperitoneal mass Lymphadenopathy Now scheduled for biopsy per Dr Alvy Bimler Risks and Benefits discussed with the patient including, but not limited to bleeding, infection, damage to adjacent structures or low yield requiring additional tests. All of the patient's questions were answered, patient is agreeable to proceed. Consent signed and in chart.    Thank you for this interesting consult.  I greatly enjoyed meeting READE GRATTON and look forward to participating in their care.  A copy of this report was sent to the requesting provider on this date.  Electronically Signed: Forestine Macho A 06/14/2015, 10:15 AM   I spent a total of  30 Minutes   in face to face in clinical consultation, greater than 50% of which was counseling/coordinating care for retroperitoneal mass bx

## 2015-06-16 ENCOUNTER — Encounter: Payer: Self-pay | Admitting: Hematology and Oncology

## 2015-06-16 ENCOUNTER — Telehealth: Payer: Self-pay | Admitting: Hematology and Oncology

## 2015-06-16 ENCOUNTER — Ambulatory Visit (HOSPITAL_BASED_OUTPATIENT_CLINIC_OR_DEPARTMENT_OTHER): Payer: Medicare Other | Admitting: Hematology and Oncology

## 2015-06-16 ENCOUNTER — Telehealth: Payer: Self-pay | Admitting: *Deleted

## 2015-06-16 VITALS — BP 105/57 | HR 128 | Temp 98.6°F | Resp 18 | Ht 71.0 in | Wt 193.3 lb

## 2015-06-16 DIAGNOSIS — C8338 Diffuse large B-cell lymphoma, lymph nodes of multiple sites: Secondary | ICD-10-CM | POA: Diagnosis not present

## 2015-06-16 DIAGNOSIS — N186 End stage renal disease: Secondary | ICD-10-CM | POA: Diagnosis not present

## 2015-06-16 DIAGNOSIS — Z992 Dependence on renal dialysis: Secondary | ICD-10-CM

## 2015-06-16 DIAGNOSIS — Q613 Polycystic kidney, unspecified: Secondary | ICD-10-CM | POA: Diagnosis not present

## 2015-06-16 DIAGNOSIS — R55 Syncope and collapse: Secondary | ICD-10-CM

## 2015-06-16 MED ORDER — PROCHLORPERAZINE MALEATE 10 MG PO TABS: 10.0000 mg | ORAL_TABLET | Freq: Four times a day (QID) | ORAL | Status: DC | PRN

## 2015-06-16 MED ORDER — LEVETIRACETAM 500 MG PO TABS: 500.0000 mg | ORAL_TABLET | Freq: Two times a day (BID) | ORAL | Status: DC

## 2015-06-16 MED ORDER — OXYCODONE-ACETAMINOPHEN 5-325 MG PO TABS: 1.0000 | ORAL_TABLET | ORAL | Status: DC | PRN

## 2015-06-16 MED ORDER — LIDOCAINE-PRILOCAINE 2.5-2.5 % EX CREA: TOPICAL_CREAM | CUTANEOUS | Status: DC

## 2015-06-16 MED ORDER — PREDNISONE 20 MG PO TABS
60.0000 mg | ORAL_TABLET | Freq: Every day | ORAL | Status: DC
Start: 2015-06-16 — End: 2016-02-26

## 2015-06-16 NOTE — Assessment & Plan Note (Addendum)
I spoke with the pathologist. Biopsy confirmed diffuse large B-cell lymphoma. The patient is at high risk due to hemodialysis, prior history of stroke and multiple other comorbidities. I reviewed with him the current cancer guidelines. Ultimately, the patient elected for R-CHOP chemotherapy. I plan to dose adjust his treatment due to the fact that he has hemodialysis and significant comorbidities. I will reduce Cytoxan, Adriamycin and vincristine by 50% for cycle 1. We discussed the role of chemotherapy. The intent is for cure. The decision was made based on publication at the [facility]. It is a category 1 recommendation from NCCN.  CHOP Chemotherapy plus Rituximab Compared with CHOP Alone in Elderly Patients with Diffuse Large-B-Cell Lymphoma Jannet Askew, M.D., Rexene Edison, M.D., Ph.D., Farley Ly, M.D., Crawford Givens, M.D., Knox Royalty, M.D., Ricky Ala, M.D., Doree Fudge, M.D., Eppie Gibson Richarda Blade, M.D., Simona Huh, M.D., Ph.D., Jolene Schimke, M.D., Bishop Limbo, M.D., Evelene Croon, Evelene Croon, Ph.D., and Sallyanne Kuster, M.D. Alison Stalling J Med 2002; 346:235-242January 24, 2002DOI: 10.1056/[id number]  The rate of complete response was significantly higher in the group that received CHOP plus rituximab than in the group that received CHOP alone (76 percent vs. 63 percent, P=0.005). With a median follow-up of two years, event-free and overall survival times were significantly higher in the CHOP-plus-rituximab group (P<0.001 and P=0.007, respectively). The addition of rituximab to standard CHOP chemotherapy significantly reduced the risk of treatment failure and death (risk ratios, 0.58 [95 percent confidence interval, 0.44 to 0.77] and 0.64 [0.45 to 0.89], respectively). Clinically relevant toxicity was not significantly greater with CHOP plus rituximab.  We discussed some of the risks, benefits and side-effects of Rituximab,Cytoxan, Adriamycin,Vincristine and  Solumedrol/Prednisone.   Some of the short term side-effects included, though not limited to, risk of fatigue, weight loss, tumor lysis syndrome, risk of allergic reactions, pancytopenia, life-threatening infections, need for transfusions of blood products, nausea, vomiting, change in bowel habits, hair loss, risk of congestive heart failure, admission to hospital for various reasons, and risks of death.   Long term side-effects are also discussed including permanent damage to nerve function, chronic fatigue, and rare secondary malignancy including bone marrow disorders.   The patient is aware that the response rates discussed earlier is not guaranteed.    After a long discussion, patient made an informed decision to proceed with the prescribed plan of care  I will schedule port placement and baseline echocardiogram. I would not pursue bone marrow biopsy right now as it would not change treatment decision. I will coordinate care with his nephrologist and I will work in treatment schedule that would work for him around his hemodialysis schedule. We'll see him prior to cycle 3 of treatment

## 2015-06-16 NOTE — Progress Notes (Signed)
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 He returns to review test results. He felt that his pain is better controlled with current pain medicine and requests a refill. Constipation has resolved. Denies nausea.  REVIEW OF SYSTEMS:   Constitutional: Denies fevers, chills or abnormal weight loss Eyes: Denies blurriness of vision Ears, nose, mouth, throat, and face: Denies mucositis or sore throat Respiratory: Denies cough, dyspnea or wheezes Cardiovascular: Denies palpitation, chest discomfort or lower extremity swelling Skin: Denies abnormal skin rashes Lymphatics: Denies new lymphadenopathy or easy bruising Neurological:Denies numbness, tingling or new weaknesses Behavioral/Psych: Mood is stable, no new changes  All other systems were reviewed with the patient and are negative.  I have reviewed the past medical history, past surgical history, social history and family history with the patient and they are unchanged from previous note.  ALLERGIES:  has No Known Allergies.  MEDICATIONS:  Current Outpatient Prescriptions  Medication Sig Dispense Refill  . aspirin 325 MG tablet Take 1 tablet (325 mg total) by mouth daily. 30 tablet 0  . calcium acetate  (PHOSLO) 667 MG capsule Take 1 capsule by mouth 2 (two) times daily.   11  . cloNIDine (CATAPRES) 0.2 MG tablet Take 0.4 mg by mouth 2 (two) times daily.     Marland Kitchen docusate sodium (COLACE) 100 MG capsule Take 1 capsule (100 mg total) by mouth every 12 (twelve) hours. (Patient taking differently: Take 100 mg by mouth every 12 (twelve) hours. ) 60 capsule 0  . doxazosin (CARDURA) 8 MG tablet Take 8 mg by mouth at bedtime.   6  . isosorbide-hydrALAZINE (BIDIL) 20-37.5 MG tablet Take 0.5 tablets by mouth 2 (two) times daily.    Marland Kitchen levETIRAcetam (KEPPRA) 500 MG tablet Take 1 tablet (500 mg total) by mouth 2 (two) times daily. 60 tablet 6  . levothyroxine (SYNTHROID, LEVOTHROID) 25 MCG tablet Take 25 mcg by mouth daily.   0  . metoprolol (LOPRESSOR) 100 MG tablet Take 150 mg by mouth 2 (two) times daily.     . multivitamin (RENA-VIT) TABS tablet Take 1 tablet by mouth at bedtime. 90 tablet 0  . ondansetron (ZOFRAN ODT) 8 MG disintegrating tablet Take 1 tablet (8 mg total) by mouth every 8 (eight) hours as needed for nausea or vomiting. 12 tablet 0  . OVER THE COUNTER MEDICATION Take 1 capsule by mouth 2 (two) times daily. "Kidney Factor"    . oxyCODONE-acetaminophen (PERCOCET/ROXICET) 5-325 MG tablet Take 1 tablet by mouth every 4 (four) hours as needed for moderate pain. 90 tablet 0  . pentafluoroprop-tetrafluoroeth (GEBAUERS) AERO Apply 1 application topically as needed (topical anesthesia for hemodialysis). 30 mL 0  . lidocaine-prilocaine (EMLA) cream Apply to affected area once 30 g 3  . predniSONE (DELTASONE) 20 MG tablet Take 3 tablets (60 mg total) by mouth daily. Take on days  2-5 of chemotherapy. 12 tablet 6  . prochlorperazine (COMPAZINE) 10 MG tablet Take 1 tablet (10 mg total) by mouth every 6 (six) hours as needed (Nausea or vomiting). 30 tablet 6   No current facility-administered medications for this visit.    PHYSICAL EXAMINATION: ECOG PERFORMANCE STATUS: 1 - Symptomatic but completely  ambulatory  Filed Vitals:   06/16/15 1036  BP: 105/57  Pulse: 128  Temp: 98.6 F (37 C)  Resp: 18   Filed Weights   06/16/15 1036  Weight: 193 lb 4.8 oz (87.68 kg)    GENERAL:alert, no distress and comfortable SKIN: skin color, texture, turgor are normal, no rashes or significant lesions EYES: normal, Conjunctiva are pink and non-injected, sclera clear Musculoskeletal:no cyanosis of digits and no clubbing  NEURO: alert & oriented x 3 with fluent speech, no focal motor/sensory deficits  LABORATORY DATA:  I have reviewed the data as listed    Component Value Date/Time   NA 136 06/04/2015 0117   K 3.9 06/04/2015 0117   CL 95* 06/04/2015 0117   CO2 24 06/04/2015 0117   GLUCOSE 117* 06/04/2015 0117   BUN 30* 06/04/2015 0117   CREATININE 6.91* 06/04/2015 0117   CALCIUM 9.8 06/04/2015 0117   PROT 8.1 06/04/2015 0117   ALBUMIN 4.1 06/04/2015 0117   AST 33 06/04/2015 0117   ALT 18 06/04/2015 0117   ALKPHOS 46 06/04/2015 0117   BILITOT 0.4 06/04/2015 0117   GFRNONAA 7* 06/04/2015 0117   GFRAA 8* 06/04/2015 0117    No results found for: SPEP, UPEP  Lab Results  Component Value Date   WBC 8.4 06/14/2015   NEUTROABS 6.9 04/13/2015   HGB 8.4* 06/14/2015   HCT 26.8* 06/14/2015   MCV 91.5 06/14/2015   PLT 356 06/14/2015      Chemistry      Component Value Date/Time   NA 136 06/04/2015 0117   K 3.9 06/04/2015 0117   CL 95* 06/04/2015 0117   CO2 24 06/04/2015 0117   BUN 30* 06/04/2015 0117   CREATININE 6.91* 06/04/2015 0117      Component Value Date/Time   CALCIUM 9.8 06/04/2015 0117   ALKPHOS 46 06/04/2015 0117   AST 33 06/04/2015 0117   ALT 18 06/04/2015 0117   BILITOT 0.4 06/04/2015 0117       RADIOGRAPHIC STUDIES:I reviewed imaging study with him and family I have personally reviewed the radiological images as listed and agreed with the findings in the report.    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 Biopsy confirmed diffuse large B-cell lymphoma. The patient is at high risk due to hemodialysis, prior history of stroke and multiple other comorbidities. I reviewed with him the current cancer guidelines. Ultimately, the patient elected for R-CHOP chemotherapy. I plan to dose adjust his treatment due to the fact that he has hemodialysis and significant comorbidities. I will reduce Cytoxan, Adriamycin and vincristine by 50% for cycle 1. We discussed the role of chemotherapy. The intent is for cure. The decision was made based on publication at the [facility]. It is a category 1 recommendation from NCCN.  CHOP Chemotherapy plus Rituximab Compared with CHOP Alone in Elderly Patients with Diffuse Large-B-Cell Lymphoma Jannet Askew, M.D., Rexene Edison, M.D., Ph.D., Farley Ly, M.D., Crawford Givens, M.D., Knox Royalty, M.D., Ricky Ala, M.D., Doree Fudge, M.D., Eppie Gibson Richarda Blade, M.D., Simona Huh, M.D., Ph.D., Jolene Schimke, M.D., Bishop Limbo, M.D., Evelene Croon,  Evelene Croon, Ph.D., and Sallyanne Kuster, M.D. Alison Stalling J Med 2002; 346:235-242January 24, 2002DOI: 10.1056/[id number]  The rate of complete response was significantly higher in the group that received CHOP plus rituximab than in the group that received CHOP alone (76 percent vs. 63 percent, P=0.005). With a median follow-up of two years, event-free and overall survival times were significantly higher in the CHOP-plus-rituximab group (P<0.001 and P=0.007, respectively). The addition of rituximab to standard CHOP chemotherapy significantly reduced the risk of treatment failure and death (risk ratios, 0.58 [95 percent confidence interval, 0.44 to 0.77] and 0.64 [0.45 to 0.89], respectively). Clinically relevant toxicity was not significantly greater with CHOP plus rituximab.  We discussed some of the risks, benefits and side-effects of Rituximab,Cytoxan, Adriamycin,Vincristine and  Solumedrol/Prednisone.   Some of the short term side-effects included, though not limited to, risk of fatigue, weight loss, tumor lysis syndrome, risk of allergic reactions, pancytopenia, life-threatening infections, need for transfusions of blood products, nausea, vomiting, change in bowel habits, hair loss, risk of congestive heart failure, admission to hospital for various reasons, and risks of death.   Long term side-effects are also discussed including permanent damage to nerve function, chronic fatigue, and rare secondary malignancy including bone marrow disorders.   The patient is aware that the response rates discussed earlier is not guaranteed.    After a long discussion, patient made an informed decision to proceed with the prescribed plan of care  I will schedule port placement and baseline echocardiogram. I would not pursue bone marrow biopsy right now as it would not change treatment decision. I will coordinate care with his nephrologist and I will work in treatment schedule that would work for him around his hemodialysis schedule. We'll see him prior to cycle 3 of treatment  End stage renal failure on dialysis [facility]) The patient is currently receiving hemodialysis. The cause of his renal failure is due to polycystic kidney disease. I will contact his nephrologist to coordinate care during treatment   Orders Placed This Encounter  Procedures  . IR Fluoro Guide CV Line Right    Indicate type of CVC ordering    Standing Status: Future     Number of Occurrences:      Standing Expiration Date: 09/14/2016    Order Specific Question:  Reason for exam:    Answer:  need port for chemo    Order Specific Question:  Preferred Imaging Location?    Answer:  [facility]  . CBC with Differential    Standing Status: Standing     Number of Occurrences: 20     Standing Expiration Date: 06/16/2016  . Comprehensive metabolic panel    Standing Status: Standing     Number of  Occurrences: 20     Standing Expiration Date: 06/16/2016  . ECHOCARDIOGRAM COMPLETE    Standing Status: Future     Number of Occurrences:      Standing Expiration Date: 09/14/2016    Order Specific Question:  Where should this test be performed    Answer:  [facility]    Order Specific Question:  Complete or Limited study?    Answer:  Limited    Order Specific Question:  With Image Enhancing Agent or without Image Enhancing Agent?    Answer:  Without Image Enhancing Agent    Order Specific Question:  Contraindication for Image Enhancing Agent?    Answer:  Per Appointment Conversion    Order Specific Question:  Reason for exam-Echo    Answer:  Chemo  V67.2 / [id number]   All questions were answered. The patient knows to call the clinic with any problems, questions or concerns. No barriers to learning was detected. I spent 40 minutes counseling the patient face to face. The total time spent in the appointment was 60 minutes and more than 50% was on counseling and review of test results     [facility], [place], MD 06/16/2015 4:44 PM

## 2015-06-16 NOTE — Telephone Encounter (Signed)
lvm for pt for echo 5.24 9am

## 2015-06-16 NOTE — Assessment & Plan Note (Signed)
The patient is currently receiving hemodialysis. The cause of his renal failure is due to polycystic kidney disease. I will contact his nephrologist to coordinate care during treatment

## 2015-06-16 NOTE — Telephone Encounter (Signed)
Port placement scheduled for Wednesday, 5/24 @ 1130.  Reviewed 5/24 schedule with patient   0900 Echo- [facility] 1000 Chemo class- [facility] 1130 Port placement and labs- [facility] Radiology  NPO after 0700.  Pt verbalized understanding

## 2015-06-16 NOTE — Telephone Encounter (Signed)
Gave and printed appt sched and avs for pt for May and JUNE °

## 2015-06-20 ENCOUNTER — Other Ambulatory Visit: Payer: Self-pay | Admitting: General Surgery

## 2015-06-20 ENCOUNTER — Other Ambulatory Visit: Payer: Self-pay | Admitting: Radiology

## 2015-06-21 ENCOUNTER — Other Ambulatory Visit: Payer: PRIVATE HEALTH INSURANCE

## 2015-06-21 ENCOUNTER — Ambulatory Visit (HOSPITAL_COMMUNITY)
Admission: RE | Admit: 2015-06-21 | Discharge: 2015-06-21 | Disposition: A | Discharge status: Home / Self Care | Payer: PRIVATE HEALTH INSURANCE | Source: Ambulatory Visit | Attending: Hematology and Oncology | Admitting: Hematology and Oncology

## 2015-06-21 ENCOUNTER — Encounter: Payer: Self-pay | Admitting: *Deleted

## 2015-06-21 ENCOUNTER — Other Ambulatory Visit: Payer: Medicare Other

## 2015-06-21 ENCOUNTER — Other Ambulatory Visit: Payer: Self-pay | Admitting: Hematology and Oncology

## 2015-06-21 ENCOUNTER — Encounter (HOSPITAL_COMMUNITY): Payer: Self-pay

## 2015-06-21 DIAGNOSIS — Z79899 Other long term (current) drug therapy: Secondary | ICD-10-CM | POA: Insufficient documentation

## 2015-06-21 DIAGNOSIS — E1122 Type 2 diabetes mellitus with diabetic chronic kidney disease: Secondary | ICD-10-CM | POA: Diagnosis not present

## 2015-06-21 DIAGNOSIS — C8338 Diffuse large B-cell lymphoma, lymph nodes of multiple sites: Secondary | ICD-10-CM | POA: Insufficient documentation

## 2015-06-21 DIAGNOSIS — N185 Chronic kidney disease, stage 5: Secondary | ICD-10-CM | POA: Diagnosis not present

## 2015-06-21 DIAGNOSIS — I69351 Hemiplegia and hemiparesis following cerebral infarction affecting right dominant side: Secondary | ICD-10-CM | POA: Diagnosis not present

## 2015-06-21 DIAGNOSIS — I071 Rheumatic tricuspid insufficiency: Secondary | ICD-10-CM | POA: Diagnosis not present

## 2015-06-21 DIAGNOSIS — E039 Hypothyroidism, unspecified: Secondary | ICD-10-CM | POA: Diagnosis not present

## 2015-06-21 DIAGNOSIS — Z7982 Long term (current) use of aspirin: Secondary | ICD-10-CM | POA: Insufficient documentation

## 2015-06-21 DIAGNOSIS — R55 Syncope and collapse: Secondary | ICD-10-CM

## 2015-06-21 DIAGNOSIS — I120 Hypertensive chronic kidney disease with stage 5 chronic kidney disease or end stage renal disease: Secondary | ICD-10-CM | POA: Diagnosis not present

## 2015-06-21 LAB — CBC WITH DIFFERENTIAL/PLATELET
Basophils Absolute: 0 10*3/uL (ref 0.0–0.1)
Basophils Relative: 1 %
Eosinophils Absolute: 0.3 10*3/uL (ref 0.0–0.7)
Eosinophils Relative: 5 %
HEMATOCRIT: 24.3 % — AB (ref 39.0–52.0)
HEMOGLOBIN: 7.7 g/dL — AB (ref 13.0–17.0)
LYMPHS ABS: 0.5 10*3/uL — AB (ref 0.7–4.0)
LYMPHS PCT: 8 %
MCH: 27.7 pg (ref 26.0–34.0)
MCHC: 31.7 g/dL (ref 30.0–36.0)
MCV: 87.4 fL (ref 78.0–100.0)
MONOS PCT: 12 %
Monocytes Absolute: 0.8 10*3/uL (ref 0.1–1.0)
NEUTROS ABS: 5.1 10*3/uL (ref 1.7–7.7)
NEUTROS PCT: 74 %
Platelets: 292 10*3/uL (ref 150–400)
RBC: 2.78 MIL/uL — AB (ref 4.22–5.81)
RDW: 15.1 % (ref 11.5–15.5)
WBC: 6.8 10*3/uL (ref 4.0–10.5)

## 2015-06-21 LAB — COMPREHENSIVE METABOLIC PANEL
ALT: 21 U/L (ref 17–63)
ANION GAP: 14 (ref 5–15)
AST: 29 U/L (ref 15–41)
Albumin: 3.5 g/dL (ref 3.5–5.0)
Alkaline Phosphatase: 46 U/L (ref 38–126)
BUN: 40 mg/dL — ABNORMAL HIGH (ref 6–20)
CHLORIDE: 95 mmol/L — AB (ref 101–111)
CO2: 28 mmol/L (ref 22–32)
CREATININE: 6.66 mg/dL — AB (ref 0.61–1.24)
Calcium: 8.8 mg/dL — ABNORMAL LOW (ref 8.9–10.3)
GFR, EST AFRICAN AMERICAN: 9 mL/min — AB (ref >60)
GFR, EST NON AFRICAN AMERICAN: 8 mL/min — AB (ref >60)
Glucose, Bld: 74 mg/dL (ref 65–99)
POTASSIUM: 3.7 mmol/L (ref 3.5–5.1)
SODIUM: 137 mmol/L (ref 135–145)
Total Bilirubin: 0.9 mg/dL (ref 0.3–1.2)
Total Protein: 7.7 g/dL (ref 6.5–8.1)

## 2015-06-21 LAB — GLUCOSE, CAPILLARY
GLUCOSE-CAPILLARY: 75 mg/dL (ref 65–99)
Glucose-Capillary: 72 mg/dL (ref 65–99)

## 2015-06-21 MED ORDER — MIDAZOLAM HCL 2 MG/2ML IJ SOLN
INTRAMUSCULAR | Status: AC | PRN
  Administered 2015-06-21 (×2): 1 mg via INTRAVENOUS

## 2015-06-21 MED ORDER — CEFAZOLIN SODIUM-DEXTROSE 2-4 GM/100ML-% IV SOLN
2.0000 g | INTRAVENOUS | Status: DC
  Filled 2015-06-21: qty 100

## 2015-06-21 MED ORDER — FENTANYL CITRATE (PF) 100 MCG/2ML IJ SOLN
INTRAMUSCULAR | Status: AC
  Filled 2015-06-21: qty 2

## 2015-06-21 MED ORDER — MIDAZOLAM HCL 2 MG/2ML IJ SOLN
INTRAMUSCULAR | Status: AC
  Filled 2015-06-21: qty 2

## 2015-06-21 MED ORDER — LIDOCAINE HCL 1 % IJ SOLN
INTRAMUSCULAR | Status: AC | PRN
  Administered 2015-06-21: 5 mL via INTRADERMAL

## 2015-06-21 MED ORDER — LIDOCAINE-EPINEPHRINE (PF) 2 %-1:200000 IJ SOLN
INTRAMUSCULAR | Status: AC
  Filled 2015-06-21: qty 20

## 2015-06-21 MED ORDER — FENTANYL CITRATE (PF) 100 MCG/2ML IJ SOLN
INTRAMUSCULAR | Status: AC | PRN
  Administered 2015-06-21: 50 ug via INTRAVENOUS

## 2015-06-21 MED ORDER — DEXTROSE-NACL 5-0.45 % IV SOLN
INTRAVENOUS | Status: DC
  Administered 2015-06-21: 14:00:00 via INTRAVENOUS

## 2015-06-21 MED ORDER — LIDOCAINE-EPINEPHRINE (PF) 1 %-1:200000 IJ SOLN
INTRAMUSCULAR | Status: AC | PRN
  Administered 2015-06-21: 10 mL via INTRADERMAL

## 2015-06-21 MED ORDER — LIDOCAINE HCL 1 % IJ SOLN
INTRAMUSCULAR | Status: AC
  Filled 2015-06-21: qty 20

## 2015-06-21 MED ORDER — HEPARIN SOD (PORK) LOCK FLUSH 100 UNIT/ML IV SOLN
INTRAVENOUS | Status: AC
  Filled 2015-06-21: qty 5

## 2015-06-21 MED ORDER — SODIUM CHLORIDE 0.9 % IV SOLN: INTRAVENOUS | Status: DC

## 2015-06-21 NOTE — Procedures (Signed)
Interventional Radiology Procedure Note  Procedure: Placement of a right IJ approach single lumen PowerPort.  Tip is positioned at the superior cavoatrial junction and catheter is ready for immediate use.  Complications: No immediate Recommendations:  - Ok to shower tomorrow - Do not submerge for 7 days - Routine line care   Signed,  Cliff Damiani K. Tobby Fawcett, MD   

## 2015-06-21 NOTE — Discharge Instructions (Signed)
Moderate Conscious Sedation, Adult, Care After Refer to this sheet in the next few weeks. These instructions provide you with information on caring for yourself after your procedure. Your health care provider may also give you more specific instructions. Your treatment has been planned according to current medical practices, but problems sometimes occur. Call your health care provider if you have any problems or questions after your procedure. WHAT TO EXPECT AFTER THE PROCEDURE  After your procedure:  You may feel sleepy, clumsy, and have poor balance for several hours.  Vomiting may occur if you eat too soon after the procedure. HOME CARE INSTRUCTIONS  Do not participate in any activities where you could become injured for at least 24 hours. Do not:  Drive.  Swim.  Ride a bicycle.  Operate heavy machinery.  Cook.  Use power tools.  Climb ladders.  Work from a high place.  Do not make important decisions or sign legal documents until you are improved.  If you vomit, drink water, juice, or soup when you can drink without vomiting. Make sure you have little or no nausea before eating solid foods.  Only take over-the-counter or prescription medicines for pain, discomfort, or fever as directed by your health care provider.  Make sure you and your family fully understand everything about the medicines given to you, including what side effects may occur.  You should not drink alcohol, take sleeping pills, or take medicines that cause drowsiness for at least 24 hours.  If you smoke, do not smoke without supervision.  If you are feeling better, you may resume normal activities 24 hours after you were sedated.  Keep all appointments with your health care provider. SEEK MEDICAL CARE IF:  Your skin is pale or bluish in color.  You continue to feel nauseous or vomit.  Your pain is getting worse and is not helped by medicine.  You have bleeding or swelling.  You are still  sleepy or feeling clumsy after 24 hours. SEEK IMMEDIATE MEDICAL CARE IF:  You develop a rash.  You have difficulty breathing.  You develop any type of allergic problem.  You have a fever. MAKE SURE YOU:  Understand these instructions.  Will watch your condition.  Will get help right away if you are not doing well or get worse.   This information is not intended to replace advice given to you by your health care provider. Make sure you discuss any questions you have with your health care provider.   Document Released: 11/04/2012 Document Revised: 02/04/2014 Document Reviewed: 11/04/2012 Elsevier Interactive Patient Education 2016 [place] Insertion, Care After Refer to this sheet in the next few weeks. These instructions provide you with information on caring for yourself after your procedure. Your health care provider may also give you more specific instructions. Your treatment has been planned according to current medical practices, but problems sometimes occur. Call your health care provider if you have any problems or questions after your procedure. WHAT TO EXPECT AFTER THE PROCEDURE After your procedure, it is typical to have the following:   Discomfort at the port insertion site. Ice packs to the area will help.  Bruising on the skin over the port. This will subside in 3-4 days. HOME CARE INSTRUCTIONS  After your port is placed, you will get a manufacturer's information card. The card has information about your port. Keep this card with you at all times.   Know what kind of port you have. There are many  types of ports available.   Wear a medical alert bracelet in case of an emergency. This can help alert health care workers that you have a port.   The port can stay in for as long as your health care provider believes it is necessary.   A home health care nurse may give medicines and take care of the port.   You or a family member can get  special training and directions for giving medicine and taking care of the port at home.  SEEK MEDICAL CARE IF:   Your port does not flush or you are unable to get a blood return.   You have a fever or chills. SEEK IMMEDIATE MEDICAL CARE IF:  You have new fluid or pus coming from your incision.   You notice a bad smell coming from your incision site.   You have swelling, pain, or more redness at the incision or port site.   You have chest pain or shortness of breath.   This information is not intended to replace advice given to you by your health care provider. Make sure you discuss any questions you have with your health care provider.   Document Released: 11/04/2012 Document Revised: 01/19/2013 Document Reviewed: 11/04/2012 Elsevier Interactive Patient Education 2016 [employer].   May remove dressing and shower in 24 to 48 hours. May use EMLA cream in 7 days.

## 2015-06-21 NOTE — Progress Notes (Signed)
  Echocardiogram 2D Echocardiogram has been performed.  Javier Jenkins 06/21/2015, 10:12 AM

## 2015-06-21 NOTE — H&P (Signed)
Referring Physician(s): Heath Lark  Supervising Physician: Jacqulynn Cadet  Patient Status: OP  Chief Complaint:  "I'm getting a port a cath"  Subjective: Patient familiar to IR service from recent biopsy of left para-aortic retroperitoneal lymph node on 06/14/15. He has been recently diagnosed with diffuse large B-cell lymphoma and presents today for Port-A-Cath placement for chemotherapy. He currently denies fever, headache, chest pain, dyspnea, cough, abdominal pain, nausea, vomiting or abnormal bleeding. He does have residual right-sided weakness from prior stroke. He is also on hemodialysis. Additional past medical history as below. Past Medical History  Diagnosis Date  . Hypertension   . Stroke [facility])     right side paralysis  . Hypothyroidism   . Diabetes mellitus without complication ([place])     not on medications  . Chronic kidney disease     stage 5  . Constipation   . 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 Non-Hodgkin lymphoma of intra-abdominal lymph nodes ([place]) 06/07/2015  . 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 Hemorrhoid surgery    . Av fistula placement Left 10/18/2014    Procedure: ARTERIOVENOUS (AV) FISTULA CREATION;  Surgeon: Angelia Mould, MD;  Location: [place];  Service: Vascular;  Laterality: Left;  . Burr hole Right 03/17/2015    Procedure: BURR HOLES FOR RIGHT SUBDURAL HEMATOMA;  Surgeon: Leeroy Cha, MD;  Location: [place] NEURO ORS;  Service: Neurosurgery;  Laterality: Right;      Allergies: Review of patient's allergies indicates no known allergies.  Medications: Prior to Admission medications   Medication Sig Start Date End Date Taking? Authorizing Provider  OVER THE COUNTER MEDICATION Take 1 capsule by mouth 2 (two) times daily. "Kidney Factor"   Yes Historical Provider, MD  aspirin 325 MG tablet Take 1 tablet (325 mg total) by mouth daily.  04/21/15   Reyne Dumas, MD  calcium acetate (PHOSLO) 667 MG capsule Take 1 capsule by mouth 2 (two) times daily.  04/06/15   Historical Provider, MD  cloNIDine (CATAPRES) 0.2 MG tablet Take 0.4 mg by mouth 2 (two) times daily.     Historical Provider, MD  docusate sodium (COLACE) 100 MG capsule Take 1 capsule (100 mg total) by mouth every 12 (twelve) hours. Patient taking differently: Take 100 mg by mouth every 12 (twelve) hours.  04/03/14   Junius Creamer, NP  doxazosin (CARDURA) 8 MG tablet Take 8 mg by mouth at bedtime.  02/22/15   Historical Provider, MD  isosorbide-hydrALAZINE (BIDIL) 20-37.5 MG tablet Take 0.5 tablets by mouth 2 (two) times daily.    Historical Provider, MD  levETIRAcetam (KEPPRA) 500 MG tablet Take 1 tablet (500 mg total) by mouth 2 (two) times daily. 06/16/15   Heath Lark, MD  levothyroxine (SYNTHROID, LEVOTHROID) 25 MCG tablet Take 25 mcg by mouth daily.  01/29/14   Historical Provider, MD  lidocaine-prilocaine (EMLA) cream Apply to affected area once 06/16/15   Heath Lark, MD  metoprolol (LOPRESSOR) 100 MG tablet Take 150 mg by mouth 2 (two) times daily.     Historical Provider, MD  multivitamin (RENA-VIT) TABS tablet Take 1 tablet by mouth at bedtime. 06/03/15   Mir Marry Guan, MD  ondansetron (ZOFRAN ODT) 8 MG disintegrating tablet Take 1 tablet (8 mg total) by mouth every 8 (eight) hours as needed for nausea or vomiting. 06/04/15   Jola Schmidt, MD  oxyCODONE-acetaminophen (PERCOCET/ROXICET) 5-325 MG tablet Take 1 tablet by mouth every  4 (four) hours as needed for moderate pain. 06/16/15   Heath Lark, MD  pentafluoroprop-tetrafluoroeth Landry Dyke) AERO Apply 1 application topically as needed (topical anesthesia for hemodialysis). 06/03/15   Mir Marry Guan, MD  predniSONE (DELTASONE) 20 MG tablet Take 3 tablets (60 mg total) by mouth daily. Take on days 2-5 of chemotherapy. 06/16/15   Heath Lark, MD  prochlorperazine (COMPAZINE) 10 MG tablet Take 1 tablet (10 mg total) by  mouth every 6 (six) hours as needed (Nausea or vomiting). 06/16/15   Heath Lark, MD     Vital Signs: BP 153/92 mmHg  Pulse 75  Temp(Src) 97.5 F (36.4 C) (Oral)  Resp 18  SpO2 100%  Physical Exam  patient awake, alert. Chest clear to auscultation bilaterally. Heart with regular rate and rhythm. Left upper arm dialysis fistula with good thrill and bruit; abdomen soft, positive bowel sounds, nontender; 1+ right lower extremity edema, no left lower extremity edema; residual weakness noted right arm greater than leg  Imaging: No results found.  Labs:  CBC:  Recent Labs  06/01/15 0506 06/03/15 0555 06/04/15 0117 06/14/15 1011  WBC 4.8 7.0 8.2 8.4  HGB 8.3* 8.8* 8.7* 8.4*  HCT 25.7* 27.4* 27.8* 26.8*  PLT 268 268 251 356    COAGS:  Recent Labs  04/13/15 1644 06/14/15 1011  INR 1.16 1.02  APTT 40* 23*    BMP:  Recent Labs  06/01/15 0506 06/02/15 0918 06/03/15 0555 06/04/15 0117  NA 140 139 137 136  K 3.2* 3.5 4.1 3.9  CL 101 99* 96* 95*  CO2 23 26 25 24   GLUCOSE 85 143* 98 117*  BUN 39* 32* 22* 30*  CALCIUM 8.9 9.8 9.6 9.8  CREATININE 6.18* 5.99* 5.38* 6.91*  GFRNONAA 8* 9* 10* 7*  GFRAA 10* 10* 11* 8*    LIVER FUNCTION TESTS:  Recent Labs  04/13/15 1644 04/14/15 0630 05/31/15 1159 06/04/15 0117  BILITOT 0.6 0.6 0.5 0.4  AST 24 17 21  33  ALT 28 21 16* 18  ALKPHOS 48 38 40 46  PROT 8.2* 6.3* 6.9 8.1  ALBUMIN 3.4* 2.6* 3.5 4.1    Assessment and Plan: Patient with newly diagnosed diffuse large B cell, non-Hodgkin's lymphoma; plan today is for Port-A-Cath placement for chemotherapy.Risks and benefits discussed with the patient/family including, but not limited to bleeding, infection, pneumothorax, or fibrin sheath development and need for additional procedures.All of the patient's questions were answered, patient is agreeable to proceed.Consent signed and in chart. Labs pending.     Electronically Signed: D. Rowe Robert 06/21/2015, 12:55  PM   I spent a total of 20 minutes at the the patient's bedside AND on the patient's hospital floor or unit, greater than 50% of which was counseling/coordinating care for Port-A-Cath placement

## 2015-06-22 ENCOUNTER — Other Ambulatory Visit: Payer: Self-pay | Admitting: Hematology and Oncology

## 2015-06-23 ENCOUNTER — Telehealth: Payer: Self-pay | Admitting: *Deleted

## 2015-06-23 ENCOUNTER — Ambulatory Visit (HOSPITAL_BASED_OUTPATIENT_CLINIC_OR_DEPARTMENT_OTHER): Payer: PRIVATE HEALTH INSURANCE

## 2015-06-23 ENCOUNTER — Other Ambulatory Visit: Payer: Self-pay | Admitting: Hematology and Oncology

## 2015-06-23 ENCOUNTER — Telehealth: Payer: Self-pay | Admitting: Hematology and Oncology

## 2015-06-23 VITALS — BP 151/93 | HR 90 | Temp 98.2°F | Resp 17

## 2015-06-23 DIAGNOSIS — Z5111 Encounter for antineoplastic chemotherapy: Secondary | ICD-10-CM | POA: Diagnosis not present

## 2015-06-23 DIAGNOSIS — Z5112 Encounter for antineoplastic immunotherapy: Secondary | ICD-10-CM | POA: Diagnosis not present

## 2015-06-23 DIAGNOSIS — C8338 Diffuse large B-cell lymphoma, lymph nodes of multiple sites: Secondary | ICD-10-CM

## 2015-06-23 MED ORDER — ACETAMINOPHEN 325 MG PO TABS
ORAL_TABLET | ORAL | Status: AC
  Filled 2015-06-23: qty 2

## 2015-06-23 MED ORDER — DIPHENHYDRAMINE HCL 25 MG PO CAPS
50.0000 mg | ORAL_CAPSULE | Freq: Once | ORAL | Status: AC
  Administered 2015-06-23: 50 mg via ORAL

## 2015-06-23 MED ORDER — DOXORUBICIN HCL CHEMO IV INJECTION 2 MG/ML
25.0000 mg/m2 | Freq: Once | INTRAVENOUS | Status: AC
  Administered 2015-06-23: 52 mg via INTRAVENOUS
  Filled 2015-06-23: qty 26

## 2015-06-23 MED ORDER — HEPARIN SOD (PORK) LOCK FLUSH 100 UNIT/ML IV SOLN
500.0000 [IU] | Freq: Once | INTRAVENOUS | Status: AC | PRN
  Administered 2015-06-23: 500 [IU]
  Filled 2015-06-23: qty 5

## 2015-06-23 MED ORDER — SODIUM CHLORIDE 0.9 % IV SOLN
10.0000 mg | Freq: Once | INTRAVENOUS | Status: AC
  Administered 2015-06-23: 10 mg via INTRAVENOUS
  Filled 2015-06-23: qty 1

## 2015-06-23 MED ORDER — SODIUM CHLORIDE 0.9 % IV SOLN
375.0000 mg/m2 | Freq: Once | INTRAVENOUS | Status: AC
  Administered 2015-06-23: 780 mg via INTRAVENOUS
  Filled 2015-06-23: qty 39

## 2015-06-23 MED ORDER — SODIUM CHLORIDE 0.9% FLUSH
10.0000 mL | INTRAVENOUS | Status: DC | PRN
  Administered 2015-06-23: 10 mL
  Filled 2015-06-23: qty 10

## 2015-06-23 MED ORDER — SODIUM CHLORIDE 0.9 % IV SOLN
375.0000 mg/m2 | Freq: Once | INTRAVENOUS | Status: AC
  Administered 2015-06-23: 800 mg via INTRAVENOUS
  Filled 2015-06-23: qty 70

## 2015-06-23 MED ORDER — ACETAMINOPHEN 325 MG PO TABS
650.0000 mg | ORAL_TABLET | Freq: Once | ORAL | Status: AC
  Administered 2015-06-23: 650 mg via ORAL

## 2015-06-23 MED ORDER — SODIUM CHLORIDE 0.9 % IV SOLN
Freq: Once | INTRAVENOUS | Status: AC
  Administered 2015-06-23: 11:00:00 via INTRAVENOUS

## 2015-06-23 MED ORDER — PALONOSETRON HCL INJECTION 0.25 MG/5ML
0.2500 mg | Freq: Once | INTRAVENOUS | Status: AC
  Administered 2015-06-23: 0.25 mg via INTRAVENOUS

## 2015-06-23 MED ORDER — PALONOSETRON HCL INJECTION 0.25 MG/5ML
INTRAVENOUS | Status: AC
  Filled 2015-06-23: qty 5

## 2015-06-23 MED ORDER — DIPHENHYDRAMINE HCL 25 MG PO CAPS
ORAL_CAPSULE | ORAL | Status: AC
  Filled 2015-06-23: qty 2

## 2015-06-23 MED ORDER — VINCRISTINE SULFATE CHEMO INJECTION 1 MG/ML
1.0000 mg | Freq: Once | INTRAVENOUS | Status: AC
  Administered 2015-06-23: 1 mg via INTRAVENOUS
  Filled 2015-06-23: qty 1

## 2015-06-23 NOTE — Telephone Encounter (Signed)
Pt needed earlier appt...done...the patient ok and aware of new d.t.Marland Kitchen..gv new print out for May and June

## 2015-06-23 NOTE — Telephone Encounter (Signed)
Dr. Alvy Bimler aware of Hgb 7.7.  She instructs nurse to contact pt's Dialysis MD/ Center regarding anemia.  She recommends Dialysis MD arrange for pt to have blood transfusion tomorrow with dialysis.   I spoke w/ Danton Clap at [facility] and informed her of above.  Also informed pt starting Chemotherapy in our clinic today.   Faxed lab results and recent office note to them.  Pt has been getting Venofer with HD and will get 100 mg tomorrow.  He also received Mircera 100 mcg yesterday and it is ordered every 2 weeks.

## 2015-06-23 NOTE — Progress Notes (Signed)
Dr. Alvy Bimler ordered OK to treat today with Hgb 7.7 and Creatinine 6.66 on 5/24.  Will notify Dialysis Center/ MD regarding anemia and Dr. Alvy Bimler recommends pt get Transfusion at Dialysis Clinic.  (See separate telephone note).

## 2015-06-23 NOTE — Patient Instructions (Signed)
[facility] Discharge Instructions for Patients Receiving Chemotherapy  Today you received the following chemotherapy agents:  Adriamycin, Vincristine, Cytoxan, RituxanTo help prevent nausea and vomiting after your treatment, we encourage you to take your nausea medication as prescribed.  If you develop nausea and vomiting that is not controlled by your nausea medication, call the clinic.   BELOW ARE SYMPTOMS THAT SHOULD BE REPORTED IMMEDIATELY:  *FEVER GREATER THAN 100.5 F  *CHILLS WITH OR WITHOUT FEVER  NAUSEA AND VOMITING THAT IS NOT CONTROLLED WITH YOUR NAUSEA MEDICATION  *UNUSUAL SHORTNESS OF BREATH  *UNUSUAL BRUISING OR BLEEDING  TENDERNESS IN MOUTH AND THROAT WITH OR WITHOUT PRESENCE OF ULCERS  *URINARY PROBLEMS  *BOWEL PROBLEMS  UNUSUAL RASH Items with * indicate a potential emergency and should be followed up as soon as possible.  Feel free to call the clinic you have any questions or concerns. The clinic phone number is [phone].  Please show the [facility] at check-in to the Emergency Department and triage nurse.     .Rituximab injection What is this medicine? RITUXIMAB (ri TUX i mab) is a monoclonal antibody. It is used commonly to treat non-Hodgkin lymphoma and other conditions. It is also used to treat rheumatoid arthritis (RA). In RA, this medicine slows the inflammatory process and help reduce joint pain and swelling. This medicine is often used with other cancer or arthritis medications. This medicine may be used for other purposes; ask your health care provider or pharmacist if you have questions. What should I tell my health care provider before I take this medicine? They need to know if you have any of these conditions: -blood disorders -heart disease -history of hepatitis B -infection (especially a virus infection such as chickenpox, cold sores, or herpes) -irregular heartbeat -kidney disease -lung or breathing disease,  like asthma -lupus -an unusual or allergic reaction to rituximab, mouse proteins, other medicines, foods, dyes, or preservatives -pregnant or trying to get pregnant -breast-feeding How should I use this medicine? This medicine is for infusion into a vein. It is administered in a hospital or clinic by a specially trained health care professional. A special MedGuide will be given to you by the pharmacist with each prescription and refill. Be sure to read this information carefully each time. Talk to your pediatrician regarding the use of this medicine in children. This medicine is not approved for use in children. Overdosage: If you think you have taken too much of this medicine contact a poison control center or emergency room at once. NOTE: This medicine is only for you. Do not share this medicine with others. What if I miss a dose? It is important not to miss a dose. Call your doctor or health care professional if you are unable to keep an appointment. What may interact with this medicine? -cisplatin -medicines for blood pressure -some other medicines for arthritis -vaccines This list may not describe all possible interactions. Give your health care provider a list of all the medicines, herbs, non-prescription drugs, or dietary supplements you use. Also tell them if you smoke, drink alcohol, or use illegal drugs. Some items may interact with your medicine. What should I watch for while using this medicine? Report any side effects that you notice during your treatment right away, such as changes in your breathing, fever, chills, dizziness or lightheadedness. These effects are more common with the first dose. Visit your prescriber or health care professional for checks on your progress. You will need to have regular  blood work. Report any other side effects. The side effects of this medicine can continue after you finish your treatment. Continue your course of treatment even though you feel ill  unless your doctor tells you to stop. Call your doctor or health care professional for advice if you get a fever, chills or sore throat, or other symptoms of a cold or flu. Do not treat yourself. This drug decreases your body's ability to fight infections. Try to avoid being around people who are sick. This medicine may increase your risk to bruise or bleed. Call your doctor or health care professional if you notice any unusual bleeding. Be careful brushing and flossing your teeth or using a toothpick because you may get an infection or bleed more easily. If you have any dental work done, tell your dentist you are receiving this medicine. Avoid taking products that contain aspirin, acetaminophen, ibuprofen, naproxen, or ketoprofen unless instructed by your doctor. These medicines may hide a fever. Do not become pregnant while taking this medicine. Women should inform their doctor if they wish to become pregnant or think they might be pregnant. There is a potential for serious side effects to an unborn child. Talk to your health care professional or pharmacist for more information. Do not breast-feed an infant while taking this medicine. What side effects may I notice from receiving this medicine? Side effects that you should report to your doctor or health care professional as soon as possible: -allergic reactions like skin rash, itching or hives, swelling of the face, lips, or tongue -low blood counts - this medicine may decrease the number of white blood cells, red blood cells and platelets. You may be at increased risk for infections and bleeding. -signs of infection - fever or chills, cough, sore throat, pain or difficulty passing urine -signs of decreased platelets or bleeding - bruising, pinpoint red spots on the skin, black, tarry stools, blood in the urine -signs of decreased red blood cells - unusually weak or tired, fainting spells, lightheadedness -breathing problems -confused, not  responsive -chest pain -fast, irregular heartbeat -feeling faint or lightheaded, falls -mouth sores -redness, blistering, peeling or loosening of the skin, including inside the mouth -stomach pain -swelling of the ankles, feet, or hands -trouble passing urine or change in the amount of urine Side effects that usually do not require medical attention (report to your doctor or other health care professional if they continue or are bothersome): -anxiety -headache -loss of appetite -muscle aches -nausea -night sweats This list may not describe all possible side effects. Call your doctor for medical advice about side effects. You may report side effects to FDA at 1-800-FDA-1088. Where should I keep my medicine? This drug is given in a hospital or clinic and will not be stored at home. NOTE: This sheet is a summary. It may not cover all possible information. If you have questions about this medicine, talk to your doctor, pharmacist, or health care provider.    2016, Elsevier/Gold Standard. (2014-03-23 22:30:56)    Cyclophosphamide injection What is this medicine? CYCLOPHOSPHAMIDE (sye kloe FOSS fa mide) is a chemotherapy drug. It slows the growth of cancer cells. This medicine is used to treat many types of cancer like lymphoma, myeloma, leukemia, breast cancer, and ovarian cancer, to name a few. This medicine may be used for other purposes; ask your health care provider or pharmacist if you have questions. What should I tell my health care provider before I take this medicine? They need to know if  you have any of these conditions: -blood disorders -history of other chemotherapy -infection -kidney disease -liver disease -recent or ongoing radiation therapy -tumors in the bone marrow -an unusual or allergic reaction to cyclophosphamide, other chemotherapy, other medicines, foods, dyes, or preservatives -pregnant or trying to get pregnant -breast-feeding How should I use this  medicine? This drug is usually given as an injection into a vein or muscle or by infusion into a vein. It is administered in a hospital or clinic by a specially trained health care professional. Talk to your pediatrician regarding the use of this medicine in children. Special care may be needed. Overdosage: If you think you have taken too much of this medicine contact a poison control center or emergency room at once. NOTE: This medicine is only for you. Do not share this medicine with others. What if I miss a dose? It is important not to miss your dose. Call your doctor or health care professional if you are unable to keep an appointment. What may interact with this medicine? This medicine may interact with the following medications: -amiodarone -amphotericin B -azathioprine -certain antiviral medicines for HIV or AIDS such as protease inhibitors (e.g., indinavir, ritonavir) and zidovudine -certain blood pressure medications such as benazepril, captopril, enalapril, fosinopril, lisinopril, moexipril, monopril, perindopril, quinapril, ramipril, trandolapril -certain cancer medications such as anthracyclines (e.g., daunorubicin, doxorubicin), busulfan, cytarabine, paclitaxel, pentostatin, tamoxifen, trastuzumab -certain diuretics such as chlorothiazide, chlorthalidone, hydrochlorothiazide, indapamide, metolazone -certain medicines that treat or prevent blood clots like warfarin -certain muscle relaxants such as succinylcholine -cyclosporine -etanercept -indomethacin -medicines to increase blood counts like filgrastim, pegfilgrastim, sargramostim -medicines used as general anesthesia -metronidazole -natalizumab This list may not describe all possible interactions. Give your health care provider a list of all the medicines, herbs, non-prescription drugs, or dietary supplements you use. Also tell them if you smoke, drink alcohol, or use illegal drugs. Some items may interact with your  medicine. What should I watch for while using this medicine? Visit your doctor for checks on your progress. This drug may make you feel generally unwell. This is not uncommon, as chemotherapy can affect healthy cells as well as cancer cells. Report any side effects. Continue your course of treatment even though you feel ill unless your doctor tells you to stop. Drink water or other fluids as directed. Urinate often, even at night. In some cases, you may be given additional medicines to help with side effects. Follow all directions for their use. Call your doctor or health care professional for advice if you get a fever, chills or sore throat, or other symptoms of a cold or flu. Do not treat yourself. This drug decreases your body's ability to fight infections. Try to avoid being around people who are sick. This medicine may increase your risk to bruise or bleed. Call your doctor or health care professional if you notice any unusual bleeding. Be careful brushing and flossing your teeth or using a toothpick because you may get an infection or bleed more easily. If you have any dental work done, tell your dentist you are receiving this medicine. You may get drowsy or dizzy. Do not drive, use machinery, or do anything that needs mental alertness until you know how this medicine affects you. Do not become pregnant while taking this medicine or for 1 year after stopping it. Women should inform their doctor if they wish to become pregnant or think they might be pregnant. Men should not father a child while taking this medicine and for 4  months after stopping it. There is a potential for serious side effects to an unborn child. Talk to your health care professional or pharmacist for more information. Do not breast-feed an infant while taking this medicine. This medicine may interfere with the ability to have a child. This medicine has caused ovarian failure in some women. This medicine has caused reduced sperm  counts in some men. You should talk with your doctor or health care professional if you are concerned about your fertility. If you are going to have surgery, tell your doctor or health care professional that you have taken this medicine. What side effects may I notice from receiving this medicine? Side effects that you should report to your doctor or health care professional as soon as possible: -allergic reactions like skin rash, itching or hives, swelling of the face, lips, or tongue -low blood counts - this medicine may decrease the number of white blood cells, red blood cells and platelets. You may be at increased risk for infections and bleeding. -signs of infection - fever or chills, cough, sore throat, pain or difficulty passing urine -signs of decreased platelets or bleeding - bruising, pinpoint red spots on the skin, black, tarry stools, blood in the urine -signs of decreased red blood cells - unusually weak or tired, fainting spells, lightheadedness -breathing problems -dark urine -dizziness -palpitations -swelling of the ankles, feet, hands -trouble passing urine or change in the amount of urine -weight gain -yellowing of the eyes or skin Side effects that usually do not require medical attention (report to your doctor or health care professional if they continue or are bothersome): -changes in nail or skin color -hair loss -missed menstrual periods -mouth sores -nausea, vomiting This list may not describe all possible side effects. Call your doctor for medical advice about side effects. You may report side effects to FDA at 1-800-FDA-1088. Where should I keep my medicine? This drug is given in a hospital or clinic and will not be stored at home. NOTE: This sheet is a summary. It may not cover all possible information. If you have questions about this medicine, talk to your doctor, pharmacist, or health care provider.    2016, Elsevier/Gold Standard. (2011-11-29  16:22:58)    Vincristine injection What is this medicine? VINCRISTINE (vin KRIS teen) is a chemotherapy drug. It slows the growth of cancer cells. This medicine is used to treat many types of cancer like Hodgkin's disease, leukemia, non-Hodgkin's lymphoma, neuroblastoma (brain cancer), rhabdomyosarcoma, and Wilms' tumor. This medicine may be used for other purposes; ask your health care provider or pharmacist if you have questions. What should I tell my health care provider before I take this medicine? They need to know if you have any of these conditions: -blood disorders -gout -infection (especially chickenpox, cold sores, or herpes) -kidney disease -liver disease -lung disease -nervous system disease like Charcot-Marie-Tooth (CMT) -recent or ongoing radiation therapy -an unusual or allergic reaction to vincristine, other chemotherapy agents, other medicines, foods, dyes, or preservatives -pregnant or trying to get pregnant -breast-feeding How should I use this medicine? This drug is given as an infusion into a vein. It is administered in a hospital or clinic by a specially trained health care professional. If you have pain, swelling, burning, or any unusual feeling around the site of your injection, tell your health care professional right away. Talk to your pediatrician regarding the use of this medicine in children. While this drug may be prescribed for selected conditions, precautions do apply. Overdosage: If  you think you have taken too much of this medicine contact a poison control center or emergency room at once. NOTE: This medicine is only for you. Do not share this medicine with others. What if I miss a dose? It is important not to miss your dose. Call your doctor or health care professional if you are unable to keep an appointment. What may interact with this medicine? Do not take this medicine with any of the following  medications: -itraconazole -mibefradil -voriconazole This medicine may also interact with the following medications: -cyclosporine -erythromycin -fluconazole -ketoconazole -medicines for HIV like delavirdine, efavirenz, nevirapine -medicines for seizures like ethotoin, fosphenotoin, phenytoin -medicines to increase blood counts like filgrastim, pegfilgrastim, sargramostim -other chemotherapy drugs like cisplatin, L-asparaginase, methotrexate, mitomycin, paclitaxel -pegaspargase -vaccines -zalcitabine, ddC Talk to your doctor or health care professional before taking any of these medicines: -acetaminophen -aspirin -ibuprofen -ketoprofen -naproxen This list may not describe all possible interactions. Give your health care provider a list of all the medicines, herbs, non-prescription drugs, or dietary supplements you use. Also tell them if you smoke, drink alcohol, or use illegal drugs. Some items may interact with your medicine. What should I watch for while using this medicine? Your condition will be monitored carefully while you are receiving this medicine. You will need important blood work done while you are taking this medicine. This drug may make you feel generally unwell. This is not uncommon, as chemotherapy can affect healthy cells as well as cancer cells. Report any side effects. Continue your course of treatment even though you feel ill unless your doctor tells you to stop. In some cases, you may be given additional medicines to help with side effects. Follow all directions for their use. Call your doctor or health care professional for advice if you get a fever, chills or sore throat, or other symptoms of a cold or flu. Do not treat yourself. Avoid taking products that contain aspirin, acetaminophen, ibuprofen, naproxen, or ketoprofen unless instructed by your doctor. These medicines may hide a fever. Do not become pregnant while taking this medicine. Women should inform their  doctor if they wish to become pregnant or think they might be pregnant. There is a potential for serious side effects to an unborn child. Talk to your health care professional or pharmacist for more information. Do not breast-feed an infant while taking this medicine. Men may have a lower sperm count while taking this medicine. Talk to your doctor if you plan to father a child. What side effects may I notice from receiving this medicine? Side effects that you should report to your doctor or health care professional as soon as possible: -allergic reactions like skin rash, itching or hives, swelling of the face, lips, or tongue -breathing problems -confusion or changes in emotions or moods -constipation -cough -mouth sores -muscle weakness -nausea and vomiting -pain, swelling, redness or irritation at the injection site -pain, tingling, numbness in the hands or feet -problems with balance, talking, walking -seizures -stomach pain -trouble passing urine or change in the amount of urine Side effects that usually do not require medical attention (report to your doctor or health care professional if they continue or are bothersome): -diarrhea -hair loss -jaw pain -loss of appetite This list may not describe all possible side effects. Call your doctor for medical advice about side effects. You may report side effects to FDA at 1-800-FDA-1088. Where should I keep my medicine? This drug is given in a hospital or clinic and will not be  stored at home. NOTE: This sheet is a summary. It may not cover all possible information. If you have questions about this medicine, talk to your doctor, pharmacist, or health care provider.    2016, [employer]/Gold Standard. (2007-10-12 17:17:13)    Doxorubicin injection What is this medicine? DOXORUBICIN (dox oh ROO bi sin) is a chemotherapy drug. It is used to treat many kinds of cancer like Hodgkin's disease, leukemia, non-Hodgkin's lymphoma, neuroblastoma,  sarcoma, and Wilms' tumor. It is also used to treat bladder cancer, breast cancer, lung cancer, ovarian cancer, stomach cancer, and thyroid cancer. This medicine may be used for other purposes; ask your health care provider or pharmacist if you have questions. What should I tell my health care provider before I take this medicine? They need to know if you have any of these conditions: -blood disorders -heart disease, recent heart attack -infection (especially a virus infection such as chickenpox, cold sores, or herpes) -irregular heartbeat -liver disease -recent or ongoing radiation therapy -an unusual or allergic reaction to doxorubicin, other chemotherapy agents, other medicines, foods, dyes, or preservatives -pregnant or trying to get pregnant -breast-feeding How should I use this medicine? This drug is given as an infusion into a vein. It is administered in a hospital or clinic by a specially trained health care professional. If you have pain, swelling, burning or any unusual feeling around the site of your injection, tell your health care professional right away. Talk to your pediatrician regarding the use of this medicine in children. Special care may be needed. Overdosage: If you think you have taken too much of this medicine contact a poison control center or emergency room at once. NOTE: This medicine is only for you. Do not share this medicine with others. What if I miss a dose? It is important not to miss your dose. Call your doctor or health care professional if you are unable to keep an appointment. What may interact with this medicine? Do not take this medicine with any of the following medications: -cisapride -droperidol -halofantrine -pimozide -zidovudine This medicine may also interact with the following medications: -chloroquine -chlorpromazine -clarithromycin -cyclophosphamide -cyclosporine -erythromycin -medicines for depression, anxiety, or psychotic  disturbances -medicines for irregular heart beat like amiodarone, bepridil, dofetilide, encainide, flecainide, propafenone, quinidine -medicines for seizures like ethotoin, fosphenytoin, phenytoin -medicines for nausea, vomiting like dolasetron, ondansetron, palonosetron -medicines to increase blood counts like filgrastim, pegfilgrastim, sargramostim -methadone -methotrexate -pentamidine -progesterone -vaccines -verapamil Talk to your doctor or health care professional before taking any of these medicines: -acetaminophen -aspirin -ibuprofen -ketoprofen -naproxen This list may not describe all possible interactions. Give your health care provider a list of all the medicines, herbs, non-prescription drugs, or dietary supplements you use. Also tell them if you smoke, drink alcohol, or use illegal drugs. Some items may interact with your medicine. What should I watch for while using this medicine? Your condition will be monitored carefully while you are receiving this medicine. You will need important blood work done while you are taking this medicine. This drug may make you feel generally unwell. This is not uncommon, as chemotherapy can affect healthy cells as well as cancer cells. Report any side effects. Continue your course of treatment even though you feel ill unless your doctor tells you to stop. Your urine may turn red for a few days after your dose. This is not blood. If your urine is dark or brown, call your doctor. In some cases, you may be given additional medicines to help with side effects.  Follow all directions for their use. Call your doctor or health care professional for advice if you get a fever, chills or sore throat, or other symptoms of a cold or flu. Do not treat yourself. This drug decreases your body's ability to fight infections. Try to avoid being around people who are sick. This medicine may increase your risk to bruise or bleed. Call your doctor or health care  professional if you notice any unusual bleeding. Be careful brushing and flossing your teeth or using a toothpick because you may get an infection or bleed more easily. If you have any dental work done, tell your dentist you are receiving this medicine. Avoid taking products that contain aspirin, acetaminophen, ibuprofen, naproxen, or ketoprofen unless instructed by your doctor. These medicines may hide a fever. Men and women of childbearing age should use effective birth control methods while using taking this medicine. Do not become pregnant while taking this medicine. There is a potential for serious side effects to an unborn child. Talk to your health care professional or pharmacist for more information. Do not breast-feed an infant while taking this medicine. Do not let others touch your urine or other body fluids for 5 days after each treatment with this medicine. Caregivers should wear latex gloves to avoid touching body fluids during this time. There is a maximum amount of this medicine you should receive throughout your life. The amount depends on the medical condition being treated and your overall health. Your doctor will watch how much of this medicine you receive in your lifetime. Tell your doctor if you have taken this medicine before. What side effects may I notice from receiving this medicine? Side effects that you should report to your doctor or health care professional as soon as possible: -allergic reactions like skin rash, itching or hives, swelling of the face, lips, or tongue -low blood counts - this medicine may decrease the number of white blood cells, red blood cells and platelets. You may be at increased risk for infections and bleeding. -signs of infection - fever or chills, cough, sore throat, pain or difficulty passing urine -signs of decreased platelets or bleeding - bruising, pinpoint red spots on the skin, black, tarry stools, blood in the urine -signs of decreased red  blood cells - unusually weak or tired, fainting spells, lightheadedness -breathing problems -chest pain -fast, irregular heartbeat -mouth sores -nausea, vomiting -pain, swelling, redness at site where injected -pain, tingling, numbness in the hands or feet -swelling of ankles, feet, or hands -unusual bleeding or bruising Side effects that usually do not require medical attention (report to your doctor or health care professional if they continue or are bothersome): -diarrhea -facial flushing -hair loss -loss of appetite -missed menstrual periods -nail discoloration or damage -red or watery eyes -red colored urine -stomach upset This list may not describe all possible side effects. Call your doctor for medical advice about side effects. You may report side effects to FDA at 1-800-FDA-1088. Where should I keep my medicine? This drug is given in a hospital or clinic and will not be stored at home. NOTE: This sheet is a summary. It may not cover all possible information. If you have questions about this medicine, talk to your doctor, pharmacist, or health care provider.    2016, Elsevier/Gold Standard. (2012-05-12 09:54:34)

## 2015-06-27 ENCOUNTER — Ambulatory Visit (HOSPITAL_BASED_OUTPATIENT_CLINIC_OR_DEPARTMENT_OTHER): Payer: PRIVATE HEALTH INSURANCE

## 2015-06-27 ENCOUNTER — Ambulatory Visit: Payer: PRIVATE HEALTH INSURANCE

## 2015-06-27 VITALS — BP 191/107 | HR 77 | Temp 97.6°F | Resp 18

## 2015-06-27 DIAGNOSIS — C8338 Diffuse large B-cell lymphoma, lymph nodes of multiple sites: Secondary | ICD-10-CM

## 2015-06-27 DIAGNOSIS — Z5189 Encounter for other specified aftercare: Secondary | ICD-10-CM

## 2015-06-27 MED ORDER — PEGFILGRASTIM INJECTION 6 MG/0.6ML ~~LOC~~
6.0000 mg | PREFILLED_SYRINGE | Freq: Once | SUBCUTANEOUS | Status: AC
  Administered 2015-06-27: 6 mg via SUBCUTANEOUS
  Filled 2015-06-27: qty 0.6

## 2015-06-27 NOTE — Progress Notes (Signed)
Patient states he forgot to take his blood pressure medicine this morning. Patient denies any headaches or dizziness. Patient instructed to take his medicine upon arrival to his home. Patient and spouse verbalized understanding. Patient to call our office with any further questions or concerns.

## 2015-06-27 NOTE — Patient Instructions (Signed)
Pegfilgrastim injection (Neulasta) What is this medicine?  PEGFILGRASTIM (PEG fil gra stim) is a long-acting granulocyte colony-stimulating factor that stimulates the growth of neutrophils, a type of white blood cell important in the body's fight against infection. It is used to reduce the incidence of fever and infection in patients with certain types of cancer who are receiving chemotherapy that affects the bone marrow, and to increase survival after being exposed to high doses of radiation. This medicine may be used for other purposes; ask your health care provider or pharmacist if you have questions. What should I tell my health care provider before I take this medicine? They need to know if you have any of these conditions: -kidney disease -latex allergy -ongoing radiation therapy -sickle cell disease -skin reactions to acrylic adhesives (On-Body Injector only) -an unusual or allergic reaction to pegfilgrastim, filgrastim, other medicines, foods, dyes, or preservatives -pregnant or trying to get pregnant -breast-feeding How should I use this medicine? This medicine is for injection under the skin. If you get this medicine at home, you will be taught how to prepare and give the pre-filled syringe or how to use the On-body Injector. Refer to the patient Instructions for Use for detailed instructions. Use exactly as directed. Take your medicine at regular intervals. Do not take your medicine more often than directed. It is important that you put your used needles and syringes in a special sharps container. Do not put them in a trash can. If you do not have a sharps container, call your pharmacist or healthcare provider to get one. Talk to your pediatrician regarding the use of this medicine in children. While this drug may be prescribed for selected conditions, precautions do apply. Overdosage: If you think you have taken too much of this medicine contact a poison control center or emergency  room at once. NOTE: This medicine is only for you. Do not share this medicine with others. What if I miss a dose? It is important not to miss your dose. Call your doctor or health care professional if you miss your dose. If you miss a dose due to an On-body Injector failure or leakage, a new dose should be administered as soon as possible using a single prefilled syringe for manual use. What may interact with this medicine? Interactions have not been studied. Give your health care provider a list of all the medicines, herbs, non-prescription drugs, or dietary supplements you use. Also tell them if you smoke, drink alcohol, or use illegal drugs. Some items may interact with your medicine. This list may not describe all possible interactions. Give your health care provider a list of all the medicines, herbs, non-prescription drugs, or dietary supplements you use. Also tell them if you smoke, drink alcohol, or use illegal drugs. Some items may interact with your medicine. What should I watch for while using this medicine? You may need blood work done while you are taking this medicine. If you are going to need a MRI, CT scan, or other procedure, tell your doctor that you are using this medicine (On-Body Injector only). What side effects may I notice from receiving this medicine? Side effects that you should report to your doctor or health care professional as soon as possible: -allergic reactions like skin rash, itching or hives, swelling of the face, lips, or tongue -dizziness -fever -pain, redness, or irritation at site where injected -pinpoint red spots on the skin -red or dark-brown urine -shortness of breath or breathing problems -stomach or side pain,  or pain at the shoulder -swelling -tiredness -trouble passing urine or change in the amount of urine Side effects that usually do not require medical attention (report to your doctor or health care professional if they continue or are  bothersome): -bone pain -muscle pain This list may not describe all possible side effects. Call your doctor for medical advice about side effects. You may report side effects to FDA at 1-800-FDA-1088. Where should I keep my medicine? Keep out of the reach of children. Store pre-filled syringes in a refrigerator between 2 and 8 degrees C (36 and 46 degrees F). Do not freeze. Keep in carton to protect from light. Throw away this medicine if it is left out of the refrigerator for more than 48 hours. Throw away any unused medicine after the expiration date. NOTE: This sheet is a summary. It may not cover all possible information. If you have questions about this medicine, talk to your doctor, pharmacist, or health care provider.    2016, Elsevier/Gold Standard. (2014-02-03 14:30:14)

## 2015-07-06 ENCOUNTER — Encounter: Payer: Self-pay | Admitting: Vascular Surgery

## 2015-07-12 ENCOUNTER — Encounter: Payer: Self-pay | Admitting: Vascular Surgery

## 2015-07-12 ENCOUNTER — Ambulatory Visit (INDEPENDENT_AMBULATORY_CARE_PROVIDER_SITE_OTHER): Payer: Medicare Other | Admitting: Vascular Surgery

## 2015-07-12 ENCOUNTER — Ambulatory Visit (HOSPITAL_COMMUNITY)
Admission: RE | Admit: 2015-07-12 | Discharge: 2015-07-12 | Disposition: A | Discharge status: Home / Self Care | Payer: PRIVATE HEALTH INSURANCE | Source: Ambulatory Visit | Attending: Vascular Surgery | Admitting: Vascular Surgery

## 2015-07-12 VITALS — BP 179/99 | HR 62 | Ht 71.0 in | Wt 191.2 lb

## 2015-07-12 DIAGNOSIS — I120 Hypertensive chronic kidney disease with stage 5 chronic kidney disease or end stage renal disease: Secondary | ICD-10-CM | POA: Insufficient documentation

## 2015-07-12 DIAGNOSIS — E1122 Type 2 diabetes mellitus with diabetic chronic kidney disease: Secondary | ICD-10-CM | POA: Diagnosis not present

## 2015-07-12 DIAGNOSIS — N184 Chronic kidney disease, stage 4 (severe): Secondary | ICD-10-CM

## 2015-07-12 DIAGNOSIS — Z992 Dependence on renal dialysis: Secondary | ICD-10-CM | POA: Diagnosis not present

## 2015-07-12 DIAGNOSIS — N186 End stage renal disease: Secondary | ICD-10-CM | POA: Insufficient documentation

## 2015-07-12 NOTE — Progress Notes (Signed)
[facility] [place]  Patient name: Javier Jenkins MRN: [id number] DOB: 1950-11-26 Sex: male  REASON FOR VISIT: Follow up of hemodialysis access.  HPI: Javier Jenkins is a 65 y.o. male who underwent ligation of a left radiocephalic AV fistula, and placement of a left brachiocephalic AV fistula on 123456. I last saw the patient on 02/01/2015. I felt that this could be used for dialysis if it were needed. I felt that that if there were any problems with dialysis then she could have a venogram to look further into the elevated velocities in the central portion of the fistula. He comes in for a follow up visit.   He states that the fistula has been working well and that they have not had any problems with it on dialysis. He denies pain or paresthesias in his left arm.  Past Medical History  Diagnosis Date  . Hypertension   . Stroke [facility])     right side paralysis  . Hypothyroidism   . Diabetes mellitus without complication ([place])     not on medications  . Chronic kidney disease     stage 5  . Constipation   . 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 Non-Hodgkin lymphoma of intra-abdominal lymph nodes ([place]) 06/07/2015  . 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 Hypertension Mother   . Cancer Paternal Uncle     prostate ca    SOCIAL HISTORY: Social History  Substance Use Topics  . Smoking status: Former Smoker    Types: Pipe    Quit date: 09/22/1984  . Smokeless tobacco: Never Used  . Alcohol Use: No    No Known Allergies  Current Outpatient Prescriptions  Medication Sig Dispense Refill  . aspirin 325 MG tablet Take 1 tablet (325 mg total) by mouth daily. 30 tablet 0  . calcium acetate (PHOSLO) 667 MG capsule Take 1 capsule by mouth 2 (two) times daily.   11  . cloNIDine (CATAPRES) 0.2 MG tablet Take 0.4 mg by mouth 2 (two) times daily.     Marland Kitchen docusate sodium  (COLACE) 100 MG capsule Take 1 capsule (100 mg total) by mouth every 12 (twelve) hours. (Patient taking differently: Take 100 mg by mouth every 12 (twelve) hours. ) 60 capsule 0  . doxazosin (CARDURA) 8 MG tablet Take 8 mg by mouth at bedtime.   6  . isosorbide-hydrALAZINE (BIDIL) 20-37.5 MG tablet Take 0.5 tablets by mouth 2 (two) times daily.    Marland Kitchen levETIRAcetam (KEPPRA) 500 MG tablet Take 1 tablet (500 mg total) by mouth 2 (two) times daily. 60 tablet 6  . levothyroxine (SYNTHROID, LEVOTHROID) 25 MCG tablet Take 25 mcg by mouth daily.   0  . lidocaine-prilocaine (EMLA) cream Apply to affected area once 30 g 3  . metoprolol (LOPRESSOR) 100 MG tablet Take 150 mg by mouth 2 (two) times daily.     . multivitamin (RENA-VIT) TABS tablet Take 1 tablet by mouth at bedtime. 90 tablet 0  . ondansetron (ZOFRAN ODT) 8 MG disintegrating tablet Take 1 tablet (8 mg total) by mouth every 8 (eight) hours as needed for nausea or vomiting. 12 tablet 0  . OVER THE COUNTER MEDICATION Take 1 capsule by mouth 2 (two) times daily. "Kidney Factor"    . oxyCODONE-acetaminophen (PERCOCET/ROXICET) 5-325 MG tablet Take 1 tablet by mouth every 4 (four) hours as needed for moderate pain. [place]  tablet 0  . pentafluoroprop-tetrafluoroeth (GEBAUERS) AERO Apply 1 application topically as needed (topical anesthesia for hemodialysis). 30 mL 0  . predniSONE (DELTASONE) 20 MG tablet Take 3 tablets (60 mg total) by mouth daily. Take on days 2-5 of chemotherapy. 12 tablet 6  . prochlorperazine (COMPAZINE) 10 MG tablet Take 1 tablet (10 mg total) by mouth every 6 (six) hours as needed (Nausea or vomiting). 30 tablet 6   No current facility-administered medications for this visit.    REVIEW OF SYSTEMS:  [X]  denotes positive finding, [ ]  denotes negative finding Cardiac  Comments:  Chest pain or chest pressure:    Shortness of breath upon exertion:    Short of breath when lying flat:    Irregular heart rhythm:        Vascular    Pain  in calf, thigh, or hip brought on by ambulation:    Pain in feet at night that wakes you up from your sleep:     Blood clot in your veins:    Leg swelling:         Pulmonary    Oxygen at home:    Productive cough:     Wheezing:         Neurologic    Sudden weakness in arms or legs:     Sudden numbness in arms or legs:     Sudden onset of difficulty speaking or slurred speech:    Temporary loss of vision in one eye:     Problems with dizziness:         Gastrointestinal    Blood in stool:     Vomited blood:         Genitourinary    Burning when urinating:     Blood in urine:        Psychiatric    Major depression:         Hematologic    Bleeding problems:    Problems with blood clotting too easily:        Skin    Rashes or ulcers:        Constitutional    Fever or chills:      PHYSICAL EXAM: Filed Vitals:   07/12/15 1556  BP: 179/99  Pulse: 62  Height: 5\' 11"  (1.803 m)  Weight: 191 lb 3.2 oz (86.728 kg)  SpO2: 97%    GENERAL: The patient is a well-nourished male, in no acute distress. The vital signs are documented above. CARDIAC: There is a regular rate and rhythm.  VASCULAR: he has an excellent thrill in his left upper arm fistula although it is somewhat pulsatile above the antecubital level. He has a palpable left radial pulse. PULMONARY: There is good air exchange bilaterally without wheezing or rales.  DATA:   DUPLEX OF DIALYSIS FISTULA: The diameters of the fistula range from 0.49-0.97 cm. There are some elevated velocities in the central portion of the fistula.  MEDICAL ISSUES:  STAGE V CHRONIC KIDNEY DISEASE: Patient tells me that the fistula has been working well without any problems. Therefore I would continue to use this. If he does have problems with flow during dialysis then he should be considered for a fistulogram as he does have some stenosis in the central portion of the fistula which could likely be addressed with venoplasty. I will see him  back as needed.  [name] [facility] [place] [phone]

## 2015-07-14 ENCOUNTER — Telehealth: Payer: Self-pay | Admitting: Hematology and Oncology

## 2015-07-14 ENCOUNTER — Other Ambulatory Visit (HOSPITAL_BASED_OUTPATIENT_CLINIC_OR_DEPARTMENT_OTHER): Payer: PRIVATE HEALTH INSURANCE

## 2015-07-14 ENCOUNTER — Ambulatory Visit: Payer: PRIVATE HEALTH INSURANCE | Admitting: Nutrition

## 2015-07-14 ENCOUNTER — Ambulatory Visit (HOSPITAL_BASED_OUTPATIENT_CLINIC_OR_DEPARTMENT_OTHER): Payer: PRIVATE HEALTH INSURANCE

## 2015-07-14 ENCOUNTER — Ambulatory Visit (HOSPITAL_BASED_OUTPATIENT_CLINIC_OR_DEPARTMENT_OTHER): Payer: PRIVATE HEALTH INSURANCE | Admitting: Hematology and Oncology

## 2015-07-14 ENCOUNTER — Other Ambulatory Visit: Payer: Self-pay | Admitting: Hematology and Oncology

## 2015-07-14 ENCOUNTER — Encounter: Payer: Self-pay | Admitting: Hematology and Oncology

## 2015-07-14 VITALS — BP 156/95 | HR 59 | Temp 97.9°F | Resp 16

## 2015-07-14 VITALS — BP 153/83 | HR 75 | Temp 98.4°F | Resp 18 | Ht 71.0 in | Wt 193.2 lb

## 2015-07-14 DIAGNOSIS — Z5112 Encounter for antineoplastic immunotherapy: Secondary | ICD-10-CM | POA: Diagnosis not present

## 2015-07-14 DIAGNOSIS — D631 Anemia in chronic kidney disease: Secondary | ICD-10-CM

## 2015-07-14 DIAGNOSIS — C8338 Diffuse large B-cell lymphoma, lymph nodes of multiple sites: Secondary | ICD-10-CM

## 2015-07-14 DIAGNOSIS — N186 End stage renal disease: Secondary | ICD-10-CM | POA: Diagnosis not present

## 2015-07-14 DIAGNOSIS — K1231 Oral mucositis (ulcerative) due to antineoplastic therapy: Secondary | ICD-10-CM

## 2015-07-14 DIAGNOSIS — Z992 Dependence on renal dialysis: Secondary | ICD-10-CM

## 2015-07-14 DIAGNOSIS — Z5111 Encounter for antineoplastic chemotherapy: Secondary | ICD-10-CM

## 2015-07-14 DIAGNOSIS — I10 Essential (primary) hypertension: Secondary | ICD-10-CM

## 2015-07-14 DIAGNOSIS — N189 Chronic kidney disease, unspecified: Secondary | ICD-10-CM

## 2015-07-14 DIAGNOSIS — I69351 Hemiplegia and hemiparesis following cerebral infarction affecting right dominant side: Secondary | ICD-10-CM

## 2015-07-14 HISTORY — DX: Oral mucositis (ulcerative) due to antineoplastic therapy: K12.31

## 2015-07-14 LAB — CBC WITH DIFFERENTIAL/PLATELET
BASO%: 1.1 % (ref 0.0–2.0)
Basophils Absolute: 0.1 10*3/uL (ref 0.0–0.1)
EOS ABS: 0.1 10*3/uL (ref 0.0–0.5)
EOS%: 0.8 % (ref 0.0–7.0)
HCT: 26.5 % — ABNORMAL LOW (ref 38.4–49.9)
HGB: 8.4 g/dL — ABNORMAL LOW (ref 13.0–17.1)
LYMPH%: 7.4 % — AB (ref 14.0–49.0)
MCH: 30 pg (ref 27.2–33.4)
MCHC: 31.6 g/dL — ABNORMAL LOW (ref 32.0–36.0)
MCV: 94.7 fL (ref 79.3–98.0)
MONO#: 0.6 10*3/uL (ref 0.1–0.9)
MONO%: 7.7 % (ref 0.0–14.0)
NEUT%: 83 % — ABNORMAL HIGH (ref 39.0–75.0)
NEUTROS ABS: 6.9 10*3/uL — AB (ref 1.5–6.5)
Platelets: 308 10*3/uL (ref 140–400)
RBC: 2.8 10*6/uL — AB (ref 4.20–5.82)
RDW: 22.4 % — ABNORMAL HIGH (ref 11.0–14.6)
WBC: 8.3 10*3/uL (ref 4.0–10.3)
lymph#: 0.6 10*3/uL — ABNORMAL LOW (ref 0.9–3.3)

## 2015-07-14 LAB — COMPREHENSIVE METABOLIC PANEL
ALT: 10 U/L (ref 0–55)
AST: 14 U/L (ref 5–34)
Albumin: 3 g/dL — ABNORMAL LOW (ref 3.5–5.0)
Alkaline Phosphatase: 79 U/L (ref 40–150)
Anion Gap: 10 mEq/L (ref 3–11)
BILIRUBIN TOTAL: 0.4 mg/dL (ref 0.20–1.20)
BUN: 16.3 mg/dL (ref 7.0–26.0)
CO2: 30 meq/L — AB (ref 22–29)
Calcium: 8.6 mg/dL (ref 8.4–10.4)
Chloride: 97 mEq/L — ABNORMAL LOW (ref 98–109)
Creatinine: 3.9 mg/dL (ref 0.7–1.3)
EGFR: 17 mL/min/{1.73_m2} — AB (ref >90)
GLUCOSE: 150 mg/dL — AB (ref 70–140)
POTASSIUM: 3.4 meq/L — AB (ref 3.5–5.1)
SODIUM: 137 meq/L (ref 136–145)
TOTAL PROTEIN: 6.9 g/dL (ref 6.4–8.3)

## 2015-07-14 MED ORDER — SODIUM CHLORIDE 0.9% FLUSH
10.0000 mL | INTRAVENOUS | Status: DC | PRN
  Administered 2015-07-14: 10 mL
  Filled 2015-07-14: qty 10

## 2015-07-14 MED ORDER — DIPHENHYDRAMINE HCL 25 MG PO CAPS
ORAL_CAPSULE | ORAL | Status: AC
  Filled 2015-07-14: qty 2

## 2015-07-14 MED ORDER — PALONOSETRON HCL INJECTION 0.25 MG/5ML
INTRAVENOUS | Status: AC
  Filled 2015-07-14: qty 5

## 2015-07-14 MED ORDER — SODIUM CHLORIDE 0.9 % IV SOLN
10.0000 mg | Freq: Once | INTRAVENOUS | Status: AC
  Administered 2015-07-14: 10 mg via INTRAVENOUS
  Filled 2015-07-14: qty 1

## 2015-07-14 MED ORDER — DIPHENHYDRAMINE HCL 25 MG PO CAPS
50.0000 mg | ORAL_CAPSULE | Freq: Once | ORAL | Status: AC
  Administered 2015-07-14: 50 mg via ORAL

## 2015-07-14 MED ORDER — DOXORUBICIN HCL CHEMO IV INJECTION 2 MG/ML
25.0000 mg/m2 | Freq: Once | INTRAVENOUS | Status: AC
  Administered 2015-07-14: 52 mg via INTRAVENOUS
  Filled 2015-07-14: qty 26

## 2015-07-14 MED ORDER — ACETAMINOPHEN 325 MG PO TABS
650.0000 mg | ORAL_TABLET | Freq: Once | ORAL | Status: AC
  Administered 2015-07-14: 650 mg via ORAL

## 2015-07-14 MED ORDER — HEPARIN SOD (PORK) LOCK FLUSH 100 UNIT/ML IV SOLN
500.0000 [IU] | Freq: Once | INTRAVENOUS | Status: AC | PRN
  Administered 2015-07-14: 500 [IU]
  Filled 2015-07-14: qty 5

## 2015-07-14 MED ORDER — SODIUM CHLORIDE 0.9 % IV SOLN
375.0000 mg/m2 | Freq: Once | INTRAVENOUS | Status: AC
  Administered 2015-07-14: 800 mg via INTRAVENOUS
  Filled 2015-07-14: qty 70

## 2015-07-14 MED ORDER — SODIUM CHLORIDE 0.9 % IV SOLN
375.0000 mg/m2 | Freq: Once | INTRAVENOUS | Status: AC
  Administered 2015-07-14: 780 mg via INTRAVENOUS
  Filled 2015-07-14: qty 39

## 2015-07-14 MED ORDER — VINCRISTINE SULFATE CHEMO INJECTION 1 MG/ML
1.0000 mg | Freq: Once | INTRAVENOUS | Status: AC
  Administered 2015-07-14: 1 mg via INTRAVENOUS
  Filled 2015-07-14: qty 1

## 2015-07-14 MED ORDER — PALONOSETRON HCL INJECTION 0.25 MG/5ML
0.2500 mg | Freq: Once | INTRAVENOUS | Status: AC
  Administered 2015-07-14: 0.25 mg via INTRAVENOUS

## 2015-07-14 MED ORDER — ACETAMINOPHEN 325 MG PO TABS
ORAL_TABLET | ORAL | Status: AC
  Filled 2015-07-14: qty 2

## 2015-07-14 MED ORDER — SODIUM CHLORIDE 0.9 % IV SOLN
Freq: Once | INTRAVENOUS | Status: AC
  Administered 2015-07-14: 10:00:00 via INTRAVENOUS

## 2015-07-14 NOTE — Assessment & Plan Note (Signed)
he will continue current medical management. I recommend close follow-up with primary care doctor & nephrologist for medication adjustment.

## 2015-07-14 NOTE — Progress Notes (Signed)
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 [facility])   06/07/2015 Initial Diagnosis Non-Hodgkin lymphoma of intra-abdominal lymph nodes ([place])   06/12/2015 Imaging CT chest showed lymphadenopathy within the low neck and low chest, consistent with active lymphoma   06/14/2015 Procedure He underwent CT-guided biopsy   06/14/2015 Pathology Results Biopsy of the retroperitoneal mass confirmed diffuse large B-cell lymphoma   06/21/2015 Imaging ECHO showed normal EF   06/21/2015 Procedure He has port placement   06/23/2015 -  Chemotherapy He received mini-RCHOP chemo    INTERVAL HISTORY: Please see below for problem oriented charting. He is seen prior to cycle 2 of chemotherapy. He tolerated cycle 1 well. The only side effect he had was mucositis but it resolved spontaneously, within a week and it did not impair his ability to eat His wife noticed that the patient appears stronger He denies neuropathy No constipation or nausea Overall, he feels well  REVIEW OF SYSTEMS:   Constitutional: Denies fevers, chills or abnormal weight loss Eyes: Denies blurriness of vision Ears, nose, mouth, throat, and face: Denies mucositis or sore throat Respiratory: Denies cough, dyspnea or wheezes Cardiovascular: Denies palpitation, chest discomfort or lower extremity swelling Gastrointestinal:  Denies nausea, heartburn or change in bowel habits Skin: Denies abnormal skin rashes Lymphatics: Denies new lymphadenopathy or easy bruising Neurological:Denies numbness, tingling or new weaknesses Behavioral/Psych: Mood is stable, no new changes  All other systems were reviewed with the patient and are negative.  I have reviewed  the past medical history, past surgical history, social history and family history with the patient and they are unchanged from previous note.  ALLERGIES:  has No Known Allergies.  MEDICATIONS:  Current Outpatient Prescriptions  Medication Sig Dispense Refill  . aspirin 325 MG tablet Take 1 tablet (325 mg total) by mouth daily. 30 tablet 0  . calcium acetate (PHOSLO) 667 MG capsule Take 1 capsule by mouth 2 (two) times daily.   11  . cloNIDine (CATAPRES) 0.2 MG tablet Take 0.4 mg by mouth 2 (two) times daily.     Marland Kitchen docusate sodium (COLACE) 100 MG capsule Take 1 capsule (100 mg total) by mouth every 12 (twelve) hours. (Patient taking differently: Take 100 mg by mouth every 12 (twelve) hours. ) 60 capsule 0  . isosorbide-hydrALAZINE (BIDIL) 20-37.5 MG tablet Take 0.5 tablets by mouth 2 (two) times daily.    Marland Kitchen levETIRAcetam (KEPPRA) 500 MG tablet Take 1 tablet (500 mg total) by mouth 2 (two) times daily. 60 tablet 6  . levothyroxine (SYNTHROID, LEVOTHROID) 25 MCG tablet Take 25 mcg by mouth daily.   0  . lidocaine-prilocaine (EMLA) cream Apply to affected area once 30 g 3  . metoprolol (LOPRESSOR) 100 MG tablet Take 150 mg by mouth 2 (two) times daily.     . multivitamin (RENA-VIT) TABS tablet Take 1 tablet by mouth at bedtime. 90 tablet 0  . ondansetron (ZOFRAN ODT) 8 MG disintegrating tablet Take 1 tablet (8 mg total) by mouth every 8 (eight) hours as needed for nausea or vomiting. 12 tablet 0  . OVER THE COUNTER MEDICATION Take 1 capsule by mouth 2 (two) times daily. "Kidney Factor"    . oxyCODONE-acetaminophen (PERCOCET/ROXICET) 5-325 MG tablet Take 1 tablet by mouth  every 4 (four) hours as needed for moderate pain. 90 tablet 0  . pentafluoroprop-tetrafluoroeth (GEBAUERS) AERO Apply 1 application topically as needed (topical anesthesia for hemodialysis). 30 mL 0  . predniSONE (DELTASONE) 20 MG tablet Take 3 tablets (60 mg total) by mouth daily. Take on days 2-5 of chemotherapy. 12 tablet 6  .  prochlorperazine (COMPAZINE) 10 MG tablet Take 1 tablet (10 mg total) by mouth every 6 (six) hours as needed (Nausea or vomiting). 30 tablet 6   No current facility-administered medications for this visit.    PHYSICAL EXAMINATION: ECOG PERFORMANCE STATUS: 1 - Symptomatic but completely ambulatory  Filed Vitals:   07/14/15 0835  BP: 153/83  Pulse: 75  Temp: 98.4 F (36.9 C)  Resp: 18   Filed Weights   07/14/15 0835  Weight: 193 lb 3.2 oz (87.635 kg)    GENERAL:alert, no distress and comfortable SKIN: skin color, texture, turgor are normal, no rashes or significant lesions EYES: normal, Conjunctiva are pale and non-injected, sclera clear OROPHARYNX:no exudate, no erythema and lips, buccal mucosa, and tongue normal  NECK: supple, thyroid normal size, non-tender, without nodularity LYMPH:  no palpable lymphadenopathy in the cervical, axillary or inguinal LUNGS: clear to auscultation and percussion with normal breathing effort HEART: regular rate & rhythm and no murmurs and no lower extremity edema ABDOMEN:abdomen soft, non-tender and normal bowel sounds Musculoskeletal:no cyanosis of digits and no clubbing  NEURO: alert & oriented x 3 with fluent speech, with persistent hemiparesis  LABORATORY DATA:  I have reviewed the data as listed    Component Value Date/Time   NA 137 06/21/2015 1345   K 3.7 06/21/2015 1345   CL 95* 06/21/2015 1345   CO2 28 06/21/2015 1345   GLUCOSE 74 06/21/2015 1345   BUN 40* 06/21/2015 1345   CREATININE 6.66* 06/21/2015 1345   CALCIUM 8.8* 06/21/2015 1345   PROT 7.7 06/21/2015 1345   ALBUMIN 3.5 06/21/2015 1345   AST 29 06/21/2015 1345   ALT 21 06/21/2015 1345   ALKPHOS 46 06/21/2015 1345   BILITOT 0.9 06/21/2015 1345   GFRNONAA 8* 06/21/2015 1345   GFRAA 9* 06/21/2015 1345    No results found for: SPEP, UPEP  Lab Results  Component Value Date   WBC 8.3 07/14/2015   NEUTROABS 6.9* 07/14/2015   HGB 8.4* 07/14/2015   HCT 26.5* 07/14/2015    MCV 94.7 07/14/2015   PLT 308 07/14/2015      Chemistry      Component Value Date/Time   NA 137 06/21/2015 1345   K 3.7 06/21/2015 1345   CL 95* 06/21/2015 1345   CO2 28 06/21/2015 1345   BUN 40* 06/21/2015 1345   CREATININE 6.66* 06/21/2015 1345      Component Value Date/Time   CALCIUM 8.8* 06/21/2015 1345   ALKPHOS 46 06/21/2015 1345   AST 29 06/21/2015 1345   ALT 21 06/21/2015 1345   BILITOT 0.9 06/21/2015 1345     ASSESSMENT & PLAN:  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 I will proceed with cycle 2 of treatment with similar dose adjustment. I plan to restage him after cycle 3 of therapy  End stage renal failure on dialysis [facility]) The patient is currently receiving hemodialysis. The cause of his renal failure is due to polycystic kidney disease. We will order dose adjustment as above  Anemia in chronic kidney disease This is likely anemia of chronic disease.  The patient denies recent history of bleeding such as epistaxis, hematuria or hematochezia. He is asymptomatic from the anemia. We will observe for now.  He does not require transfusion now.  I would defer ESA management to nephrologist.  Essential hypertension he will continue current medical management. I recommend close follow-up with primary care doctor & nephrologist for medication adjustment.   Hemiparesis affecting right side as late effect of stroke [facility]) The patient had history of fall 3 months ago with subdural hematoma. Since then, he is able to manage at home without further falls. He continues to use cane whenever he walks.    Mucositis due to chemotherapy He had mild mucositis, resolves spontaneously. It is not severe. Examination is normal. I recommend conservative management with baking soda mixture at home prn   No orders of the defined types were placed in this encounter.   All questions  were answered. The patient knows to call the clinic with any problems, questions or concerns. No barriers to learning was detected. I spent 25 minutes counseling the patient face to face. The total time spent in the appointment was 30 minutes and more than 50% was on counseling and review of test results     [facility], [place], MD 07/14/2015 9:10 AM

## 2015-07-14 NOTE — Assessment & Plan Note (Signed)
The patient had history of fall 3 months ago with subdural hematoma. Since then, he is able to manage at home without further falls. He continues to use cane whenever he walks.

## 2015-07-14 NOTE — Patient Instructions (Signed)
[facility] Discharge Instructions for Patients Receiving Chemotherapy  Today you received the following chemotherapy agents:  Adriamycin, Vincristine, Cytoxan, Rituxan. To help prevent nausea and vomiting after your treatment, we encourage you to take your nausea medication as prescribed.  If you develop nausea and vomiting that is not controlled by your nausea medication, call the clinic.   BELOW ARE SYMPTOMS THAT SHOULD BE REPORTED IMMEDIATELY:  *FEVER GREATER THAN 100.5 F  *CHILLS WITH OR WITHOUT FEVER  NAUSEA AND VOMITING THAT IS NOT CONTROLLED WITH YOUR NAUSEA MEDICATION  *UNUSUAL SHORTNESS OF BREATH  *UNUSUAL BRUISING OR BLEEDING  TENDERNESS IN MOUTH AND THROAT WITH OR WITHOUT PRESENCE OF ULCERS  *URINARY PROBLEMS  *BOWEL PROBLEMS  UNUSUAL RASH Items with * indicate a potential emergency and should be followed up as soon as possible.  Feel free to call the clinic you have any questions or concerns. The clinic phone number is [phone].  Please show the [place] at check-in to the Emergency Department and triage nurse.

## 2015-07-14 NOTE — Assessment & Plan Note (Signed)
This is likely anemia of chronic disease. The patient denies recent history of bleeding such as epistaxis, hematuria or hematochezia. He is asymptomatic from the anemia. We will observe for now.  He does not require transfusion now.  I would defer ESA management to nephrologist. 

## 2015-07-14 NOTE — Assessment & Plan Note (Signed)
The patient is currently receiving hemodialysis. The cause of his renal failure is due to polycystic kidney disease. We will order dose adjustment as above 

## 2015-07-14 NOTE — Telephone Encounter (Signed)
per pof to sch pt appt-sch trmt-pt to get updated copy of avs per pof

## 2015-07-14 NOTE — Assessment & Plan Note (Signed)
He had mild mucositis, resolves spontaneously. It is not severe. Examination is normal. I recommend conservative management with baking soda mixture at home prn

## 2015-07-14 NOTE — Progress Notes (Signed)
65 year old male diagnosed with non-Hodgkin's lymphoma who is a patient of Dr. Alvy Bimler  History includes end-stage renal disease on hemodialysis secondary to polycystic kidney disease, hypertension, stroke, diabetes, and constipation.  Medications include Rena-Vite, prednisone and Compazine.  Labs were reviewed.  Height: 5 feet 11 inches. Weight: 193.2 pounds on June 16. BMI: 26.96.  Patient is followed by registered dietitian at dialysis Center Patient reports he generally follows a fluid restricted, protein-controlled diet. Weight is variable secondary to dialysis but patient denies significant weight loss. Patient has had a little bit of nausea but that was improved with medication. He occasionally has constipation but he has that controlled. Patient really has no questions or concerns about oral intake, during chemotherapy.  Educated patient to continue to follow renal diet and strive for maintenance of lean body mass. Encouraged patient to contact me or dietitian at hemodialysis unit for questions and concerns. No follow-up required.  **Disclaimer: This note was dictated with voice recognition software. Similar sounding words can inadvertently be transcribed and this note may contain transcription errors which may not have been corrected upon publication of note.**

## 2015-07-14 NOTE — Assessment & Plan Note (Signed)
He tolerated cycle 1 of treatment well without major side effects. I will proceed with cycle 2 of treatment with similar dose adjustment. I plan to restage him after cycle 3 of therapy

## 2015-07-14 NOTE — Progress Notes (Signed)
1155: Prior to initiation of Rituxan, vitals obtained and are as charted.  Pt found to be hypertensive and bradycardic.  Pt reports he did not take his BP medication this morning.  Vitals reviewed with Dr. Alvy Bimler who states okay to proceed with treatment.  No new orders at this time.  Will continue to monitor.

## 2015-07-17 ENCOUNTER — Ambulatory Visit: Payer: Medicare Other

## 2015-07-17 ENCOUNTER — Ambulatory Visit (HOSPITAL_BASED_OUTPATIENT_CLINIC_OR_DEPARTMENT_OTHER): Payer: PRIVATE HEALTH INSURANCE

## 2015-07-17 VITALS — BP 168/85 | HR 68 | Temp 98.5°F | Resp 20

## 2015-07-17 DIAGNOSIS — Z5189 Encounter for other specified aftercare: Secondary | ICD-10-CM | POA: Diagnosis not present

## 2015-07-17 DIAGNOSIS — C8338 Diffuse large B-cell lymphoma, lymph nodes of multiple sites: Secondary | ICD-10-CM

## 2015-07-17 MED ORDER — PEGFILGRASTIM INJECTION 6 MG/0.6ML ~~LOC~~
6.0000 mg | PREFILLED_SYRINGE | Freq: Once | SUBCUTANEOUS | Status: AC
  Administered 2015-07-17: 6 mg via SUBCUTANEOUS
  Filled 2015-07-17: qty 0.6

## 2015-07-17 NOTE — Patient Instructions (Signed)
Pegfilgrastim injection (Neulasta) What is this medicine?  PEGFILGRASTIM (PEG fil gra stim) is a long-acting granulocyte colony-stimulating factor that stimulates the growth of neutrophils, a type of white blood cell important in the body's fight against infection. It is used to reduce the incidence of fever and infection in patients with certain types of cancer who are receiving chemotherapy that affects the bone marrow, and to increase survival after being exposed to high doses of radiation. This medicine may be used for other purposes; ask your health care provider or pharmacist if you have questions. What should I tell my health care provider before I take this medicine? They need to know if you have any of these conditions: -kidney disease -latex allergy -ongoing radiation therapy -sickle cell disease -skin reactions to acrylic adhesives (On-Body Injector only) -an unusual or allergic reaction to pegfilgrastim, filgrastim, other medicines, foods, dyes, or preservatives -pregnant or trying to get pregnant -breast-feeding How should I use this medicine? This medicine is for injection under the skin. If you get this medicine at home, you will be taught how to prepare and give the pre-filled syringe or how to use the On-body Injector. Refer to the patient Instructions for Use for detailed instructions. Use exactly as directed. Take your medicine at regular intervals. Do not take your medicine more often than directed. It is important that you put your used needles and syringes in a special sharps container. Do not put them in a trash can. If you do not have a sharps container, call your pharmacist or healthcare provider to get one. Talk to your pediatrician regarding the use of this medicine in children. While this drug may be prescribed for selected conditions, precautions do apply. Overdosage: If you think you have taken too much of this medicine contact a poison control center or emergency  room at once. NOTE: This medicine is only for you. Do not share this medicine with others. What if I miss a dose? It is important not to miss your dose. Call your doctor or health care professional if you miss your dose. If you miss a dose due to an On-body Injector failure or leakage, a new dose should be administered as soon as possible using a single prefilled syringe for manual use. What may interact with this medicine? Interactions have not been studied. Give your health care provider a list of all the medicines, herbs, non-prescription drugs, or dietary supplements you use. Also tell them if you smoke, drink alcohol, or use illegal drugs. Some items may interact with your medicine. This list may not describe all possible interactions. Give your health care provider a list of all the medicines, herbs, non-prescription drugs, or dietary supplements you use. Also tell them if you smoke, drink alcohol, or use illegal drugs. Some items may interact with your medicine. What should I watch for while using this medicine? You may need blood work done while you are taking this medicine. If you are going to need a MRI, CT scan, or other procedure, tell your doctor that you are using this medicine (On-Body Injector only). What side effects may I notice from receiving this medicine? Side effects that you should report to your doctor or health care professional as soon as possible: -allergic reactions like skin rash, itching or hives, swelling of the face, lips, or tongue -dizziness -fever -pain, redness, or irritation at site where injected -pinpoint red spots on the skin -red or dark-brown urine -shortness of breath or breathing problems -stomach or side pain,  or pain at the shoulder -swelling -tiredness -trouble passing urine or change in the amount of urine Side effects that usually do not require medical attention (report to your doctor or health care professional if they continue or are  bothersome): -bone pain -muscle pain This list may not describe all possible side effects. Call your doctor for medical advice about side effects. You may report side effects to FDA at 1-800-FDA-1088. Where should I keep my medicine? Keep out of the reach of children. Store pre-filled syringes in a refrigerator between 2 and 8 degrees C (36 and 46 degrees F). Do not freeze. Keep in carton to protect from light. Throw away this medicine if it is left out of the refrigerator for more than 48 hours. Throw away any unused medicine after the expiration date. NOTE: This sheet is a summary. It may not cover all possible information. If you have questions about this medicine, talk to your doctor, pharmacist, or health care provider.    2016, Elsevier/Gold Standard. (2014-02-03 14:30:14)

## 2015-07-29 DIAGNOSIS — D631 Anemia in chronic kidney disease: Secondary | ICD-10-CM | POA: Diagnosis not present

## 2015-07-29 DIAGNOSIS — E119 Type 2 diabetes mellitus without complications: Secondary | ICD-10-CM | POA: Diagnosis not present

## 2015-07-29 DIAGNOSIS — N186 End stage renal disease: Secondary | ICD-10-CM | POA: Diagnosis not present

## 2015-07-29 DIAGNOSIS — N2581 Secondary hyperparathyroidism of renal origin: Secondary | ICD-10-CM | POA: Diagnosis not present

## 2015-08-01 DIAGNOSIS — D631 Anemia in chronic kidney disease: Secondary | ICD-10-CM | POA: Diagnosis not present

## 2015-08-01 DIAGNOSIS — N2581 Secondary hyperparathyroidism of renal origin: Secondary | ICD-10-CM | POA: Diagnosis not present

## 2015-08-01 DIAGNOSIS — N186 End stage renal disease: Secondary | ICD-10-CM | POA: Diagnosis not present

## 2015-08-01 DIAGNOSIS — E119 Type 2 diabetes mellitus without complications: Secondary | ICD-10-CM | POA: Diagnosis not present

## 2015-08-03 DIAGNOSIS — N186 End stage renal disease: Secondary | ICD-10-CM | POA: Diagnosis not present

## 2015-08-03 DIAGNOSIS — D631 Anemia in chronic kidney disease: Secondary | ICD-10-CM | POA: Diagnosis not present

## 2015-08-03 DIAGNOSIS — E119 Type 2 diabetes mellitus without complications: Secondary | ICD-10-CM | POA: Diagnosis not present

## 2015-08-03 DIAGNOSIS — N2581 Secondary hyperparathyroidism of renal origin: Secondary | ICD-10-CM | POA: Diagnosis not present

## 2015-08-04 ENCOUNTER — Other Ambulatory Visit (HOSPITAL_BASED_OUTPATIENT_CLINIC_OR_DEPARTMENT_OTHER): Payer: Medicare Other

## 2015-08-04 ENCOUNTER — Telehealth: Payer: Self-pay | Admitting: Hematology and Oncology

## 2015-08-04 ENCOUNTER — Encounter: Payer: Self-pay | Admitting: Hematology and Oncology

## 2015-08-04 ENCOUNTER — Ambulatory Visit (HOSPITAL_BASED_OUTPATIENT_CLINIC_OR_DEPARTMENT_OTHER): Payer: Medicare Other

## 2015-08-04 ENCOUNTER — Ambulatory Visit: Payer: PRIVATE HEALTH INSURANCE

## 2015-08-04 ENCOUNTER — Ambulatory Visit (HOSPITAL_BASED_OUTPATIENT_CLINIC_OR_DEPARTMENT_OTHER): Payer: Medicare Other | Admitting: Hematology and Oncology

## 2015-08-04 VITALS — BP 157/97 | HR 59 | Temp 97.5°F | Resp 18

## 2015-08-04 VITALS — BP 137/88 | HR 63 | Temp 98.1°F | Resp 18 | Ht 71.0 in | Wt 187.5 lb

## 2015-08-04 DIAGNOSIS — I69351 Hemiplegia and hemiparesis following cerebral infarction affecting right dominant side: Secondary | ICD-10-CM

## 2015-08-04 DIAGNOSIS — D631 Anemia in chronic kidney disease: Secondary | ICD-10-CM

## 2015-08-04 DIAGNOSIS — N189 Chronic kidney disease, unspecified: Secondary | ICD-10-CM

## 2015-08-04 DIAGNOSIS — C8338 Diffuse large B-cell lymphoma, lymph nodes of multiple sites: Secondary | ICD-10-CM | POA: Diagnosis not present

## 2015-08-04 DIAGNOSIS — Z5112 Encounter for antineoplastic immunotherapy: Secondary | ICD-10-CM

## 2015-08-04 DIAGNOSIS — N186 End stage renal disease: Secondary | ICD-10-CM

## 2015-08-04 DIAGNOSIS — K1231 Oral mucositis (ulcerative) due to antineoplastic therapy: Secondary | ICD-10-CM

## 2015-08-04 DIAGNOSIS — Z992 Dependence on renal dialysis: Secondary | ICD-10-CM

## 2015-08-04 DIAGNOSIS — Z5111 Encounter for antineoplastic chemotherapy: Secondary | ICD-10-CM | POA: Diagnosis not present

## 2015-08-04 DIAGNOSIS — Z95828 Presence of other vascular implants and grafts: Secondary | ICD-10-CM

## 2015-08-04 LAB — CBC WITH DIFFERENTIAL/PLATELET
BASO%: 1.2 % (ref 0.0–2.0)
BASOS ABS: 0.1 10*3/uL (ref 0.0–0.1)
EOS%: 1.8 % (ref 0.0–7.0)
Eosinophils Absolute: 0.2 10*3/uL (ref 0.0–0.5)
HCT: 28.3 % — ABNORMAL LOW (ref 38.4–49.9)
HGB: 8.9 g/dL — ABNORMAL LOW (ref 13.0–17.1)
LYMPH#: 0.7 10*3/uL — AB (ref 0.9–3.3)
LYMPH%: 7.7 % — ABNORMAL LOW (ref 14.0–49.0)
MCH: 30.9 pg (ref 27.2–33.4)
MCHC: 31.5 g/dL — ABNORMAL LOW (ref 32.0–36.0)
MCV: 98.2 fL — ABNORMAL HIGH (ref 79.3–98.0)
MONO#: 0.7 10*3/uL (ref 0.1–0.9)
MONO%: 8.6 % (ref 0.0–14.0)
NEUT#: 6.9 10*3/uL — ABNORMAL HIGH (ref 1.5–6.5)
NEUT%: 80.7 % — AB (ref 39.0–75.0)
Platelets: 307 10*3/uL (ref 140–400)
RBC: 2.88 10*6/uL — AB (ref 4.20–5.82)
RDW: 22.3 % — ABNORMAL HIGH (ref 11.0–14.6)
WBC: 8.5 10*3/uL (ref 4.0–10.3)

## 2015-08-04 LAB — COMPREHENSIVE METABOLIC PANEL
ALT: 11 U/L (ref 0–55)
AST: 15 U/L (ref 5–34)
Albumin: 2.9 g/dL — ABNORMAL LOW (ref 3.5–5.0)
Alkaline Phosphatase: 87 U/L (ref 40–150)
Anion Gap: 11 mEq/L (ref 3–11)
BILIRUBIN TOTAL: 0.31 mg/dL (ref 0.20–1.20)
BUN: 18.7 mg/dL (ref 7.0–26.0)
CHLORIDE: 98 meq/L (ref 98–109)
CO2: 30 meq/L — AB (ref 22–29)
Calcium: 8.6 mg/dL (ref 8.4–10.4)
EGFR: 15 mL/min/{1.73_m2} — AB (ref >90)
GLUCOSE: 182 mg/dL — AB (ref 70–140)
Potassium: 3.4 mEq/L — ABNORMAL LOW (ref 3.5–5.1)
SODIUM: 140 meq/L (ref 136–145)
TOTAL PROTEIN: 6.3 g/dL — AB (ref 6.4–8.3)

## 2015-08-04 MED ORDER — DIPHENHYDRAMINE HCL 25 MG PO CAPS
ORAL_CAPSULE | ORAL | Status: AC
  Filled 2015-08-04: qty 2

## 2015-08-04 MED ORDER — HEPARIN SOD (PORK) LOCK FLUSH 100 UNIT/ML IV SOLN
500.0000 [IU] | Freq: Once | INTRAVENOUS | Status: AC | PRN
  Administered 2015-08-04: 500 [IU]
  Filled 2015-08-04: qty 5

## 2015-08-04 MED ORDER — SODIUM CHLORIDE 0.9 % IV SOLN
375.0000 mg/m2 | Freq: Once | INTRAVENOUS | Status: AC
  Administered 2015-08-04: 780 mg via INTRAVENOUS
  Filled 2015-08-04: qty 39

## 2015-08-04 MED ORDER — SODIUM CHLORIDE 0.9% FLUSH
10.0000 mL | INTRAVENOUS | Status: DC | PRN
  Administered 2015-08-04: 10 mL via INTRAVENOUS
  Filled 2015-08-04: qty 10

## 2015-08-04 MED ORDER — ACETAMINOPHEN 325 MG PO TABS
650.0000 mg | ORAL_TABLET | Freq: Once | ORAL | Status: AC
  Administered 2015-08-04: 650 mg via ORAL

## 2015-08-04 MED ORDER — VINCRISTINE SULFATE CHEMO INJECTION 1 MG/ML
1.0000 mg | Freq: Once | INTRAVENOUS | Status: AC
  Administered 2015-08-04: 1 mg via INTRAVENOUS
  Filled 2015-08-04: qty 1

## 2015-08-04 MED ORDER — ACETAMINOPHEN 325 MG PO TABS
ORAL_TABLET | ORAL | Status: AC
  Filled 2015-08-04: qty 2

## 2015-08-04 MED ORDER — RITUXIMAB CHEMO INJECTION 500 MG/50ML
375.0000 mg/m2 | Freq: Once | INTRAVENOUS | Status: AC
  Administered 2015-08-04: 800 mg via INTRAVENOUS
  Filled 2015-08-04: qty 70

## 2015-08-04 MED ORDER — PALONOSETRON HCL INJECTION 0.25 MG/5ML
0.2500 mg | Freq: Once | INTRAVENOUS | Status: AC
  Administered 2015-08-04: 0.25 mg via INTRAVENOUS

## 2015-08-04 MED ORDER — SODIUM CHLORIDE 0.9 % IV SOLN
Freq: Once | INTRAVENOUS | Status: AC
  Administered 2015-08-04: 11:00:00 via INTRAVENOUS

## 2015-08-04 MED ORDER — DIPHENHYDRAMINE HCL 25 MG PO CAPS
50.0000 mg | ORAL_CAPSULE | Freq: Once | ORAL | Status: AC
  Administered 2015-08-04: 50 mg via ORAL

## 2015-08-04 MED ORDER — SODIUM CHLORIDE 0.9% FLUSH
10.0000 mL | INTRAVENOUS | Status: DC | PRN
  Administered 2015-08-04: 10 mL
  Filled 2015-08-04: qty 10

## 2015-08-04 MED ORDER — PALONOSETRON HCL INJECTION 0.25 MG/5ML
INTRAVENOUS | Status: AC
  Filled 2015-08-04: qty 5

## 2015-08-04 MED ORDER — DEXAMETHASONE SODIUM PHOSPHATE 100 MG/10ML IJ SOLN
10.0000 mg | Freq: Once | INTRAMUSCULAR | Status: AC
  Administered 2015-08-04: 10 mg via INTRAVENOUS
  Filled 2015-08-04: qty 1

## 2015-08-04 MED ORDER — DOXORUBICIN HCL CHEMO IV INJECTION 2 MG/ML
25.0000 mg/m2 | Freq: Once | INTRAVENOUS | Status: AC
  Administered 2015-08-04: 52 mg via INTRAVENOUS
  Filled 2015-08-04: qty 26

## 2015-08-04 NOTE — Telephone Encounter (Signed)
per pof to sch pt appt-sch pt trmt-gave pt copy of av

## 2015-08-04 NOTE — Assessment & Plan Note (Signed)
The patient is currently receiving hemodialysis. The cause of his renal failure is due to polycystic kidney disease. We will order dose adjustment as above 

## 2015-08-04 NOTE — Assessment & Plan Note (Signed)
He had mild mucositis, resolves spontaneously. It is not severe. Examination is normal. I recommend conservative management with baking soda mixture at home prn

## 2015-08-04 NOTE — Patient Instructions (Signed)

## 2015-08-04 NOTE — Assessment & Plan Note (Signed)
The patient had history of fall 3 months ago with subdural hematoma. Since then, he is able to manage at home without further falls. He continues to use cane whenever he walks.

## 2015-08-04 NOTE — Assessment & Plan Note (Signed)
He tolerated treatment well without major side effects. I will proceed with cycle 3 of treatment with similar dose adjustment. I plan to restage him after cycle 3 of therapy

## 2015-08-04 NOTE — Patient Instructions (Signed)
[facility] Discharge Instructions for Patients Receiving Chemotherapy  Today you received the following chemotherapy agents Adriamycin, vincristine, cytoxan. You also received Rituxan immunotherapy. To help prevent nausea and vomiting after your treatment, we encourage you to take your nausea medication {as prescribed. If you develop nausea and vomiting that is not controlled by your nausea medication, call the clinic.   BELOW ARE SYMPTOMS THAT SHOULD BE REPORTED IMMEDIATELY:  *FEVER GREATER THAN 100.5 F  *CHILLS WITH OR WITHOUT FEVER  NAUSEA AND VOMITING THAT IS NOT CONTROLLED WITH YOUR NAUSEA MEDICATION  *UNUSUAL SHORTNESS OF BREATH  *UNUSUAL BRUISING OR BLEEDING  TENDERNESS IN MOUTH AND THROAT WITH OR WITHOUT PRESENCE OF ULCERS  *URINARY PROBLEMS  *BOWEL PROBLEMS  UNUSUAL RASH Items with * indicate a potential emergency and should be followed up as soon as possible.  Feel free to call the clinic you have any questions or concerns. The clinic phone number is [phone].  Please show the [place] at check-in to the Emergency Department and triage nurse.

## 2015-08-04 NOTE — Assessment & Plan Note (Signed)
This is likely anemia of chronic disease. The patient denies recent history of bleeding such as epistaxis, hematuria or hematochezia. He is asymptomatic from the anemia. We will observe for now.  He does not require transfusion now.  I would defer ESA management to nephrologist. 

## 2015-08-04 NOTE — Progress Notes (Signed)
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 [facility])   06/07/2015 Initial Diagnosis Non-Hodgkin lymphoma of intra-abdominal lymph nodes ([place])   06/12/2015 Imaging CT chest showed lymphadenopathy within the low neck and low chest, consistent with active lymphoma   06/14/2015 Procedure He underwent CT-guided biopsy   06/14/2015 Pathology Results Biopsy of the retroperitoneal mass confirmed diffuse large B-cell lymphoma   06/21/2015 Imaging ECHO showed normal EF   06/21/2015 Procedure He has port placement   06/23/2015 -  Chemotherapy He received mini-RCHOP chemo    INTERVAL HISTORY: Please see below for problem oriented charting. He returns prior to cycle 3 of treatment. He had mild mucositis after last treatment but not severe. Appetite stable. He has excellent energy level. Denies recent infection. Denies neuropathy  REVIEW OF SYSTEMS:   Constitutional: Denies fevers, chills or abnormal weight loss Eyes: Denies blurriness of vision Respiratory: Denies cough, dyspnea or wheezes Cardiovascular: Denies palpitation, chest discomfort or lower extremity swelling Gastrointestinal:  Denies nausea, heartburn or change in bowel habits Skin: Denies abnormal skin rashes Lymphatics: Denies new lymphadenopathy or easy bruising Neurological:Denies numbness, tingling or new weaknesses Behavioral/Psych: Mood is stable, no new changes  All other systems were reviewed with the patient and are negative.  I have reviewed the past medical history, past surgical history, social history and family history with the patient and they are unchanged from previous note.  ALLERGIES:  has No Known Allergies.  MEDICATIONS:   Current Outpatient Prescriptions  Medication Sig Dispense Refill  . aspirin 325 MG tablet Take 1 tablet (325 mg total) by mouth daily. 30 tablet 0  . calcium acetate (PHOSLO) 667 MG capsule Take 1 capsule by mouth 2 (two) times daily.   11  . cloNIDine (CATAPRES) 0.2 MG tablet Take 0.4 mg by mouth 2 (two) times daily.     Marland Kitchen docusate sodium (COLACE) 100 MG capsule Take 1 capsule (100 mg total) by mouth every 12 (twelve) hours. (Patient taking differently: Take 100 mg by mouth every 12 (twelve) hours. ) 60 capsule 0  . isosorbide-hydrALAZINE (BIDIL) 20-37.5 MG tablet Take 0.5 tablets by mouth 2 (two) times daily.    Marland Kitchen levETIRAcetam (KEPPRA) 500 MG tablet Take 1 tablet (500 mg total) by mouth 2 (two) times daily. 60 tablet 6  . levothyroxine (SYNTHROID, LEVOTHROID) 25 MCG tablet Take 25 mcg by mouth daily.   0  . lidocaine-prilocaine (EMLA) cream Apply to affected area once 30 g 3  . metoprolol (LOPRESSOR) 100 MG tablet Take 150 mg by mouth 2 (two) times daily.     . multivitamin (RENA-VIT) TABS tablet Take 1 tablet by mouth at bedtime. 90 tablet 0  . OVER THE COUNTER MEDICATION Take 1 capsule by mouth 2 (two) times daily. "Kidney Factor"    . pentafluoroprop-tetrafluoroeth (GEBAUERS) AERO Apply 1 application topically as needed (topical anesthesia for hemodialysis). 30 mL 0  . predniSONE (DELTASONE) 20 MG tablet Take 3 tablets (60 mg total) by mouth daily. Take on days 2-5 of chemotherapy. 12 tablet 6  . ondansetron (ZOFRAN ODT) 8 MG disintegrating tablet Take 1 tablet (8 mg total) by mouth every 8 (eight) hours as needed for nausea or vomiting. (Patient not taking: Reported on 08/04/2015) 12 tablet 0  .  oxyCODONE-acetaminophen (PERCOCET/ROXICET) 5-325 MG tablet Take 1 tablet by mouth every 4 (four) hours as needed for moderate pain. (Patient not taking: Reported on 08/04/2015) 90 tablet 0  . prochlorperazine (COMPAZINE) 10 MG tablet Take 1 tablet (10 mg total) by mouth every 6 (six) hours as needed  (Nausea or vomiting). (Patient not taking: Reported on 08/04/2015) 30 tablet 6   No current facility-administered medications for this visit.   Facility-Administered Medications Ordered in Other Visits  Medication Dose Route Frequency Provider Last Rate Last Dose  . heparin lock flush 100 unit/mL  500 Units Intracatheter Once PRN Heath Lark, MD      . sodium chloride flush (NS) 0.9 % injection 10 mL  10 mL Intracatheter PRN Heath Lark, MD        PHYSICAL EXAMINATION: ECOG PERFORMANCE STATUS: 0 - Asymptomatic  Filed Vitals:   08/04/15 0947  BP: 137/88  Pulse: 63  Temp: 98.1 F (36.7 C)  Resp: 18   Filed Weights   08/04/15 0947  Weight: 187 lb 8 oz (85.049 kg)    GENERAL:alert, no distress and comfortable SKIN: skin color, texture, turgor are normal, no rashes or significant lesions EYES: normal, Conjunctiva are pink and non-injected, sclera clear OROPHARYNX:no exudate, no erythema and lips, buccal mucosa, and tongue normal  NECK: supple, thyroid normal size, non-tender, without nodularity LYMPH:  no palpable lymphadenopathy in the cervical, axillary or inguinal LUNGS: clear to auscultation and percussion with normal breathing effort HEART: regular rate & rhythm and no murmurs and no lower extremity edema ABDOMEN:abdomen soft, non-tender and normal bowel sounds Musculoskeletal:no cyanosis of digits and no clubbing  NEURO: alert & oriented x 3 with fluent speech, With right-sided hemiparesis  LABORATORY DATA:  I have reviewed the data as listed    Component Value Date/Time   NA 140 08/04/2015 0917   NA 137 06/21/2015 1345   K 3.4* 08/04/2015 0917   K 3.7 06/21/2015 1345   CL 95* 06/21/2015 1345   CO2 30* 08/04/2015 0917   CO2 28 06/21/2015 1345   GLUCOSE 182* 08/04/2015 0917   GLUCOSE 74 06/21/2015 1345   BUN 18.7 08/04/2015 0917   BUN 40* 06/21/2015 1345   CREATININE 4.4 Repeated and Verified* 08/04/2015 0917   CREATININE 6.66* 06/21/2015 1345   CALCIUM 8.6  08/04/2015 0917   CALCIUM 8.8* 06/21/2015 1345   PROT 6.3* 08/04/2015 0917   PROT 7.7 06/21/2015 1345   ALBUMIN 2.9* 08/04/2015 0917   ALBUMIN 3.5 06/21/2015 1345   AST 15 08/04/2015 0917   AST 29 06/21/2015 1345   ALT 11 08/04/2015 0917   ALT 21 06/21/2015 1345   ALKPHOS 87 08/04/2015 0917   ALKPHOS 46 06/21/2015 1345   BILITOT 0.31 08/04/2015 0917   BILITOT 0.9 06/21/2015 1345   GFRNONAA 8* 06/21/2015 1345   GFRAA 9* 06/21/2015 1345    No results found for: SPEP, UPEP  Lab Results  Component Value Date   WBC 8.5 08/04/2015   NEUTROABS 6.9* 08/04/2015   HGB 8.9* 08/04/2015   HCT 28.3* 08/04/2015   MCV 98.2* 08/04/2015   PLT 307 08/04/2015      Chemistry      Component Value Date/Time   NA 140 08/04/2015 0917   NA 137 06/21/2015 1345   K 3.4* 08/04/2015 0917   K 3.7 06/21/2015 1345   CL 95* 06/21/2015 1345   CO2 30* 08/04/2015 0917   CO2 28 06/21/2015 1345   BUN 18.7 08/04/2015 0917   BUN 40* 06/21/2015 1345  CREATININE 4.4 Repeated and Verified* 08/04/2015 0917   CREATININE 6.66* 06/21/2015 1345      Component Value Date/Time   CALCIUM 8.6 08/04/2015 0917   CALCIUM 8.8* 06/21/2015 1345   ALKPHOS 87 08/04/2015 0917   ALKPHOS 46 06/21/2015 1345   AST 15 08/04/2015 0917   AST 29 06/21/2015 1345   ALT 11 08/04/2015 0917   ALT 21 06/21/2015 1345   BILITOT 0.31 08/04/2015 0917   BILITOT 0.9 06/21/2015 1345      ASSESSMENT & PLAN:  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 I will proceed with cycle 3 of treatment with similar dose adjustment. I plan to restage him after cycle 3 of therapy    End stage renal failure on dialysis [facility]) The patient is currently receiving hemodialysis. The cause of his renal failure is due to polycystic kidney disease. We will order dose adjustment as above  Anemia in chronic kidney disease This is likely anemia of chronic disease. The  patient denies recent history of bleeding such as epistaxis, hematuria or hematochezia. He is asymptomatic from the anemia. We will observe for now.  He does not require transfusion now.  I would defer ESA management to nephrologist.  Hemiparesis affecting right side as late effect of stroke [facility]) The patient had history of fall 3 months ago with subdural hematoma. Since then, he is able to manage at home without further falls. He continues to use cane whenever he walks.    Mucositis due to chemotherapy He had mild mucositis, resolves spontaneously. It is not severe. Examination is normal. I recommend conservative management with baking soda mixture at home prn     No orders of the defined types were placed in this encounter.   All questions were answered. The patient knows to call the clinic with any problems, questions or concerns. No barriers to learning was detected. I spent 20 minutes counseling the patient face to face. The total time spent in the appointment was 30 minutes and more than 50% was on counseling and review of test results     [facility], [place], MD 08/04/2015 2:08 PM

## 2015-08-05 DIAGNOSIS — D631 Anemia in chronic kidney disease: Secondary | ICD-10-CM | POA: Diagnosis not present

## 2015-08-05 DIAGNOSIS — E119 Type 2 diabetes mellitus without complications: Secondary | ICD-10-CM | POA: Diagnosis not present

## 2015-08-05 DIAGNOSIS — N2581 Secondary hyperparathyroidism of renal origin: Secondary | ICD-10-CM | POA: Diagnosis not present

## 2015-08-05 DIAGNOSIS — N186 End stage renal disease: Secondary | ICD-10-CM | POA: Diagnosis not present

## 2015-08-07 ENCOUNTER — Ambulatory Visit (HOSPITAL_BASED_OUTPATIENT_CLINIC_OR_DEPARTMENT_OTHER): Payer: Medicare Other

## 2015-08-07 VITALS — BP 170/90 | HR 71 | Temp 98.2°F | Resp 18

## 2015-08-07 DIAGNOSIS — C8338 Diffuse large B-cell lymphoma, lymph nodes of multiple sites: Secondary | ICD-10-CM

## 2015-08-07 DIAGNOSIS — Z5189 Encounter for other specified aftercare: Secondary | ICD-10-CM

## 2015-08-07 MED ORDER — PEGFILGRASTIM INJECTION 6 MG/0.6ML ~~LOC~~
6.0000 mg | PREFILLED_SYRINGE | Freq: Once | SUBCUTANEOUS | Status: AC
  Administered 2015-08-07: 6 mg via SUBCUTANEOUS
  Filled 2015-08-07: qty 0.6

## 2015-08-07 NOTE — Patient Instructions (Signed)
Pegfilgrastim injection (Neulasta) What is this medicine?  PEGFILGRASTIM (PEG fil gra stim) is a long-acting granulocyte colony-stimulating factor that stimulates the growth of neutrophils, a type of white blood cell important in the body's fight against infection. It is used to reduce the incidence of fever and infection in patients with certain types of cancer who are receiving chemotherapy that affects the bone marrow, and to increase survival after being exposed to high doses of radiation. This medicine may be used for other purposes; ask your health care provider or pharmacist if you have questions. What should I tell my health care provider before I take this medicine? They need to know if you have any of these conditions: -kidney disease -latex allergy -ongoing radiation therapy -sickle cell disease -skin reactions to acrylic adhesives (On-Body Injector only) -an unusual or allergic reaction to pegfilgrastim, filgrastim, other medicines, foods, dyes, or preservatives -pregnant or trying to get pregnant -breast-feeding How should I use this medicine? This medicine is for injection under the skin. If you get this medicine at home, you will be taught how to prepare and give the pre-filled syringe or how to use the On-body Injector. Refer to the patient Instructions for Use for detailed instructions. Use exactly as directed. Take your medicine at regular intervals. Do not take your medicine more often than directed. It is important that you put your used needles and syringes in a special sharps container. Do not put them in a trash can. If you do not have a sharps container, call your pharmacist or healthcare provider to get one. Talk to your pediatrician regarding the use of this medicine in children. While this drug may be prescribed for selected conditions, precautions do apply. Overdosage: If you think you have taken too much of this medicine contact a poison control center or emergency  room at once. NOTE: This medicine is only for you. Do not share this medicine with others. What if I miss a dose? It is important not to miss your dose. Call your doctor or health care professional if you miss your dose. If you miss a dose due to an On-body Injector failure or leakage, a new dose should be administered as soon as possible using a single prefilled syringe for manual use. What may interact with this medicine? Interactions have not been studied. Give your health care provider a list of all the medicines, herbs, non-prescription drugs, or dietary supplements you use. Also tell them if you smoke, drink alcohol, or use illegal drugs. Some items may interact with your medicine. This list may not describe all possible interactions. Give your health care provider a list of all the medicines, herbs, non-prescription drugs, or dietary supplements you use. Also tell them if you smoke, drink alcohol, or use illegal drugs. Some items may interact with your medicine. What should I watch for while using this medicine? You may need blood work done while you are taking this medicine. If you are going to need a MRI, CT scan, or other procedure, tell your doctor that you are using this medicine (On-Body Injector only). What side effects may I notice from receiving this medicine? Side effects that you should report to your doctor or health care professional as soon as possible: -allergic reactions like skin rash, itching or hives, swelling of the face, lips, or tongue -dizziness -fever -pain, redness, or irritation at site where injected -pinpoint red spots on the skin -red or dark-brown urine -shortness of breath or breathing problems -stomach or side pain,  or pain at the shoulder -swelling -tiredness -trouble passing urine or change in the amount of urine Side effects that usually do not require medical attention (report to your doctor or health care professional if they continue or are  bothersome): -bone pain -muscle pain This list may not describe all possible side effects. Call your doctor for medical advice about side effects. You may report side effects to FDA at 1-800-FDA-1088. Where should I keep my medicine? Keep out of the reach of children. Store pre-filled syringes in a refrigerator between 2 and 8 degrees C (36 and 46 degrees F). Do not freeze. Keep in carton to protect from light. Throw away this medicine if it is left out of the refrigerator for more than 48 hours. Throw away any unused medicine after the expiration date. NOTE: This sheet is a summary. It may not cover all possible information. If you have questions about this medicine, talk to your doctor, pharmacist, or health care provider.    2016, Elsevier/Gold Standard. (2014-02-03 14:30:14)

## 2015-08-08 DIAGNOSIS — D631 Anemia in chronic kidney disease: Secondary | ICD-10-CM | POA: Diagnosis not present

## 2015-08-08 DIAGNOSIS — E119 Type 2 diabetes mellitus without complications: Secondary | ICD-10-CM | POA: Diagnosis not present

## 2015-08-08 DIAGNOSIS — N186 End stage renal disease: Secondary | ICD-10-CM | POA: Diagnosis not present

## 2015-08-08 DIAGNOSIS — N2581 Secondary hyperparathyroidism of renal origin: Secondary | ICD-10-CM | POA: Diagnosis not present

## 2015-08-10 DIAGNOSIS — N2581 Secondary hyperparathyroidism of renal origin: Secondary | ICD-10-CM | POA: Diagnosis not present

## 2015-08-10 DIAGNOSIS — D631 Anemia in chronic kidney disease: Secondary | ICD-10-CM | POA: Diagnosis not present

## 2015-08-10 DIAGNOSIS — N186 End stage renal disease: Secondary | ICD-10-CM | POA: Diagnosis not present

## 2015-08-10 DIAGNOSIS — E119 Type 2 diabetes mellitus without complications: Secondary | ICD-10-CM | POA: Diagnosis not present

## 2015-08-12 DIAGNOSIS — D631 Anemia in chronic kidney disease: Secondary | ICD-10-CM | POA: Diagnosis not present

## 2015-08-12 DIAGNOSIS — N186 End stage renal disease: Secondary | ICD-10-CM | POA: Diagnosis not present

## 2015-08-12 DIAGNOSIS — E119 Type 2 diabetes mellitus without complications: Secondary | ICD-10-CM | POA: Diagnosis not present

## 2015-08-12 DIAGNOSIS — N2581 Secondary hyperparathyroidism of renal origin: Secondary | ICD-10-CM | POA: Diagnosis not present

## 2015-08-15 DIAGNOSIS — N2581 Secondary hyperparathyroidism of renal origin: Secondary | ICD-10-CM | POA: Diagnosis not present

## 2015-08-15 DIAGNOSIS — N186 End stage renal disease: Secondary | ICD-10-CM | POA: Diagnosis not present

## 2015-08-15 DIAGNOSIS — D631 Anemia in chronic kidney disease: Secondary | ICD-10-CM | POA: Diagnosis not present

## 2015-08-15 DIAGNOSIS — E119 Type 2 diabetes mellitus without complications: Secondary | ICD-10-CM | POA: Diagnosis not present

## 2015-08-17 ENCOUNTER — Other Ambulatory Visit: Payer: Self-pay | Admitting: Hematology and Oncology

## 2015-08-17 ENCOUNTER — Telehealth: Payer: Self-pay | Admitting: *Deleted

## 2015-08-17 DIAGNOSIS — C8338 Diffuse large B-cell lymphoma, lymph nodes of multiple sites: Secondary | ICD-10-CM

## 2015-08-17 DIAGNOSIS — D631 Anemia in chronic kidney disease: Secondary | ICD-10-CM | POA: Diagnosis not present

## 2015-08-17 DIAGNOSIS — E119 Type 2 diabetes mellitus without complications: Secondary | ICD-10-CM | POA: Diagnosis not present

## 2015-08-17 DIAGNOSIS — N186 End stage renal disease: Secondary | ICD-10-CM | POA: Diagnosis not present

## 2015-08-17 DIAGNOSIS — N2581 Secondary hyperparathyroidism of renal origin: Secondary | ICD-10-CM | POA: Diagnosis not present

## 2015-08-17 NOTE — Telephone Encounter (Signed)
PET scheduled for 7/28 at 1 pm.   Notified pt's wife to arrive at [facility] at 12:30 pm for 1 pm appt..  Nothing to eat or drink after 7 am (6hrs prior to exam) that day.   Wife verbalized understanding.

## 2015-08-17 NOTE — Telephone Encounter (Signed)
I placed order now Please call [facility] and schedule

## 2015-08-17 NOTE — Telephone Encounter (Signed)
Wife left VM states pt has not been scheduled for a "scan" yet and they were told to call us if they didn't hear anything in a week.

## 2015-08-19 DIAGNOSIS — N2581 Secondary hyperparathyroidism of renal origin: Secondary | ICD-10-CM | POA: Diagnosis not present

## 2015-08-19 DIAGNOSIS — E119 Type 2 diabetes mellitus without complications: Secondary | ICD-10-CM | POA: Diagnosis not present

## 2015-08-19 DIAGNOSIS — D631 Anemia in chronic kidney disease: Secondary | ICD-10-CM | POA: Diagnosis not present

## 2015-08-19 DIAGNOSIS — N186 End stage renal disease: Secondary | ICD-10-CM | POA: Diagnosis not present

## 2015-08-22 DIAGNOSIS — N186 End stage renal disease: Secondary | ICD-10-CM | POA: Diagnosis not present

## 2015-08-22 DIAGNOSIS — E119 Type 2 diabetes mellitus without complications: Secondary | ICD-10-CM | POA: Diagnosis not present

## 2015-08-22 DIAGNOSIS — D631 Anemia in chronic kidney disease: Secondary | ICD-10-CM | POA: Diagnosis not present

## 2015-08-22 DIAGNOSIS — N2581 Secondary hyperparathyroidism of renal origin: Secondary | ICD-10-CM | POA: Diagnosis not present

## 2015-08-24 ENCOUNTER — Other Ambulatory Visit: Payer: Self-pay | Admitting: Hematology and Oncology

## 2015-08-24 DIAGNOSIS — N186 End stage renal disease: Secondary | ICD-10-CM | POA: Diagnosis not present

## 2015-08-24 DIAGNOSIS — C8338 Diffuse large B-cell lymphoma, lymph nodes of multiple sites: Secondary | ICD-10-CM

## 2015-08-24 DIAGNOSIS — E119 Type 2 diabetes mellitus without complications: Secondary | ICD-10-CM | POA: Diagnosis not present

## 2015-08-24 DIAGNOSIS — D631 Anemia in chronic kidney disease: Secondary | ICD-10-CM | POA: Diagnosis not present

## 2015-08-24 DIAGNOSIS — N2581 Secondary hyperparathyroidism of renal origin: Secondary | ICD-10-CM | POA: Diagnosis not present

## 2015-08-25 ENCOUNTER — Other Ambulatory Visit (HOSPITAL_BASED_OUTPATIENT_CLINIC_OR_DEPARTMENT_OTHER): Payer: Medicare Other

## 2015-08-25 ENCOUNTER — Ambulatory Visit (HOSPITAL_BASED_OUTPATIENT_CLINIC_OR_DEPARTMENT_OTHER): Payer: Medicare Other

## 2015-08-25 ENCOUNTER — Ambulatory Visit (HOSPITAL_COMMUNITY)
Admission: RE | Admit: 2015-08-25 | Discharge: 2015-08-25 | Disposition: A | Discharge status: Home / Self Care | Payer: Medicare Other | Source: Ambulatory Visit | Attending: Hematology and Oncology | Admitting: Hematology and Oncology

## 2015-08-25 VITALS — BP 147/90 | HR 65 | Temp 97.8°F | Resp 18

## 2015-08-25 DIAGNOSIS — C8338 Diffuse large B-cell lymphoma, lymph nodes of multiple sites: Secondary | ICD-10-CM

## 2015-08-25 DIAGNOSIS — C8590 Non-Hodgkin lymphoma, unspecified, unspecified site: Secondary | ICD-10-CM | POA: Diagnosis not present

## 2015-08-25 DIAGNOSIS — Z95828 Presence of other vascular implants and grafts: Secondary | ICD-10-CM

## 2015-08-25 LAB — COMPREHENSIVE METABOLIC PANEL
ALBUMIN: 3.3 g/dL — AB (ref 3.5–5.0)
ALK PHOS: 96 U/L (ref 40–150)
ALT: 16 U/L (ref 0–55)
ANION GAP: 13 meq/L — AB (ref 3–11)
AST: 25 U/L (ref 5–34)
BILIRUBIN TOTAL: 0.35 mg/dL (ref 0.20–1.20)
BUN: 28.7 mg/dL — ABNORMAL HIGH (ref 7.0–26.0)
CO2: 27 mEq/L (ref 22–29)
CREATININE: 5.4 mg/dL — AB (ref 0.7–1.3)
Calcium: 9 mg/dL (ref 8.4–10.4)
Chloride: 100 mEq/L (ref 98–109)
EGFR: 12 mL/min/{1.73_m2} — AB (ref >90)
GLUCOSE: 171 mg/dL — AB (ref 70–140)
Potassium: 4 mEq/L (ref 3.5–5.1)
SODIUM: 140 meq/L (ref 136–145)
TOTAL PROTEIN: 6.9 g/dL (ref 6.4–8.3)

## 2015-08-25 LAB — CBC WITH DIFFERENTIAL/PLATELET
BASO%: 1 % (ref 0.0–2.0)
BASOS ABS: 0.1 10*3/uL (ref 0.0–0.1)
EOS ABS: 0.1 10*3/uL (ref 0.0–0.5)
EOS%: 1.5 % (ref 0.0–7.0)
HCT: 37.7 % — ABNORMAL LOW (ref 38.4–49.9)
HGB: 11.6 g/dL — ABNORMAL LOW (ref 13.0–17.1)
LYMPH%: 10.9 % — AB (ref 14.0–49.0)
MCH: 31.1 pg (ref 27.2–33.4)
MCHC: 30.8 g/dL — AB (ref 32.0–36.0)
MCV: 101.1 fL — AB (ref 79.3–98.0)
MONO#: 0.5 10*3/uL (ref 0.1–0.9)
MONO%: 6.5 % (ref 0.0–14.0)
NEUT%: 80.1 % — AB (ref 39.0–75.0)
NEUTROS ABS: 5.8 10*3/uL (ref 1.5–6.5)
PLATELETS: 208 10*3/uL (ref 140–400)
RBC: 3.73 10*6/uL — AB (ref 4.20–5.82)
RDW: 20.2 % — ABNORMAL HIGH (ref 11.0–14.6)
WBC: 7.3 10*3/uL (ref 4.0–10.3)
lymph#: 0.8 10*3/uL — ABNORMAL LOW (ref 0.9–3.3)

## 2015-08-25 LAB — GLUCOSE, CAPILLARY: GLUCOSE-CAPILLARY: 59 mg/dL — AB (ref 65–99)

## 2015-08-25 MED ORDER — SODIUM CHLORIDE 0.9 % IJ SOLN
10.0000 mL | INTRAMUSCULAR | Status: DC | PRN
  Administered 2015-08-25: 10 mL via INTRAVENOUS
  Filled 2015-08-25: qty 10

## 2015-08-25 MED ORDER — FLUDEOXYGLUCOSE F - 18 (FDG) INJECTION: 10.2000 | Freq: Once | INTRAVENOUS | Status: DC | PRN

## 2015-08-25 MED ORDER — HEPARIN SOD (PORK) LOCK FLUSH 100 UNIT/ML IV SOLN
500.0000 [IU] | Freq: Once | INTRAVENOUS | Status: AC | PRN
  Administered 2015-08-25: 500 [IU] via INTRAVENOUS
  Filled 2015-08-25: qty 5

## 2015-08-25 NOTE — Patient Instructions (Signed)

## 2015-08-26 DIAGNOSIS — N2581 Secondary hyperparathyroidism of renal origin: Secondary | ICD-10-CM | POA: Diagnosis not present

## 2015-08-26 DIAGNOSIS — E119 Type 2 diabetes mellitus without complications: Secondary | ICD-10-CM | POA: Diagnosis not present

## 2015-08-26 DIAGNOSIS — N186 End stage renal disease: Secondary | ICD-10-CM | POA: Diagnosis not present

## 2015-08-26 DIAGNOSIS — D631 Anemia in chronic kidney disease: Secondary | ICD-10-CM | POA: Diagnosis not present

## 2015-08-28 ENCOUNTER — Ambulatory Visit (HOSPITAL_BASED_OUTPATIENT_CLINIC_OR_DEPARTMENT_OTHER): Payer: Medicare Other | Admitting: Hematology and Oncology

## 2015-08-28 ENCOUNTER — Encounter: Payer: Self-pay | Admitting: Hematology and Oncology

## 2015-08-28 ENCOUNTER — Ambulatory Visit (HOSPITAL_BASED_OUTPATIENT_CLINIC_OR_DEPARTMENT_OTHER): Payer: Medicare Other

## 2015-08-28 ENCOUNTER — Telehealth: Payer: Self-pay | Admitting: Hematology and Oncology

## 2015-08-28 VITALS — BP 158/93 | HR 66 | Temp 97.7°F | Resp 20

## 2015-08-28 DIAGNOSIS — C8338 Diffuse large B-cell lymphoma, lymph nodes of multiple sites: Secondary | ICD-10-CM

## 2015-08-28 DIAGNOSIS — Z5111 Encounter for antineoplastic chemotherapy: Secondary | ICD-10-CM | POA: Diagnosis present

## 2015-08-28 DIAGNOSIS — Z5112 Encounter for antineoplastic immunotherapy: Secondary | ICD-10-CM

## 2015-08-28 DIAGNOSIS — N186 End stage renal disease: Secondary | ICD-10-CM | POA: Diagnosis not present

## 2015-08-28 DIAGNOSIS — Z992 Dependence on renal dialysis: Secondary | ICD-10-CM

## 2015-08-28 DIAGNOSIS — D631 Anemia in chronic kidney disease: Secondary | ICD-10-CM

## 2015-08-28 DIAGNOSIS — I129 Hypertensive chronic kidney disease with stage 1 through stage 4 chronic kidney disease, or unspecified chronic kidney disease: Secondary | ICD-10-CM | POA: Diagnosis not present

## 2015-08-28 DIAGNOSIS — I10 Essential (primary) hypertension: Secondary | ICD-10-CM

## 2015-08-28 DIAGNOSIS — I69351 Hemiplegia and hemiparesis following cerebral infarction affecting right dominant side: Secondary | ICD-10-CM | POA: Diagnosis not present

## 2015-08-28 DIAGNOSIS — N189 Chronic kidney disease, unspecified: Secondary | ICD-10-CM

## 2015-08-28 MED ORDER — SODIUM CHLORIDE 0.9% FLUSH
10.0000 mL | INTRAVENOUS | Status: DC | PRN
  Administered 2015-08-28: 10 mL
  Filled 2015-08-28: qty 10

## 2015-08-28 MED ORDER — SODIUM CHLORIDE 0.9 % IV SOLN
375.0000 mg/m2 | Freq: Once | INTRAVENOUS | Status: AC
  Administered 2015-08-28: 780 mg via INTRAVENOUS
  Filled 2015-08-28: qty 39

## 2015-08-28 MED ORDER — PALONOSETRON HCL INJECTION 0.25 MG/5ML
0.2500 mg | Freq: Once | INTRAVENOUS | Status: AC
Start: 2015-08-28 — End: 2015-08-28
  Administered 2015-08-28: 0.25 mg via INTRAVENOUS

## 2015-08-28 MED ORDER — DOXORUBICIN HCL CHEMO IV INJECTION 2 MG/ML
25.0000 mg/m2 | Freq: Once | INTRAVENOUS | Status: AC
  Administered 2015-08-28: 52 mg via INTRAVENOUS
  Filled 2015-08-28: qty 26

## 2015-08-28 MED ORDER — PALONOSETRON HCL INJECTION 0.25 MG/5ML
INTRAVENOUS | Status: AC
  Filled 2015-08-28: qty 5

## 2015-08-28 MED ORDER — HEPARIN SOD (PORK) LOCK FLUSH 100 UNIT/ML IV SOLN
500.0000 [IU] | Freq: Once | INTRAVENOUS | Status: AC | PRN
  Administered 2015-08-28: 500 [IU]
  Filled 2015-08-28: qty 5

## 2015-08-28 MED ORDER — ACETAMINOPHEN 325 MG PO TABS
ORAL_TABLET | ORAL | Status: AC
  Filled 2015-08-28: qty 2

## 2015-08-28 MED ORDER — DIPHENHYDRAMINE HCL 25 MG PO CAPS
50.0000 mg | ORAL_CAPSULE | Freq: Once | ORAL | Status: AC
  Administered 2015-08-28: 50 mg via ORAL

## 2015-08-28 MED ORDER — SODIUM CHLORIDE 0.9 % IV SOLN
10.0000 mg | Freq: Once | INTRAVENOUS | Status: AC
  Administered 2015-08-28: 10 mg via INTRAVENOUS
  Filled 2015-08-28: qty 1

## 2015-08-28 MED ORDER — ACETAMINOPHEN 325 MG PO TABS
650.0000 mg | ORAL_TABLET | Freq: Once | ORAL | Status: AC
  Administered 2015-08-28: 650 mg via ORAL

## 2015-08-28 MED ORDER — VINCRISTINE SULFATE CHEMO INJECTION 1 MG/ML
1.0000 mg | Freq: Once | INTRAVENOUS | Status: AC
  Administered 2015-08-28: 1 mg via INTRAVENOUS
  Filled 2015-08-28: qty 1

## 2015-08-28 MED ORDER — SODIUM CHLORIDE 0.9 % IV SOLN
Freq: Once | INTRAVENOUS | Status: AC
  Administered 2015-08-28: 12:00:00 via INTRAVENOUS

## 2015-08-28 MED ORDER — DIPHENHYDRAMINE HCL 25 MG PO CAPS
ORAL_CAPSULE | ORAL | Status: AC
  Filled 2015-08-28: qty 2

## 2015-08-28 MED ORDER — SODIUM CHLORIDE 0.9 % IV SOLN
375.0000 mg/m2 | Freq: Once | INTRAVENOUS | Status: AC
  Administered 2015-08-28: 800 mg via INTRAVENOUS
  Filled 2015-08-28: qty 50

## 2015-08-28 NOTE — Progress Notes (Signed)
1355: HR 48-49, Dr. Alvy Bimler aware, okay to proceed with Rituxan per Dr. Alvy Bimler, no new orders at this time.

## 2015-08-28 NOTE — Assessment & Plan Note (Signed)
This is likely anemia of chronic disease. The patient denies recent history of bleeding such as epistaxis, hematuria or hematochezia. He is asymptomatic from the anemia. We will observe for now.  He does not require transfusion now.  I would defer ESA management to nephrologist. 

## 2015-08-28 NOTE — Patient Instructions (Signed)
[facility] Discharge Instructions for Patients Receiving Chemotherapy  Today you received the following chemotherapy agents Adriamycin, Vincristine, Cytoxan, Rituxan To help prevent nausea and vomiting after your treatment, we encourage you to take your nausea medication as directed.   If you develop nausea and vomiting that is not controlled by your nausea medication, call the clinic.   BELOW ARE SYMPTOMS THAT SHOULD BE REPORTED IMMEDIATELY:  *FEVER GREATER THAN 100.5 F  *CHILLS WITH OR WITHOUT FEVER  NAUSEA AND VOMITING THAT IS NOT CONTROLLED WITH YOUR NAUSEA MEDICATION  *UNUSUAL SHORTNESS OF BREATH  *UNUSUAL BRUISING OR BLEEDING  TENDERNESS IN MOUTH AND THROAT WITH OR WITHOUT PRESENCE OF ULCERS  *URINARY PROBLEMS  *BOWEL PROBLEMS  UNUSUAL RASH Items with * indicate a potential emergency and should be followed up as soon as possible.  Feel free to call the clinic you have any questions or concerns. The clinic phone number is [phone].  Please show the CHEMO ALERT CARD at check-in to the Emergency Department and triage nurse.  Doxorubicin injection What is this medicine? DOXORUBICIN (dox oh ROO bi sin) is a chemotherapy drug. It is used to treat many kinds of cancer like Hodgkin's disease, leukemia, non-Hodgkin's lymphoma, neuroblastoma, sarcoma, and Wilms' tumor. It is also used to treat bladder cancer, breast cancer, lung cancer, ovarian cancer, stomach cancer, and thyroid cancer. This medicine may be used for other purposes; ask your health care provider or pharmacist if you have questions. What should I tell my health care provider before I take this medicine? They need to know if you have any of these conditions: -blood disorders -heart disease, recent heart attack -infection (especially a virus infection such as chickenpox, cold sores, or herpes) -irregular heartbeat -liver disease -recent or ongoing radiation therapy -an unusual or allergic  reaction to doxorubicin, other chemotherapy agents, other medicines, foods, dyes, or preservatives -pregnant or trying to get pregnant -breast-feeding How should I use this medicine? This drug is given as an infusion into a vein. It is administered in a hospital or clinic by a specially trained health care professional. If you have pain, swelling, burning or any unusual feeling around the site of your injection, tell your health care professional right away. Talk to your pediatrician regarding the use of this medicine in children. Special care may be needed. Overdosage: If you think you have taken too much of this medicine contact a poison control center or emergency room at once. NOTE: This medicine is only for you. Do not share this medicine with others. What if I miss a dose? It is important not to miss your dose. Call your doctor or health care professional if you are unable to keep an appointment. What may interact with this medicine? Do not take this medicine with any of the following medications: -cisapride -droperidol -halofantrine -pimozide -zidovudine This medicine may also interact with the following medications: -chloroquine -chlorpromazine -clarithromycin -cyclophosphamide -cyclosporine -erythromycin -medicines for depression, anxiety, or psychotic disturbances -medicines for irregular heart beat like amiodarone, bepridil, dofetilide, encainide, flecainide, propafenone, quinidine -medicines for seizures like ethotoin, fosphenytoin, phenytoin -medicines for nausea, vomiting like dolasetron, ondansetron, palonosetron -medicines to increase blood counts like filgrastim, pegfilgrastim, sargramostim -methadone -methotrexate -pentamidine -progesterone -vaccines -verapamil Talk to your doctor or health care professional before taking any of these medicines: -acetaminophen -aspirin -ibuprofen -ketoprofen -naproxen This list may not describe all possible interactions.  Give your health care provider a list of all the medicines, herbs, non-prescription drugs, or dietary supplements you use. Also tell   them if you smoke, drink alcohol, or use illegal drugs. Some items may interact with your medicine. What should I watch for while using this medicine? Your condition will be monitored carefully while you are receiving this medicine. You will need important blood work done while you are taking this medicine. This drug may make you feel generally unwell. This is not uncommon, as chemotherapy can affect healthy cells as well as cancer cells. Report any side effects. Continue your course of treatment even though you feel ill unless your doctor tells you to stop. Your urine may turn red for a few days after your dose. This is not blood. If your urine is dark or brown, call your doctor. In some cases, you may be given additional medicines to help with side effects. Follow all directions for their use. Call your doctor or health care professional for advice if you get a fever, chills or sore throat, or other symptoms of a cold or flu. Do not treat yourself. This drug decreases your body's ability to fight infections. Try to avoid being around people who are sick. This medicine may increase your risk to bruise or bleed. Call your doctor or health care professional if you notice any unusual bleeding. Be careful brushing and flossing your teeth or using a toothpick because you may get an infection or bleed more easily. If you have any dental work done, tell your dentist you are receiving this medicine. Avoid taking products that contain aspirin, acetaminophen, ibuprofen, naproxen, or ketoprofen unless instructed by your doctor. These medicines may hide a fever. Men and women of childbearing age should use effective birth control methods while using taking this medicine. Do not become pregnant while taking this medicine. There is a potential for serious side effects to an unborn child.  Talk to your health care professional or pharmacist for more information. Do not breast-feed an infant while taking this medicine. Do not let others touch your urine or other body fluids for 5 days after each treatment with this medicine. Caregivers should wear latex gloves to avoid touching body fluids during this time. There is a maximum amount of this medicine you should receive throughout your life. The amount depends on the medical condition being treated and your overall health. Your doctor will watch how much of this medicine you receive in your lifetime. Tell your doctor if you have taken this medicine before. What side effects may I notice from receiving this medicine? Side effects that you should report to your doctor or health care professional as soon as possible: -allergic reactions like skin rash, itching or hives, swelling of the face, lips, or tongue -low blood counts - this medicine may decrease the number of white blood cells, red blood cells and platelets. You may be at increased risk for infections and bleeding. -signs of infection - fever or chills, cough, sore throat, pain or difficulty passing urine -signs of decreased platelets or bleeding - bruising, pinpoint red spots on the skin, black, tarry stools, blood in the urine -signs of decreased red blood cells - unusually weak or tired, fainting spells, lightheadedness -breathing problems -chest pain -fast, irregular heartbeat -mouth sores -nausea, vomiting -pain, swelling, redness at site where injected -pain, tingling, numbness in the hands or feet -swelling of ankles, feet, or hands -unusual bleeding or bruising Side effects that usually do not require medical attention (report to your doctor or health care professional if they continue or are bothersome): -diarrhea -facial flushing -hair loss -loss of appetite -  missed menstrual periods -nail discoloration or damage -red or watery eyes -red colored urine -stomach  upset This list may not describe all possible side effects. Call your doctor for medical advice about side effects. You may report side effects to FDA at 1-800-FDA-1088. Where should I keep my medicine? This drug is given in a hospital or clinic and will not be stored at home. NOTE: This sheet is a summary. It may not cover all possible information. If you have questions about this medicine, talk to your doctor, pharmacist, or health care provider.    2016, Elsevier/[employer] Standard. (2012-05-12 09:54:34)  Vincristine injection What is this medicine? VINCRISTINE (vin KRIS teen) is a chemotherapy drug. It slows the growth of cancer cells. This medicine is used to treat many types of cancer like Hodgkin's disease, leukemia, non-Hodgkin's lymphoma, neuroblastoma (brain cancer), rhabdomyosarcoma, and Wilms' tumor. This medicine may be used for other purposes; ask your health care provider or pharmacist if you have questions. What should I tell my health care provider before I take this medicine? They need to know if you have any of these conditions: -blood disorders -gout -infection (especially chickenpox, cold sores, or herpes) -kidney disease -liver disease -lung disease -nervous system disease like Charcot-Marie-Tooth (CMT) -recent or ongoing radiation therapy -an unusual or allergic reaction to vincristine, other chemotherapy agents, other medicines, foods, dyes, or preservatives -pregnant or trying to get pregnant -breast-feeding How should I use this medicine? This drug is given as an infusion into a vein. It is administered in a hospital or clinic by a specially trained health care professional. If you have pain, swelling, burning, or any unusual feeling around the site of your injection, tell your health care professional right away. Talk to your pediatrician regarding the use of this medicine in children. While this drug may be prescribed for selected conditions, precautions do  apply. Overdosage: If you think you have taken too much of this medicine contact a poison control center or emergency room at once. NOTE: This medicine is only for you. Do not share this medicine with others. What if I miss a dose? It is important not to miss your dose. Call your doctor or health care professional if you are unable to keep an appointment. What may interact with this medicine? Do not take this medicine with any of the following medications: -itraconazole -mibefradil -voriconazole This medicine may also interact with the following medications: -cyclosporine -erythromycin -fluconazole -ketoconazole -medicines for HIV like delavirdine, efavirenz, nevirapine -medicines for seizures like ethotoin, fosphenotoin, phenytoin -medicines to increase blood counts like filgrastim, pegfilgrastim, sargramostim -other chemotherapy drugs like cisplatin, L-asparaginase, methotrexate, mitomycin, paclitaxel -pegaspargase -vaccines -zalcitabine, ddC Talk to your doctor or health care professional before taking any of these medicines: -acetaminophen -aspirin -ibuprofen -ketoprofen -naproxen This list may not describe all possible interactions. Give your health care provider a list of all the medicines, herbs, non-prescription drugs, or dietary supplements you use. Also tell them if you smoke, drink alcohol, or use illegal drugs. Some items may interact with your medicine. What should I watch for while using this medicine? Your condition will be monitored carefully while you are receiving this medicine. You will need important blood work done while you are taking this medicine. This drug may make you feel generally unwell. This is not uncommon, as chemotherapy can affect healthy cells as well as cancer cells. Report any side effects. Continue your course of treatment even though you feel ill unless your doctor tells you to stop. In some cases,   you may be given additional medicines to help  with side effects. Follow all directions for their use. Call your doctor or health care professional for advice if you get a fever, chills or sore throat, or other symptoms of a cold or flu. Do not treat yourself. Avoid taking products that contain aspirin, acetaminophen, ibuprofen, naproxen, or ketoprofen unless instructed by your doctor. These medicines may hide a fever. Do not become pregnant while taking this medicine. Women should inform their doctor if they wish to become pregnant or think they might be pregnant. There is a potential for serious side effects to an unborn child. Talk to your health care professional or pharmacist for more information. Do not breast-feed an infant while taking this medicine. Men may have a lower sperm count while taking this medicine. Talk to your doctor if you plan to father a child. What side effects may I notice from receiving this medicine? Side effects that you should report to your doctor or health care professional as soon as possible: -allergic reactions like skin rash, itching or hives, swelling of the face, lips, or tongue -breathing problems -confusion or changes in emotions or moods -constipation -cough -mouth sores -muscle weakness -nausea and vomiting -pain, swelling, redness or irritation at the injection site -pain, tingling, numbness in the hands or feet -problems with balance, talking, walking -seizures -stomach pain -trouble passing urine or change in the amount of urine Side effects that usually do not require medical attention (report to your doctor or health care professional if they continue or are bothersome): -diarrhea -hair loss -jaw pain -loss of appetite This list may not describe all possible side effects. Call your doctor for medical advice about side effects. You may report side effects to FDA at 1-800-FDA-1088. Where should I keep my medicine? This drug is given in a hospital or clinic and will not be stored at  home. NOTE: This sheet is a summary. It may not cover all possible information. If you have questions about this medicine, talk to your doctor, pharmacist, or health care provider.    2016, [employer]/Gold Standard. (2007-10-12 17:17:13) Cyclophosphamide injection What is this medicine? CYCLOPHOSPHAMIDE (sye kloe FOSS fa mide) is a chemotherapy drug. It slows the growth of cancer cells. This medicine is used to treat many types of cancer like lymphoma, myeloma, leukemia, breast cancer, and ovarian cancer, to name a few. This medicine may be used for other purposes; ask your health care provider or pharmacist if you have questions. What should I tell my health care provider before I take this medicine? They need to know if you have any of these conditions: -blood disorders -history of other chemotherapy -infection -kidney disease -liver disease -recent or ongoing radiation therapy -tumors in the bone marrow -an unusual or allergic reaction to cyclophosphamide, other chemotherapy, other medicines, foods, dyes, or preservatives -pregnant or trying to get pregnant -breast-feeding How should I use this medicine? This drug is usually given as an injection into a vein or muscle or by infusion into a vein. It is administered in a hospital or clinic by a specially trained health care professional. Talk to your pediatrician regarding the use of this medicine in children. Special care may be needed. Overdosage: If you think you have taken too much of this medicine contact a poison control center or emergency room at once. NOTE: This medicine is only for you. Do not share this medicine with others. What if I miss a dose? It is important not to miss your   dose. Call your doctor or health care professional if you are unable to keep an appointment. What may interact with this medicine? This medicine may interact with the following medications: -amiodarone -amphotericin B -azathioprine -certain  antiviral medicines for HIV or AIDS such as protease inhibitors (e.g., indinavir, ritonavir) and zidovudine -certain blood pressure medications such as benazepril, captopril, enalapril, fosinopril, lisinopril, moexipril, monopril, perindopril, quinapril, ramipril, trandolapril -certain cancer medications such as anthracyclines (e.g., daunorubicin, doxorubicin), busulfan, cytarabine, paclitaxel, pentostatin, tamoxifen, trastuzumab -certain diuretics such as chlorothiazide, chlorthalidone, hydrochlorothiazide, indapamide, metolazone -certain medicines that treat or prevent blood clots like warfarin -certain muscle relaxants such as succinylcholine -cyclosporine -etanercept -indomethacin -medicines to increase blood counts like filgrastim, pegfilgrastim, sargramostim -medicines used as general anesthesia -metronidazole -natalizumab This list may not describe all possible interactions. Give your health care provider a list of all the medicines, herbs, non-prescription drugs, or dietary supplements you use. Also tell them if you smoke, drink alcohol, or use illegal drugs. Some items may interact with your medicine. What should I watch for while using this medicine? Visit your doctor for checks on your progress. This drug may make you feel generally unwell. This is not uncommon, as chemotherapy can affect healthy cells as well as cancer cells. Report any side effects. Continue your course of treatment even though you feel ill unless your doctor tells you to stop. Drink water or other fluids as directed. Urinate often, even at night. In some cases, you may be given additional medicines to help with side effects. Follow all directions for their use. Call your doctor or health care professional for advice if you get a fever, chills or sore throat, or other symptoms of a cold or flu. Do not treat yourself. This drug decreases your body's ability to fight infections. Try to avoid being around people who are  sick. This medicine may increase your risk to bruise or bleed. Call your doctor or health care professional if you notice any unusual bleeding. Be careful brushing and flossing your teeth or using a toothpick because you may get an infection or bleed more easily. If you have any dental work done, tell your dentist you are receiving this medicine. You may get drowsy or dizzy. Do not drive, use machinery, or do anything that needs mental alertness until you know how this medicine affects you. Do not become pregnant while taking this medicine or for 1 year after stopping it. Women should inform their doctor if they wish to become pregnant or think they might be pregnant. Men should not father a child while taking this medicine and for 4 months after stopping it. There is a potential for serious side effects to an unborn child. Talk to your health care professional or pharmacist for more information. Do not breast-feed an infant while taking this medicine. This medicine may interfere with the ability to have a child. This medicine has caused ovarian failure in some women. This medicine has caused reduced sperm counts in some men. You should talk with your doctor or health care professional if you are concerned about your fertility. If you are going to have surgery, tell your doctor or health care professional that you have taken this medicine. What side effects may I notice from receiving this medicine? Side effects that you should report to your doctor or health care professional as soon as possible: -allergic reactions like skin rash, itching or hives, swelling of the face, lips, or tongue -low blood counts - this medicine may decrease the number   of white blood cells, red blood cells and platelets. You may be at increased risk for infections and bleeding. -signs of infection - fever or chills, cough, sore throat, pain or difficulty passing urine -signs of decreased platelets or bleeding - bruising,  pinpoint red spots on the skin, black, tarry stools, blood in the urine -signs of decreased red blood cells - unusually weak or tired, fainting spells, lightheadedness -breathing problems -dark urine -dizziness -palpitations -swelling of the ankles, feet, hands -trouble passing urine or change in the amount of urine -weight gain -yellowing of the eyes or skin Side effects that usually do not require medical attention (report to your doctor or health care professional if they continue or are bothersome): -changes in nail or skin color -hair loss -missed menstrual periods -mouth sores -nausea, vomiting This list may not describe all possible side effects. Call your doctor for medical advice about side effects. You may report side effects to FDA at 1-800-FDA-1088. Where should I keep my medicine? This drug is given in a hospital or clinic and will not be stored at home. NOTE: This sheet is a summary. It may not cover all possible information. If you have questions about this medicine, talk to your doctor, pharmacist, or health care provider.    2016, [employer] Standard. (2011-11-29 16:22:58)   Rituximab injection What is this medicine? RITUXIMAB (ri TUX i mab) is a monoclonal antibody. It is used commonly to treat non-Hodgkin lymphoma and other conditions. It is also used to treat rheumatoid arthritis (RA). In RA, this medicine slows the inflammatory process and help reduce joint pain and swelling. This medicine is often used with other cancer or arthritis medications. This medicine may be used for other purposes; ask your health care provider or pharmacist if you have questions. What should I tell my health care provider before I take this medicine? They need to know if you have any of these conditions: -blood disorders -heart disease -history of hepatitis B -infection (especially a virus infection such as chickenpox, cold sores, or herpes) -irregular heartbeat -kidney  disease -lung or breathing disease, like asthma -lupus -an unusual or allergic reaction to rituximab, mouse proteins, other medicines, foods, dyes, or preservatives -pregnant or trying to get pregnant -breast-feeding How should I use this medicine? This medicine is for infusion into a vein. It is administered in a hospital or clinic by a specially trained health care professional. A special MedGuide will be given to you by the pharmacist with each prescription and refill. Be sure to read this information carefully each time. Talk to your pediatrician regarding the use of this medicine in children. This medicine is not approved for use in children. Overdosage: If you think you have taken too much of this medicine contact a poison control center or emergency room at once. NOTE: This medicine is only for you. Do not share this medicine with others. What if I miss a dose? It is important not to miss a dose. Call your doctor or health care professional if you are unable to keep an appointment. What may interact with this medicine? -cisplatin -medicines for blood pressure -some other medicines for arthritis -vaccines This list may not describe all possible interactions. Give your health care provider a list of all the medicines, herbs, non-prescription drugs, or dietary supplements you use. Also tell them if you smoke, drink alcohol, or use illegal drugs. Some items may interact with your medicine. What should I watch for while using this medicine? Report any side effects   that you notice during your treatment right away, such as changes in your breathing, fever, chills, dizziness or lightheadedness. These effects are more common with the first dose. Visit your prescriber or health care professional for checks on your progress. You will need to have regular blood work. Report any other side effects. The side effects of this medicine can continue after you finish your treatment. Continue your course of  treatment even though you feel ill unless your doctor tells you to stop. Call your doctor or health care professional for advice if you get a fever, chills or sore throat, or other symptoms of a cold or flu. Do not treat yourself. This drug decreases your body's ability to fight infections. Try to avoid being around people who are sick. This medicine may increase your risk to bruise or bleed. Call your doctor or health care professional if you notice any unusual bleeding. Be careful brushing and flossing your teeth or using a toothpick because you may get an infection or bleed more easily. If you have any dental work done, tell your dentist you are receiving this medicine. Avoid taking products that contain aspirin, acetaminophen, ibuprofen, naproxen, or ketoprofen unless instructed by your doctor. These medicines may hide a fever. Do not become pregnant while taking this medicine. Women should inform their doctor if they wish to become pregnant or think they might be pregnant. There is a potential for serious side effects to an unborn child. Talk to your health care professional or pharmacist for more information. Do not breast-feed an infant while taking this medicine. What side effects may I notice from receiving this medicine? Side effects that you should report to your doctor or health care professional as soon as possible: -allergic reactions like skin rash, itching or hives, swelling of the face, lips, or tongue -low blood counts - this medicine may decrease the number of white blood cells, red blood cells and platelets. You may be at increased risk for infections and bleeding. -signs of infection - fever or chills, cough, sore throat, pain or difficulty passing urine -signs of decreased platelets or bleeding - bruising, pinpoint red spots on the skin, black, tarry stools, blood in the urine -signs of decreased red blood cells - unusually weak or tired, fainting spells, lightheadedness -breathing  problems -confused, not responsive -chest pain -fast, irregular heartbeat -feeling faint or lightheaded, falls -mouth sores -redness, blistering, peeling or loosening of the skin, including inside the mouth -stomach pain -swelling of the ankles, feet, or hands -trouble passing urine or change in the amount of urine Side effects that usually do not require medical attention (report to your doctor or other health care professional if they continue or are bothersome): -anxiety -headache -loss of appetite -muscle aches -nausea -night sweats This list may not describe all possible side effects. Call your doctor for medical advice about side effects. You may report side effects to FDA at 1-800-FDA-1088. Where should I keep my medicine? This drug is given in a hospital or clinic and will not be stored at home. NOTE: This sheet is a summary. It may not cover all possible information. If you have questions about this medicine, talk to your doctor, pharmacist, or health care provider.    2016, [employer] Standard. (2014-03-23 22:30:56)  

## 2015-08-28 NOTE — Assessment & Plan Note (Signed)
He tolerated treatment well without major side effects. PET/CT scan from 08/25/2015 show near complete response to treatment I will proceed with cycle 4 of treatment with similar dose adjustment. Plan for total 6 cycles of treatment

## 2015-08-28 NOTE — Telephone Encounter (Signed)
per po to sch pt appt-gave p copy of avs/cal

## 2015-08-28 NOTE — Progress Notes (Signed)
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[place])     06/12/2015 Imaging    CT chest showed lymphadenopathy within the low neck and low chest, consistent with active lymphoma     06/14/2015 Procedure    He underwent CT-guided biopsy     06/14/2015 Pathology Results    Biopsy of the retroperitoneal mass confirmed diffuse large B-cell lymphoma     06/21/2015 Imaging    ECHO showed normal EF     06/21/2015 Procedure    He has port placement     06/23/2015 -  Chemotherapy    He received mini-RCHOP chemo     08/25/2015 PET scan    Significant interval decrease in size of the bulky retroperitoneal lymphadenopathy suggesting an excellent response to therapy. Residual matted soft tissue density in the retroperitoneum with SUV max of approximately 3.3      INTERVAL HISTORY: Please see below for problem oriented charting. He is seen prior to cycle 4 of treatment. He tolerated previous treatment well without any side effects. No rcent mucositis, nausea, change in bowel habits or infection Denies peripheral neuropathy  REVIEW OF SYSTEMS:   Constitutional: Denies fevers, chills or abnormal weight loss Eyes: Denies blurriness of vision Ears, nose, mouth, throat, and face: Denies mucositis or sore throat Respiratory: Denies cough, dyspnea or wheezes Cardiovascular: Denies palpitation, chest discomfort or lower extremity swelling Gastrointestinal:  Denies nausea, heartburn or change in bowel habits Skin: Denies abnormal skin rashes Lymphatics: Denies new lymphadenopathy or easy bruising Neurological:Denies  numbness, tingling or new weaknesses Behavioral/Psych: Mood is stable, no new changes  All other systems were reviewed with the patient and are negative.  I have reviewed the past medical history, past surgical history, social history and family history with the patient and they are unchanged from previous note.  ALLERGIES:  has No Known Allergies.  MEDICATIONS:  Current Outpatient Prescriptions  Medication Sig Dispense Refill  . aspirin 325 MG tablet Take 1 tablet (325 mg total) by mouth daily. 30 tablet 0  . calcium acetate (PHOSLO) 667 MG capsule Take 1 capsule by mouth 2 (two) times daily.   11  . cloNIDine (CATAPRES) 0.2 MG tablet Take 0.4 mg by mouth 2 (two) times daily.     Marland Kitchen docusate sodium (COLACE) 100 MG capsule Take 1 capsule (100 mg total) by mouth every 12 (twelve) hours. (Patient taking differently: Take 100 mg by mouth every 12 (twelve) hours. ) 60 capsule 0  . isosorbide-hydrALAZINE (BIDIL) 20-37.5 MG tablet Take 0.5 tablets by mouth 2 (two) times daily.    Marland Kitchen levETIRAcetam (KEPPRA) 500 MG tablet Take 1 tablet (500 mg total) by mouth 2 (two) times daily. 60 tablet 6  . levothyroxine (SYNTHROID, LEVOTHROID) 25 MCG tablet Take 25 mcg by mouth daily.   0  . lidocaine-prilocaine (EMLA) cream Apply to affected area once 30 g 3  . metoprolol (LOPRESSOR) 100 MG tablet Take 150 mg by mouth 2 (two) times daily.     . multivitamin (RENA-VIT) TABS tablet Take 1 tablet by mouth at bedtime. 90 tablet 0  . OVER THE COUNTER MEDICATION Take 1 capsule by mouth 2 (  two) times daily. "Kidney Factor"    . pentafluoroprop-tetrafluoroeth (GEBAUERS) AERO Apply 1 application topically as needed (topical anesthesia for hemodialysis). 30 mL 0  . predniSONE (DELTASONE) 20 MG tablet Take 3 tablets (60 mg total) by mouth daily. Take on days 2-5 of chemotherapy. 12 tablet 6  . ondansetron (ZOFRAN ODT) 8 MG disintegrating tablet Take 1 tablet (8 mg total) by mouth every 8 (eight) hours as needed for nausea or  vomiting. (Patient not taking: Reported on 08/04/2015) 12 tablet 0  . oxyCODONE-acetaminophen (PERCOCET/ROXICET) 5-325 MG tablet Take 1 tablet by mouth every 4 (four) hours as needed for moderate pain. (Patient not taking: Reported on 08/04/2015) 90 tablet 0  . prochlorperazine (COMPAZINE) 10 MG tablet Take 1 tablet (10 mg total) by mouth every 6 (six) hours as needed (Nausea or vomiting). (Patient not taking: Reported on 08/04/2015) 30 tablet 6   No current facility-administered medications for this visit.    Facility-Administered Medications Ordered in Other Visits  Medication Dose Route Frequency Provider Last Rate Last Dose  . fludeoxyglucose F - 18 (FDG) injection [id number] millicurie  [id number] millicurie Intravenous Once PRN [employer] MD      . heparin lock flush 100 unit/mL  500 Units Intracatheter Once PRN Heath Lark, MD      . riTUXimab (RITUXAN) 800 mg in sodium chloride 0.9 % 250 mL (2.4242 mg/mL) chemo infusion  375 mg/m2 (Treatment Plan Recorded) Intravenous Once Heath Lark, MD      . sodium chloride flush (NS) 0.9 % injection 10 mL  10 mL Intracatheter PRN Heath Lark, MD        PHYSICAL EXAMINATION: ECOG PERFORMANCE STATUS: 1 - Symptomatic but completely ambulatory  Vitals:   08/28/15 1115  BP: (!) 166/90  Pulse: (!) 59  Resp: 17  Temp: 97.5 F (36.4 C)   Filed Weights   08/28/15 1115  Weight: 190 lb 4.8 oz (86.3 kg)    GENERAL:alert, no distress and comfortable SKIN: skin color, texture, turgor are normal, no rashes or significant lesions EYES: normal, Conjunctiva are pink and non-injected, sclera clear OROPHARYNX:no exudate, no erythema and lips, buccal mucosa, and tongue normal  NECK: supple, thyroid normal size, non-tender, without nodularity LYMPH:  no palpable lymphadenopathy in the cervical, axillary or inguinal LUNGS: clear to auscultation and percussion with normal breathing effort HEART: regular rate & rhythm and no murmurs and no lower extremity  edema ABDOMEN:abdomen soft, non-tender and normal bowel sounds Musculoskeletal:no cyanosis of digits and no clubbing  NEURO: alert & oriented x 3 with fluent speech, Persistent left-sided deficit  LABORATORY DATA:  I have reviewed the data as listed    Component Value Date/Time   NA 140 08/25/2015 0828   K 4.0 08/25/2015 0828   CL 95 (L) 06/21/2015 1345   CO2 27 08/25/2015 0828   GLUCOSE 171 (H) 08/25/2015 0828   BUN 28.7 (H) 08/25/2015 0828   CREATININE 5.4 (HH) 08/25/2015 0828   CALCIUM 9.0 08/25/2015 0828   PROT 6.9 08/25/2015 0828   ALBUMIN 3.3 (L) 08/25/2015 0828   AST 25 08/25/2015 0828   ALT 16 08/25/2015 0828   ALKPHOS 96 08/25/2015 0828   BILITOT 0.35 08/25/2015 0828   GFRNONAA 8 (L) 06/21/2015 1345   GFRAA 9 (L) 06/21/2015 1345    No results found for: SPEP, UPEP  Lab Results  Component Value Date   WBC 7.3 08/25/2015   NEUTROABS 5.8 08/25/2015   HGB 11.6 (L) 08/25/2015   HCT 37.7 (L) 08/25/2015  MCV 101.1 (H) 08/25/2015   PLT 208 08/25/2015      Chemistry      Component Value Date/Time   NA 140 08/25/2015 0828   K 4.0 08/25/2015 0828   CL 95 (L) 06/21/2015 1345   CO2 27 08/25/2015 0828   BUN 28.7 (H) 08/25/2015 0828   CREATININE 5.4 (HH) 08/25/2015 0828      Component Value Date/Time   CALCIUM 9.0 08/25/2015 0828   ALKPHOS 96 08/25/2015 0828   AST 25 08/25/2015 0828   ALT 16 08/25/2015 0828   BILITOT 0.35 08/25/2015 0828       RADIOGRAPHIC STUDIES:I reviewed recent PET CT scan with him and his wife I have personally reviewed the radiological images as listed and agreed with the findings in the report.    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 PET/CT scan from 08/25/2015 show near complete response to treatment I will proceed with cycle 4 of treatment with similar dose adjustment. Plan for total 6 cycles of treatment  End stage renal  failure on dialysis [facility]) The patient is currently receiving hemodialysis. The cause of his renal failure is due to polycystic kidney disease. We will order dose adjustment as above  Anemia in chronic kidney disease This is likely anemia of chronic disease. The patient denies recent history of bleeding such as epistaxis, hematuria or hematochezia. He is asymptomatic from the anemia. We will observe for now.  He does not require transfusion now.  I would defer ESA management to nephrologist.  Essential hypertension he will continue current medical management. I recommend close follow-up with primary care doctor & nephrologist for medication adjustment.   Hemiparesis affecting right side as late effect of stroke [facility]) The patient had history of fall 3 months ago with subdural hematoma. Since then, he is able to manage at home without further falls. He continues to use cane whenever he walks.    No orders of the defined types were placed in this encounter.  All questions were answered. The patient knows to call the clinic with any problems, questions or concerns. No barriers to learning was detected. I spent 25 minutes counseling the patient face to face. The total time spent in the appointment was 30 minutes and more than 50% was on counseling and review of test results     [facility], Goldie Tregoning, MD 08/28/2015 1:41 PM

## 2015-08-28 NOTE — Assessment & Plan Note (Signed)
The patient had history of fall 3 months ago with subdural hematoma. Since then, he is able to manage at home without further falls. He continues to use cane whenever he walks.

## 2015-08-28 NOTE — Assessment & Plan Note (Signed)
he will continue current medical management. I recommend close follow-up with primary care doctor & nephrologist for medication adjustment.

## 2015-08-28 NOTE — Assessment & Plan Note (Signed)
The patient is currently receiving hemodialysis. The cause of his renal failure is due to polycystic kidney disease. We will order dose adjustment as above 

## 2015-08-29 ENCOUNTER — Ambulatory Visit: Payer: PRIVATE HEALTH INSURANCE

## 2015-08-29 DIAGNOSIS — N2581 Secondary hyperparathyroidism of renal origin: Secondary | ICD-10-CM | POA: Diagnosis not present

## 2015-08-29 DIAGNOSIS — D631 Anemia in chronic kidney disease: Secondary | ICD-10-CM | POA: Diagnosis not present

## 2015-08-29 DIAGNOSIS — E119 Type 2 diabetes mellitus without complications: Secondary | ICD-10-CM | POA: Diagnosis not present

## 2015-08-29 DIAGNOSIS — N186 End stage renal disease: Secondary | ICD-10-CM | POA: Diagnosis not present

## 2015-08-30 ENCOUNTER — Ambulatory Visit (HOSPITAL_BASED_OUTPATIENT_CLINIC_OR_DEPARTMENT_OTHER): Payer: Medicare Other

## 2015-08-30 VITALS — BP 155/95 | HR 60 | Temp 97.9°F | Resp 20

## 2015-08-30 DIAGNOSIS — C8338 Diffuse large B-cell lymphoma, lymph nodes of multiple sites: Secondary | ICD-10-CM | POA: Diagnosis present

## 2015-08-30 DIAGNOSIS — Z5189 Encounter for other specified aftercare: Secondary | ICD-10-CM

## 2015-08-30 MED ORDER — PEGFILGRASTIM INJECTION 6 MG/0.6ML ~~LOC~~
6.0000 mg | PREFILLED_SYRINGE | Freq: Once | SUBCUTANEOUS | Status: AC
  Administered 2015-08-30: 6 mg via SUBCUTANEOUS
  Filled 2015-08-30: qty 0.6

## 2015-08-30 NOTE — Patient Instructions (Signed)
Pegfilgrastim injection (Neulasta) What is this medicine?  PEGFILGRASTIM (PEG fil gra stim) is a long-acting granulocyte colony-stimulating factor that stimulates the growth of neutrophils, a type of white blood cell important in the body's fight against infection. It is used to reduce the incidence of fever and infection in patients with certain types of cancer who are receiving chemotherapy that affects the bone marrow, and to increase survival after being exposed to high doses of radiation. This medicine may be used for other purposes; ask your health care provider or pharmacist if you have questions. What should I tell my health care provider before I take this medicine? They need to know if you have any of these conditions: -kidney disease -latex allergy -ongoing radiation therapy -sickle cell disease -skin reactions to acrylic adhesives (On-Body Injector only) -an unusual or allergic reaction to pegfilgrastim, filgrastim, other medicines, foods, dyes, or preservatives -pregnant or trying to get pregnant -breast-feeding How should I use this medicine? This medicine is for injection under the skin. If you get this medicine at home, you will be taught how to prepare and give the pre-filled syringe or how to use the On-body Injector. Refer to the patient Instructions for Use for detailed instructions. Use exactly as directed. Take your medicine at regular intervals. Do not take your medicine more often than directed. It is important that you put your used needles and syringes in a special sharps container. Do not put them in a trash can. If you do not have a sharps container, call your pharmacist or healthcare provider to get one. Talk to your pediatrician regarding the use of this medicine in children. While this drug may be prescribed for selected conditions, precautions do apply. Overdosage: If you think you have taken too much of this medicine contact a poison control center or emergency  room at once. NOTE: This medicine is only for you. Do not share this medicine with others. What if I miss a dose? It is important not to miss your dose. Call your doctor or health care professional if you miss your dose. If you miss a dose due to an On-body Injector failure or leakage, a new dose should be administered as soon as possible using a single prefilled syringe for manual use. What may interact with this medicine? Interactions have not been studied. Give your health care provider a list of all the medicines, herbs, non-prescription drugs, or dietary supplements you use. Also tell them if you smoke, drink alcohol, or use illegal drugs. Some items may interact with your medicine. This list may not describe all possible interactions. Give your health care provider a list of all the medicines, herbs, non-prescription drugs, or dietary supplements you use. Also tell them if you smoke, drink alcohol, or use illegal drugs. Some items may interact with your medicine. What should I watch for while using this medicine? You may need blood work done while you are taking this medicine. If you are going to need a MRI, CT scan, or other procedure, tell your doctor that you are using this medicine (On-Body Injector only). What side effects may I notice from receiving this medicine? Side effects that you should report to your doctor or health care professional as soon as possible: -allergic reactions like skin rash, itching or hives, swelling of the face, lips, or tongue -dizziness -fever -pain, redness, or irritation at site where injected -pinpoint red spots on the skin -red or dark-brown urine -shortness of breath or breathing problems -stomach or side pain,  or pain at the shoulder -swelling -tiredness -trouble passing urine or change in the amount of urine Side effects that usually do not require medical attention (report to your doctor or health care professional if they continue or are  bothersome): -bone pain -muscle pain This list may not describe all possible side effects. Call your doctor for medical advice about side effects. You may report side effects to FDA at 1-800-FDA-1088. Where should I keep my medicine? Keep out of the reach of children. Store pre-filled syringes in a refrigerator between 2 and 8 degrees C (36 and 46 degrees F). Do not freeze. Keep in carton to protect from light. Throw away this medicine if it is left out of the refrigerator for more than 48 hours. Throw away any unused medicine after the expiration date. NOTE: This sheet is a summary. It may not cover all possible information. If you have questions about this medicine, talk to your doctor, pharmacist, or health care provider.    2016, Elsevier/Gold Standard. (2014-02-03 14:30:14)

## 2015-08-31 DIAGNOSIS — E119 Type 2 diabetes mellitus without complications: Secondary | ICD-10-CM | POA: Diagnosis not present

## 2015-08-31 DIAGNOSIS — D631 Anemia in chronic kidney disease: Secondary | ICD-10-CM | POA: Diagnosis not present

## 2015-08-31 DIAGNOSIS — N2581 Secondary hyperparathyroidism of renal origin: Secondary | ICD-10-CM | POA: Diagnosis not present

## 2015-08-31 DIAGNOSIS — N186 End stage renal disease: Secondary | ICD-10-CM | POA: Diagnosis not present

## 2015-09-02 DIAGNOSIS — E119 Type 2 diabetes mellitus without complications: Secondary | ICD-10-CM | POA: Diagnosis not present

## 2015-09-02 DIAGNOSIS — N186 End stage renal disease: Secondary | ICD-10-CM | POA: Diagnosis not present

## 2015-09-02 DIAGNOSIS — D631 Anemia in chronic kidney disease: Secondary | ICD-10-CM | POA: Diagnosis not present

## 2015-09-02 DIAGNOSIS — N2581 Secondary hyperparathyroidism of renal origin: Secondary | ICD-10-CM | POA: Diagnosis not present

## 2015-09-05 DIAGNOSIS — E119 Type 2 diabetes mellitus without complications: Secondary | ICD-10-CM | POA: Diagnosis not present

## 2015-09-05 DIAGNOSIS — N186 End stage renal disease: Secondary | ICD-10-CM | POA: Diagnosis not present

## 2015-09-05 DIAGNOSIS — D631 Anemia in chronic kidney disease: Secondary | ICD-10-CM | POA: Diagnosis not present

## 2015-09-05 DIAGNOSIS — N2581 Secondary hyperparathyroidism of renal origin: Secondary | ICD-10-CM | POA: Diagnosis not present

## 2015-09-07 DIAGNOSIS — D631 Anemia in chronic kidney disease: Secondary | ICD-10-CM | POA: Diagnosis not present

## 2015-09-07 DIAGNOSIS — E119 Type 2 diabetes mellitus without complications: Secondary | ICD-10-CM | POA: Diagnosis not present

## 2015-09-07 DIAGNOSIS — N186 End stage renal disease: Secondary | ICD-10-CM | POA: Diagnosis not present

## 2015-09-07 DIAGNOSIS — N2581 Secondary hyperparathyroidism of renal origin: Secondary | ICD-10-CM | POA: Diagnosis not present

## 2015-09-09 DIAGNOSIS — E119 Type 2 diabetes mellitus without complications: Secondary | ICD-10-CM | POA: Diagnosis not present

## 2015-09-09 DIAGNOSIS — D631 Anemia in chronic kidney disease: Secondary | ICD-10-CM | POA: Diagnosis not present

## 2015-09-09 DIAGNOSIS — N186 End stage renal disease: Secondary | ICD-10-CM | POA: Diagnosis not present

## 2015-09-09 DIAGNOSIS — N2581 Secondary hyperparathyroidism of renal origin: Secondary | ICD-10-CM | POA: Diagnosis not present

## 2015-09-12 DIAGNOSIS — D631 Anemia in chronic kidney disease: Secondary | ICD-10-CM | POA: Diagnosis not present

## 2015-09-12 DIAGNOSIS — E119 Type 2 diabetes mellitus without complications: Secondary | ICD-10-CM | POA: Diagnosis not present

## 2015-09-12 DIAGNOSIS — N186 End stage renal disease: Secondary | ICD-10-CM | POA: Diagnosis not present

## 2015-09-12 DIAGNOSIS — N2581 Secondary hyperparathyroidism of renal origin: Secondary | ICD-10-CM | POA: Diagnosis not present

## 2015-09-14 DIAGNOSIS — E119 Type 2 diabetes mellitus without complications: Secondary | ICD-10-CM | POA: Diagnosis not present

## 2015-09-14 DIAGNOSIS — N186 End stage renal disease: Secondary | ICD-10-CM | POA: Diagnosis not present

## 2015-09-14 DIAGNOSIS — N2581 Secondary hyperparathyroidism of renal origin: Secondary | ICD-10-CM | POA: Diagnosis not present

## 2015-09-14 DIAGNOSIS — D631 Anemia in chronic kidney disease: Secondary | ICD-10-CM | POA: Diagnosis not present

## 2015-09-16 DIAGNOSIS — N186 End stage renal disease: Secondary | ICD-10-CM | POA: Diagnosis not present

## 2015-09-16 DIAGNOSIS — N2581 Secondary hyperparathyroidism of renal origin: Secondary | ICD-10-CM | POA: Diagnosis not present

## 2015-09-16 DIAGNOSIS — D631 Anemia in chronic kidney disease: Secondary | ICD-10-CM | POA: Diagnosis not present

## 2015-09-16 DIAGNOSIS — E119 Type 2 diabetes mellitus without complications: Secondary | ICD-10-CM | POA: Diagnosis not present

## 2015-09-18 ENCOUNTER — Encounter: Payer: Self-pay | Admitting: Hematology and Oncology

## 2015-09-18 ENCOUNTER — Ambulatory Visit (HOSPITAL_BASED_OUTPATIENT_CLINIC_OR_DEPARTMENT_OTHER): Payer: Medicare Other

## 2015-09-18 ENCOUNTER — Other Ambulatory Visit (HOSPITAL_BASED_OUTPATIENT_CLINIC_OR_DEPARTMENT_OTHER): Payer: Medicare Other

## 2015-09-18 ENCOUNTER — Telehealth: Payer: Self-pay | Admitting: Hematology and Oncology

## 2015-09-18 ENCOUNTER — Ambulatory Visit (HOSPITAL_BASED_OUTPATIENT_CLINIC_OR_DEPARTMENT_OTHER): Payer: Medicare Other | Admitting: Hematology and Oncology

## 2015-09-18 ENCOUNTER — Ambulatory Visit: Payer: Medicare Other

## 2015-09-18 VITALS — BP 119/80 | HR 54 | Resp 16

## 2015-09-18 DIAGNOSIS — Z992 Dependence on renal dialysis: Secondary | ICD-10-CM | POA: Diagnosis not present

## 2015-09-18 DIAGNOSIS — Z5112 Encounter for antineoplastic immunotherapy: Secondary | ICD-10-CM

## 2015-09-18 DIAGNOSIS — D631 Anemia in chronic kidney disease: Secondary | ICD-10-CM

## 2015-09-18 DIAGNOSIS — C8338 Diffuse large B-cell lymphoma, lymph nodes of multiple sites: Secondary | ICD-10-CM

## 2015-09-18 DIAGNOSIS — N186 End stage renal disease: Secondary | ICD-10-CM | POA: Diagnosis not present

## 2015-09-18 DIAGNOSIS — L309 Dermatitis, unspecified: Secondary | ICD-10-CM

## 2015-09-18 DIAGNOSIS — N189 Chronic kidney disease, unspecified: Secondary | ICD-10-CM

## 2015-09-18 DIAGNOSIS — I69351 Hemiplegia and hemiparesis following cerebral infarction affecting right dominant side: Secondary | ICD-10-CM

## 2015-09-18 DIAGNOSIS — Z95828 Presence of other vascular implants and grafts: Secondary | ICD-10-CM

## 2015-09-18 DIAGNOSIS — Z5111 Encounter for antineoplastic chemotherapy: Secondary | ICD-10-CM

## 2015-09-18 LAB — COMPREHENSIVE METABOLIC PANEL
ALT: 14 U/L (ref 0–55)
ANION GAP: 15 meq/L — AB (ref 3–11)
AST: 28 U/L (ref 5–34)
Albumin: 3.3 g/dL — ABNORMAL LOW (ref 3.5–5.0)
Alkaline Phosphatase: 85 U/L (ref 40–150)
BUN: 55.8 mg/dL — ABNORMAL HIGH (ref 7.0–26.0)
CHLORIDE: 94 meq/L — AB (ref 98–109)
CO2: 25 meq/L (ref 22–29)
CREATININE: 7.8 mg/dL — AB (ref 0.7–1.3)
Calcium: 9.3 mg/dL (ref 8.4–10.4)
EGFR: 8 mL/min/{1.73_m2} — ABNORMAL LOW (ref >90)
Glucose: 121 mg/dl (ref 70–140)
Potassium: 5.3 mEq/L — ABNORMAL HIGH (ref 3.5–5.1)
Sodium: 134 mEq/L — ABNORMAL LOW (ref 136–145)
Total Bilirubin: 0.54 mg/dL (ref 0.20–1.20)
Total Protein: 6.9 g/dL (ref 6.4–8.3)

## 2015-09-18 LAB — CBC WITH DIFFERENTIAL/PLATELET
BASO%: 0.7 % (ref 0.0–2.0)
BASOS ABS: 0 10*3/uL (ref 0.0–0.1)
EOS ABS: 0.5 10*3/uL (ref 0.0–0.5)
EOS%: 8.7 % — AB (ref 0.0–7.0)
HEMATOCRIT: 38.9 % (ref 38.4–49.9)
HEMOGLOBIN: 12.4 g/dL — AB (ref 13.0–17.1)
LYMPH#: 0.7 10*3/uL — AB (ref 0.9–3.3)
LYMPH%: 11.5 % — ABNORMAL LOW (ref 14.0–49.0)
MCH: 30.8 pg (ref 27.2–33.4)
MCHC: 31.8 g/dL — ABNORMAL LOW (ref 32.0–36.0)
MCV: 96.8 fL (ref 79.3–98.0)
MONO#: 0.4 10*3/uL (ref 0.1–0.9)
MONO%: 6.5 % (ref 0.0–14.0)
NEUT#: 4.3 10*3/uL (ref 1.5–6.5)
NEUT%: 72.6 % (ref 39.0–75.0)
Platelets: 261 10*3/uL (ref 140–400)
RBC: 4.01 10*6/uL — ABNORMAL LOW (ref 4.20–5.82)
RDW: 18.6 % — AB (ref 11.0–14.6)
WBC: 5.9 10*3/uL (ref 4.0–10.3)

## 2015-09-18 MED ORDER — SODIUM CHLORIDE 0.9 % IV SOLN
10.0000 mg | Freq: Once | INTRAVENOUS | Status: AC
  Administered 2015-09-18: 10 mg via INTRAVENOUS
  Filled 2015-09-18: qty 1

## 2015-09-18 MED ORDER — PALONOSETRON HCL INJECTION 0.25 MG/5ML
0.2500 mg | Freq: Once | INTRAVENOUS | Status: AC
  Administered 2015-09-18: 0.25 mg via INTRAVENOUS

## 2015-09-18 MED ORDER — VINCRISTINE SULFATE CHEMO INJECTION 1 MG/ML
1.0000 mg | Freq: Once | INTRAVENOUS | Status: AC
  Administered 2015-09-18: 1 mg via INTRAVENOUS
  Filled 2015-09-18: qty 1

## 2015-09-18 MED ORDER — ACETAMINOPHEN 325 MG PO TABS
650.0000 mg | ORAL_TABLET | Freq: Once | ORAL | Status: AC
  Administered 2015-09-18: 650 mg via ORAL

## 2015-09-18 MED ORDER — SODIUM CHLORIDE 0.9 % IV SOLN
375.0000 mg/m2 | Freq: Once | INTRAVENOUS | Status: AC
  Administered 2015-09-18: 800 mg via INTRAVENOUS
  Filled 2015-09-18: qty 50

## 2015-09-18 MED ORDER — HEPARIN SOD (PORK) LOCK FLUSH 100 UNIT/ML IV SOLN
500.0000 [IU] | Freq: Once | INTRAVENOUS | Status: AC | PRN
  Administered 2015-09-18: 500 [IU]
  Filled 2015-09-18: qty 5

## 2015-09-18 MED ORDER — CYCLOPHOSPHAMIDE CHEMO INJECTION 1 GM
375.0000 mg/m2 | Freq: Once | INTRAMUSCULAR | Status: AC
  Administered 2015-09-18: 780 mg via INTRAVENOUS
  Filled 2015-09-18: qty 39

## 2015-09-18 MED ORDER — SODIUM CHLORIDE 0.9 % IV SOLN
Freq: Once | INTRAVENOUS | Status: AC
  Administered 2015-09-18: 09:00:00 via INTRAVENOUS

## 2015-09-18 MED ORDER — PALONOSETRON HCL INJECTION 0.25 MG/5ML
INTRAVENOUS | Status: AC
  Filled 2015-09-18: qty 5

## 2015-09-18 MED ORDER — ACETAMINOPHEN 325 MG PO TABS
ORAL_TABLET | ORAL | Status: AC
  Filled 2015-09-18: qty 2

## 2015-09-18 MED ORDER — DOXORUBICIN HCL CHEMO IV INJECTION 2 MG/ML
25.0000 mg/m2 | Freq: Once | INTRAVENOUS | Status: AC
  Administered 2015-09-18: 52 mg via INTRAVENOUS
  Filled 2015-09-18: qty 26

## 2015-09-18 MED ORDER — DIPHENHYDRAMINE HCL 25 MG PO CAPS
ORAL_CAPSULE | ORAL | Status: AC
  Filled 2015-09-18: qty 2

## 2015-09-18 MED ORDER — DIPHENHYDRAMINE HCL 25 MG PO CAPS
50.0000 mg | ORAL_CAPSULE | Freq: Once | ORAL | Status: AC
  Administered 2015-09-18: 50 mg via ORAL

## 2015-09-18 MED ORDER — SODIUM CHLORIDE 0.9 % IJ SOLN
10.0000 mL | INTRAMUSCULAR | Status: DC | PRN
  Administered 2015-09-18: 10 mL via INTRAVENOUS
  Filled 2015-09-18: qty 10

## 2015-09-18 MED ORDER — SODIUM CHLORIDE 0.9% FLUSH
10.0000 mL | INTRAVENOUS | Status: DC | PRN
  Administered 2015-09-18: 10 mL
  Filled 2015-09-18: qty 10

## 2015-09-18 NOTE — Assessment & Plan Note (Signed)
The patient is currently receiving hemodialysis. The cause of his renal failure is due to polycystic kidney disease. We will order dose adjustment as above 

## 2015-09-18 NOTE — Patient Instructions (Signed)
[facility] Discharge Instructions for Patients Receiving Chemotherapy  Today you received the following chemotherapy agents Adriamycin, Vincristine, Cytoxan, Rituxan To help prevent nausea and vomiting after your treatment, we encourage you to take your nausea medication as directed.   If you develop nausea and vomiting that is not controlled by your nausea medication, call the clinic.   BELOW ARE SYMPTOMS THAT SHOULD BE REPORTED IMMEDIATELY:  *FEVER GREATER THAN 100.5 F  *CHILLS WITH OR WITHOUT FEVER  NAUSEA AND VOMITING THAT IS NOT CONTROLLED WITH YOUR NAUSEA MEDICATION  *UNUSUAL SHORTNESS OF BREATH  *UNUSUAL BRUISING OR BLEEDING  TENDERNESS IN MOUTH AND THROAT WITH OR WITHOUT PRESENCE OF ULCERS  *URINARY PROBLEMS  *BOWEL PROBLEMS  UNUSUAL RASH Items with * indicate a potential emergency and should be followed up as soon as possible.  Feel free to call the clinic you have any questions or concerns. The clinic phone number is [phone].  Please show the CHEMO ALERT CARD at check-in to the Emergency Department and triage nurse.  Doxorubicin injection What is this medicine? DOXORUBICIN (dox oh ROO bi sin) is a chemotherapy drug. It is used to treat many kinds of cancer like Hodgkin's disease, leukemia, non-Hodgkin's lymphoma, neuroblastoma, sarcoma, and Wilms' tumor. It is also used to treat bladder cancer, breast cancer, lung cancer, ovarian cancer, stomach cancer, and thyroid cancer. This medicine may be used for other purposes; ask your health care provider or pharmacist if you have questions. What should I tell my health care provider before I take this medicine? They need to know if you have any of these conditions: -blood disorders -heart disease, recent heart attack -infection (especially a virus infection such as chickenpox, cold sores, or herpes) -irregular heartbeat -liver disease -recent or ongoing radiation therapy -an unusual or allergic  reaction to doxorubicin, other chemotherapy agents, other medicines, foods, dyes, or preservatives -pregnant or trying to get pregnant -breast-feeding How should I use this medicine? This drug is given as an infusion into a vein. It is administered in a hospital or clinic by a specially trained health care professional. If you have pain, swelling, burning or any unusual feeling around the site of your injection, tell your health care professional right away. Talk to your pediatrician regarding the use of this medicine in children. Special care may be needed. Overdosage: If you think you have taken too much of this medicine contact a poison control center or emergency room at once. NOTE: This medicine is only for you. Do not share this medicine with others. What if I miss a dose? It is important not to miss your dose. Call your doctor or health care professional if you are unable to keep an appointment. What may interact with this medicine? Do not take this medicine with any of the following medications: -cisapride -droperidol -halofantrine -pimozide -zidovudine This medicine may also interact with the following medications: -chloroquine -chlorpromazine -clarithromycin -cyclophosphamide -cyclosporine -erythromycin -medicines for depression, anxiety, or psychotic disturbances -medicines for irregular heart beat like amiodarone, bepridil, dofetilide, encainide, flecainide, propafenone, quinidine -medicines for seizures like ethotoin, fosphenytoin, phenytoin -medicines for nausea, vomiting like dolasetron, ondansetron, palonosetron -medicines to increase blood counts like filgrastim, pegfilgrastim, sargramostim -methadone -methotrexate -pentamidine -progesterone -vaccines -verapamil Talk to your doctor or health care professional before taking any of these medicines: -acetaminophen -aspirin -ibuprofen -ketoprofen -naproxen This list may not describe all possible interactions.  Give your health care provider a list of all the medicines, herbs, non-prescription drugs, or dietary supplements you use. Also tell   them if you smoke, drink alcohol, or use illegal drugs. Some items may interact with your medicine. What should I watch for while using this medicine? Your condition will be monitored carefully while you are receiving this medicine. You will need important blood work done while you are taking this medicine. This drug may make you feel generally unwell. This is not uncommon, as chemotherapy can affect healthy cells as well as cancer cells. Report any side effects. Continue your course of treatment even though you feel ill unless your doctor tells you to stop. Your urine may turn red for a few days after your dose. This is not blood. If your urine is dark or brown, call your doctor. In some cases, you may be given additional medicines to help with side effects. Follow all directions for their use. Call your doctor or health care professional for advice if you get a fever, chills or sore throat, or other symptoms of a cold or flu. Do not treat yourself. This drug decreases your body's ability to fight infections. Try to avoid being around people who are sick. This medicine may increase your risk to bruise or bleed. Call your doctor or health care professional if you notice any unusual bleeding. Be careful brushing and flossing your teeth or using a toothpick because you may get an infection or bleed more easily. If you have any dental work done, tell your dentist you are receiving this medicine. Avoid taking products that contain aspirin, acetaminophen, ibuprofen, naproxen, or ketoprofen unless instructed by your doctor. These medicines may hide a fever. Men and women of childbearing age should use effective birth control methods while using taking this medicine. Do not become pregnant while taking this medicine. There is a potential for serious side effects to an unborn child.  Talk to your health care professional or pharmacist for more information. Do not breast-feed an infant while taking this medicine. Do not let others touch your urine or other body fluids for 5 days after each treatment with this medicine. Caregivers should wear latex gloves to avoid touching body fluids during this time. There is a maximum amount of this medicine you should receive throughout your life. The amount depends on the medical condition being treated and your overall health. Your doctor will watch how much of this medicine you receive in your lifetime. Tell your doctor if you have taken this medicine before. What side effects may I notice from receiving this medicine? Side effects that you should report to your doctor or health care professional as soon as possible: -allergic reactions like skin rash, itching or hives, swelling of the face, lips, or tongue -low blood counts - this medicine may decrease the number of white blood cells, red blood cells and platelets. You may be at increased risk for infections and bleeding. -signs of infection - fever or chills, cough, sore throat, pain or difficulty passing urine -signs of decreased platelets or bleeding - bruising, pinpoint red spots on the skin, black, tarry stools, blood in the urine -signs of decreased red blood cells - unusually weak or tired, fainting spells, lightheadedness -breathing problems -chest pain -fast, irregular heartbeat -mouth sores -nausea, vomiting -pain, swelling, redness at site where injected -pain, tingling, numbness in the hands or feet -swelling of ankles, feet, or hands -unusual bleeding or bruising Side effects that usually do not require medical attention (report to your doctor or health care professional if they continue or are bothersome): -diarrhea -facial flushing -hair loss -loss of appetite -  missed menstrual periods -nail discoloration or damage -red or watery eyes -red colored urine -stomach  upset This list may not describe all possible side effects. Call your doctor for medical advice about side effects. You may report side effects to FDA at 1-800-FDA-1088. Where should I keep my medicine? This drug is given in a hospital or clinic and will not be stored at home. NOTE: This sheet is a summary. It may not cover all possible information. If you have questions about this medicine, talk to your doctor, pharmacist, or health care provider.    2016, Elsevier/[employer] Standard. (2012-05-12 09:54:34)  Vincristine injection What is this medicine? VINCRISTINE (vin KRIS teen) is a chemotherapy drug. It slows the growth of cancer cells. This medicine is used to treat many types of cancer like Hodgkin's disease, leukemia, non-Hodgkin's lymphoma, neuroblastoma (brain cancer), rhabdomyosarcoma, and Wilms' tumor. This medicine may be used for other purposes; ask your health care provider or pharmacist if you have questions. What should I tell my health care provider before I take this medicine? They need to know if you have any of these conditions: -blood disorders -gout -infection (especially chickenpox, cold sores, or herpes) -kidney disease -liver disease -lung disease -nervous system disease like Charcot-Marie-Tooth (CMT) -recent or ongoing radiation therapy -an unusual or allergic reaction to vincristine, other chemotherapy agents, other medicines, foods, dyes, or preservatives -pregnant or trying to get pregnant -breast-feeding How should I use this medicine? This drug is given as an infusion into a vein. It is administered in a hospital or clinic by a specially trained health care professional. If you have pain, swelling, burning, or any unusual feeling around the site of your injection, tell your health care professional right away. Talk to your pediatrician regarding the use of this medicine in children. While this drug may be prescribed for selected conditions, precautions do  apply. Overdosage: If you think you have taken too much of this medicine contact a poison control center or emergency room at once. NOTE: This medicine is only for you. Do not share this medicine with others. What if I miss a dose? It is important not to miss your dose. Call your doctor or health care professional if you are unable to keep an appointment. What may interact with this medicine? Do not take this medicine with any of the following medications: -itraconazole -mibefradil -voriconazole This medicine may also interact with the following medications: -cyclosporine -erythromycin -fluconazole -ketoconazole -medicines for HIV like delavirdine, efavirenz, nevirapine -medicines for seizures like ethotoin, fosphenotoin, phenytoin -medicines to increase blood counts like filgrastim, pegfilgrastim, sargramostim -other chemotherapy drugs like cisplatin, L-asparaginase, methotrexate, mitomycin, paclitaxel -pegaspargase -vaccines -zalcitabine, ddC Talk to your doctor or health care professional before taking any of these medicines: -acetaminophen -aspirin -ibuprofen -ketoprofen -naproxen This list may not describe all possible interactions. Give your health care provider a list of all the medicines, herbs, non-prescription drugs, or dietary supplements you use. Also tell them if you smoke, drink alcohol, or use illegal drugs. Some items may interact with your medicine. What should I watch for while using this medicine? Your condition will be monitored carefully while you are receiving this medicine. You will need important blood work done while you are taking this medicine. This drug may make you feel generally unwell. This is not uncommon, as chemotherapy can affect healthy cells as well as cancer cells. Report any side effects. Continue your course of treatment even though you feel ill unless your doctor tells you to stop. In some cases,   you may be given additional medicines to help  with side effects. Follow all directions for their use. Call your doctor or health care professional for advice if you get a fever, chills or sore throat, or other symptoms of a cold or flu. Do not treat yourself. Avoid taking products that contain aspirin, acetaminophen, ibuprofen, naproxen, or ketoprofen unless instructed by your doctor. These medicines may hide a fever. Do not become pregnant while taking this medicine. Women should inform their doctor if they wish to become pregnant or think they might be pregnant. There is a potential for serious side effects to an unborn child. Talk to your health care professional or pharmacist for more information. Do not breast-feed an infant while taking this medicine. Men may have a lower sperm count while taking this medicine. Talk to your doctor if you plan to father a child. What side effects may I notice from receiving this medicine? Side effects that you should report to your doctor or health care professional as soon as possible: -allergic reactions like skin rash, itching or hives, swelling of the face, lips, or tongue -breathing problems -confusion or changes in emotions or moods -constipation -cough -mouth sores -muscle weakness -nausea and vomiting -pain, swelling, redness or irritation at the injection site -pain, tingling, numbness in the hands or feet -problems with balance, talking, walking -seizures -stomach pain -trouble passing urine or change in the amount of urine Side effects that usually do not require medical attention (report to your doctor or health care professional if they continue or are bothersome): -diarrhea -hair loss -jaw pain -loss of appetite This list may not describe all possible side effects. Call your doctor for medical advice about side effects. You may report side effects to FDA at 1-800-FDA-1088. Where should I keep my medicine? This drug is given in a hospital or clinic and will not be stored at  home. NOTE: This sheet is a summary. It may not cover all possible information. If you have questions about this medicine, talk to your doctor, pharmacist, or health care provider.    2016, [employer]vier/Gold Standard. (2007-10-12 17:17:13) Cyclophosphamide injection What is this medicine? CYCLOPHOSPHAMIDE (sye kloe FOSS fa mide) is a chemotherapy drug. It slows the growth of cancer cells. This medicine is used to treat many types of cancer like lymphoma, myeloma, leukemia, breast cancer, and ovarian cancer, to name a few. This medicine may be used for other purposes; ask your health care provider or pharmacist if you have questions. What should I tell my health care provider before I take this medicine? They need to know if you have any of these conditions: -blood disorders -history of other chemotherapy -infection -kidney disease -liver disease -recent or ongoing radiation therapy -tumors in the bone marrow -an unusual or allergic reaction to cyclophosphamide, other chemotherapy, other medicines, foods, dyes, or preservatives -pregnant or trying to get pregnant -breast-feeding How should I use this medicine? This drug is usually given as an injection into a vein or muscle or by infusion into a vein. It is administered in a hospital or clinic by a specially trained health care professional. Talk to your pediatrician regarding the use of this medicine in children. Special care may be needed. Overdosage: If you think you have taken too much of this medicine contact a poison control center or emergency room at once. NOTE: This medicine is only for you. Do not share this medicine with others. What if I miss a dose? It is important not to miss your   dose. Call your doctor or health care professional if you are unable to keep an appointment. What may interact with this medicine? This medicine may interact with the following medications: -amiodarone -amphotericin B -azathioprine -certain  antiviral medicines for HIV or AIDS such as protease inhibitors (e.g., indinavir, ritonavir) and zidovudine -certain blood pressure medications such as benazepril, captopril, enalapril, fosinopril, lisinopril, moexipril, monopril, perindopril, quinapril, ramipril, trandolapril -certain cancer medications such as anthracyclines (e.g., daunorubicin, doxorubicin), busulfan, cytarabine, paclitaxel, pentostatin, tamoxifen, trastuzumab -certain diuretics such as chlorothiazide, chlorthalidone, hydrochlorothiazide, indapamide, metolazone -certain medicines that treat or prevent blood clots like warfarin -certain muscle relaxants such as succinylcholine -cyclosporine -etanercept -indomethacin -medicines to increase blood counts like filgrastim, pegfilgrastim, sargramostim -medicines used as general anesthesia -metronidazole -natalizumab This list may not describe all possible interactions. Give your health care provider a list of all the medicines, herbs, non-prescription drugs, or dietary supplements you use. Also tell them if you smoke, drink alcohol, or use illegal drugs. Some items may interact with your medicine. What should I watch for while using this medicine? Visit your doctor for checks on your progress. This drug may make you feel generally unwell. This is not uncommon, as chemotherapy can affect healthy cells as well as cancer cells. Report any side effects. Continue your course of treatment even though you feel ill unless your doctor tells you to stop. Drink water or other fluids as directed. Urinate often, even at night. In some cases, you may be given additional medicines to help with side effects. Follow all directions for their use. Call your doctor or health care professional for advice if you get a fever, chills or sore throat, or other symptoms of a cold or flu. Do not treat yourself. This drug decreases your body's ability to fight infections. Try to avoid being around people who are  sick. This medicine may increase your risk to bruise or bleed. Call your doctor or health care professional if you notice any unusual bleeding. Be careful brushing and flossing your teeth or using a toothpick because you may get an infection or bleed more easily. If you have any dental work done, tell your dentist you are receiving this medicine. You may get drowsy or dizzy. Do not drive, use machinery, or do anything that needs mental alertness until you know how this medicine affects you. Do not become pregnant while taking this medicine or for 1 year after stopping it. Women should inform their doctor if they wish to become pregnant or think they might be pregnant. Men should not father a child while taking this medicine and for 4 months after stopping it. There is a potential for serious side effects to an unborn child. Talk to your health care professional or pharmacist for more information. Do not breast-feed an infant while taking this medicine. This medicine may interfere with the ability to have a child. This medicine has caused ovarian failure in some women. This medicine has caused reduced sperm counts in some men. You should talk with your doctor or health care professional if you are concerned about your fertility. If you are going to have surgery, tell your doctor or health care professional that you have taken this medicine. What side effects may I notice from receiving this medicine? Side effects that you should report to your doctor or health care professional as soon as possible: -allergic reactions like skin rash, itching or hives, swelling of the face, lips, or tongue -low blood counts - this medicine may decrease the number   of white blood cells, red blood cells and platelets. You may be at increased risk for infections and bleeding. -signs of infection - fever or chills, cough, sore throat, pain or difficulty passing urine -signs of decreased platelets or bleeding - bruising,  pinpoint red spots on the skin, black, tarry stools, blood in the urine -signs of decreased red blood cells - unusually weak or tired, fainting spells, lightheadedness -breathing problems -dark urine -dizziness -palpitations -swelling of the ankles, feet, hands -trouble passing urine or change in the amount of urine -weight gain -yellowing of the eyes or skin Side effects that usually do not require medical attention (report to your doctor or health care professional if they continue or are bothersome): -changes in nail or skin color -hair loss -missed menstrual periods -mouth sores -nausea, vomiting This list may not describe all possible side effects. Call your doctor for medical advice about side effects. You may report side effects to FDA at 1-800-FDA-1088. Where should I keep my medicine? This drug is given in a hospital or clinic and will not be stored at home. NOTE: This sheet is a summary. It may not cover all possible information. If you have questions about this medicine, talk to your doctor, pharmacist, or health care provider.    2016, [employer]/Gold Standard. (2011-11-29 16:22:58)   Rituximab injection What is this medicine? RITUXIMAB (ri TUX i mab) is a monoclonal antibody. It is used commonly to treat non-Hodgkin lymphoma and other conditions. It is also used to treat rheumatoid arthritis (RA). In RA, this medicine slows the inflammatory process and help reduce joint pain and swelling. This medicine is often used with other cancer or arthritis medications. This medicine may be used for other purposes; ask your health care provider or pharmacist if you have questions. What should I tell my health care provider before I take this medicine? They need to know if you have any of these conditions: -blood disorders -heart disease -history of hepatitis B -infection (especially a virus infection such as chickenpox, cold sores, or herpes) -irregular heartbeat -kidney  disease -lung or breathing disease, like asthma -lupus -an unusual or allergic reaction to rituximab, mouse proteins, other medicines, foods, dyes, or preservatives -pregnant or trying to get pregnant -breast-feeding How should I use this medicine? This medicine is for infusion into a vein. It is administered in a hospital or clinic by a specially trained health care professional. A special MedGuide will be given to you by the pharmacist with each prescription and refill. Be sure to read this information carefully each time. Talk to your pediatrician regarding the use of this medicine in children. This medicine is not approved for use in children. Overdosage: If you think you have taken too much of this medicine contact a poison control center or emergency room at once. NOTE: This medicine is only for you. Do not share this medicine with others. What if I miss a dose? It is important not to miss a dose. Call your doctor or health care professional if you are unable to keep an appointment. What may interact with this medicine? -cisplatin -medicines for blood pressure -some other medicines for arthritis -vaccines This list may not describe all possible interactions. Give your health care provider a list of all the medicines, herbs, non-prescription drugs, or dietary supplements you use. Also tell them if you smoke, drink alcohol, or use illegal drugs. Some items may interact with your medicine. What should I watch for while using this medicine? Report any side effects   that you notice during your treatment right away, such as changes in your breathing, fever, chills, dizziness or lightheadedness. These effects are more common with the first dose. Visit your prescriber or health care professional for checks on your progress. You will need to have regular blood work. Report any other side effects. The side effects of this medicine can continue after you finish your treatment. Continue your course of  treatment even though you feel ill unless your doctor tells you to stop. Call your doctor or health care professional for advice if you get a fever, chills or sore throat, or other symptoms of a cold or flu. Do not treat yourself. This drug decreases your body's ability to fight infections. Try to avoid being around people who are sick. This medicine may increase your risk to bruise or bleed. Call your doctor or health care professional if you notice any unusual bleeding. Be careful brushing and flossing your teeth or using a toothpick because you may get an infection or bleed more easily. If you have any dental work done, tell your dentist you are receiving this medicine. Avoid taking products that contain aspirin, acetaminophen, ibuprofen, naproxen, or ketoprofen unless instructed by your doctor. These medicines may hide a fever. Do not become pregnant while taking this medicine. Women should inform their doctor if they wish to become pregnant or think they might be pregnant. There is a potential for serious side effects to an unborn child. Talk to your health care professional or pharmacist for more information. Do not breast-feed an infant while taking this medicine. What side effects may I notice from receiving this medicine? Side effects that you should report to your doctor or health care professional as soon as possible: -allergic reactions like skin rash, itching or hives, swelling of the face, lips, or tongue -low blood counts - this medicine may decrease the number of white blood cells, red blood cells and platelets. You may be at increased risk for infections and bleeding. -signs of infection - fever or chills, cough, sore throat, pain or difficulty passing urine -signs of decreased platelets or bleeding - bruising, pinpoint red spots on the skin, black, tarry stools, blood in the urine -signs of decreased red blood cells - unusually weak or tired, fainting spells, lightheadedness -breathing  problems -confused, not responsive -chest pain -fast, irregular heartbeat -feeling faint or lightheaded, falls -mouth sores -redness, blistering, peeling or loosening of the skin, including inside the mouth -stomach pain -swelling of the ankles, feet, or hands -trouble passing urine or change in the amount of urine Side effects that usually do not require medical attention (report to your doctor or other health care professional if they continue or are bothersome): -anxiety -headache -loss of appetite -muscle aches -nausea -night sweats This list may not describe all possible side effects. Call your doctor for medical advice about side effects. You may report side effects to FDA at 1-800-FDA-1088. Where should I keep my medicine? This drug is given in a hospital or clinic and will not be stored at home. NOTE: This sheet is a summary. It may not cover all possible information. If you have questions about this medicine, talk to your doctor, pharmacist, or health care provider.    2016, Elsevier/Gold Standard. (2014-03-23 22:30:56)  

## 2015-09-18 NOTE — Telephone Encounter (Signed)
GAVE PATIENT RELATIVE AVS REPORT AND APPOINTMENTS FOR AUGUST AND September.

## 2015-09-18 NOTE — Assessment & Plan Note (Signed)
He has evidence of dermatitis near the site of his dialysis. He has been evaluated by nephrologist with dermatology appointment pending. Clinically, it does not look like cellulitis. We will proceed with treatment today and he will continue to take over-the-counter Benadryl as needed for symptoms

## 2015-09-18 NOTE — Assessment & Plan Note (Signed)
He tolerated treatment well without major side effects. PET/CT scan from 08/25/2015 show near complete response to treatment I will proceed with cycle 5 of treatment with similar dose adjustment. Plan for total 6 cycles of treatment

## 2015-09-18 NOTE — Assessment & Plan Note (Signed)
The patient had history of fall 4 months ago with subdural hematoma. Since then, he is able to manage at home without further falls. He continues to use cane whenever he walks.

## 2015-09-18 NOTE — Assessment & Plan Note (Signed)
This is likely anemia of chronic disease. The patient denies recent history of bleeding such as epistaxis, hematuria or hematochezia. He is asymptomatic from the anemia. We will observe for now.  He does not require transfusion now.  I would defer ESA management to nephrologist. 

## 2015-09-18 NOTE — Progress Notes (Signed)
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[place])      06/12/2015 Imaging    CT chest showed lymphadenopathy within the low neck and low chest, consistent with active lymphoma      06/14/2015 Procedure    He underwent CT-guided biopsy      06/14/2015 Pathology Results    Biopsy of the retroperitoneal mass confirmed diffuse large B-cell lymphoma      06/21/2015 Imaging    ECHO showed normal EF      06/21/2015 Procedure    He has port placement      06/23/2015 -  Chemotherapy    He received mini-RCHOP chemo      08/25/2015 PET scan    Significant interval decrease in size of the bulky retroperitoneal lymphadenopathy suggesting an excellent response to therapy. Residual matted soft tissue density in the retroperitoneum with SUV max of approximately 3.3       INTERVAL HISTORY: Please see below for problem oriented charting. He is seen prior to cycle 5 of chemotherapy He tolerated previous treatment well without any side effects. No rcent mucositis, nausea, change in bowel habits or infection Denies peripheral neuropathy He developed new skin rash around his dialysis sites that is itchy. He has been evaluated at the dialysis center with dermatology appointment pending. In the meantime, he is taking Benadryl as needed for itching  REVIEW OF SYSTEMS:   Constitutional: Denies fevers, chills or abnormal weight loss Eyes: Denies blurriness of vision Ears, nose, mouth, throat, and face: Denies mucositis or sore throat Respiratory: Denies cough, dyspnea or wheezes Cardiovascular: Denies  palpitation, chest discomfort or lower extremity swelling Gastrointestinal:  Denies nausea, heartburn or change in bowel habits Lymphatics: Denies new lymphadenopathy or easy bruising Neurological:Denies numbness, tingling or new weaknesses Behavioral/Psych: Mood is stable, no new changes  All other systems were reviewed with the patient and are negative.  I have reviewed the past medical history, past surgical history, social history and family history with the patient and they are unchanged from previous note.  ALLERGIES:  has No Known Allergies.  MEDICATIONS:  Current Outpatient Prescriptions  Medication Sig Dispense Refill  . aspirin 325 MG tablet Take 1 tablet (325 mg total) by mouth daily. 30 tablet 0  . calcium acetate (PHOSLO) 667 MG capsule Take 1 capsule by mouth 2 (two) times daily.   11  . cloNIDine (CATAPRES) 0.2 MG tablet Take 0.4 mg by mouth 2 (two) times daily.     Marland Kitchen docusate sodium (COLACE) 100 MG capsule Take 1 capsule (100 mg total) by mouth every 12 (twelve) hours. (Patient taking differently: Take 100 mg by mouth every 12 (twelve) hours. ) 60 capsule 0  . isosorbide-hydrALAZINE (BIDIL) 20-37.5 MG tablet Take 0.5 tablets by mouth 2 (two) times daily.    Marland Kitchen levETIRAcetam (KEPPRA) 500 MG tablet Take 1 tablet (500 mg total) by mouth 2 (two) times daily. 60 tablet 6  . levothyroxine (SYNTHROID, LEVOTHROID) 25 MCG tablet Take 25 mcg by mouth daily.   0  . lidocaine-prilocaine (EMLA) cream Apply to affected area once 30 g 3  . metoprolol (LOPRESSOR) 100 MG tablet Take 150  mg by mouth 2 (two) times daily.     . multivitamin (RENA-VIT) TABS tablet Take 1 tablet by mouth at bedtime. 90 tablet 0  . OVER THE COUNTER MEDICATION Take 1 capsule by mouth 2 (two) times daily. "Kidney Factor"    . predniSONE (DELTASONE) 20 MG tablet Take 3 tablets (60 mg total) by mouth daily. Take on days 2-5 of chemotherapy. 12 tablet 6  . ondansetron (ZOFRAN ODT) 8 MG disintegrating tablet Take 1  tablet (8 mg total) by mouth every 8 (eight) hours as needed for nausea or vomiting. (Patient not taking: Reported on 08/04/2015) 12 tablet 0  . oxyCODONE-acetaminophen (PERCOCET/ROXICET) 5-325 MG tablet Take 1 tablet by mouth every 4 (four) hours as needed for moderate pain. (Patient not taking: Reported on 08/04/2015) 90 tablet 0  . pentafluoroprop-tetrafluoroeth (GEBAUERS) AERO Apply 1 application topically as needed (topical anesthesia for hemodialysis). (Patient not taking: Reported on 09/18/2015) 30 mL 0  . prochlorperazine (COMPAZINE) 10 MG tablet Take 1 tablet (10 mg total) by mouth every 6 (six) hours as needed (Nausea or vomiting). (Patient not taking: Reported on 08/04/2015) 30 tablet 6   No current facility-administered medications for this visit.     PHYSICAL EXAMINATION: ECOG PERFORMANCE STATUS: 1 - Symptomatic but completely ambulatory  Vitals:   09/18/15 0847  BP: 110/70  Pulse: (!) 53  Resp: 18  Temp: 97.8 F (36.6 C)   Filed Weights   09/18/15 0847  Weight: 185 lb 6.4 oz (84.1 kg)    GENERAL:alert, no distress and comfortable SKIN: Noted a rash around his left antecubital area resembling dermatitis  EYES: normal, Conjunctiva are pink and non-injected, sclera clear OROPHARYNX:no exudate, no erythema and lips, buccal mucosa, and tongue normal  NECK: supple, thyroid normal size, non-tender, without nodularity LYMPH:  no palpable lymphadenopathy in the cervical, axillary or inguinal LUNGS: clear to auscultation and percussion with normal breathing effort HEART: regular rate & rhythm and no murmurs and no lower extremity edema ABDOMEN:abdomen soft, non-tender and normal bowel sounds Musculoskeletal:no cyanosis of digits and no clubbing  NEURO: alert & oriented x 3 with fluent speech, with left hemiparesis  LABORATORY DATA:  I have reviewed the data as listed    Component Value Date/Time   NA 140 08/25/2015 0828   K 4.0 08/25/2015 0828   CL 95 (L) 06/21/2015 1345   CO2  27 08/25/2015 0828   GLUCOSE 171 (H) 08/25/2015 0828   BUN 28.7 (H) 08/25/2015 0828   CREATININE 5.4 (HH) 08/25/2015 0828   CALCIUM 9.0 08/25/2015 0828   PROT 6.9 08/25/2015 0828   ALBUMIN 3.3 (L) 08/25/2015 0828   AST 25 08/25/2015 0828   ALT 16 08/25/2015 0828   ALKPHOS 96 08/25/2015 0828   BILITOT 0.35 08/25/2015 0828   GFRNONAA 8 (L) 06/21/2015 1345   GFRAA 9 (L) 06/21/2015 1345    No results found for: SPEP, UPEP  Lab Results  Component Value Date   WBC 5.9 09/18/2015   NEUTROABS 4.3 09/18/2015   HGB 12.4 (L) 09/18/2015   HCT 38.9 09/18/2015   MCV 96.8 09/18/2015   PLT 261 09/18/2015      Chemistry      Component Value Date/Time   NA 140 08/25/2015 0828   K 4.0 08/25/2015 0828   CL 95 (L) 06/21/2015 1345   CO2 27 08/25/2015 0828   BUN 28.7 (H) 08/25/2015 0828   CREATININE 5.4 (HH) 08/25/2015 0828      Component Value Date/Time   CALCIUM 9.0 08/25/2015  0828   ALKPHOS 96 08/25/2015 0828   AST 25 08/25/2015 0828   ALT 16 08/25/2015 0828   BILITOT 0.35 08/25/2015 0828     ASSESSMENT & PLAN:  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 PET/CT scan from 08/25/2015 show near complete response to treatment I will proceed with cycle 5 of treatment with similar dose adjustment. Plan for total 6 cycles of treatment  End stage renal failure on dialysis [facility]) The patient is currently receiving hemodialysis. The cause of his renal failure is due to polycystic kidney disease. We will order dose adjustment as above  Anemia in chronic kidney disease This is likely anemia of chronic disease. The patient denies recent history of bleeding such as epistaxis, hematuria or hematochezia. He is asymptomatic from the anemia. We will observe for now.  He does not require transfusion now.  I would defer ESA management to nephrologist.  Hemiparesis affecting right side as late effect of stroke  [facility]) The patient had history of fall 4 months ago with subdural hematoma. Since then, he is able to manage at home without further falls. He continues to use cane whenever he walks.   Dermatitis due to unknown cause He has evidence of dermatitis near the site of his dialysis. He has been evaluated by nephrologist with dermatology appointment pending. Clinically, it does not look like cellulitis. We will proceed with treatment today and he will continue to take over-the-counter Benadryl as needed for symptoms   No orders of the defined types were placed in this encounter.  All questions were answered. The patient knows to call the clinic with any problems, questions or concerns. No barriers to learning was detected. I spent 25 minutes counseling the patient face to face. The total time spent in the appointment was 30 minutes and more than 50% was on counseling and review of test results     [facility], [place], MD 09/18/2015 9:02 AM

## 2015-09-19 DIAGNOSIS — N2581 Secondary hyperparathyroidism of renal origin: Secondary | ICD-10-CM | POA: Diagnosis not present

## 2015-09-19 DIAGNOSIS — D631 Anemia in chronic kidney disease: Secondary | ICD-10-CM | POA: Diagnosis not present

## 2015-09-19 DIAGNOSIS — N186 End stage renal disease: Secondary | ICD-10-CM | POA: Diagnosis not present

## 2015-09-19 DIAGNOSIS — E119 Type 2 diabetes mellitus without complications: Secondary | ICD-10-CM | POA: Diagnosis not present

## 2015-09-20 ENCOUNTER — Ambulatory Visit (HOSPITAL_BASED_OUTPATIENT_CLINIC_OR_DEPARTMENT_OTHER): Payer: Medicare Other

## 2015-09-20 VITALS — BP 133/84 | HR 60 | Temp 97.6°F | Resp 16

## 2015-09-20 DIAGNOSIS — Z5189 Encounter for other specified aftercare: Secondary | ICD-10-CM

## 2015-09-20 DIAGNOSIS — C8338 Diffuse large B-cell lymphoma, lymph nodes of multiple sites: Secondary | ICD-10-CM

## 2015-09-20 MED ORDER — PEGFILGRASTIM INJECTION 6 MG/0.6ML ~~LOC~~
6.0000 mg | PREFILLED_SYRINGE | Freq: Once | SUBCUTANEOUS | Status: AC
  Administered 2015-09-20: 6 mg via SUBCUTANEOUS
  Filled 2015-09-20: qty 0.6

## 2015-09-20 NOTE — Patient Instructions (Signed)
Pegfilgrastim injection What is this medicine? PEGFILGRASTIM (PEG fil gra stim) is a long-acting granulocyte colony-stimulating factor that stimulates the growth of neutrophils, a type of white blood cell important in the body's fight against infection. It is used to reduce the incidence of fever and infection in patients with certain types of cancer who are receiving chemotherapy that affects the bone marrow, and to increase survival after being exposed to high doses of radiation. This medicine may be used for other purposes; ask your health care provider or pharmacist if you have questions. What should I tell my health care provider before I take this medicine? They need to know if you have any of these conditions: -kidney disease -latex allergy -ongoing radiation therapy -sickle cell disease -skin reactions to acrylic adhesives (On-Body Injector only) -an unusual or allergic reaction to pegfilgrastim, filgrastim, other medicines, foods, dyes, or preservatives -pregnant or trying to get pregnant -breast-feeding How should I use this medicine? This medicine is for injection under the skin. If you get this medicine at home, you will be taught how to prepare and give the pre-filled syringe or how to use the On-body Injector. Refer to the patient Instructions for Use for detailed instructions. Use exactly as directed. Take your medicine at regular intervals. Do not take your medicine more often than directed. It is important that you put your used needles and syringes in a special sharps container. Do not put them in a trash can. If you do not have a sharps container, call your pharmacist or healthcare provider to get one. Talk to your pediatrician regarding the use of this medicine in children. While this drug may be prescribed for selected conditions, precautions do apply. Overdosage: If you think you have taken too much of this medicine contact a poison control center or emergency room at  once. NOTE: This medicine is only for you. Do not share this medicine with others. What if I miss a dose? It is important not to miss your dose. Call your doctor or health care professional if you miss your dose. If you miss a dose due to an On-body Injector failure or leakage, a new dose should be administered as soon as possible using a single prefilled syringe for manual use. What may interact with this medicine? Interactions have not been studied. Give your health care provider a list of all the medicines, herbs, non-prescription drugs, or dietary supplements you use. Also tell them if you smoke, drink alcohol, or use illegal drugs. Some items may interact with your medicine. This list may not describe all possible interactions. Give your health care provider a list of all the medicines, herbs, non-prescription drugs, or dietary supplements you use. Also tell them if you smoke, drink alcohol, or use illegal drugs. Some items may interact with your medicine. What should I watch for while using this medicine? You may need blood work done while you are taking this medicine. If you are going to need a MRI, CT scan, or other procedure, tell your doctor that you are using this medicine (On-Body Injector only). What side effects may I notice from receiving this medicine? Side effects that you should report to your doctor or health care professional as soon as possible: -allergic reactions like skin rash, itching or hives, swelling of the face, lips, or tongue -dizziness -fever -pain, redness, or irritation at site where injected -pinpoint red spots on the skin -red or dark-brown urine -shortness of breath or breathing problems -stomach or side pain, or pain   at the shoulder -swelling -tiredness -trouble passing urine or change in the amount of urine Side effects that usually do not require medical attention (report to your doctor or health care professional if they continue or are  bothersome): -bone pain -muscle pain This list may not describe all possible side effects. Call your doctor for medical advice about side effects. You may report side effects to FDA at 1-800-FDA-1088. Where should I keep my medicine? Keep out of the reach of children. Store pre-filled syringes in a refrigerator between 2 and 8 degrees C (36 and 46 degrees F). Do not freeze. Keep in carton to protect from light. Throw away this medicine if it is left out of the refrigerator for more than 48 hours. Throw away any unused medicine after the expiration date. NOTE: This sheet is a summary. It may not cover all possible information. If you have questions about this medicine, talk to your doctor, pharmacist, or health care provider.    2016, [employer]vier/[employer] Standard. (2014-02-03 14:30:14)  

## 2015-09-21 DIAGNOSIS — N186 End stage renal disease: Secondary | ICD-10-CM | POA: Diagnosis not present

## 2015-09-21 DIAGNOSIS — E119 Type 2 diabetes mellitus without complications: Secondary | ICD-10-CM | POA: Diagnosis not present

## 2015-09-21 DIAGNOSIS — N2581 Secondary hyperparathyroidism of renal origin: Secondary | ICD-10-CM | POA: Diagnosis not present

## 2015-09-21 DIAGNOSIS — D631 Anemia in chronic kidney disease: Secondary | ICD-10-CM | POA: Diagnosis not present

## 2015-09-23 DIAGNOSIS — N2581 Secondary hyperparathyroidism of renal origin: Secondary | ICD-10-CM | POA: Diagnosis not present

## 2015-09-23 DIAGNOSIS — N186 End stage renal disease: Secondary | ICD-10-CM | POA: Diagnosis not present

## 2015-09-23 DIAGNOSIS — E119 Type 2 diabetes mellitus without complications: Secondary | ICD-10-CM | POA: Diagnosis not present

## 2015-09-23 DIAGNOSIS — D631 Anemia in chronic kidney disease: Secondary | ICD-10-CM | POA: Diagnosis not present

## 2015-09-26 DIAGNOSIS — N2581 Secondary hyperparathyroidism of renal origin: Secondary | ICD-10-CM | POA: Diagnosis not present

## 2015-09-26 DIAGNOSIS — D631 Anemia in chronic kidney disease: Secondary | ICD-10-CM | POA: Diagnosis not present

## 2015-09-26 DIAGNOSIS — N186 End stage renal disease: Secondary | ICD-10-CM | POA: Diagnosis not present

## 2015-09-26 DIAGNOSIS — E119 Type 2 diabetes mellitus without complications: Secondary | ICD-10-CM | POA: Diagnosis not present

## 2015-09-28 DIAGNOSIS — N2581 Secondary hyperparathyroidism of renal origin: Secondary | ICD-10-CM | POA: Diagnosis not present

## 2015-09-28 DIAGNOSIS — Z992 Dependence on renal dialysis: Secondary | ICD-10-CM | POA: Diagnosis not present

## 2015-09-28 DIAGNOSIS — D631 Anemia in chronic kidney disease: Secondary | ICD-10-CM | POA: Diagnosis not present

## 2015-09-28 DIAGNOSIS — E119 Type 2 diabetes mellitus without complications: Secondary | ICD-10-CM | POA: Diagnosis not present

## 2015-09-28 DIAGNOSIS — I129 Hypertensive chronic kidney disease with stage 1 through stage 4 chronic kidney disease, or unspecified chronic kidney disease: Secondary | ICD-10-CM | POA: Diagnosis not present

## 2015-09-28 DIAGNOSIS — N186 End stage renal disease: Secondary | ICD-10-CM | POA: Diagnosis not present

## 2015-09-30 DIAGNOSIS — N2581 Secondary hyperparathyroidism of renal origin: Secondary | ICD-10-CM | POA: Diagnosis not present

## 2015-09-30 DIAGNOSIS — N186 End stage renal disease: Secondary | ICD-10-CM | POA: Diagnosis not present

## 2015-09-30 DIAGNOSIS — E119 Type 2 diabetes mellitus without complications: Secondary | ICD-10-CM | POA: Diagnosis not present

## 2015-10-03 DIAGNOSIS — N2581 Secondary hyperparathyroidism of renal origin: Secondary | ICD-10-CM | POA: Diagnosis not present

## 2015-10-03 DIAGNOSIS — N186 End stage renal disease: Secondary | ICD-10-CM | POA: Diagnosis not present

## 2015-10-03 DIAGNOSIS — E119 Type 2 diabetes mellitus without complications: Secondary | ICD-10-CM | POA: Diagnosis not present

## 2015-10-05 DIAGNOSIS — E119 Type 2 diabetes mellitus without complications: Secondary | ICD-10-CM | POA: Diagnosis not present

## 2015-10-05 DIAGNOSIS — N186 End stage renal disease: Secondary | ICD-10-CM | POA: Diagnosis not present

## 2015-10-05 DIAGNOSIS — N2581 Secondary hyperparathyroidism of renal origin: Secondary | ICD-10-CM | POA: Diagnosis not present

## 2015-10-07 DIAGNOSIS — N186 End stage renal disease: Secondary | ICD-10-CM | POA: Diagnosis not present

## 2015-10-07 DIAGNOSIS — E119 Type 2 diabetes mellitus without complications: Secondary | ICD-10-CM | POA: Diagnosis not present

## 2015-10-07 DIAGNOSIS — N2581 Secondary hyperparathyroidism of renal origin: Secondary | ICD-10-CM | POA: Diagnosis not present

## 2015-10-10 DIAGNOSIS — N186 End stage renal disease: Secondary | ICD-10-CM | POA: Diagnosis not present

## 2015-10-10 DIAGNOSIS — E119 Type 2 diabetes mellitus without complications: Secondary | ICD-10-CM | POA: Diagnosis not present

## 2015-10-10 DIAGNOSIS — N2581 Secondary hyperparathyroidism of renal origin: Secondary | ICD-10-CM | POA: Diagnosis not present

## 2015-10-11 ENCOUNTER — Telehealth: Payer: Self-pay | Admitting: Hematology and Oncology

## 2015-10-11 ENCOUNTER — Ambulatory Visit (HOSPITAL_BASED_OUTPATIENT_CLINIC_OR_DEPARTMENT_OTHER): Payer: Medicare Other | Admitting: Hematology and Oncology

## 2015-10-11 ENCOUNTER — Other Ambulatory Visit (HOSPITAL_BASED_OUTPATIENT_CLINIC_OR_DEPARTMENT_OTHER): Payer: Medicare Other

## 2015-10-11 ENCOUNTER — Ambulatory Visit (HOSPITAL_BASED_OUTPATIENT_CLINIC_OR_DEPARTMENT_OTHER): Payer: Medicare Other

## 2015-10-11 ENCOUNTER — Encounter: Payer: Self-pay | Admitting: Hematology and Oncology

## 2015-10-11 ENCOUNTER — Ambulatory Visit: Payer: Medicare Other

## 2015-10-11 VITALS — BP 118/80 | HR 55 | Temp 97.7°F | Resp 18

## 2015-10-11 VITALS — BP 110/77 | HR 62 | Temp 98.2°F | Resp 17 | Ht 71.0 in | Wt 187.3 lb

## 2015-10-11 DIAGNOSIS — C8338 Diffuse large B-cell lymphoma, lymph nodes of multiple sites: Secondary | ICD-10-CM

## 2015-10-11 DIAGNOSIS — N186 End stage renal disease: Secondary | ICD-10-CM

## 2015-10-11 DIAGNOSIS — Z5111 Encounter for antineoplastic chemotherapy: Secondary | ICD-10-CM

## 2015-10-11 DIAGNOSIS — D649 Anemia, unspecified: Secondary | ICD-10-CM

## 2015-10-11 DIAGNOSIS — N189 Chronic kidney disease, unspecified: Secondary | ICD-10-CM

## 2015-10-11 DIAGNOSIS — D631 Anemia in chronic kidney disease: Secondary | ICD-10-CM

## 2015-10-11 DIAGNOSIS — Z95828 Presence of other vascular implants and grafts: Secondary | ICD-10-CM

## 2015-10-11 DIAGNOSIS — L309 Dermatitis, unspecified: Secondary | ICD-10-CM

## 2015-10-11 DIAGNOSIS — Z992 Dependence on renal dialysis: Secondary | ICD-10-CM

## 2015-10-11 DIAGNOSIS — Q613 Polycystic kidney, unspecified: Secondary | ICD-10-CM | POA: Diagnosis not present

## 2015-10-11 DIAGNOSIS — Z5112 Encounter for antineoplastic immunotherapy: Secondary | ICD-10-CM

## 2015-10-11 LAB — CBC WITH DIFFERENTIAL/PLATELET
BASO%: 2.6 % — AB (ref 0.0–2.0)
Basophils Absolute: 0.1 10*3/uL (ref 0.0–0.1)
EOS%: 13 % — AB (ref 0.0–7.0)
Eosinophils Absolute: 0.7 10*3/uL — ABNORMAL HIGH (ref 0.0–0.5)
HCT: 35.5 % — ABNORMAL LOW (ref 38.4–49.9)
HEMOGLOBIN: 11.4 g/dL — AB (ref 13.0–17.1)
LYMPH%: 13.2 % — AB (ref 14.0–49.0)
MCH: 30.4 pg (ref 27.2–33.4)
MCHC: 32.1 g/dL (ref 32.0–36.0)
MCV: 94.7 fL (ref 79.3–98.0)
MONO#: 0.4 10*3/uL (ref 0.1–0.9)
MONO%: 7.2 % (ref 0.0–14.0)
NEUT%: 64 % (ref 39.0–75.0)
NEUTROS ABS: 3.4 10*3/uL (ref 1.5–6.5)
Platelets: 210 10*3/uL (ref 140–400)
RBC: 3.75 10*6/uL — AB (ref 4.20–5.82)
RDW: 17.4 % — AB (ref 11.0–14.6)
WBC: 5.3 10*3/uL (ref 4.0–10.3)
lymph#: 0.7 10*3/uL — ABNORMAL LOW (ref 0.9–3.3)

## 2015-10-11 LAB — COMPREHENSIVE METABOLIC PANEL
ALT: 17 U/L (ref 0–55)
AST: 20 U/L (ref 5–34)
Albumin: 3.2 g/dL — ABNORMAL LOW (ref 3.5–5.0)
Alkaline Phosphatase: 79 U/L (ref 40–150)
Anion Gap: 13 mEq/L — ABNORMAL HIGH (ref 3–11)
BILIRUBIN TOTAL: 0.43 mg/dL (ref 0.20–1.20)
BUN: 43.6 mg/dL — AB (ref 7.0–26.0)
CO2: 26 meq/L (ref 22–29)
Calcium: 9.2 mg/dL (ref 8.4–10.4)
Chloride: 95 mEq/L — ABNORMAL LOW (ref 98–109)
Creatinine: 6.7 mg/dL (ref 0.7–1.3)
EGFR: 9 mL/min/{1.73_m2} — AB (ref >90)
GLUCOSE: 145 mg/dL — AB (ref 70–140)
Potassium: 4.7 mEq/L (ref 3.5–5.1)
SODIUM: 134 meq/L — AB (ref 136–145)
TOTAL PROTEIN: 6.8 g/dL (ref 6.4–8.3)

## 2015-10-11 MED ORDER — SODIUM CHLORIDE 0.9 % IV SOLN
10.0000 mg | Freq: Once | INTRAVENOUS | Status: AC
  Administered 2015-10-11: 10 mg via INTRAVENOUS
  Filled 2015-10-11: qty 1

## 2015-10-11 MED ORDER — ACETAMINOPHEN 325 MG PO TABS
ORAL_TABLET | ORAL | Status: AC
  Filled 2015-10-11: qty 2

## 2015-10-11 MED ORDER — SODIUM CHLORIDE 0.9 % IV SOLN
375.0000 mg/m2 | Freq: Once | INTRAVENOUS | Status: AC
  Administered 2015-10-11: 800 mg via INTRAVENOUS
  Filled 2015-10-11: qty 50

## 2015-10-11 MED ORDER — PALONOSETRON HCL INJECTION 0.25 MG/5ML
0.2500 mg | Freq: Once | INTRAVENOUS | Status: AC
  Administered 2015-10-11: 0.25 mg via INTRAVENOUS

## 2015-10-11 MED ORDER — PALONOSETRON HCL INJECTION 0.25 MG/5ML
INTRAVENOUS | Status: AC
  Filled 2015-10-11: qty 5

## 2015-10-11 MED ORDER — HEPARIN SOD (PORK) LOCK FLUSH 100 UNIT/ML IV SOLN
500.0000 [IU] | Freq: Once | INTRAVENOUS | Status: AC | PRN
  Administered 2015-10-11: 500 [IU]
  Filled 2015-10-11: qty 5

## 2015-10-11 MED ORDER — SODIUM CHLORIDE 0.9% FLUSH
10.0000 mL | INTRAVENOUS | Status: DC | PRN
  Administered 2015-10-11: 10 mL
  Filled 2015-10-11: qty 10

## 2015-10-11 MED ORDER — SODIUM CHLORIDE 0.9 % IJ SOLN
10.0000 mL | INTRAMUSCULAR | Status: DC | PRN
  Administered 2015-10-11: 10 mL via INTRAVENOUS
  Filled 2015-10-11: qty 10

## 2015-10-11 MED ORDER — SODIUM CHLORIDE 0.9 % IV SOLN
Freq: Once | INTRAVENOUS | Status: AC
  Administered 2015-10-11: 10:00:00 via INTRAVENOUS

## 2015-10-11 MED ORDER — DOXORUBICIN HCL CHEMO IV INJECTION 2 MG/ML
25.0000 mg/m2 | Freq: Once | INTRAVENOUS | Status: AC
  Administered 2015-10-11: 52 mg via INTRAVENOUS
  Filled 2015-10-11: qty 26

## 2015-10-11 MED ORDER — DIPHENHYDRAMINE HCL 25 MG PO CAPS
ORAL_CAPSULE | ORAL | Status: AC
  Filled 2015-10-11: qty 2

## 2015-10-11 MED ORDER — ACETAMINOPHEN 325 MG PO TABS
650.0000 mg | ORAL_TABLET | Freq: Once | ORAL | Status: AC
  Administered 2015-10-11: 650 mg via ORAL

## 2015-10-11 MED ORDER — CYCLOPHOSPHAMIDE CHEMO INJECTION 1 GM
375.0000 mg/m2 | Freq: Once | INTRAMUSCULAR | Status: AC
  Administered 2015-10-11: 780 mg via INTRAVENOUS
  Filled 2015-10-11: qty 39

## 2015-10-11 MED ORDER — SODIUM CHLORIDE 0.9 % IV SOLN
1.0000 mg | Freq: Once | INTRAVENOUS | Status: AC
  Administered 2015-10-11: 1 mg via INTRAVENOUS
  Filled 2015-10-11: qty 1

## 2015-10-11 MED ORDER — DIPHENHYDRAMINE HCL 25 MG PO CAPS
50.0000 mg | ORAL_CAPSULE | Freq: Once | ORAL | Status: AC
  Administered 2015-10-11: 50 mg via ORAL

## 2015-10-11 NOTE — Assessment & Plan Note (Signed)
He tolerated treatment well without major side effects. PET/CT scan from 08/25/2015 show near complete response to treatment I will proceed with cycle 6 of treatment with similar dose adjustment. I plan to repeat PET CT scan next month to review response of treatment

## 2015-10-11 NOTE — Telephone Encounter (Signed)
Avs report and schedule given per 10/11/15 los. °

## 2015-10-11 NOTE — Assessment & Plan Note (Signed)
The patient is currently receiving hemodialysis. The cause of his renal failure is due to polycystic kidney disease. We will order dose adjustment as above

## 2015-10-11 NOTE — Assessment & Plan Note (Signed)
This is likely anemia of chronic disease. The patient denies recent history of bleeding such as epistaxis, hematuria or hematochezia. He is asymptomatic from the anemia. We will observe for now.  He does not require transfusion now.  I would defer ESA management to nephrologist.

## 2015-10-11 NOTE — Progress Notes (Signed)
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 [facility])   06/07/2015 Initial Diagnosis    Non-Hodgkin lymphoma of intra-abdominal lymph nodes ([place])      06/12/2015 Imaging    CT chest showed lymphadenopathy within the low neck and low chest, consistent with active lymphoma      06/14/2015 Procedure    He underwent CT-guided biopsy      06/14/2015 Pathology Results    Biopsy of the retroperitoneal mass confirmed diffuse large B-cell lymphoma      06/21/2015 Imaging    ECHO showed normal EF      06/21/2015 Procedure    He has port placement      06/23/2015 -  Chemotherapy    He received mini-RCHOP chemo      08/25/2015 PET scan    Significant interval decrease in size of the bulky retroperitoneal lymphadenopathy suggesting an excellent response to therapy. Residual matted soft tissue density in the retroperitoneum with SUV max of approximately 3.3       INTERVAL HISTORY: Please see below for problem oriented charting. He returns for follow-up. He tolerated chemotherapy well without side effects such as mucositis, nausea or vomiting. He continues to have persistent skin rash, a little worse due to larger affected areas. The skin rash appears to be responding to Benadryl and prednisone. His appointment to see dermatologist is pending He denies recent infection  REVIEW OF SYSTEMS:   Constitutional: Denies fevers, chills or abnormal weight loss Eyes: Denies blurriness of vision Ears, nose, mouth, throat, and face: Denies mucositis or sore throat Respiratory: Denies cough, dyspnea or wheezes Cardiovascular: Denies palpitation, chest discomfort or lower extremity swelling Gastrointestinal:   Denies nausea, heartburn or change in bowel habits Lymphatics: Denies new lymphadenopathy or easy bruising Neurological:Denies numbness, tingling or new weaknesses Behavioral/Psych: Mood is stable, no new changes  All other systems were reviewed with the patient and are negative.  I have reviewed the past medical history, past surgical history, social history and family history with the patient and they are unchanged from previous note.  ALLERGIES:  has No Known Allergies.  MEDICATIONS:  Current Outpatient Prescriptions  Medication Sig Dispense Refill  . aspirin 325 MG tablet Take 1 tablet (325 mg total) by mouth daily. 30 tablet 0  . calcium acetate (PHOSLO) 667 MG capsule Take 1 capsule by mouth 2 (two) times daily.   11  . cloNIDine (CATAPRES) 0.2 MG tablet Take 0.4 mg by mouth 2 (two) times daily.     Marland Kitchen docusate sodium (COLACE) 100 MG capsule Take 1 capsule (100 mg total) by mouth every 12 (twelve) hours. (Patient taking differently: Take 100 mg by mouth every 12 (twelve) hours. ) 60 capsule 0  . isosorbide-hydrALAZINE (BIDIL) 20-37.5 MG tablet Take 0.5 tablets by mouth 2 (two) times daily.    Marland Kitchen levETIRAcetam (KEPPRA) 500 MG tablet Take 1 tablet (500 mg total) by mouth 2 (two) times daily. 60 tablet 6  . levothyroxine (SYNTHROID, LEVOTHROID) 25 MCG tablet Take 25 mcg by mouth daily.   0  . lidocaine-prilocaine (EMLA) cream Apply to affected area once 30 g 3  . metoprolol (LOPRESSOR) 100 MG tablet Take 150 mg by mouth 2 (two) times daily.     Marland Kitchen  multivitamin (RENA-VIT) TABS tablet Take 1 tablet by mouth at bedtime. 90 tablet 0  . ondansetron (ZOFRAN ODT) 8 MG disintegrating tablet Take 1 tablet (8 mg total) by mouth every 8 (eight) hours as needed for nausea or vomiting. (Patient not taking: Reported on 08/04/2015) 12 tablet 0  . OVER THE COUNTER MEDICATION Take 1 capsule by mouth 2 (two) times daily. "Kidney Factor"    . oxyCODONE-acetaminophen (PERCOCET/ROXICET) 5-325 MG tablet Take 1  tablet by mouth every 4 (four) hours as needed for moderate pain. (Patient not taking: Reported on 08/04/2015) 90 tablet 0  . pentafluoroprop-tetrafluoroeth (GEBAUERS) AERO Apply 1 application topically as needed (topical anesthesia for hemodialysis). (Patient not taking: Reported on 09/18/2015) 30 mL 0  . predniSONE (DELTASONE) 20 MG tablet Take 3 tablets (60 mg total) by mouth daily. Take on days 2-5 of chemotherapy. 12 tablet 6  . prochlorperazine (COMPAZINE) 10 MG tablet Take 1 tablet (10 mg total) by mouth every 6 (six) hours as needed (Nausea or vomiting). (Patient not taking: Reported on 08/04/2015) 30 tablet 6   No current facility-administered medications for this visit.     PHYSICAL EXAMINATION: ECOG PERFORMANCE STATUS: 1 - Symptomatic but completely ambulatory  Vitals:   10/11/15 0815  BP: 110/77  Pulse: 62  Resp: 17  Temp: 98.2 F (36.8 C)   Filed Weights   10/11/15 0815  Weight: 187 lb 4.8 oz (85 kg)    GENERAL:alert, no distress and comfortable SKIN: Noted diffuse skin rash. With the patient's permission, pictures were taken EYES: normal, Conjunctiva are pink and non-injected, sclera clear OROPHARYNX:no exudate, no erythema and lips, buccal mucosa, and tongue normal  NECK: supple, thyroid normal size, non-tender, without nodularity LYMPH:  no palpable lymphadenopathy in the cervical, axillary or inguinal LUNGS: clear to auscultation and percussion with normal breathing effort HEART: regular rate & rhythm and no murmurs and no lower extremity edema ABDOMEN:abdomen soft, non-tender and normal bowel sounds Musculoskeletal:no cyanosis of digits and no clubbing  NEURO: alert & oriented x 3 with fluent speech, no focal motor/sensory deficits  LABORATORY DATA:  I have reviewed the data as listed    Component Value Date/Time   NA 134 (L) 10/11/2015 0753   K 4.7 10/11/2015 0753   CL 95 (L) 06/21/2015 1345   CO2 26 10/11/2015 0753   GLUCOSE 145 (H) 10/11/2015 0753   BUN  43.6 (H) 10/11/2015 0753   CREATININE 6.7 (HH) 10/11/2015 0753   CALCIUM 9.2 10/11/2015 0753   PROT 6.8 10/11/2015 0753   ALBUMIN 3.2 (L) 10/11/2015 0753   AST 20 10/11/2015 0753   ALT 17 10/11/2015 0753   ALKPHOS 79 10/11/2015 0753   BILITOT 0.43 10/11/2015 0753   GFRNONAA 8 (L) 06/21/2015 1345   GFRAA 9 (L) 06/21/2015 1345    No results found for: SPEP, UPEP  Lab Results  Component Value Date   WBC 5.3 10/11/2015   NEUTROABS 3.4 10/11/2015   HGB 11.4 (L) 10/11/2015   HCT 35.5 (L) 10/11/2015   MCV 94.7 10/11/2015   PLT 210 10/11/2015      Chemistry      Component Value Date/Time   NA 134 (L) 10/11/2015 0753   K 4.7 10/11/2015 0753   CL 95 (L) 06/21/2015 1345   CO2 26 10/11/2015 0753   BUN 43.6 (H) 10/11/2015 0753   CREATININE 6.7 (HH) 10/11/2015 0753      Component Value Date/Time   CALCIUM 9.2 10/11/2015 0753   ALKPHOS 79 10/11/2015 0753  AST 20 10/11/2015 0753   ALT 17 10/11/2015 0753   BILITOT 0.43 10/11/2015 0753      ASSESSMENT & PLAN:  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 PET/CT scan from 08/25/2015 show near complete response to treatment I will proceed with cycle 6 of treatment with similar dose adjustment. I plan to repeat PET CT scan next month to review response of treatment  Anemia in chronic kidney disease This is likely anemia of chronic disease. The patient denies recent history of bleeding such as epistaxis, hematuria or hematochezia. He is asymptomatic from the anemia. We will observe for now.  He does not require transfusion now.  I would defer ESA management to nephrologist.  End stage renal failure on dialysis [facility]) The patient is currently receiving hemodialysis. The cause of his renal failure is due to polycystic kidney disease. We will order dose adjustment as above  Dermatitis due to unknown cause He has evidence of dermatitis near the site of  his dialysis. He has been evaluated by nephrologist with dermatology appointment pending. Clinically, it does not look like cellulitis. We will proceed with treatment today and he will continue to take over-the-counter Benadryl as needed for symptoms According to patient's, the rash does respond to prednisone.       Orders Placed This Encounter  Procedures  . NM PET Image Restag (PS) Skull Base To Thigh    Standing Status:   Future    Standing Expiration Date:   11/14/2016    Order Specific Question:   Reason for exam:    Answer:   staging lymphoma, assess response to Rx    Order Specific Question:   Preferred imaging location?    Answer:   [facility]   All questions were answered. The patient knows to call the clinic with any problems, questions or concerns. No barriers to learning was detected. I spent 30 minutes counseling the patient face to face. The total time spent in the appointment was 40 minutes and more than 50% was on counseling and review of test results     [facility], [place], MD 10/11/2015 9:10 AM

## 2015-10-11 NOTE — Assessment & Plan Note (Signed)
He has evidence of dermatitis near the site of his dialysis. He has been evaluated by nephrologist with dermatology appointment pending. Clinically, it does not look like cellulitis. We will proceed with treatment today and he will continue to take over-the-counter Benadryl as needed for symptoms According to patient's, the rash does respond to prednisone.

## 2015-10-11 NOTE — Patient Instructions (Signed)
[facility] Discharge Instructions for Patients Receiving Chemotherapy  Today you received the following chemotherapy agents Adriamycin, Vincristine, Cytoxan, Rituxan To help prevent nausea and vomiting after your treatment, we encourage you to take your nausea medication as directed.   If you develop nausea and vomiting that is not controlled by your nausea medication, call the clinic.   BELOW ARE SYMPTOMS THAT SHOULD BE REPORTED IMMEDIATELY:  *FEVER GREATER THAN 100.5 F  *CHILLS WITH OR WITHOUT FEVER  NAUSEA AND VOMITING THAT IS NOT CONTROLLED WITH YOUR NAUSEA MEDICATION  *UNUSUAL SHORTNESS OF BREATH  *UNUSUAL BRUISING OR BLEEDING  TENDERNESS IN MOUTH AND THROAT WITH OR WITHOUT PRESENCE OF ULCERS  *URINARY PROBLEMS  *BOWEL PROBLEMS  UNUSUAL RASH Items with * indicate a potential emergency and should be followed up as soon as possible.  Feel free to call the clinic you have any questions or concerns. The clinic phone number is [phone].  Please show the [facility] at check-in to the Emergency Department and triage nurse.  Doxorubicin injection What is this medicine? DOXORUBICIN (dox oh ROO bi sin) is a chemotherapy drug. It is used to treat many kinds of cancer like Hodgkin's disease, leukemia, non-Hodgkin's lymphoma, neuroblastoma, sarcoma, and Wilms' tumor. It is also used to treat bladder cancer, breast cancer, lung cancer, ovarian cancer, stomach cancer, and thyroid cancer. This medicine may be used for other purposes; ask your health care provider or pharmacist if you have questions. What should I tell my health care provider before I take this medicine? They need to know if you have any of these conditions: -blood disorders -heart disease, recent heart attack -infection (especially a virus infection such as chickenpox, cold sores, or herpes) -irregular heartbeat -liver disease -recent or ongoing radiation therapy -an unusual or allergic  reaction to doxorubicin, other chemotherapy agents, other medicines, foods, dyes, or preservatives -pregnant or trying to get pregnant -breast-feeding How should I use this medicine? This drug is given as an infusion into a vein. It is administered in a hospital or clinic by a specially trained health care professional. If you have pain, swelling, burning or any unusual feeling around the site of your injection, tell your health care professional right away. Talk to your pediatrician regarding the use of this medicine in children. Special care may be needed. Overdosage: If you think you have taken too much of this medicine contact a poison control center or emergency room at once. NOTE: This medicine is only for you. Do not share this medicine with others. What if I miss a dose? It is important not to miss your dose. Call your doctor or health care professional if you are unable to keep an appointment. What may interact with this medicine? Do not take this medicine with any of the following medications: -cisapride -droperidol -halofantrine -pimozide -zidovudine This medicine may also interact with the following medications: -chloroquine -chlorpromazine -clarithromycin -cyclophosphamide -cyclosporine -erythromycin -medicines for depression, anxiety, or psychotic disturbances -medicines for irregular heart beat like amiodarone, bepridil, dofetilide, encainide, flecainide, propafenone, quinidine -medicines for seizures like ethotoin, fosphenytoin, phenytoin -medicines for nausea, vomiting like dolasetron, ondansetron, palonosetron -medicines to increase blood counts like filgrastim, pegfilgrastim, sargramostim -methadone -methotrexate -pentamidine -progesterone -vaccines -verapamil Talk to your doctor or health care professional before taking any of these medicines: -acetaminophen -aspirin -ibuprofen -ketoprofen -naproxen This list may not describe all possible interactions.  Give your health care provider a list of all the medicines, herbs, non-prescription drugs, or dietary supplements you use. Also tell  them if you smoke, drink alcohol, or use illegal drugs. Some items may interact with your medicine. What should I watch for while using this medicine? Your condition will be monitored carefully while you are receiving this medicine. You will need important blood work done while you are taking this medicine. This drug may make you feel generally unwell. This is not uncommon, as chemotherapy can affect healthy cells as well as cancer cells. Report any side effects. Continue your course of treatment even though you feel ill unless your doctor tells you to stop. Your urine may turn red for a few days after your dose. This is not blood. If your urine is dark or brown, call your doctor. In some cases, you may be given additional medicines to help with side effects. Follow all directions for their use. Call your doctor or health care professional for advice if you get a fever, chills or sore throat, or other symptoms of a cold or flu. Do not treat yourself. This drug decreases your body's ability to fight infections. Try to avoid being around people who are sick. This medicine may increase your risk to bruise or bleed. Call your doctor or health care professional if you notice any unusual bleeding. Be careful brushing and flossing your teeth or using a toothpick because you may get an infection or bleed more easily. If you have any dental work done, tell your dentist you are receiving this medicine. Avoid taking products that contain aspirin, acetaminophen, ibuprofen, naproxen, or ketoprofen unless instructed by your doctor. These medicines may hide a fever. Men and women of childbearing age should use effective birth control methods while using taking this medicine. Do not become pregnant while taking this medicine. There is a potential for serious side effects to an unborn child.  Talk to your health care professional or pharmacist for more information. Do not breast-feed an infant while taking this medicine. Do not let others touch your urine or other body fluids for 5 days after each treatment with this medicine. Caregivers should wear latex gloves to avoid touching body fluids during this time. There is a maximum amount of this medicine you should receive throughout your life. The amount depends on the medical condition being treated and your overall health. Your doctor will watch how much of this medicine you receive in your lifetime. Tell your doctor if you have taken this medicine before. What side effects may I notice from receiving this medicine? Side effects that you should report to your doctor or health care professional as soon as possible: -allergic reactions like skin rash, itching or hives, swelling of the face, lips, or tongue -low blood counts - this medicine may decrease the number of white blood cells, red blood cells and platelets. You may be at increased risk for infections and bleeding. -signs of infection - fever or chills, cough, sore throat, pain or difficulty passing urine -signs of decreased platelets or bleeding - bruising, pinpoint red spots on the skin, black, tarry stools, blood in the urine -signs of decreased red blood cells - unusually weak or tired, fainting spells, lightheadedness -breathing problems -chest pain -fast, irregular heartbeat -mouth sores -nausea, vomiting -pain, swelling, redness at site where injected -pain, tingling, numbness in the hands or feet -swelling of ankles, feet, or hands -unusual bleeding or bruising Side effects that usually do not require medical attention (report to your doctor or health care professional if they continue or are bothersome): -diarrhea -facial flushing -hair loss -loss of appetite -  missed menstrual periods -nail discoloration or damage -red or watery eyes -red colored urine -stomach  upset This list may not describe all possible side effects. Call your doctor for medical advice about side effects. You may report side effects to FDA at 1-800-FDA-1088. Where should I keep my medicine? This drug is given in a hospital or clinic and will not be stored at home. NOTE: This sheet is a summary. It may not cover all possible information. If you have questions about this medicine, talk to your doctor, pharmacist, or health care provider.    2016, Else[employer] Standard. (2012-05-12 09:54:34)  Vincristine injection What is this medicine? VINCRISTINE (vin KRIS teen) is a chemotherapy drug. It slows the growth of cancer cells. This medicine is used to treat many types of cancer like Hodgkin's disease, leukemia, non-Hodgkin's lymphoma, neuroblastoma (brain cancer), rhabdomyosarcoma, and Wilms' tumor. This medicine may be used for other purposes; ask your health care provider or pharmacist if you have questions. What should I tell my health care provider before I take this medicine? They need to know if you have any of these conditions: -blood disorders -gout -infection (especially chickenpox, cold sores, or herpes) -kidney disease -liver disease -lung disease -nervous system disease like Charcot-Marie-Tooth (CMT) -recent or ongoing radiation therapy -an unusual or allergic reaction to vincristine, other chemotherapy agents, other medicines, foods, dyes, or preservatives -pregnant or trying to get pregnant -breast-feeding How should I use this medicine? This drug is given as an infusion into a vein. It is administered in a hospital or clinic by a specially trained health care professional. If you have pain, swelling, burning, or any unusual feeling around the site of your injection, tell your health care professional right away. Talk to your pediatrician regarding the use of this medicine in children. While this drug may be prescribed for selected conditions, precautions do  apply. Overdosage: If you think you have taken too much of this medicine contact a poison control center or emergency room at once. NOTE: This medicine is only for you. Do not share this medicine with others. What if I miss a dose? It is important not to miss your dose. Call your doctor or health care professional if you are unable to keep an appointment. What may interact with this medicine? Do not take this medicine with any of the following medications: -itraconazole -mibefradil -voriconazole This medicine may also interact with the following medications: -cyclosporine -erythromycin -fluconazole -ketoconazole -medicines for HIV like delavirdine, efavirenz, nevirapine -medicines for seizures like ethotoin, fosphenotoin, phenytoin -medicines to increase blood counts like filgrastim, pegfilgrastim, sargramostim -other chemotherapy drugs like cisplatin, L-asparaginase, methotrexate, mitomycin, paclitaxel -pegaspargase -vaccines -zalcitabine, ddC Talk to your doctor or health care professional before taking any of these medicines: -acetaminophen -aspirin -ibuprofen -ketoprofen -naproxen This list may not describe all possible interactions. Give your health care provider a list of all the medicines, herbs, non-prescription drugs, or dietary supplements you use. Also tell them if you smoke, drink alcohol, or use illegal drugs. Some items may interact with your medicine. What should I watch for while using this medicine? Your condition will be monitored carefully while you are receiving this medicine. You will need important blood work done while you are taking this medicine. This drug may make you feel generally unwell. This is not uncommon, as chemotherapy can affect healthy cells as well as cancer cells. Report any side effects. Continue your course of treatment even though you feel ill unless your doctor tells you to stop. In some cases,  you may be given additional medicines to help  with side effects. Follow all directions for their use. Call your doctor or health care professional for advice if you get a fever, chills or sore throat, or other symptoms of a cold or flu. Do not treat yourself. Avoid taking products that contain aspirin, acetaminophen, ibuprofen, naproxen, or ketoprofen unless instructed by your doctor. These medicines may hide a fever. Do not become pregnant while taking this medicine. Women should inform their doctor if they wish to become pregnant or think they might be pregnant. There is a potential for serious side effects to an unborn child. Talk to your health care professional or pharmacist for more information. Do not breast-feed an infant while taking this medicine. Men may have a lower sperm count while taking this medicine. Talk to your doctor if you plan to father a child. What side effects may I notice from receiving this medicine? Side effects that you should report to your doctor or health care professional as soon as possible: -allergic reactions like skin rash, itching or hives, swelling of the face, lips, or tongue -breathing problems -confusion or changes in emotions or moods -constipation -cough -mouth sores -muscle weakness -nausea and vomiting -pain, swelling, redness or irritation at the injection site -pain, tingling, numbness in the hands or feet -problems with balance, talking, walking -seizures -stomach pain -trouble passing urine or change in the amount of urine Side effects that usually do not require medical attention (report to your doctor or health care professional if they continue or are bothersome): -diarrhea -hair loss -jaw pain -loss of appetite This list may not describe all possible side effects. Call your doctor for medical advice about side effects. You may report side effects to FDA at 1-800-FDA-1088. Where should I keep my medicine? This drug is given in a hospital or clinic and will not be stored at  home. NOTE: This sheet is a summary. It may not cover all possible information. If you have questions about this medicine, talk to your doctor, pharmacist, or health care provider.    2016, [employer]/Gold Standard. (2007-10-12 17:17:13) Cyclophosphamide injection What is this medicine? CYCLOPHOSPHAMIDE (sye kloe FOSS fa mide) is a chemotherapy drug. It slows the growth of cancer cells. This medicine is used to treat many types of cancer like lymphoma, myeloma, leukemia, breast cancer, and ovarian cancer, to name a few. This medicine may be used for other purposes; ask your health care provider or pharmacist if you have questions. What should I tell my health care provider before I take this medicine? They need to know if you have any of these conditions: -blood disorders -history of other chemotherapy -infection -kidney disease -liver disease -recent or ongoing radiation therapy -tumors in the bone marrow -an unusual or allergic reaction to cyclophosphamide, other chemotherapy, other medicines, foods, dyes, or preservatives -pregnant or trying to get pregnant -breast-feeding How should I use this medicine? This drug is usually given as an injection into a vein or muscle or by infusion into a vein. It is administered in a hospital or clinic by a specially trained health care professional. Talk to your pediatrician regarding the use of this medicine in children. Special care may be needed. Overdosage: If you think you have taken too much of this medicine contact a poison control center or emergency room at once. NOTE: This medicine is only for you. Do not share this medicine with others. What if I miss a dose? It is important not to miss your  dose. Call your doctor or health care professional if you are unable to keep an appointment. What may interact with this medicine? This medicine may interact with the following medications: -amiodarone -amphotericin B -azathioprine -certain  antiviral medicines for HIV or AIDS such as protease inhibitors (e.g., indinavir, ritonavir) and zidovudine -certain blood pressure medications such as benazepril, captopril, enalapril, fosinopril, lisinopril, moexipril, monopril, perindopril, quinapril, ramipril, trandolapril -certain cancer medications such as anthracyclines (e.g., daunorubicin, doxorubicin), busulfan, cytarabine, paclitaxel, pentostatin, tamoxifen, trastuzumab -certain diuretics such as chlorothiazide, chlorthalidone, hydrochlorothiazide, indapamide, metolazone -certain medicines that treat or prevent blood clots like warfarin -certain muscle relaxants such as succinylcholine -cyclosporine -etanercept -indomethacin -medicines to increase blood counts like filgrastim, pegfilgrastim, sargramostim -medicines used as general anesthesia -metronidazole -natalizumab This list may not describe all possible interactions. Give your health care provider a list of all the medicines, herbs, non-prescription drugs, or dietary supplements you use. Also tell them if you smoke, drink alcohol, or use illegal drugs. Some items may interact with your medicine. What should I watch for while using this medicine? Visit your doctor for checks on your progress. This drug may make you feel generally unwell. This is not uncommon, as chemotherapy can affect healthy cells as well as cancer cells. Report any side effects. Continue your course of treatment even though you feel ill unless your doctor tells you to stop. Drink water or other fluids as directed. Urinate often, even at night. In some cases, you may be given additional medicines to help with side effects. Follow all directions for their use. Call your doctor or health care professional for advice if you get a fever, chills or sore throat, or other symptoms of a cold or flu. Do not treat yourself. This drug decreases your body's ability to fight infections. Try to avoid being around people who are  sick. This medicine may increase your risk to bruise or bleed. Call your doctor or health care professional if you notice any unusual bleeding. Be careful brushing and flossing your teeth or using a toothpick because you may get an infection or bleed more easily. If you have any dental work done, tell your dentist you are receiving this medicine. You may get drowsy or dizzy. Do not drive, use machinery, or do anything that needs mental alertness until you know how this medicine affects you. Do not become pregnant while taking this medicine or for 1 year after stopping it. Women should inform their doctor if they wish to become pregnant or think they might be pregnant. Men should not father a child while taking this medicine and for 4 months after stopping it. There is a potential for serious side effects to an unborn child. Talk to your health care professional or pharmacist for more information. Do not breast-feed an infant while taking this medicine. This medicine may interfere with the ability to have a child. This medicine has caused ovarian failure in some women. This medicine has caused reduced sperm counts in some men. You should talk with your doctor or health care professional if you are concerned about your fertility. If you are going to have surgery, tell your doctor or health care professional that you have taken this medicine. What side effects may I notice from receiving this medicine? Side effects that you should report to your doctor or health care professional as soon as possible: -allergic reactions like skin rash, itching or hives, swelling of the face, lips, or tongue -low blood counts - this medicine may decrease the number  of white blood cells, red blood cells and platelets. You may be at increased risk for infections and bleeding. -signs of infection - fever or chills, cough, sore throat, pain or difficulty passing urine -signs of decreased platelets or bleeding - bruising,  pinpoint red spots on the skin, black, tarry stools, blood in the urine -signs of decreased red blood cells - unusually weak or tired, fainting spells, lightheadedness -breathing problems -dark urine -dizziness -palpitations -swelling of the ankles, feet, hands -trouble passing urine or change in the amount of urine -weight gain -yellowing of the eyes or skin Side effects that usually do not require medical attention (report to your doctor or health care professional if they continue or are bothersome): -changes in nail or skin color -hair loss -missed menstrual periods -mouth sores -nausea, vomiting This list may not describe all possible side effects. Call your doctor for medical advice about side effects. You may report side effects to FDA at 1-800-FDA-1088. Where should I keep my medicine? This drug is given in a hospital or clinic and will not be stored at home. NOTE: This sheet is a summary. It may not cover all possible information. If you have questions about this medicine, talk to your doctor, pharmacist, or health care provider.    2016, [employer] Standard. (2011-11-29 16:22:58)   Rituximab injection What is this medicine? RITUXIMAB (ri TUX i mab) is a monoclonal antibody. It is used commonly to treat non-Hodgkin lymphoma and other conditions. It is also used to treat rheumatoid arthritis (RA). In RA, this medicine slows the inflammatory process and help reduce joint pain and swelling. This medicine is often used with other cancer or arthritis medications. This medicine may be used for other purposes; ask your health care provider or pharmacist if you have questions. What should I tell my health care provider before I take this medicine? They need to know if you have any of these conditions: -blood disorders -heart disease -history of hepatitis B -infection (especially a virus infection such as chickenpox, cold sores, or herpes) -irregular heartbeat -kidney  disease -lung or breathing disease, like asthma -lupus -an unusual or allergic reaction to rituximab, mouse proteins, other medicines, foods, dyes, or preservatives -pregnant or trying to get pregnant -breast-feeding How should I use this medicine? This medicine is for infusion into a vein. It is administered in a hospital or clinic by a specially trained health care professional. A special MedGuide will be given to you by the pharmacist with each prescription and refill. Be sure to read this information carefully each time. Talk to your pediatrician regarding the use of this medicine in children. This medicine is not approved for use in children. Overdosage: If you think you have taken too much of this medicine contact a poison control center or emergency room at once. NOTE: This medicine is only for you. Do not share this medicine with others. What if I miss a dose? It is important not to miss a dose. Call your doctor or health care professional if you are unable to keep an appointment. What may interact with this medicine? -cisplatin -medicines for blood pressure -some other medicines for arthritis -vaccines This list may not describe all possible interactions. Give your health care provider a list of all the medicines, herbs, non-prescription drugs, or dietary supplements you use. Also tell them if you smoke, drink alcohol, or use illegal drugs. Some items may interact with your medicine. What should I watch for while using this medicine? Report any side effects  that you notice during your treatment right away, such as changes in your breathing, fever, chills, dizziness or lightheadedness. These effects are more common with the first dose. Visit your prescriber or health care professional for checks on your progress. You will need to have regular blood work. Report any other side effects. The side effects of this medicine can continue after you finish your treatment. Continue your course of  treatment even though you feel ill unless your doctor tells you to stop. Call your doctor or health care professional for advice if you get a fever, chills or sore throat, or other symptoms of a cold or flu. Do not treat yourself. This drug decreases your body's ability to fight infections. Try to avoid being around people who are sick. This medicine may increase your risk to bruise or bleed. Call your doctor or health care professional if you notice any unusual bleeding. Be careful brushing and flossing your teeth or using a toothpick because you may get an infection or bleed more easily. If you have any dental work done, tell your dentist you are receiving this medicine. Avoid taking products that contain aspirin, acetaminophen, ibuprofen, naproxen, or ketoprofen unless instructed by your doctor. These medicines may hide a fever. Do not become pregnant while taking this medicine. Women should inform their doctor if they wish to become pregnant or think they might be pregnant. There is a potential for serious side effects to an unborn child. Talk to your health care professional or pharmacist for more information. Do not breast-feed an infant while taking this medicine. What side effects may I notice from receiving this medicine? Side effects that you should report to your doctor or health care professional as soon as possible: -allergic reactions like skin rash, itching or hives, swelling of the face, lips, or tongue -low blood counts - this medicine may decrease the number of white blood cells, red blood cells and platelets. You may be at increased risk for infections and bleeding. -signs of infection - fever or chills, cough, sore throat, pain or difficulty passing urine -signs of decreased platelets or bleeding - bruising, pinpoint red spots on the skin, black, tarry stools, blood in the urine -signs of decreased red blood cells - unusually weak or tired, fainting spells, lightheadedness -breathing  problems -confused, not responsive -chest pain -fast, irregular heartbeat -feeling faint or lightheaded, falls -mouth sores -redness, blistering, peeling or loosening of the skin, including inside the mouth -stomach pain -swelling of the ankles, feet, or hands -trouble passing urine or change in the amount of urine Side effects that usually do not require medical attention (report to your doctor or other health care professional if they continue or are bothersome): -anxiety -headache -loss of appetite -muscle aches -nausea -night sweats This list may not describe all possible side effects. Call your doctor for medical advice about side effects. You may report side effects to FDA at 1-800-FDA-1088. Where should I keep my medicine? This drug is given in a hospital or clinic and will not be stored at home. NOTE: This sheet is a summary. It may not cover all possible information. If you have questions about this medicine, talk to your doctor, pharmacist, or health care provider.    2016, Elsevier/Gold Standard. (2014-03-23 22:30:56)

## 2015-10-12 DIAGNOSIS — E119 Type 2 diabetes mellitus without complications: Secondary | ICD-10-CM | POA: Diagnosis not present

## 2015-10-12 DIAGNOSIS — N2581 Secondary hyperparathyroidism of renal origin: Secondary | ICD-10-CM | POA: Diagnosis not present

## 2015-10-12 DIAGNOSIS — N186 End stage renal disease: Secondary | ICD-10-CM | POA: Diagnosis not present

## 2015-10-13 ENCOUNTER — Ambulatory Visit (HOSPITAL_BASED_OUTPATIENT_CLINIC_OR_DEPARTMENT_OTHER): Payer: Medicare Other

## 2015-10-13 VITALS — HR 57 | Temp 98.1°F | Resp 19

## 2015-10-13 DIAGNOSIS — Z5189 Encounter for other specified aftercare: Secondary | ICD-10-CM | POA: Diagnosis not present

## 2015-10-13 DIAGNOSIS — C8338 Diffuse large B-cell lymphoma, lymph nodes of multiple sites: Secondary | ICD-10-CM | POA: Diagnosis present

## 2015-10-13 DIAGNOSIS — L309 Dermatitis, unspecified: Secondary | ICD-10-CM | POA: Diagnosis not present

## 2015-10-13 MED ORDER — PEGFILGRASTIM INJECTION 6 MG/0.6ML ~~LOC~~
6.0000 mg | PREFILLED_SYRINGE | Freq: Once | SUBCUTANEOUS | Status: AC
  Administered 2015-10-13: 6 mg via SUBCUTANEOUS
  Filled 2015-10-13: qty 0.6

## 2015-10-13 NOTE — Patient Instructions (Signed)
Pegfilgrastim injection What is this medicine? PEGFILGRASTIM (PEG fil gra stim) is a long-acting granulocyte colony-stimulating factor that stimulates the growth of neutrophils, a type of white blood cell important in the body's fight against infection. It is used to reduce the incidence of fever and infection in patients with certain types of cancer who are receiving chemotherapy that affects the bone marrow, and to increase survival after being exposed to high doses of radiation. This medicine may be used for other purposes; ask your health care provider or pharmacist if you have questions. What should I tell my health care provider before I take this medicine? They need to know if you have any of these conditions: -kidney disease -latex allergy -ongoing radiation therapy -sickle cell disease -skin reactions to acrylic adhesives (On-Body Injector only) -an unusual or allergic reaction to pegfilgrastim, filgrastim, other medicines, foods, dyes, or preservatives -pregnant or trying to get pregnant -breast-feeding How should I use this medicine? This medicine is for injection under the skin. If you get this medicine at home, you will be taught how to prepare and give the pre-filled syringe or how to use the On-body Injector. Refer to the patient Instructions for Use for detailed instructions. Use exactly as directed. Take your medicine at regular intervals. Do not take your medicine more often than directed. It is important that you put your used needles and syringes in a special sharps container. Do not put them in a trash can. If you do not have a sharps container, call your pharmacist or healthcare provider to get one. Talk to your pediatrician regarding the use of this medicine in children. While this drug may be prescribed for selected conditions, precautions do apply. Overdosage: If you think you have taken too much of this medicine contact a poison control center or emergency room at  once. NOTE: This medicine is only for you. Do not share this medicine with others. What if I miss a dose? It is important not to miss your dose. Call your doctor or health care professional if you miss your dose. If you miss a dose due to an On-body Injector failure or leakage, a new dose should be administered as soon as possible using a single prefilled syringe for manual use. What may interact with this medicine? Interactions have not been studied. Give your health care provider a list of all the medicines, herbs, non-prescription drugs, or dietary supplements you use. Also tell them if you smoke, drink alcohol, or use illegal drugs. Some items may interact with your medicine. This list may not describe all possible interactions. Give your health care provider a list of all the medicines, herbs, non-prescription drugs, or dietary supplements you use. Also tell them if you smoke, drink alcohol, or use illegal drugs. Some items may interact with your medicine. What should I watch for while using this medicine? You may need blood work done while you are taking this medicine. If you are going to need a MRI, CT scan, or other procedure, tell your doctor that you are using this medicine (On-Body Injector only). What side effects may I notice from receiving this medicine? Side effects that you should report to your doctor or health care professional as soon as possible: -allergic reactions like skin rash, itching or hives, swelling of the face, lips, or tongue -dizziness -fever -pain, redness, or irritation at site where injected -pinpoint red spots on the skin -red or dark-brown urine -shortness of breath or breathing problems -stomach or side pain, or pain   at the shoulder -swelling -tiredness -trouble passing urine or change in the amount of urine Side effects that usually do not require medical attention (report to your doctor or health care professional if they continue or are  bothersome): -bone pain -muscle pain This list may not describe all possible side effects. Call your doctor for medical advice about side effects. You may report side effects to FDA at 1-800-FDA-1088. Where should I keep my medicine? Keep out of the reach of children. Store pre-filled syringes in a refrigerator between 2 and 8 degrees C (36 and 46 degrees F). Do not freeze. Keep in carton to protect from light. Throw away this medicine if it is left out of the refrigerator for more than 48 hours. Throw away any unused medicine after the expiration date. NOTE: This sheet is a summary. It may not cover all possible information. If you have questions about this medicine, talk to your doctor, pharmacist, or health care provider.    2016, [employer]vier/[employer] Standard. (2014-02-03 14:30:14)  

## 2015-10-14 DIAGNOSIS — N2581 Secondary hyperparathyroidism of renal origin: Secondary | ICD-10-CM | POA: Diagnosis not present

## 2015-10-14 DIAGNOSIS — N186 End stage renal disease: Secondary | ICD-10-CM | POA: Diagnosis not present

## 2015-10-14 DIAGNOSIS — E119 Type 2 diabetes mellitus without complications: Secondary | ICD-10-CM | POA: Diagnosis not present

## 2015-10-17 DIAGNOSIS — E119 Type 2 diabetes mellitus without complications: Secondary | ICD-10-CM | POA: Diagnosis not present

## 2015-10-17 DIAGNOSIS — N186 End stage renal disease: Secondary | ICD-10-CM | POA: Diagnosis not present

## 2015-10-17 DIAGNOSIS — N2581 Secondary hyperparathyroidism of renal origin: Secondary | ICD-10-CM | POA: Diagnosis not present

## 2015-10-19 DIAGNOSIS — N186 End stage renal disease: Secondary | ICD-10-CM | POA: Diagnosis not present

## 2015-10-19 DIAGNOSIS — E119 Type 2 diabetes mellitus without complications: Secondary | ICD-10-CM | POA: Diagnosis not present

## 2015-10-19 DIAGNOSIS — N2581 Secondary hyperparathyroidism of renal origin: Secondary | ICD-10-CM | POA: Diagnosis not present

## 2015-10-21 DIAGNOSIS — E119 Type 2 diabetes mellitus without complications: Secondary | ICD-10-CM | POA: Diagnosis not present

## 2015-10-21 DIAGNOSIS — N2581 Secondary hyperparathyroidism of renal origin: Secondary | ICD-10-CM | POA: Diagnosis not present

## 2015-10-21 DIAGNOSIS — N186 End stage renal disease: Secondary | ICD-10-CM | POA: Diagnosis not present

## 2015-10-24 DIAGNOSIS — N2581 Secondary hyperparathyroidism of renal origin: Secondary | ICD-10-CM | POA: Diagnosis not present

## 2015-10-24 DIAGNOSIS — E119 Type 2 diabetes mellitus without complications: Secondary | ICD-10-CM | POA: Diagnosis not present

## 2015-10-24 DIAGNOSIS — N186 End stage renal disease: Secondary | ICD-10-CM | POA: Diagnosis not present

## 2015-10-25 DIAGNOSIS — M216X1 Other acquired deformities of right foot: Secondary | ICD-10-CM | POA: Diagnosis not present

## 2015-10-25 DIAGNOSIS — E119 Type 2 diabetes mellitus without complications: Secondary | ICD-10-CM | POA: Diagnosis not present

## 2015-10-25 DIAGNOSIS — L84 Corns and callosities: Secondary | ICD-10-CM | POA: Diagnosis not present

## 2015-10-25 DIAGNOSIS — M216X2 Other acquired deformities of left foot: Secondary | ICD-10-CM | POA: Diagnosis not present

## 2015-10-25 DIAGNOSIS — N186 End stage renal disease: Secondary | ICD-10-CM | POA: Diagnosis not present

## 2015-10-25 DIAGNOSIS — L602 Onychogryphosis: Secondary | ICD-10-CM | POA: Diagnosis not present

## 2015-10-26 DIAGNOSIS — N186 End stage renal disease: Secondary | ICD-10-CM | POA: Diagnosis not present

## 2015-10-26 DIAGNOSIS — E119 Type 2 diabetes mellitus without complications: Secondary | ICD-10-CM | POA: Diagnosis not present

## 2015-10-26 DIAGNOSIS — N2581 Secondary hyperparathyroidism of renal origin: Secondary | ICD-10-CM | POA: Diagnosis not present

## 2015-10-28 DIAGNOSIS — Z992 Dependence on renal dialysis: Secondary | ICD-10-CM | POA: Diagnosis not present

## 2015-10-28 DIAGNOSIS — E119 Type 2 diabetes mellitus without complications: Secondary | ICD-10-CM | POA: Diagnosis not present

## 2015-10-28 DIAGNOSIS — I129 Hypertensive chronic kidney disease with stage 1 through stage 4 chronic kidney disease, or unspecified chronic kidney disease: Secondary | ICD-10-CM | POA: Diagnosis not present

## 2015-10-28 DIAGNOSIS — N2581 Secondary hyperparathyroidism of renal origin: Secondary | ICD-10-CM | POA: Diagnosis not present

## 2015-10-28 DIAGNOSIS — N186 End stage renal disease: Secondary | ICD-10-CM | POA: Diagnosis not present

## 2015-10-31 DIAGNOSIS — N186 End stage renal disease: Secondary | ICD-10-CM | POA: Diagnosis not present

## 2015-10-31 DIAGNOSIS — D631 Anemia in chronic kidney disease: Secondary | ICD-10-CM | POA: Diagnosis not present

## 2015-10-31 DIAGNOSIS — N2581 Secondary hyperparathyroidism of renal origin: Secondary | ICD-10-CM | POA: Diagnosis not present

## 2015-11-02 DIAGNOSIS — D631 Anemia in chronic kidney disease: Secondary | ICD-10-CM | POA: Diagnosis not present

## 2015-11-02 DIAGNOSIS — N2581 Secondary hyperparathyroidism of renal origin: Secondary | ICD-10-CM | POA: Diagnosis not present

## 2015-11-02 DIAGNOSIS — N186 End stage renal disease: Secondary | ICD-10-CM | POA: Diagnosis not present

## 2015-11-04 DIAGNOSIS — N186 End stage renal disease: Secondary | ICD-10-CM | POA: Diagnosis not present

## 2015-11-04 DIAGNOSIS — D631 Anemia in chronic kidney disease: Secondary | ICD-10-CM | POA: Diagnosis not present

## 2015-11-04 DIAGNOSIS — N2581 Secondary hyperparathyroidism of renal origin: Secondary | ICD-10-CM | POA: Diagnosis not present

## 2015-11-07 DIAGNOSIS — D631 Anemia in chronic kidney disease: Secondary | ICD-10-CM | POA: Diagnosis not present

## 2015-11-07 DIAGNOSIS — N186 End stage renal disease: Secondary | ICD-10-CM | POA: Diagnosis not present

## 2015-11-07 DIAGNOSIS — N2581 Secondary hyperparathyroidism of renal origin: Secondary | ICD-10-CM | POA: Diagnosis not present

## 2015-11-09 DIAGNOSIS — D631 Anemia in chronic kidney disease: Secondary | ICD-10-CM | POA: Diagnosis not present

## 2015-11-09 DIAGNOSIS — N186 End stage renal disease: Secondary | ICD-10-CM | POA: Diagnosis not present

## 2015-11-09 DIAGNOSIS — N2581 Secondary hyperparathyroidism of renal origin: Secondary | ICD-10-CM | POA: Diagnosis not present

## 2015-11-11 DIAGNOSIS — N186 End stage renal disease: Secondary | ICD-10-CM | POA: Diagnosis not present

## 2015-11-11 DIAGNOSIS — D631 Anemia in chronic kidney disease: Secondary | ICD-10-CM | POA: Diagnosis not present

## 2015-11-11 DIAGNOSIS — N2581 Secondary hyperparathyroidism of renal origin: Secondary | ICD-10-CM | POA: Diagnosis not present

## 2015-11-14 DIAGNOSIS — D631 Anemia in chronic kidney disease: Secondary | ICD-10-CM | POA: Diagnosis not present

## 2015-11-14 DIAGNOSIS — N186 End stage renal disease: Secondary | ICD-10-CM | POA: Diagnosis not present

## 2015-11-14 DIAGNOSIS — N2581 Secondary hyperparathyroidism of renal origin: Secondary | ICD-10-CM | POA: Diagnosis not present

## 2015-11-16 DIAGNOSIS — D631 Anemia in chronic kidney disease: Secondary | ICD-10-CM | POA: Diagnosis not present

## 2015-11-16 DIAGNOSIS — N186 End stage renal disease: Secondary | ICD-10-CM | POA: Diagnosis not present

## 2015-11-16 DIAGNOSIS — N2581 Secondary hyperparathyroidism of renal origin: Secondary | ICD-10-CM | POA: Diagnosis not present

## 2015-11-18 DIAGNOSIS — N2581 Secondary hyperparathyroidism of renal origin: Secondary | ICD-10-CM | POA: Diagnosis not present

## 2015-11-18 DIAGNOSIS — D631 Anemia in chronic kidney disease: Secondary | ICD-10-CM | POA: Diagnosis not present

## 2015-11-18 DIAGNOSIS — N186 End stage renal disease: Secondary | ICD-10-CM | POA: Diagnosis not present

## 2015-11-21 DIAGNOSIS — N186 End stage renal disease: Secondary | ICD-10-CM | POA: Diagnosis not present

## 2015-11-21 DIAGNOSIS — N2581 Secondary hyperparathyroidism of renal origin: Secondary | ICD-10-CM | POA: Diagnosis not present

## 2015-11-21 DIAGNOSIS — D631 Anemia in chronic kidney disease: Secondary | ICD-10-CM | POA: Diagnosis not present

## 2015-11-23 DIAGNOSIS — N186 End stage renal disease: Secondary | ICD-10-CM | POA: Diagnosis not present

## 2015-11-23 DIAGNOSIS — N2581 Secondary hyperparathyroidism of renal origin: Secondary | ICD-10-CM | POA: Diagnosis not present

## 2015-11-23 DIAGNOSIS — E119 Type 2 diabetes mellitus without complications: Secondary | ICD-10-CM | POA: Diagnosis not present

## 2015-11-23 DIAGNOSIS — D631 Anemia in chronic kidney disease: Secondary | ICD-10-CM | POA: Diagnosis not present

## 2015-11-25 DIAGNOSIS — D631 Anemia in chronic kidney disease: Secondary | ICD-10-CM | POA: Diagnosis not present

## 2015-11-25 DIAGNOSIS — N186 End stage renal disease: Secondary | ICD-10-CM | POA: Diagnosis not present

## 2015-11-25 DIAGNOSIS — N2581 Secondary hyperparathyroidism of renal origin: Secondary | ICD-10-CM | POA: Diagnosis not present

## 2015-11-27 ENCOUNTER — Encounter (HOSPITAL_COMMUNITY)
Admission: RE | Admit: 2015-11-27 | Discharge: 2015-11-27 | Disposition: A | Discharge status: Home / Self Care | Payer: Medicare Other | Source: Ambulatory Visit | Attending: Hematology and Oncology | Admitting: Hematology and Oncology

## 2015-11-27 ENCOUNTER — Ambulatory Visit (HOSPITAL_BASED_OUTPATIENT_CLINIC_OR_DEPARTMENT_OTHER): Payer: Medicare Other | Admitting: Nurse Practitioner

## 2015-11-27 VITALS — BP 123/79 | HR 57 | Temp 97.5°F | Resp 20

## 2015-11-27 DIAGNOSIS — C8338 Diffuse large B-cell lymphoma, lymph nodes of multiple sites: Secondary | ICD-10-CM

## 2015-11-27 DIAGNOSIS — C8590 Non-Hodgkin lymphoma, unspecified, unspecified site: Secondary | ICD-10-CM | POA: Diagnosis not present

## 2015-11-27 DIAGNOSIS — Z452 Encounter for adjustment and management of vascular access device: Secondary | ICD-10-CM | POA: Diagnosis present

## 2015-11-27 DIAGNOSIS — Z95828 Presence of other vascular implants and grafts: Secondary | ICD-10-CM

## 2015-11-27 LAB — GLUCOSE, CAPILLARY: Glucose-Capillary: 101 mg/dL — ABNORMAL HIGH (ref 65–99)

## 2015-11-27 MED ORDER — FLUDEOXYGLUCOSE F - 18 (FDG) INJECTION
9.3300 | Freq: Once | INTRAVENOUS | Status: AC | PRN
  Administered 2015-11-27: 9.33 via INTRAVENOUS

## 2015-11-27 MED ORDER — SODIUM CHLORIDE 0.9 % IJ SOLN
10.0000 mL | INTRAMUSCULAR | Status: DC | PRN
  Administered 2015-11-27: 10 mL via INTRAVENOUS
  Filled 2015-11-27: qty 10

## 2015-11-28 ENCOUNTER — Encounter: Payer: Self-pay | Admitting: Hematology and Oncology

## 2015-11-28 ENCOUNTER — Ambulatory Visit (HOSPITAL_BASED_OUTPATIENT_CLINIC_OR_DEPARTMENT_OTHER): Payer: Medicare Other | Admitting: Hematology and Oncology

## 2015-11-28 ENCOUNTER — Telehealth: Payer: Self-pay | Admitting: Hematology and Oncology

## 2015-11-28 VITALS — BP 95/67 | HR 72 | Temp 97.4°F | Resp 18 | Ht 71.0 in | Wt 184.1 lb

## 2015-11-28 DIAGNOSIS — S065XAA Traumatic subdural hemorrhage with loss of consciousness status unknown, initial encounter: Secondary | ICD-10-CM

## 2015-11-28 DIAGNOSIS — Z992 Dependence on renal dialysis: Secondary | ICD-10-CM | POA: Diagnosis not present

## 2015-11-28 DIAGNOSIS — Z95828 Presence of other vascular implants and grafts: Secondary | ICD-10-CM

## 2015-11-28 DIAGNOSIS — R1900 Intra-abdominal and pelvic swelling, mass and lump, unspecified site: Secondary | ICD-10-CM

## 2015-11-28 DIAGNOSIS — D631 Anemia in chronic kidney disease: Secondary | ICD-10-CM | POA: Diagnosis not present

## 2015-11-28 DIAGNOSIS — K1231 Oral mucositis (ulcerative) due to antineoplastic therapy: Secondary | ICD-10-CM

## 2015-11-28 DIAGNOSIS — N186 End stage renal disease: Secondary | ICD-10-CM

## 2015-11-28 DIAGNOSIS — C8338 Diffuse large B-cell lymphoma, lymph nodes of multiple sites: Secondary | ICD-10-CM | POA: Diagnosis not present

## 2015-11-28 DIAGNOSIS — Z2989 Encounter for other specified prophylactic measures: Secondary | ICD-10-CM

## 2015-11-28 DIAGNOSIS — Q612 Polycystic kidney, adult type: Secondary | ICD-10-CM | POA: Diagnosis not present

## 2015-11-28 DIAGNOSIS — Z23 Encounter for immunization: Secondary | ICD-10-CM | POA: Diagnosis not present

## 2015-11-28 DIAGNOSIS — Z298 Encounter for other specified prophylactic measures: Secondary | ICD-10-CM

## 2015-11-28 DIAGNOSIS — D649 Anemia, unspecified: Secondary | ICD-10-CM | POA: Diagnosis not present

## 2015-11-28 DIAGNOSIS — N2581 Secondary hyperparathyroidism of renal origin: Secondary | ICD-10-CM | POA: Diagnosis not present

## 2015-11-28 DIAGNOSIS — I129 Hypertensive chronic kidney disease with stage 1 through stage 4 chronic kidney disease, or unspecified chronic kidney disease: Secondary | ICD-10-CM | POA: Diagnosis not present

## 2015-11-28 DIAGNOSIS — S065X9A Traumatic subdural hemorrhage with loss of consciousness of unspecified duration, initial encounter: Secondary | ICD-10-CM

## 2015-11-28 MED ORDER — INFLUENZA VAC SPLIT QUAD 0.5 ML IM SUSY
0.5000 mL | PREFILLED_SYRINGE | Freq: Once | INTRAMUSCULAR | Status: AC
  Administered 2015-11-28: 0.5 mL via INTRAMUSCULAR
  Filled 2015-11-28: qty 0.5

## 2015-11-28 NOTE — Telephone Encounter (Signed)
Appointments scheduled per 10/31 LOS. Patient given AVS report and calendar of future scheduled appointments. °

## 2015-11-28 NOTE — Assessment & Plan Note (Signed)
The patient would like to keep his port for now. I will schedule port flush appointments every 6-8 weeks

## 2015-11-28 NOTE — Assessment & Plan Note (Signed)
The patient is currently receiving hemodialysis. The cause of his renal failure is due to polycystic kidney disease. I will defer to his nephrologist for management

## 2015-11-28 NOTE — Progress Notes (Signed)
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 [facility])   06/07/2015 Initial Diagnosis    Non-Hodgkin lymphoma of intra-abdominal lymph nodes ([place])      06/12/2015 Imaging    CT chest showed lymphadenopathy within the low neck and low chest, consistent with active lymphoma      06/14/2015 Procedure    He underwent CT-guided biopsy      06/14/2015 Pathology Results    Biopsy of the retroperitoneal mass confirmed diffuse large B-cell lymphoma      06/21/2015 Imaging    ECHO showed normal EF      06/21/2015 Procedure    He has port placement      06/23/2015 - 10/12/2015 Chemotherapy    He received mini-RCHOP chemo x 6 cycles      08/25/2015 PET scan    Significant interval decrease in size of the bulky retroperitoneal lymphadenopathy suggesting an excellent response to therapy. Residual matted soft tissue density in the retroperitoneum with SUV max of approximately 3.3      11/27/2015 PET scan    Continued improved appearance of the treated lymphoma. Minimal residual matted soft tissue density but no discrete measurable disease and no hypermetabolism to suggest metabolically active tumor.       INTERVAL HISTORY: Please see below for problem oriented charting. He returns for further follow-up. He feels well. He has fully recovered from recent treatment.  REVIEW OF SYSTEMS:   Constitutional: Denies fevers, chills or abnormal weight loss Eyes: Denies blurriness of vision Ears, nose, mouth, throat, and face: Denies mucositis or sore throat Respiratory: Denies cough, dyspnea or wheezes Cardiovascular: Denies palpitation, chest discomfort or lower extremity swelling Gastrointestinal:   Denies nausea, heartburn or change in bowel habits Skin: Denies abnormal skin rashes Lymphatics: Denies new lymphadenopathy or easy bruising Neurological:Denies numbness, tingling or new weaknesses Behavioral/Psych: Mood is stable, no new changes  All other systems were reviewed with the patient and are negative.  I have reviewed the past medical history, past surgical history, social history and family history with the patient and they are unchanged from previous note.  ALLERGIES:  has No Known Allergies.  MEDICATIONS:  Current Outpatient Prescriptions  Medication Sig Dispense Refill  . aspirin 325 MG tablet Take 1 tablet (325 mg total) by mouth daily. 30 tablet 0  . calcium acetate (PHOSLO) 667 MG capsule Take 1 capsule by mouth 2 (two) times daily.   11  . cloNIDine (CATAPRES) 0.2 MG tablet Take 0.4 mg by mouth 2 (two) times daily.     Marland Kitchen docusate sodium (COLACE) 100 MG capsule Take 1 capsule (100 mg total) by mouth every 12 (twelve) hours. (Patient taking differently: Take 100 mg by mouth every 12 (twelve) hours. ) 60 capsule 0  . isosorbide-hydrALAZINE (BIDIL) 20-37.5 MG tablet Take 0.5 tablets by mouth 2 (two) times daily.    Marland Kitchen levETIRAcetam (KEPPRA) 500 MG tablet Take 1 tablet (500 mg total) by mouth 2 (two) times daily. 60 tablet 6  . levothyroxine (SYNTHROID, LEVOTHROID) 25 MCG tablet Take 25 mcg by mouth daily.   0  . lidocaine-prilocaine (EMLA) cream Apply to affected area once 30 g 3  . metoprolol (LOPRESSOR) 100 MG tablet Take 150 mg by mouth 2 (two) times  daily.     . multivitamin (RENA-VIT) TABS tablet Take 1 tablet by mouth at bedtime. 90 tablet 0  . ondansetron (ZOFRAN ODT) 8 MG disintegrating tablet Take 1 tablet (8 mg total) by mouth every 8 (eight) hours as needed for nausea or vomiting. (Patient not taking: Reported on 08/04/2015) 12 tablet 0  . OVER THE COUNTER MEDICATION Take 1 capsule by mouth 2 (two) times daily. "Kidney Factor"    . oxyCODONE-acetaminophen  (PERCOCET/ROXICET) 5-325 MG tablet Take 1 tablet by mouth every 4 (four) hours as needed for moderate pain. (Patient not taking: Reported on 08/04/2015) 90 tablet 0  . pentafluoroprop-tetrafluoroeth (GEBAUERS) AERO Apply 1 application topically as needed (topical anesthesia for hemodialysis). (Patient not taking: Reported on 09/18/2015) 30 mL 0  . predniSONE (DELTASONE) 20 MG tablet Take 3 tablets (60 mg total) by mouth daily. Take on days 2-5 of chemotherapy. 12 tablet 6  . prochlorperazine (COMPAZINE) 10 MG tablet Take 1 tablet (10 mg total) by mouth every 6 (six) hours as needed (Nausea or vomiting). (Patient not taking: Reported on 08/04/2015) 30 tablet 6   No current facility-administered medications for this visit.     PHYSICAL EXAMINATION: ECOG PERFORMANCE STATUS: 0 - Asymptomatic  Vitals:   11/28/15 1258  BP: 95/67  Pulse: 72  Resp: 18  Temp: 97.4 F (36.3 C)   Filed Weights   11/28/15 1258  Weight: 184 lb 1.6 oz (83.5 kg)    GENERAL:alert, no distress and comfortable SKIN: skin color, texture, turgor are normal, no rashes or significant lesions EYES: normal, Conjunctiva are pink and non-injected, sclera clear Musculoskeletal:no cyanosis of digits and no clubbing  NEURO: alert & oriented x 3 with fluent speech, no focal motor/sensory deficits  LABORATORY DATA:  I have reviewed the data as listed    Component Value Date/Time   NA 134 (L) 10/11/2015 0753   K 4.7 10/11/2015 0753   CL 95 (L) 06/21/2015 1345   CO2 26 10/11/2015 0753   GLUCOSE 145 (H) 10/11/2015 0753   BUN 43.6 (H) 10/11/2015 0753   CREATININE 6.7 (HH) 10/11/2015 0753   CALCIUM 9.2 10/11/2015 0753   PROT 6.8 10/11/2015 0753   ALBUMIN 3.2 (L) 10/11/2015 0753   AST 20 10/11/2015 0753   ALT 17 10/11/2015 0753   ALKPHOS 79 10/11/2015 0753   BILITOT 0.43 10/11/2015 0753   GFRNONAA 8 (L) 06/21/2015 1345   GFRAA 9 (L) 06/21/2015 1345    No results found for: SPEP, UPEP  Lab Results  Component Value Date    WBC 5.3 10/11/2015   NEUTROABS 3.4 10/11/2015   HGB 11.4 (L) 10/11/2015   HCT 35.5 (L) 10/11/2015   MCV 94.7 10/11/2015   PLT 210 10/11/2015      Chemistry      Component Value Date/Time   NA 134 (L) 10/11/2015 0753   K 4.7 10/11/2015 0753   CL 95 (L) 06/21/2015 1345   CO2 26 10/11/2015 0753   BUN 43.6 (H) 10/11/2015 0753   CREATININE 6.7 (HH) 10/11/2015 0753      Component Value Date/Time   CALCIUM 9.2 10/11/2015 0753   ALKPHOS 79 10/11/2015 0753   AST 20 10/11/2015 0753   ALT 17 10/11/2015 0753   BILITOT 0.43 10/11/2015 0753       RADIOGRAPHIC STUDIES: I have personally reviewed the radiological images as listed and agreed with the findings in the report. Nm Pet Image Restag (ps) Skull Base To Thigh  Result Date: 11/27/2015 CLINICAL DATA:  Subsequent  treatment strategy for lymphoma. EXAM: NUCLEAR MEDICINE PET SKULL BASE TO THIGH TECHNIQUE: 9.3 mCi F-18 FDG was injected intravenously. Full-ring PET imaging was performed from the skull base to thigh after the radiotracer. CT data was obtained and used for attenuation correction and anatomic localization. FASTING BLOOD GLUCOSE:  Value: 101 mg/dl COMPARISON:  PET-CT 08/25/2015 FINDINGS: NECK No hypermetabolic lymph nodes in the neck. CHEST No hypermetabolic mediastinal or hilar nodes. No suspicious pulmonary nodules on the CT scan. ABDOMEN/PELVIS Matted soft tissue density noted in the retroperitoneum consistent with treated lymphoma. No measurable disease. No areas of abnormal FDG uptake to suggest recurrent metabolically active lymphoma. Stable changes of multicystic dysplastic kidneys. SKELETON No focal hypermetabolic activity to suggest skeletal metastasis. IMPRESSION: Continued improved appearance of the treated lymphoma. Minimal residual matted soft tissue density but no discrete measurable disease and no hypermetabolism to suggest metabolically active tumor. Electronically Signed   By: Marijo Sanes M.D.   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 We reviewed the current guidelines. I recommend observation moving forward with repeat blood work, history and examination in 3 months. I do not recommend adjuvant radiation therapy I will get his case presented at next week's hematology tumor board for further discussion  End stage renal failure on dialysis [facility]) The patient is currently receiving hemodialysis. The cause of his renal failure is due to polycystic kidney disease. I will defer to his nephrologist for management  Anemia in chronic kidney disease This is likely anemia of chronic disease. The patient denies recent history of bleeding such as epistaxis, hematuria or hematochezia. He is asymptomatic from the anemia. We will observe for now.  He does not require transfusion now.  I would defer ESA management to nephrologist.  Port catheter in place The patient would like to keep his port for now. I will schedule port flush appointments every 6-8 weeks   No orders of the defined types were placed in this encounter.  All questions were answered. The patient knows to call the clinic with any problems, questions or concerns. No barriers to learning was detected. I spent 15 minutes counseling the patient face to face. The total time spent in the appointment was 20 minutes and more than 50% was on counseling and review of test results     Heath Lark, MD 11/28/2015 2:29 PM

## 2015-11-28 NOTE — Assessment & Plan Note (Signed)
The patient and his wife is delighted to see complete response to treatment. We reviewed the current guidelines. I recommend observation moving forward with repeat blood work, history and examination in 3 months. I do not recommend adjuvant radiation therapy I will get his case presented at next week's hematology tumor board for further discussion

## 2015-11-28 NOTE — Assessment & Plan Note (Signed)
This is likely anemia of chronic disease. The patient denies recent history of bleeding such as epistaxis, hematuria or hematochezia. He is asymptomatic from the anemia. We will observe for now.  He does not require transfusion now.  I would defer ESA management to nephrologist.

## 2015-11-28 NOTE — Patient Instructions (Signed)

## 2015-11-30 DIAGNOSIS — N2581 Secondary hyperparathyroidism of renal origin: Secondary | ICD-10-CM | POA: Diagnosis not present

## 2015-11-30 DIAGNOSIS — N186 End stage renal disease: Secondary | ICD-10-CM | POA: Diagnosis not present

## 2015-11-30 DIAGNOSIS — D509 Iron deficiency anemia, unspecified: Secondary | ICD-10-CM | POA: Diagnosis not present

## 2015-11-30 DIAGNOSIS — D631 Anemia in chronic kidney disease: Secondary | ICD-10-CM | POA: Diagnosis not present

## 2015-11-30 DIAGNOSIS — E119 Type 2 diabetes mellitus without complications: Secondary | ICD-10-CM | POA: Diagnosis not present

## 2015-12-02 DIAGNOSIS — N186 End stage renal disease: Secondary | ICD-10-CM | POA: Diagnosis not present

## 2015-12-02 DIAGNOSIS — D509 Iron deficiency anemia, unspecified: Secondary | ICD-10-CM | POA: Diagnosis not present

## 2015-12-02 DIAGNOSIS — N2581 Secondary hyperparathyroidism of renal origin: Secondary | ICD-10-CM | POA: Diagnosis not present

## 2015-12-02 DIAGNOSIS — E119 Type 2 diabetes mellitus without complications: Secondary | ICD-10-CM | POA: Diagnosis not present

## 2015-12-02 DIAGNOSIS — D631 Anemia in chronic kidney disease: Secondary | ICD-10-CM | POA: Diagnosis not present

## 2015-12-05 DIAGNOSIS — D509 Iron deficiency anemia, unspecified: Secondary | ICD-10-CM | POA: Diagnosis not present

## 2015-12-05 DIAGNOSIS — N186 End stage renal disease: Secondary | ICD-10-CM | POA: Diagnosis not present

## 2015-12-05 DIAGNOSIS — D631 Anemia in chronic kidney disease: Secondary | ICD-10-CM | POA: Diagnosis not present

## 2015-12-05 DIAGNOSIS — N2581 Secondary hyperparathyroidism of renal origin: Secondary | ICD-10-CM | POA: Diagnosis not present

## 2015-12-05 DIAGNOSIS — E119 Type 2 diabetes mellitus without complications: Secondary | ICD-10-CM | POA: Diagnosis not present

## 2015-12-07 DIAGNOSIS — D631 Anemia in chronic kidney disease: Secondary | ICD-10-CM | POA: Diagnosis not present

## 2015-12-07 DIAGNOSIS — N2581 Secondary hyperparathyroidism of renal origin: Secondary | ICD-10-CM | POA: Diagnosis not present

## 2015-12-07 DIAGNOSIS — N186 End stage renal disease: Secondary | ICD-10-CM | POA: Diagnosis not present

## 2015-12-07 DIAGNOSIS — E119 Type 2 diabetes mellitus without complications: Secondary | ICD-10-CM | POA: Diagnosis not present

## 2015-12-07 DIAGNOSIS — D509 Iron deficiency anemia, unspecified: Secondary | ICD-10-CM | POA: Diagnosis not present

## 2015-12-09 DIAGNOSIS — D509 Iron deficiency anemia, unspecified: Secondary | ICD-10-CM | POA: Diagnosis not present

## 2015-12-09 DIAGNOSIS — E119 Type 2 diabetes mellitus without complications: Secondary | ICD-10-CM | POA: Diagnosis not present

## 2015-12-09 DIAGNOSIS — N2581 Secondary hyperparathyroidism of renal origin: Secondary | ICD-10-CM | POA: Diagnosis not present

## 2015-12-09 DIAGNOSIS — D631 Anemia in chronic kidney disease: Secondary | ICD-10-CM | POA: Diagnosis not present

## 2015-12-09 DIAGNOSIS — N186 End stage renal disease: Secondary | ICD-10-CM | POA: Diagnosis not present

## 2015-12-12 DIAGNOSIS — N2581 Secondary hyperparathyroidism of renal origin: Secondary | ICD-10-CM | POA: Diagnosis not present

## 2015-12-12 DIAGNOSIS — E119 Type 2 diabetes mellitus without complications: Secondary | ICD-10-CM | POA: Diagnosis not present

## 2015-12-12 DIAGNOSIS — N186 End stage renal disease: Secondary | ICD-10-CM | POA: Diagnosis not present

## 2015-12-12 DIAGNOSIS — D509 Iron deficiency anemia, unspecified: Secondary | ICD-10-CM | POA: Diagnosis not present

## 2015-12-12 DIAGNOSIS — D631 Anemia in chronic kidney disease: Secondary | ICD-10-CM | POA: Diagnosis not present

## 2015-12-14 DIAGNOSIS — D509 Iron deficiency anemia, unspecified: Secondary | ICD-10-CM | POA: Diagnosis not present

## 2015-12-14 DIAGNOSIS — N2581 Secondary hyperparathyroidism of renal origin: Secondary | ICD-10-CM | POA: Diagnosis not present

## 2015-12-14 DIAGNOSIS — D631 Anemia in chronic kidney disease: Secondary | ICD-10-CM | POA: Diagnosis not present

## 2015-12-14 DIAGNOSIS — E119 Type 2 diabetes mellitus without complications: Secondary | ICD-10-CM | POA: Diagnosis not present

## 2015-12-14 DIAGNOSIS — N186 End stage renal disease: Secondary | ICD-10-CM | POA: Diagnosis not present

## 2015-12-16 DIAGNOSIS — D631 Anemia in chronic kidney disease: Secondary | ICD-10-CM | POA: Diagnosis not present

## 2015-12-16 DIAGNOSIS — E119 Type 2 diabetes mellitus without complications: Secondary | ICD-10-CM | POA: Diagnosis not present

## 2015-12-16 DIAGNOSIS — N2581 Secondary hyperparathyroidism of renal origin: Secondary | ICD-10-CM | POA: Diagnosis not present

## 2015-12-16 DIAGNOSIS — D509 Iron deficiency anemia, unspecified: Secondary | ICD-10-CM | POA: Diagnosis not present

## 2015-12-16 DIAGNOSIS — N186 End stage renal disease: Secondary | ICD-10-CM | POA: Diagnosis not present

## 2015-12-18 DIAGNOSIS — N186 End stage renal disease: Secondary | ICD-10-CM | POA: Diagnosis not present

## 2015-12-18 DIAGNOSIS — D509 Iron deficiency anemia, unspecified: Secondary | ICD-10-CM | POA: Diagnosis not present

## 2015-12-18 DIAGNOSIS — E119 Type 2 diabetes mellitus without complications: Secondary | ICD-10-CM | POA: Diagnosis not present

## 2015-12-18 DIAGNOSIS — N2581 Secondary hyperparathyroidism of renal origin: Secondary | ICD-10-CM | POA: Diagnosis not present

## 2015-12-18 DIAGNOSIS — D631 Anemia in chronic kidney disease: Secondary | ICD-10-CM | POA: Diagnosis not present

## 2015-12-20 DIAGNOSIS — D509 Iron deficiency anemia, unspecified: Secondary | ICD-10-CM | POA: Diagnosis not present

## 2015-12-20 DIAGNOSIS — N186 End stage renal disease: Secondary | ICD-10-CM | POA: Diagnosis not present

## 2015-12-20 DIAGNOSIS — D631 Anemia in chronic kidney disease: Secondary | ICD-10-CM | POA: Diagnosis not present

## 2015-12-20 DIAGNOSIS — E119 Type 2 diabetes mellitus without complications: Secondary | ICD-10-CM | POA: Diagnosis not present

## 2015-12-20 DIAGNOSIS — N2581 Secondary hyperparathyroidism of renal origin: Secondary | ICD-10-CM | POA: Diagnosis not present

## 2015-12-23 DIAGNOSIS — D509 Iron deficiency anemia, unspecified: Secondary | ICD-10-CM | POA: Diagnosis not present

## 2015-12-23 DIAGNOSIS — D631 Anemia in chronic kidney disease: Secondary | ICD-10-CM | POA: Diagnosis not present

## 2015-12-23 DIAGNOSIS — N2581 Secondary hyperparathyroidism of renal origin: Secondary | ICD-10-CM | POA: Diagnosis not present

## 2015-12-23 DIAGNOSIS — E119 Type 2 diabetes mellitus without complications: Secondary | ICD-10-CM | POA: Diagnosis not present

## 2015-12-23 DIAGNOSIS — N186 End stage renal disease: Secondary | ICD-10-CM | POA: Diagnosis not present

## 2015-12-26 DIAGNOSIS — E119 Type 2 diabetes mellitus without complications: Secondary | ICD-10-CM | POA: Diagnosis not present

## 2015-12-26 DIAGNOSIS — D509 Iron deficiency anemia, unspecified: Secondary | ICD-10-CM | POA: Diagnosis not present

## 2015-12-26 DIAGNOSIS — N186 End stage renal disease: Secondary | ICD-10-CM | POA: Diagnosis not present

## 2015-12-26 DIAGNOSIS — N2581 Secondary hyperparathyroidism of renal origin: Secondary | ICD-10-CM | POA: Diagnosis not present

## 2015-12-26 DIAGNOSIS — D631 Anemia in chronic kidney disease: Secondary | ICD-10-CM | POA: Diagnosis not present

## 2015-12-27 DIAGNOSIS — L84 Corns and callosities: Secondary | ICD-10-CM | POA: Diagnosis not present

## 2015-12-27 DIAGNOSIS — N189 Chronic kidney disease, unspecified: Secondary | ICD-10-CM | POA: Diagnosis not present

## 2015-12-27 DIAGNOSIS — E1151 Type 2 diabetes mellitus with diabetic peripheral angiopathy without gangrene: Secondary | ICD-10-CM | POA: Diagnosis not present

## 2015-12-27 DIAGNOSIS — L602 Onychogryphosis: Secondary | ICD-10-CM | POA: Diagnosis not present

## 2015-12-28 DIAGNOSIS — D509 Iron deficiency anemia, unspecified: Secondary | ICD-10-CM | POA: Diagnosis not present

## 2015-12-28 DIAGNOSIS — N186 End stage renal disease: Secondary | ICD-10-CM | POA: Diagnosis not present

## 2015-12-28 DIAGNOSIS — E119 Type 2 diabetes mellitus without complications: Secondary | ICD-10-CM | POA: Diagnosis not present

## 2015-12-28 DIAGNOSIS — D631 Anemia in chronic kidney disease: Secondary | ICD-10-CM | POA: Diagnosis not present

## 2015-12-28 DIAGNOSIS — N2581 Secondary hyperparathyroidism of renal origin: Secondary | ICD-10-CM | POA: Diagnosis not present

## 2015-12-28 DIAGNOSIS — I129 Hypertensive chronic kidney disease with stage 1 through stage 4 chronic kidney disease, or unspecified chronic kidney disease: Secondary | ICD-10-CM | POA: Diagnosis not present

## 2015-12-28 DIAGNOSIS — Z992 Dependence on renal dialysis: Secondary | ICD-10-CM | POA: Diagnosis not present

## 2015-12-29 DIAGNOSIS — I871 Compression of vein: Secondary | ICD-10-CM | POA: Diagnosis not present

## 2015-12-29 DIAGNOSIS — Z992 Dependence on renal dialysis: Secondary | ICD-10-CM | POA: Diagnosis not present

## 2015-12-29 DIAGNOSIS — N186 End stage renal disease: Secondary | ICD-10-CM | POA: Diagnosis not present

## 2015-12-29 DIAGNOSIS — T82858A Stenosis of vascular prosthetic devices, implants and grafts, initial encounter: Secondary | ICD-10-CM | POA: Diagnosis not present

## 2015-12-30 DIAGNOSIS — D631 Anemia in chronic kidney disease: Secondary | ICD-10-CM | POA: Diagnosis not present

## 2015-12-30 DIAGNOSIS — N186 End stage renal disease: Secondary | ICD-10-CM | POA: Diagnosis not present

## 2015-12-30 DIAGNOSIS — D509 Iron deficiency anemia, unspecified: Secondary | ICD-10-CM | POA: Diagnosis not present

## 2015-12-30 DIAGNOSIS — E119 Type 2 diabetes mellitus without complications: Secondary | ICD-10-CM | POA: Diagnosis not present

## 2015-12-30 DIAGNOSIS — N2581 Secondary hyperparathyroidism of renal origin: Secondary | ICD-10-CM | POA: Diagnosis not present

## 2016-01-02 DIAGNOSIS — D509 Iron deficiency anemia, unspecified: Secondary | ICD-10-CM | POA: Diagnosis not present

## 2016-01-02 DIAGNOSIS — N2581 Secondary hyperparathyroidism of renal origin: Secondary | ICD-10-CM | POA: Diagnosis not present

## 2016-01-02 DIAGNOSIS — D631 Anemia in chronic kidney disease: Secondary | ICD-10-CM | POA: Diagnosis not present

## 2016-01-02 DIAGNOSIS — E119 Type 2 diabetes mellitus without complications: Secondary | ICD-10-CM | POA: Diagnosis not present

## 2016-01-02 DIAGNOSIS — N186 End stage renal disease: Secondary | ICD-10-CM | POA: Diagnosis not present

## 2016-01-04 DIAGNOSIS — N186 End stage renal disease: Secondary | ICD-10-CM | POA: Diagnosis not present

## 2016-01-04 DIAGNOSIS — E119 Type 2 diabetes mellitus without complications: Secondary | ICD-10-CM | POA: Diagnosis not present

## 2016-01-04 DIAGNOSIS — D509 Iron deficiency anemia, unspecified: Secondary | ICD-10-CM | POA: Diagnosis not present

## 2016-01-04 DIAGNOSIS — D631 Anemia in chronic kidney disease: Secondary | ICD-10-CM | POA: Diagnosis not present

## 2016-01-04 DIAGNOSIS — N2581 Secondary hyperparathyroidism of renal origin: Secondary | ICD-10-CM | POA: Diagnosis not present

## 2016-01-06 DIAGNOSIS — E119 Type 2 diabetes mellitus without complications: Secondary | ICD-10-CM | POA: Diagnosis not present

## 2016-01-06 DIAGNOSIS — D509 Iron deficiency anemia, unspecified: Secondary | ICD-10-CM | POA: Diagnosis not present

## 2016-01-06 DIAGNOSIS — N186 End stage renal disease: Secondary | ICD-10-CM | POA: Diagnosis not present

## 2016-01-06 DIAGNOSIS — N2581 Secondary hyperparathyroidism of renal origin: Secondary | ICD-10-CM | POA: Diagnosis not present

## 2016-01-06 DIAGNOSIS — D631 Anemia in chronic kidney disease: Secondary | ICD-10-CM | POA: Diagnosis not present

## 2016-01-08 ENCOUNTER — Ambulatory Visit (HOSPITAL_BASED_OUTPATIENT_CLINIC_OR_DEPARTMENT_OTHER): Payer: Medicare Other

## 2016-01-08 DIAGNOSIS — Z452 Encounter for adjustment and management of vascular access device: Secondary | ICD-10-CM

## 2016-01-08 DIAGNOSIS — Z95828 Presence of other vascular implants and grafts: Secondary | ICD-10-CM

## 2016-01-08 DIAGNOSIS — C8338 Diffuse large B-cell lymphoma, lymph nodes of multiple sites: Secondary | ICD-10-CM

## 2016-01-08 MED ORDER — HEPARIN SOD (PORK) LOCK FLUSH 100 UNIT/ML IV SOLN
500.0000 [IU] | Freq: Once | INTRAVENOUS | Status: AC | PRN
  Administered 2016-01-08: 500 [IU] via INTRAVENOUS
  Filled 2016-01-08: qty 5

## 2016-01-08 MED ORDER — SODIUM CHLORIDE 0.9 % IJ SOLN
10.0000 mL | INTRAMUSCULAR | Status: DC | PRN
  Administered 2016-01-08: 10 mL via INTRAVENOUS
  Filled 2016-01-08: qty 10

## 2016-01-09 DIAGNOSIS — E119 Type 2 diabetes mellitus without complications: Secondary | ICD-10-CM | POA: Diagnosis not present

## 2016-01-09 DIAGNOSIS — N2581 Secondary hyperparathyroidism of renal origin: Secondary | ICD-10-CM | POA: Diagnosis not present

## 2016-01-09 DIAGNOSIS — D631 Anemia in chronic kidney disease: Secondary | ICD-10-CM | POA: Diagnosis not present

## 2016-01-09 DIAGNOSIS — N186 End stage renal disease: Secondary | ICD-10-CM | POA: Diagnosis not present

## 2016-01-09 DIAGNOSIS — D509 Iron deficiency anemia, unspecified: Secondary | ICD-10-CM | POA: Diagnosis not present

## 2016-01-11 DIAGNOSIS — N2581 Secondary hyperparathyroidism of renal origin: Secondary | ICD-10-CM | POA: Diagnosis not present

## 2016-01-11 DIAGNOSIS — N186 End stage renal disease: Secondary | ICD-10-CM | POA: Diagnosis not present

## 2016-01-11 DIAGNOSIS — E119 Type 2 diabetes mellitus without complications: Secondary | ICD-10-CM | POA: Diagnosis not present

## 2016-01-11 DIAGNOSIS — D509 Iron deficiency anemia, unspecified: Secondary | ICD-10-CM | POA: Diagnosis not present

## 2016-01-11 DIAGNOSIS — D631 Anemia in chronic kidney disease: Secondary | ICD-10-CM | POA: Diagnosis not present

## 2016-01-13 DIAGNOSIS — N2581 Secondary hyperparathyroidism of renal origin: Secondary | ICD-10-CM | POA: Diagnosis not present

## 2016-01-13 DIAGNOSIS — D509 Iron deficiency anemia, unspecified: Secondary | ICD-10-CM | POA: Diagnosis not present

## 2016-01-13 DIAGNOSIS — D631 Anemia in chronic kidney disease: Secondary | ICD-10-CM | POA: Diagnosis not present

## 2016-01-13 DIAGNOSIS — E119 Type 2 diabetes mellitus without complications: Secondary | ICD-10-CM | POA: Diagnosis not present

## 2016-01-13 DIAGNOSIS — N186 End stage renal disease: Secondary | ICD-10-CM | POA: Diagnosis not present

## 2016-01-16 DIAGNOSIS — N186 End stage renal disease: Secondary | ICD-10-CM | POA: Diagnosis not present

## 2016-01-16 DIAGNOSIS — D631 Anemia in chronic kidney disease: Secondary | ICD-10-CM | POA: Diagnosis not present

## 2016-01-16 DIAGNOSIS — N2581 Secondary hyperparathyroidism of renal origin: Secondary | ICD-10-CM | POA: Diagnosis not present

## 2016-01-16 DIAGNOSIS — D509 Iron deficiency anemia, unspecified: Secondary | ICD-10-CM | POA: Diagnosis not present

## 2016-01-16 DIAGNOSIS — E119 Type 2 diabetes mellitus without complications: Secondary | ICD-10-CM | POA: Diagnosis not present

## 2016-01-18 DIAGNOSIS — N186 End stage renal disease: Secondary | ICD-10-CM | POA: Diagnosis not present

## 2016-01-18 DIAGNOSIS — N2581 Secondary hyperparathyroidism of renal origin: Secondary | ICD-10-CM | POA: Diagnosis not present

## 2016-01-18 DIAGNOSIS — E119 Type 2 diabetes mellitus without complications: Secondary | ICD-10-CM | POA: Diagnosis not present

## 2016-01-18 DIAGNOSIS — D509 Iron deficiency anemia, unspecified: Secondary | ICD-10-CM | POA: Diagnosis not present

## 2016-01-18 DIAGNOSIS — D631 Anemia in chronic kidney disease: Secondary | ICD-10-CM | POA: Diagnosis not present

## 2016-01-20 DIAGNOSIS — N2581 Secondary hyperparathyroidism of renal origin: Secondary | ICD-10-CM | POA: Diagnosis not present

## 2016-01-20 DIAGNOSIS — N186 End stage renal disease: Secondary | ICD-10-CM | POA: Diagnosis not present

## 2016-01-20 DIAGNOSIS — E119 Type 2 diabetes mellitus without complications: Secondary | ICD-10-CM | POA: Diagnosis not present

## 2016-01-20 DIAGNOSIS — D631 Anemia in chronic kidney disease: Secondary | ICD-10-CM | POA: Diagnosis not present

## 2016-01-20 DIAGNOSIS — D509 Iron deficiency anemia, unspecified: Secondary | ICD-10-CM | POA: Diagnosis not present

## 2016-01-23 DIAGNOSIS — E119 Type 2 diabetes mellitus without complications: Secondary | ICD-10-CM | POA: Diagnosis not present

## 2016-01-23 DIAGNOSIS — D631 Anemia in chronic kidney disease: Secondary | ICD-10-CM | POA: Diagnosis not present

## 2016-01-23 DIAGNOSIS — D509 Iron deficiency anemia, unspecified: Secondary | ICD-10-CM | POA: Diagnosis not present

## 2016-01-23 DIAGNOSIS — N2581 Secondary hyperparathyroidism of renal origin: Secondary | ICD-10-CM | POA: Diagnosis not present

## 2016-01-23 DIAGNOSIS — N186 End stage renal disease: Secondary | ICD-10-CM | POA: Diagnosis not present

## 2016-01-25 DIAGNOSIS — N186 End stage renal disease: Secondary | ICD-10-CM | POA: Diagnosis not present

## 2016-01-25 DIAGNOSIS — D509 Iron deficiency anemia, unspecified: Secondary | ICD-10-CM | POA: Diagnosis not present

## 2016-01-25 DIAGNOSIS — E119 Type 2 diabetes mellitus without complications: Secondary | ICD-10-CM | POA: Diagnosis not present

## 2016-01-25 DIAGNOSIS — D631 Anemia in chronic kidney disease: Secondary | ICD-10-CM | POA: Diagnosis not present

## 2016-01-25 DIAGNOSIS — N2581 Secondary hyperparathyroidism of renal origin: Secondary | ICD-10-CM | POA: Diagnosis not present

## 2016-01-27 DIAGNOSIS — D509 Iron deficiency anemia, unspecified: Secondary | ICD-10-CM | POA: Diagnosis not present

## 2016-01-27 DIAGNOSIS — N186 End stage renal disease: Secondary | ICD-10-CM | POA: Diagnosis not present

## 2016-01-27 DIAGNOSIS — E119 Type 2 diabetes mellitus without complications: Secondary | ICD-10-CM | POA: Diagnosis not present

## 2016-01-27 DIAGNOSIS — N2581 Secondary hyperparathyroidism of renal origin: Secondary | ICD-10-CM | POA: Diagnosis not present

## 2016-01-27 DIAGNOSIS — D631 Anemia in chronic kidney disease: Secondary | ICD-10-CM | POA: Diagnosis not present

## 2016-01-28 DIAGNOSIS — Z992 Dependence on renal dialysis: Secondary | ICD-10-CM | POA: Diagnosis not present

## 2016-01-28 DIAGNOSIS — I129 Hypertensive chronic kidney disease with stage 1 through stage 4 chronic kidney disease, or unspecified chronic kidney disease: Secondary | ICD-10-CM | POA: Diagnosis not present

## 2016-01-28 DIAGNOSIS — N186 End stage renal disease: Secondary | ICD-10-CM | POA: Diagnosis not present

## 2016-01-30 DIAGNOSIS — N2581 Secondary hyperparathyroidism of renal origin: Secondary | ICD-10-CM | POA: Diagnosis not present

## 2016-01-30 DIAGNOSIS — E119 Type 2 diabetes mellitus without complications: Secondary | ICD-10-CM | POA: Diagnosis not present

## 2016-01-30 DIAGNOSIS — D631 Anemia in chronic kidney disease: Secondary | ICD-10-CM | POA: Diagnosis not present

## 2016-01-30 DIAGNOSIS — Z992 Dependence on renal dialysis: Secondary | ICD-10-CM | POA: Diagnosis not present

## 2016-01-30 DIAGNOSIS — N186 End stage renal disease: Secondary | ICD-10-CM | POA: Diagnosis not present

## 2016-01-30 DIAGNOSIS — D509 Iron deficiency anemia, unspecified: Secondary | ICD-10-CM | POA: Diagnosis not present

## 2016-02-01 DIAGNOSIS — E119 Type 2 diabetes mellitus without complications: Secondary | ICD-10-CM | POA: Diagnosis not present

## 2016-02-01 DIAGNOSIS — D631 Anemia in chronic kidney disease: Secondary | ICD-10-CM | POA: Diagnosis not present

## 2016-02-01 DIAGNOSIS — N186 End stage renal disease: Secondary | ICD-10-CM | POA: Diagnosis not present

## 2016-02-01 DIAGNOSIS — D509 Iron deficiency anemia, unspecified: Secondary | ICD-10-CM | POA: Diagnosis not present

## 2016-02-01 DIAGNOSIS — N2581 Secondary hyperparathyroidism of renal origin: Secondary | ICD-10-CM | POA: Diagnosis not present

## 2016-02-01 DIAGNOSIS — Z992 Dependence on renal dialysis: Secondary | ICD-10-CM | POA: Diagnosis not present

## 2016-02-03 DIAGNOSIS — D631 Anemia in chronic kidney disease: Secondary | ICD-10-CM | POA: Diagnosis not present

## 2016-02-03 DIAGNOSIS — N2581 Secondary hyperparathyroidism of renal origin: Secondary | ICD-10-CM | POA: Diagnosis not present

## 2016-02-03 DIAGNOSIS — D509 Iron deficiency anemia, unspecified: Secondary | ICD-10-CM | POA: Diagnosis not present

## 2016-02-03 DIAGNOSIS — E119 Type 2 diabetes mellitus without complications: Secondary | ICD-10-CM | POA: Diagnosis not present

## 2016-02-03 DIAGNOSIS — N186 End stage renal disease: Secondary | ICD-10-CM | POA: Diagnosis not present

## 2016-02-03 DIAGNOSIS — Z992 Dependence on renal dialysis: Secondary | ICD-10-CM | POA: Diagnosis not present

## 2016-02-06 DIAGNOSIS — N2581 Secondary hyperparathyroidism of renal origin: Secondary | ICD-10-CM | POA: Diagnosis not present

## 2016-02-06 DIAGNOSIS — E119 Type 2 diabetes mellitus without complications: Secondary | ICD-10-CM | POA: Diagnosis not present

## 2016-02-06 DIAGNOSIS — D631 Anemia in chronic kidney disease: Secondary | ICD-10-CM | POA: Diagnosis not present

## 2016-02-06 DIAGNOSIS — D509 Iron deficiency anemia, unspecified: Secondary | ICD-10-CM | POA: Diagnosis not present

## 2016-02-06 DIAGNOSIS — Z992 Dependence on renal dialysis: Secondary | ICD-10-CM | POA: Diagnosis not present

## 2016-02-06 DIAGNOSIS — N186 End stage renal disease: Secondary | ICD-10-CM | POA: Diagnosis not present

## 2016-02-08 DIAGNOSIS — N186 End stage renal disease: Secondary | ICD-10-CM | POA: Diagnosis not present

## 2016-02-08 DIAGNOSIS — E119 Type 2 diabetes mellitus without complications: Secondary | ICD-10-CM | POA: Diagnosis not present

## 2016-02-08 DIAGNOSIS — N2581 Secondary hyperparathyroidism of renal origin: Secondary | ICD-10-CM | POA: Diagnosis not present

## 2016-02-08 DIAGNOSIS — D631 Anemia in chronic kidney disease: Secondary | ICD-10-CM | POA: Diagnosis not present

## 2016-02-08 DIAGNOSIS — D509 Iron deficiency anemia, unspecified: Secondary | ICD-10-CM | POA: Diagnosis not present

## 2016-02-08 DIAGNOSIS — Z992 Dependence on renal dialysis: Secondary | ICD-10-CM | POA: Diagnosis not present

## 2016-02-09 ENCOUNTER — Other Ambulatory Visit: Payer: Self-pay | Admitting: Hematology and Oncology

## 2016-02-10 DIAGNOSIS — N2581 Secondary hyperparathyroidism of renal origin: Secondary | ICD-10-CM | POA: Diagnosis not present

## 2016-02-10 DIAGNOSIS — D509 Iron deficiency anemia, unspecified: Secondary | ICD-10-CM | POA: Diagnosis not present

## 2016-02-10 DIAGNOSIS — E119 Type 2 diabetes mellitus without complications: Secondary | ICD-10-CM | POA: Diagnosis not present

## 2016-02-10 DIAGNOSIS — Z992 Dependence on renal dialysis: Secondary | ICD-10-CM | POA: Diagnosis not present

## 2016-02-10 DIAGNOSIS — D631 Anemia in chronic kidney disease: Secondary | ICD-10-CM | POA: Diagnosis not present

## 2016-02-10 DIAGNOSIS — N186 End stage renal disease: Secondary | ICD-10-CM | POA: Diagnosis not present

## 2016-02-13 DIAGNOSIS — N186 End stage renal disease: Secondary | ICD-10-CM | POA: Diagnosis not present

## 2016-02-13 DIAGNOSIS — D509 Iron deficiency anemia, unspecified: Secondary | ICD-10-CM | POA: Diagnosis not present

## 2016-02-13 DIAGNOSIS — N2581 Secondary hyperparathyroidism of renal origin: Secondary | ICD-10-CM | POA: Diagnosis not present

## 2016-02-13 DIAGNOSIS — Z992 Dependence on renal dialysis: Secondary | ICD-10-CM | POA: Diagnosis not present

## 2016-02-13 DIAGNOSIS — D631 Anemia in chronic kidney disease: Secondary | ICD-10-CM | POA: Diagnosis not present

## 2016-02-13 DIAGNOSIS — E119 Type 2 diabetes mellitus without complications: Secondary | ICD-10-CM | POA: Diagnosis not present

## 2016-02-15 DIAGNOSIS — D631 Anemia in chronic kidney disease: Secondary | ICD-10-CM | POA: Diagnosis not present

## 2016-02-15 DIAGNOSIS — D509 Iron deficiency anemia, unspecified: Secondary | ICD-10-CM | POA: Diagnosis not present

## 2016-02-15 DIAGNOSIS — Z992 Dependence on renal dialysis: Secondary | ICD-10-CM | POA: Diagnosis not present

## 2016-02-15 DIAGNOSIS — N186 End stage renal disease: Secondary | ICD-10-CM | POA: Diagnosis not present

## 2016-02-15 DIAGNOSIS — N2581 Secondary hyperparathyroidism of renal origin: Secondary | ICD-10-CM | POA: Diagnosis not present

## 2016-02-15 DIAGNOSIS — E119 Type 2 diabetes mellitus without complications: Secondary | ICD-10-CM | POA: Diagnosis not present

## 2016-02-17 DIAGNOSIS — D631 Anemia in chronic kidney disease: Secondary | ICD-10-CM | POA: Diagnosis not present

## 2016-02-17 DIAGNOSIS — N2581 Secondary hyperparathyroidism of renal origin: Secondary | ICD-10-CM | POA: Diagnosis not present

## 2016-02-17 DIAGNOSIS — D509 Iron deficiency anemia, unspecified: Secondary | ICD-10-CM | POA: Diagnosis not present

## 2016-02-17 DIAGNOSIS — Z992 Dependence on renal dialysis: Secondary | ICD-10-CM | POA: Diagnosis not present

## 2016-02-17 DIAGNOSIS — N186 End stage renal disease: Secondary | ICD-10-CM | POA: Diagnosis not present

## 2016-02-17 DIAGNOSIS — E119 Type 2 diabetes mellitus without complications: Secondary | ICD-10-CM | POA: Diagnosis not present

## 2016-02-20 DIAGNOSIS — D631 Anemia in chronic kidney disease: Secondary | ICD-10-CM | POA: Diagnosis not present

## 2016-02-20 DIAGNOSIS — E119 Type 2 diabetes mellitus without complications: Secondary | ICD-10-CM | POA: Diagnosis not present

## 2016-02-20 DIAGNOSIS — D509 Iron deficiency anemia, unspecified: Secondary | ICD-10-CM | POA: Diagnosis not present

## 2016-02-20 DIAGNOSIS — N186 End stage renal disease: Secondary | ICD-10-CM | POA: Diagnosis not present

## 2016-02-20 DIAGNOSIS — Z992 Dependence on renal dialysis: Secondary | ICD-10-CM | POA: Diagnosis not present

## 2016-02-20 DIAGNOSIS — N2581 Secondary hyperparathyroidism of renal origin: Secondary | ICD-10-CM | POA: Diagnosis not present

## 2016-02-22 DIAGNOSIS — D631 Anemia in chronic kidney disease: Secondary | ICD-10-CM | POA: Diagnosis not present

## 2016-02-22 DIAGNOSIS — N2581 Secondary hyperparathyroidism of renal origin: Secondary | ICD-10-CM | POA: Diagnosis not present

## 2016-02-22 DIAGNOSIS — E119 Type 2 diabetes mellitus without complications: Secondary | ICD-10-CM | POA: Diagnosis not present

## 2016-02-22 DIAGNOSIS — N186 End stage renal disease: Secondary | ICD-10-CM | POA: Diagnosis not present

## 2016-02-22 DIAGNOSIS — D509 Iron deficiency anemia, unspecified: Secondary | ICD-10-CM | POA: Diagnosis not present

## 2016-02-22 DIAGNOSIS — Z992 Dependence on renal dialysis: Secondary | ICD-10-CM | POA: Diagnosis not present

## 2016-02-24 DIAGNOSIS — E119 Type 2 diabetes mellitus without complications: Secondary | ICD-10-CM | POA: Diagnosis not present

## 2016-02-24 DIAGNOSIS — D509 Iron deficiency anemia, unspecified: Secondary | ICD-10-CM | POA: Diagnosis not present

## 2016-02-24 DIAGNOSIS — D631 Anemia in chronic kidney disease: Secondary | ICD-10-CM | POA: Diagnosis not present

## 2016-02-24 DIAGNOSIS — Z992 Dependence on renal dialysis: Secondary | ICD-10-CM | POA: Diagnosis not present

## 2016-02-24 DIAGNOSIS — N186 End stage renal disease: Secondary | ICD-10-CM | POA: Diagnosis not present

## 2016-02-24 DIAGNOSIS — N2581 Secondary hyperparathyroidism of renal origin: Secondary | ICD-10-CM | POA: Diagnosis not present

## 2016-02-26 ENCOUNTER — Ambulatory Visit (HOSPITAL_BASED_OUTPATIENT_CLINIC_OR_DEPARTMENT_OTHER): Payer: Medicare Other | Admitting: Hematology and Oncology

## 2016-02-26 ENCOUNTER — Telehealth: Payer: Self-pay | Admitting: Hematology and Oncology

## 2016-02-26 ENCOUNTER — Telehealth: Payer: Self-pay

## 2016-02-26 ENCOUNTER — Encounter: Payer: Self-pay | Admitting: Hematology and Oncology

## 2016-02-26 ENCOUNTER — Ambulatory Visit (HOSPITAL_BASED_OUTPATIENT_CLINIC_OR_DEPARTMENT_OTHER): Payer: Medicare Other

## 2016-02-26 ENCOUNTER — Other Ambulatory Visit (HOSPITAL_BASED_OUTPATIENT_CLINIC_OR_DEPARTMENT_OTHER): Payer: Medicare Other

## 2016-02-26 VITALS — BP 115/76 | HR 58 | Temp 97.8°F | Resp 1 | Ht 71.0 in | Wt 193.8 lb

## 2016-02-26 DIAGNOSIS — C8338 Diffuse large B-cell lymphoma, lymph nodes of multiple sites: Secondary | ICD-10-CM

## 2016-02-26 DIAGNOSIS — L239 Allergic contact dermatitis, unspecified cause: Secondary | ICD-10-CM | POA: Insufficient documentation

## 2016-02-26 DIAGNOSIS — L231 Allergic contact dermatitis due to adhesives: Secondary | ICD-10-CM | POA: Diagnosis not present

## 2016-02-26 DIAGNOSIS — Z95828 Presence of other vascular implants and grafts: Secondary | ICD-10-CM

## 2016-02-26 DIAGNOSIS — D649 Anemia, unspecified: Secondary | ICD-10-CM | POA: Diagnosis not present

## 2016-02-26 DIAGNOSIS — Z992 Dependence on renal dialysis: Secondary | ICD-10-CM | POA: Diagnosis not present

## 2016-02-26 DIAGNOSIS — N189 Chronic kidney disease, unspecified: Secondary | ICD-10-CM

## 2016-02-26 DIAGNOSIS — D631 Anemia in chronic kidney disease: Secondary | ICD-10-CM

## 2016-02-26 DIAGNOSIS — N186 End stage renal disease: Secondary | ICD-10-CM

## 2016-02-26 LAB — COMPREHENSIVE METABOLIC PANEL
ALBUMIN: 3.7 g/dL (ref 3.5–5.0)
ALK PHOS: 59 U/L (ref 40–150)
ALT: 16 U/L (ref 0–55)
AST: 17 U/L (ref 5–34)
Anion Gap: 15 mEq/L — ABNORMAL HIGH (ref 3–11)
BUN: 42.2 mg/dL — AB (ref 7.0–26.0)
CALCIUM: 9.2 mg/dL (ref 8.4–10.4)
CO2: 25 mEq/L (ref 22–29)
CREATININE: 9.5 mg/dL — AB (ref 0.7–1.3)
Chloride: 93 mEq/L — ABNORMAL LOW (ref 98–109)
EGFR: 6 mL/min/{1.73_m2} — ABNORMAL LOW (ref >90)
GLUCOSE: 91 mg/dL (ref 70–140)
Potassium: 4.3 mEq/L (ref 3.5–5.1)
Sodium: 134 mEq/L — ABNORMAL LOW (ref 136–145)
TOTAL PROTEIN: 7.1 g/dL (ref 6.4–8.3)
Total Bilirubin: 0.46 mg/dL (ref 0.20–1.20)

## 2016-02-26 LAB — CBC WITH DIFFERENTIAL/PLATELET
BASO%: 1.3 % (ref 0.0–2.0)
BASOS ABS: 0.1 10*3/uL (ref 0.0–0.1)
EOS%: 18.9 % — AB (ref 0.0–7.0)
Eosinophils Absolute: 0.9 10*3/uL — ABNORMAL HIGH (ref 0.0–0.5)
HEMATOCRIT: 36.1 % — AB (ref 38.4–49.9)
HEMOGLOBIN: 12 g/dL — AB (ref 13.0–17.1)
LYMPH#: 1 10*3/uL (ref 0.9–3.3)
LYMPH%: 21 % (ref 14.0–49.0)
MCH: 32.9 pg (ref 27.2–33.4)
MCHC: 33.3 g/dL (ref 32.0–36.0)
MCV: 98.7 fL — ABNORMAL HIGH (ref 79.3–98.0)
MONO#: 0.6 10*3/uL (ref 0.1–0.9)
MONO%: 12.6 % (ref 0.0–14.0)
NEUT%: 46.2 % (ref 39.0–75.0)
NEUTROS ABS: 2.3 10*3/uL (ref 1.5–6.5)
Platelets: 189 10*3/uL (ref 140–400)
RBC: 3.66 10*6/uL — ABNORMAL LOW (ref 4.20–5.82)
RDW: 16.3 % — AB (ref 11.0–14.6)
WBC: 4.9 10*3/uL (ref 4.0–10.3)

## 2016-02-26 MED ORDER — SODIUM CHLORIDE 0.9 % IJ SOLN
10.0000 mL | INTRAMUSCULAR | Status: DC | PRN
  Administered 2016-02-26: 10 mL via INTRAVENOUS
  Filled 2016-02-26: qty 10

## 2016-02-26 MED ORDER — HEPARIN SOD (PORK) LOCK FLUSH 100 UNIT/ML IV SOLN
500.0000 [IU] | Freq: Once | INTRAVENOUS | Status: AC | PRN
  Administered 2016-02-26: 500 [IU] via INTRAVENOUS
  Filled 2016-02-26: qty 5

## 2016-02-26 NOTE — Assessment & Plan Note (Signed)
This is likely anemia of chronic disease. The patient denies recent history of bleeding such as epistaxis, hematuria or hematochezia. He is asymptomatic from the anemia. We will observe for now.  He does not require transfusion now.  I would defer ESA management to nephrologist.

## 2016-02-26 NOTE — Telephone Encounter (Signed)
Appointments scheduled per 1/29 LOS. Patient given AVS report and calendars with future scheduled appointments. °

## 2016-02-26 NOTE — Addendum Note (Signed)
Addended by: Rainier Feuerborn, [place] S on: 02/26/2016 09:47 AM   Modules accepted: Orders

## 2016-02-26 NOTE — Progress Notes (Signed)
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 [facility])   06/07/2015 Initial Diagnosis    Non-Hodgkin lymphoma of intra-abdominal lymph nodes ([place])      06/12/2015 Imaging    CT chest showed lymphadenopathy within the low neck and low chest, consistent with active lymphoma      06/14/2015 Procedure    He underwent CT-guided biopsy      06/14/2015 Pathology Results    Biopsy of the retroperitoneal mass confirmed diffuse large B-cell lymphoma      06/21/2015 Imaging    ECHO showed normal EF      06/21/2015 Procedure    He has port placement      06/23/2015 - 10/12/2015 Chemotherapy    He received mini-RCHOP chemo x 6 cycles      08/25/2015 PET scan    Significant interval decrease in size of the bulky retroperitoneal lymphadenopathy suggesting an excellent response to therapy. Residual matted soft tissue density in the retroperitoneum with SUV max of approximately 3.3      11/27/2015 PET scan    Continued improved appearance of the treated lymphoma. Minimal residual matted soft tissue density but no discrete measurable disease and no hypermetabolism to suggest metabolically active tumor.       INTERVAL HISTORY: Please see below for problem oriented charting. He is seen today with his wife. He denies new lymphadenopathy. Appetite is stable, no recent weight loss. No recent infection. The only thing that bothers him is allergic dermatitis affecting his fistula site. He was seen by dermatologist and has been applying topical cream but it does not get better since he has repeat exposure 3 times a week during dialysis  REVIEW OF SYSTEMS:   Constitutional: Denies fevers, chills or abnormal  weight loss Eyes: Denies blurriness of vision Ears, nose, mouth, throat, and face: Denies mucositis or sore throat Respiratory: Denies cough, dyspnea or wheezes Cardiovascular: Denies palpitation, chest discomfort or lower extremity swelling Gastrointestinal:  Denies nausea, heartburn or change in bowel habits Lymphatics: Denies new lymphadenopathy or easy bruising Neurological:Denies numbness, tingling or new weaknesses Behavioral/Psych: Mood is stable, no new changes  All other systems were reviewed with the patient and are negative.  I have reviewed the past medical history, past surgical history, social history and family history with the patient and they are unchanged from previous note.  ALLERGIES:  has No Known Allergies.  MEDICATIONS:  Current Outpatient Prescriptions  Medication Sig Dispense Refill  . aspirin 325 MG tablet Take 1 tablet (325 mg total) by mouth daily. 30 tablet 0  . calcium acetate (PHOSLO) 667 MG capsule Take 1 capsule by mouth 2 (two) times daily.   11  . cloNIDine (CATAPRES) 0.2 MG tablet Take 0.4 mg by mouth 2 (two) times daily.     Marland Kitchen docusate sodium (COLACE) 100 MG capsule Take 1 capsule (100 mg total) by mouth every 12 (twelve) hours. (Patient taking differently: Take 100 mg by mouth every 12 (twelve) hours. ) 60 capsule 0  . isosorbide-hydrALAZINE (BIDIL) 20-37.5 MG tablet Take 0.5 tablets by mouth 2 (two) times daily.    Marland Kitchen levETIRAcetam (KEPPRA) 500 MG tablet Take 1 tablet (500 mg total) by mouth 2 (two) times daily. 60 tablet 6  . levothyroxine (  SYNTHROID, LEVOTHROID) 25 MCG tablet Take 25 mcg by mouth daily.   0  . lidocaine-prilocaine (EMLA) cream Apply to affected area once 30 g 3  . metoprolol (LOPRESSOR) 100 MG tablet Take 150 mg by mouth 2 (two) times daily.     . multivitamin (RENA-VIT) TABS tablet Take 1 tablet by mouth at bedtime. 90 tablet 0  . OVER THE COUNTER MEDICATION Take 1 capsule by mouth 2 (two) times daily. "Kidney Factor"    .  ondansetron (ZOFRAN ODT) 8 MG disintegrating tablet Take 1 tablet (8 mg total) by mouth every 8 (eight) hours as needed for nausea or vomiting. (Patient not taking: Reported on 08/04/2015) 12 tablet 0  . oxyCODONE-acetaminophen (PERCOCET/ROXICET) 5-325 MG tablet Take 1 tablet by mouth every 4 (four) hours as needed for moderate pain. (Patient not taking: Reported on 08/04/2015) 90 tablet 0  . pentafluoroprop-tetrafluoroeth (GEBAUERS) AERO Apply 1 application topically as needed (topical anesthesia for hemodialysis). (Patient not taking: Reported on 09/18/2015) 30 mL 0  . prochlorperazine (COMPAZINE) 10 MG tablet Take 1 tablet (10 mg total) by mouth every 6 (six) hours as needed (Nausea or vomiting). (Patient not taking: Reported on 08/04/2015) 30 tablet 6   No current facility-administered medications for this visit.     PHYSICAL EXAMINATION: ECOG PERFORMANCE STATUS: 1 - Symptomatic but completely ambulatory  Vitals:   02/26/16 0949  BP: 115/76  Pulse: (!) 58  Resp: (!) 1  Temp: 97.8 F (36.6 C)   Filed Weights   02/26/16 0949  Weight: 193 lb 12.8 oz (87.9 kg)    GENERAL:alert, no distress and comfortable SKIN:Noted signs of dermatitis near his dialysis fistula on the left upper arm  EYES: normal, Conjunctiva are pink and non-injected, sclera clear OROPHARYNX:no exudate, no erythema and lips, buccal mucosa, and tongue normal  NECK: supple, thyroid normal size, non-tender, without nodularity LYMPH:  no palpable lymphadenopathy in the cervical, axillary or inguinal LUNGS: clear to auscultation and percussion with normal breathing effort HEART: regular rate & rhythm and no murmurs and no lower extremity edema ABDOMEN:abdomen soft, non-tender and normal bowel sounds Musculoskeletal:no cyanosis of digits and no clubbing  NEURO: alert & oriented x 3 with fluent speech, 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  LABORATORY DATA:  I have reviewed the data as listed    Component Value  Date/Time   NA 134 (L) 10/11/2015 0753   K 4.7 10/11/2015 0753   CL 95 (L) 06/21/2015 1345   CO2 26 10/11/2015 0753   GLUCOSE 145 (H) 10/11/2015 0753   BUN 43.6 (H) 10/11/2015 0753   CREATININE 6.7 (HH) 10/11/2015 0753   CALCIUM 9.2 10/11/2015 0753   PROT 6.8 10/11/2015 0753   ALBUMIN 3.2 (L) 10/11/2015 0753   AST 20 10/11/2015 0753   ALT 17 10/11/2015 0753   ALKPHOS 79 10/11/2015 0753   BILITOT 0.43 10/11/2015 0753   GFRNONAA 8 (L) 06/21/2015 1345   GFRAA 9 (L) 06/21/2015 1345    No results found for: SPEP, UPEP  Lab Results  Component Value Date   WBC 4.9 02/26/2016   NEUTROABS 2.3 02/26/2016   HGB 12.0 (L) 02/26/2016   HCT 36.1 (L) 02/26/2016   MCV 98.7 (H) 02/26/2016   PLT 189 02/26/2016      Chemistry      Component Value Date/Time   NA 134 (L) 10/11/2015 0753   K 4.7 10/11/2015 0753   CL 95 (L) 06/21/2015 1345   CO2 26 10/11/2015 0753   BUN 43.6 (H) 10/11/2015  0753   CREATININE 6.7 (HH) 10/11/2015 0753      Component Value Date/Time   CALCIUM 9.2 10/11/2015 0753   ALKPHOS 79 10/11/2015 0753   AST 20 10/11/2015 0753   ALT 17 10/11/2015 0753   BILITOT 0.43 10/11/2015 0753      ASSESSMENT & PLAN:  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 I recommend observation moving forward with repeat blood work, history and examination in 3 months and imaging every 6 months. His next CT scan will be due around April 2018 I do not recommend adjuvant radiation therapy I will get his port flushed every 6 weeks to maintain port patency.  Anemia in chronic kidney disease This is likely anemia of chronic disease. The patient denies recent history of bleeding such as epistaxis, hematuria or hematochezia. He is asymptomatic from the anemia. We will observe for now.  He does not require transfusion now.  I would defer ESA management to  nephrologist.  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 He has significant hypereosinophilia. I will alert his nephrologist of this problem. He was seen by dermatologist and has been applying topical cream   Orders Placed This Encounter  Procedures  . CT ABDOMEN PELVIS WO CONTRAST    Standing Status:   Future    Standing Expiration Date:   05/28/2017    Order Specific Question:   Reason for Exam (SYMPTOM  OR DIAGNOSIS REQUIRED)    Answer:   staging lymphoma, exclude recurrence    Order Specific Question:   Preferred imaging location?    Answer:   [facility]  . CT CHEST WO CONTRAST    Standing Status:   Future    Standing Expiration Date:   04/27/2017    Order Specific Question:   Reason for Exam (SYMPTOM  OR DIAGNOSIS REQUIRED)    Answer:   staging lymphoma, exclude recurrence    Order Specific Question:   Preferred imaging location?    Answer:   [facility]   All questions were answered. The patient knows to call the clinic with any problems, questions or concerns. No barriers to learning was detected. I spent 15 minutes counseling the patient face to face. The total time spent in the appointment was 20 minutes and more than 50% was on counseling and review of test results     Heath Lark, MD 02/26/2016 10:08 AM

## 2016-02-26 NOTE — Assessment & Plan Note (Signed)
He has significant allergic dermatitis from tape that was used at the hemodialysis center. He has significant hypereosinophilia. I will alert his nephrologist of this problem. He was seen by dermatologist and has been applying topical cream

## 2016-02-26 NOTE — Assessment & Plan Note (Signed)
His last PET CT scan from October 2017 show complete response to treatment We reviewed the current guidelines. I recommend observation moving forward with repeat blood work, history and examination in 3 months and imaging every 6 months. His next CT scan will be due around April 2018 I do not recommend adjuvant radiation therapy I will get his port flushed every 6 weeks to maintain port patency.

## 2016-02-26 NOTE — Telephone Encounter (Signed)
Notified of allergic dermatitis to tape used at [facility]. They are aware, added to allergy list. Reaction to paper tape and they are using silk tape.

## 2016-02-27 DIAGNOSIS — D631 Anemia in chronic kidney disease: Secondary | ICD-10-CM | POA: Diagnosis not present

## 2016-02-27 DIAGNOSIS — Z992 Dependence on renal dialysis: Secondary | ICD-10-CM | POA: Diagnosis not present

## 2016-02-27 DIAGNOSIS — D509 Iron deficiency anemia, unspecified: Secondary | ICD-10-CM | POA: Diagnosis not present

## 2016-02-27 DIAGNOSIS — E119 Type 2 diabetes mellitus without complications: Secondary | ICD-10-CM | POA: Diagnosis not present

## 2016-02-27 DIAGNOSIS — N2581 Secondary hyperparathyroidism of renal origin: Secondary | ICD-10-CM | POA: Diagnosis not present

## 2016-02-27 DIAGNOSIS — N186 End stage renal disease: Secondary | ICD-10-CM | POA: Diagnosis not present

## 2016-02-28 DIAGNOSIS — I129 Hypertensive chronic kidney disease with stage 1 through stage 4 chronic kidney disease, or unspecified chronic kidney disease: Secondary | ICD-10-CM | POA: Diagnosis not present

## 2016-02-28 DIAGNOSIS — N186 End stage renal disease: Secondary | ICD-10-CM | POA: Diagnosis not present

## 2016-02-28 DIAGNOSIS — Z992 Dependence on renal dialysis: Secondary | ICD-10-CM | POA: Diagnosis not present

## 2016-02-29 DIAGNOSIS — N186 End stage renal disease: Secondary | ICD-10-CM | POA: Diagnosis not present

## 2016-02-29 DIAGNOSIS — D509 Iron deficiency anemia, unspecified: Secondary | ICD-10-CM | POA: Diagnosis not present

## 2016-02-29 DIAGNOSIS — D631 Anemia in chronic kidney disease: Secondary | ICD-10-CM | POA: Diagnosis not present

## 2016-02-29 DIAGNOSIS — E119 Type 2 diabetes mellitus without complications: Secondary | ICD-10-CM | POA: Diagnosis not present

## 2016-02-29 DIAGNOSIS — N2581 Secondary hyperparathyroidism of renal origin: Secondary | ICD-10-CM | POA: Diagnosis not present

## 2016-03-02 DIAGNOSIS — E119 Type 2 diabetes mellitus without complications: Secondary | ICD-10-CM | POA: Diagnosis not present

## 2016-03-02 DIAGNOSIS — D631 Anemia in chronic kidney disease: Secondary | ICD-10-CM | POA: Diagnosis not present

## 2016-03-02 DIAGNOSIS — D509 Iron deficiency anemia, unspecified: Secondary | ICD-10-CM | POA: Diagnosis not present

## 2016-03-02 DIAGNOSIS — N186 End stage renal disease: Secondary | ICD-10-CM | POA: Diagnosis not present

## 2016-03-02 DIAGNOSIS — N2581 Secondary hyperparathyroidism of renal origin: Secondary | ICD-10-CM | POA: Diagnosis not present

## 2016-03-05 DIAGNOSIS — N186 End stage renal disease: Secondary | ICD-10-CM | POA: Diagnosis not present

## 2016-03-05 DIAGNOSIS — E119 Type 2 diabetes mellitus without complications: Secondary | ICD-10-CM | POA: Diagnosis not present

## 2016-03-05 DIAGNOSIS — D631 Anemia in chronic kidney disease: Secondary | ICD-10-CM | POA: Diagnosis not present

## 2016-03-05 DIAGNOSIS — D509 Iron deficiency anemia, unspecified: Secondary | ICD-10-CM | POA: Diagnosis not present

## 2016-03-05 DIAGNOSIS — N2581 Secondary hyperparathyroidism of renal origin: Secondary | ICD-10-CM | POA: Diagnosis not present

## 2016-03-07 DIAGNOSIS — D631 Anemia in chronic kidney disease: Secondary | ICD-10-CM | POA: Diagnosis not present

## 2016-03-07 DIAGNOSIS — E119 Type 2 diabetes mellitus without complications: Secondary | ICD-10-CM | POA: Diagnosis not present

## 2016-03-07 DIAGNOSIS — N2581 Secondary hyperparathyroidism of renal origin: Secondary | ICD-10-CM | POA: Diagnosis not present

## 2016-03-07 DIAGNOSIS — N186 End stage renal disease: Secondary | ICD-10-CM | POA: Diagnosis not present

## 2016-03-07 DIAGNOSIS — D509 Iron deficiency anemia, unspecified: Secondary | ICD-10-CM | POA: Diagnosis not present

## 2016-03-09 DIAGNOSIS — N2581 Secondary hyperparathyroidism of renal origin: Secondary | ICD-10-CM | POA: Diagnosis not present

## 2016-03-09 DIAGNOSIS — D631 Anemia in chronic kidney disease: Secondary | ICD-10-CM | POA: Diagnosis not present

## 2016-03-09 DIAGNOSIS — N186 End stage renal disease: Secondary | ICD-10-CM | POA: Diagnosis not present

## 2016-03-09 DIAGNOSIS — D509 Iron deficiency anemia, unspecified: Secondary | ICD-10-CM | POA: Diagnosis not present

## 2016-03-09 DIAGNOSIS — E119 Type 2 diabetes mellitus without complications: Secondary | ICD-10-CM | POA: Diagnosis not present

## 2016-03-12 DIAGNOSIS — N186 End stage renal disease: Secondary | ICD-10-CM | POA: Diagnosis not present

## 2016-03-12 DIAGNOSIS — N2581 Secondary hyperparathyroidism of renal origin: Secondary | ICD-10-CM | POA: Diagnosis not present

## 2016-03-12 DIAGNOSIS — D631 Anemia in chronic kidney disease: Secondary | ICD-10-CM | POA: Diagnosis not present

## 2016-03-12 DIAGNOSIS — E119 Type 2 diabetes mellitus without complications: Secondary | ICD-10-CM | POA: Diagnosis not present

## 2016-03-12 DIAGNOSIS — D509 Iron deficiency anemia, unspecified: Secondary | ICD-10-CM | POA: Diagnosis not present

## 2016-03-14 DIAGNOSIS — N2581 Secondary hyperparathyroidism of renal origin: Secondary | ICD-10-CM | POA: Diagnosis not present

## 2016-03-14 DIAGNOSIS — D509 Iron deficiency anemia, unspecified: Secondary | ICD-10-CM | POA: Diagnosis not present

## 2016-03-14 DIAGNOSIS — N186 End stage renal disease: Secondary | ICD-10-CM | POA: Diagnosis not present

## 2016-03-14 DIAGNOSIS — E119 Type 2 diabetes mellitus without complications: Secondary | ICD-10-CM | POA: Diagnosis not present

## 2016-03-14 DIAGNOSIS — D631 Anemia in chronic kidney disease: Secondary | ICD-10-CM | POA: Diagnosis not present

## 2016-03-16 DIAGNOSIS — N186 End stage renal disease: Secondary | ICD-10-CM | POA: Diagnosis not present

## 2016-03-16 DIAGNOSIS — E119 Type 2 diabetes mellitus without complications: Secondary | ICD-10-CM | POA: Diagnosis not present

## 2016-03-16 DIAGNOSIS — D509 Iron deficiency anemia, unspecified: Secondary | ICD-10-CM | POA: Diagnosis not present

## 2016-03-16 DIAGNOSIS — D631 Anemia in chronic kidney disease: Secondary | ICD-10-CM | POA: Diagnosis not present

## 2016-03-16 DIAGNOSIS — N2581 Secondary hyperparathyroidism of renal origin: Secondary | ICD-10-CM | POA: Diagnosis not present

## 2016-03-19 DIAGNOSIS — D631 Anemia in chronic kidney disease: Secondary | ICD-10-CM | POA: Diagnosis not present

## 2016-03-19 DIAGNOSIS — N186 End stage renal disease: Secondary | ICD-10-CM | POA: Diagnosis not present

## 2016-03-19 DIAGNOSIS — D509 Iron deficiency anemia, unspecified: Secondary | ICD-10-CM | POA: Diagnosis not present

## 2016-03-19 DIAGNOSIS — N2581 Secondary hyperparathyroidism of renal origin: Secondary | ICD-10-CM | POA: Diagnosis not present

## 2016-03-19 DIAGNOSIS — E119 Type 2 diabetes mellitus without complications: Secondary | ICD-10-CM | POA: Diagnosis not present

## 2016-03-21 DIAGNOSIS — D631 Anemia in chronic kidney disease: Secondary | ICD-10-CM | POA: Diagnosis not present

## 2016-03-21 DIAGNOSIS — E119 Type 2 diabetes mellitus without complications: Secondary | ICD-10-CM | POA: Diagnosis not present

## 2016-03-21 DIAGNOSIS — N186 End stage renal disease: Secondary | ICD-10-CM | POA: Diagnosis not present

## 2016-03-21 DIAGNOSIS — N2581 Secondary hyperparathyroidism of renal origin: Secondary | ICD-10-CM | POA: Diagnosis not present

## 2016-03-21 DIAGNOSIS — D509 Iron deficiency anemia, unspecified: Secondary | ICD-10-CM | POA: Diagnosis not present

## 2016-03-23 DIAGNOSIS — N2581 Secondary hyperparathyroidism of renal origin: Secondary | ICD-10-CM | POA: Diagnosis not present

## 2016-03-23 DIAGNOSIS — D631 Anemia in chronic kidney disease: Secondary | ICD-10-CM | POA: Diagnosis not present

## 2016-03-23 DIAGNOSIS — D509 Iron deficiency anemia, unspecified: Secondary | ICD-10-CM | POA: Diagnosis not present

## 2016-03-23 DIAGNOSIS — N186 End stage renal disease: Secondary | ICD-10-CM | POA: Diagnosis not present

## 2016-03-23 DIAGNOSIS — E119 Type 2 diabetes mellitus without complications: Secondary | ICD-10-CM | POA: Diagnosis not present

## 2016-03-26 DIAGNOSIS — D509 Iron deficiency anemia, unspecified: Secondary | ICD-10-CM | POA: Diagnosis not present

## 2016-03-26 DIAGNOSIS — D631 Anemia in chronic kidney disease: Secondary | ICD-10-CM | POA: Diagnosis not present

## 2016-03-26 DIAGNOSIS — N186 End stage renal disease: Secondary | ICD-10-CM | POA: Diagnosis not present

## 2016-03-26 DIAGNOSIS — E119 Type 2 diabetes mellitus without complications: Secondary | ICD-10-CM | POA: Diagnosis not present

## 2016-03-26 DIAGNOSIS — N2581 Secondary hyperparathyroidism of renal origin: Secondary | ICD-10-CM | POA: Diagnosis not present

## 2016-03-27 DIAGNOSIS — Z992 Dependence on renal dialysis: Secondary | ICD-10-CM | POA: Diagnosis not present

## 2016-03-27 DIAGNOSIS — E1351 Other specified diabetes mellitus with diabetic peripheral angiopathy without gangrene: Secondary | ICD-10-CM | POA: Diagnosis not present

## 2016-03-27 DIAGNOSIS — L84 Corns and callosities: Secondary | ICD-10-CM | POA: Diagnosis not present

## 2016-03-27 DIAGNOSIS — I129 Hypertensive chronic kidney disease with stage 1 through stage 4 chronic kidney disease, or unspecified chronic kidney disease: Secondary | ICD-10-CM | POA: Diagnosis not present

## 2016-03-27 DIAGNOSIS — L602 Onychogryphosis: Secondary | ICD-10-CM | POA: Diagnosis not present

## 2016-03-27 DIAGNOSIS — N186 End stage renal disease: Secondary | ICD-10-CM | POA: Diagnosis not present

## 2016-03-28 DIAGNOSIS — E119 Type 2 diabetes mellitus without complications: Secondary | ICD-10-CM | POA: Diagnosis not present

## 2016-03-28 DIAGNOSIS — N2581 Secondary hyperparathyroidism of renal origin: Secondary | ICD-10-CM | POA: Diagnosis not present

## 2016-03-28 DIAGNOSIS — D509 Iron deficiency anemia, unspecified: Secondary | ICD-10-CM | POA: Diagnosis not present

## 2016-03-28 DIAGNOSIS — D631 Anemia in chronic kidney disease: Secondary | ICD-10-CM | POA: Diagnosis not present

## 2016-03-28 DIAGNOSIS — N186 End stage renal disease: Secondary | ICD-10-CM | POA: Diagnosis not present

## 2016-03-30 DIAGNOSIS — N2581 Secondary hyperparathyroidism of renal origin: Secondary | ICD-10-CM | POA: Diagnosis not present

## 2016-03-30 DIAGNOSIS — D631 Anemia in chronic kidney disease: Secondary | ICD-10-CM | POA: Diagnosis not present

## 2016-03-30 DIAGNOSIS — N186 End stage renal disease: Secondary | ICD-10-CM | POA: Diagnosis not present

## 2016-03-30 DIAGNOSIS — D509 Iron deficiency anemia, unspecified: Secondary | ICD-10-CM | POA: Diagnosis not present

## 2016-03-30 DIAGNOSIS — E119 Type 2 diabetes mellitus without complications: Secondary | ICD-10-CM | POA: Diagnosis not present

## 2016-04-02 DIAGNOSIS — N186 End stage renal disease: Secondary | ICD-10-CM | POA: Diagnosis not present

## 2016-04-02 DIAGNOSIS — E119 Type 2 diabetes mellitus without complications: Secondary | ICD-10-CM | POA: Diagnosis not present

## 2016-04-02 DIAGNOSIS — D631 Anemia in chronic kidney disease: Secondary | ICD-10-CM | POA: Diagnosis not present

## 2016-04-02 DIAGNOSIS — N2581 Secondary hyperparathyroidism of renal origin: Secondary | ICD-10-CM | POA: Diagnosis not present

## 2016-04-02 DIAGNOSIS — D509 Iron deficiency anemia, unspecified: Secondary | ICD-10-CM | POA: Diagnosis not present

## 2016-04-04 DIAGNOSIS — D631 Anemia in chronic kidney disease: Secondary | ICD-10-CM | POA: Diagnosis not present

## 2016-04-04 DIAGNOSIS — D509 Iron deficiency anemia, unspecified: Secondary | ICD-10-CM | POA: Diagnosis not present

## 2016-04-04 DIAGNOSIS — N2581 Secondary hyperparathyroidism of renal origin: Secondary | ICD-10-CM | POA: Diagnosis not present

## 2016-04-04 DIAGNOSIS — N186 End stage renal disease: Secondary | ICD-10-CM | POA: Diagnosis not present

## 2016-04-04 DIAGNOSIS — E119 Type 2 diabetes mellitus without complications: Secondary | ICD-10-CM | POA: Diagnosis not present

## 2016-04-06 DIAGNOSIS — D631 Anemia in chronic kidney disease: Secondary | ICD-10-CM | POA: Diagnosis not present

## 2016-04-06 DIAGNOSIS — N2581 Secondary hyperparathyroidism of renal origin: Secondary | ICD-10-CM | POA: Diagnosis not present

## 2016-04-06 DIAGNOSIS — D509 Iron deficiency anemia, unspecified: Secondary | ICD-10-CM | POA: Diagnosis not present

## 2016-04-06 DIAGNOSIS — E119 Type 2 diabetes mellitus without complications: Secondary | ICD-10-CM | POA: Diagnosis not present

## 2016-04-06 DIAGNOSIS — N186 End stage renal disease: Secondary | ICD-10-CM | POA: Diagnosis not present

## 2016-04-08 ENCOUNTER — Ambulatory Visit (HOSPITAL_BASED_OUTPATIENT_CLINIC_OR_DEPARTMENT_OTHER): Payer: Medicare Other

## 2016-04-08 VITALS — BP 120/77 | HR 71 | Temp 98.3°F | Resp 18

## 2016-04-08 DIAGNOSIS — Z452 Encounter for adjustment and management of vascular access device: Secondary | ICD-10-CM | POA: Diagnosis not present

## 2016-04-08 DIAGNOSIS — C8338 Diffuse large B-cell lymphoma, lymph nodes of multiple sites: Secondary | ICD-10-CM

## 2016-04-08 DIAGNOSIS — Z95828 Presence of other vascular implants and grafts: Secondary | ICD-10-CM

## 2016-04-08 MED ORDER — HEPARIN SOD (PORK) LOCK FLUSH 100 UNIT/ML IV SOLN
500.0000 [IU] | Freq: Once | INTRAVENOUS | Status: AC | PRN
  Administered 2016-04-08: 500 [IU] via INTRAVENOUS
  Filled 2016-04-08: qty 5

## 2016-04-08 MED ORDER — SODIUM CHLORIDE 0.9 % IJ SOLN
10.0000 mL | INTRAMUSCULAR | Status: DC | PRN
Start: 2016-04-08 — End: 2016-04-08
  Administered 2016-04-08: 10 mL via INTRAVENOUS
  Filled 2016-04-08: qty 10

## 2016-04-08 NOTE — Patient Instructions (Signed)
Implanted Port Home Guide An implanted port is a type of central line that is placed under the skin. Central lines are used to provide IV access when treatment or nutrition needs to be given through a person's veins. Implanted ports are used for long-term IV access. An implanted port may be placed because:  You need IV medicine that would be irritating to the small veins in your hands or arms.  You need long-term IV medicines, such as antibiotics.  You need IV nutrition for a long period.  You need frequent blood draws for lab tests.  You need dialysis.  Implanted ports are usually placed in the chest area, but they can also be placed in the upper arm, the abdomen, or the leg. An implanted port has two main parts:  Reservoir. The reservoir is round and will appear as a small, raised area under your skin. The reservoir is the part where a needle is inserted to give medicines or draw blood.  Catheter. The catheter is a thin, flexible tube that extends from the reservoir. The catheter is placed into a large vein. Medicine that is inserted into the reservoir goes into the catheter and then into the vein.  How will I care for my incision site? Do not get the incision site wet. Bathe or shower as directed by your health care provider. How is my port accessed? Special steps must be taken to access the port:  Before the port is accessed, a numbing cream can be placed on the skin. This helps numb the skin over the port site.  Your health care provider uses a sterile technique to access the port. ? Your health care provider must put on a mask and sterile gloves. ? The skin over your port is cleaned carefully with an antiseptic and allowed to dry. ? The port is gently pinched between sterile gloves, and a needle is inserted into the port.  Only "non-coring" port needles should be used to access the port. Once the port is accessed, a blood return should be checked. This helps ensure that the port  is in the vein and is not clogged.  If your port needs to remain accessed for a constant infusion, a clear (transparent) bandage will be placed over the needle site. The bandage and needle will need to be changed every week, or as directed by your health care provider.  Keep the bandage covering the needle clean and dry. Do not get it wet. Follow your health care provider's instructions on how to take a shower or bath while the port is accessed.  If your port does not need to stay accessed, no bandage is needed over the port.  What is flushing? Flushing helps keep the port from getting clogged. Follow your health care provider's instructions on how and when to flush the port. Ports are usually flushed with saline solution or a medicine called heparin. The need for flushing will depend on how the port is used.  If the port is used for intermittent medicines or blood draws, the port will need to be flushed: ? After medicines have been given. ? After blood has been drawn. ? As part of routine maintenance.  If a constant infusion is running, the port may not need to be flushed.  How long will my port stay implanted? The port can stay in for as long as your health care provider thinks it is needed. When it is time for the port to come out, surgery will be   done to remove it. The procedure is similar to the one performed when the port was put in. When should I seek immediate medical care? When you have an implanted port, you should seek immediate medical care if:  You notice a bad smell coming from the incision site.  You have swelling, redness, or drainage at the incision site.  You have more swelling or pain at the port site or the surrounding area.  You have a fever that is not controlled with medicine.  This information is not intended to replace advice given to you by your health care provider. Make sure you discuss any questions you have with your health care provider. Document  Released: 01/14/2005 Document Revised: 06/22/2015 Document Reviewed: 09/21/2012 Elsevier Interactive Patient Education  2017 [employer]  

## 2016-04-09 DIAGNOSIS — D631 Anemia in chronic kidney disease: Secondary | ICD-10-CM | POA: Diagnosis not present

## 2016-04-09 DIAGNOSIS — D509 Iron deficiency anemia, unspecified: Secondary | ICD-10-CM | POA: Diagnosis not present

## 2016-04-09 DIAGNOSIS — N2581 Secondary hyperparathyroidism of renal origin: Secondary | ICD-10-CM | POA: Diagnosis not present

## 2016-04-09 DIAGNOSIS — N186 End stage renal disease: Secondary | ICD-10-CM | POA: Diagnosis not present

## 2016-04-09 DIAGNOSIS — E119 Type 2 diabetes mellitus without complications: Secondary | ICD-10-CM | POA: Diagnosis not present

## 2016-04-11 DIAGNOSIS — D631 Anemia in chronic kidney disease: Secondary | ICD-10-CM | POA: Diagnosis not present

## 2016-04-11 DIAGNOSIS — N2581 Secondary hyperparathyroidism of renal origin: Secondary | ICD-10-CM | POA: Diagnosis not present

## 2016-04-11 DIAGNOSIS — N186 End stage renal disease: Secondary | ICD-10-CM | POA: Diagnosis not present

## 2016-04-11 DIAGNOSIS — D509 Iron deficiency anemia, unspecified: Secondary | ICD-10-CM | POA: Diagnosis not present

## 2016-04-11 DIAGNOSIS — E119 Type 2 diabetes mellitus without complications: Secondary | ICD-10-CM | POA: Diagnosis not present

## 2016-04-13 DIAGNOSIS — D509 Iron deficiency anemia, unspecified: Secondary | ICD-10-CM | POA: Diagnosis not present

## 2016-04-13 DIAGNOSIS — N186 End stage renal disease: Secondary | ICD-10-CM | POA: Diagnosis not present

## 2016-04-13 DIAGNOSIS — E119 Type 2 diabetes mellitus without complications: Secondary | ICD-10-CM | POA: Diagnosis not present

## 2016-04-13 DIAGNOSIS — N2581 Secondary hyperparathyroidism of renal origin: Secondary | ICD-10-CM | POA: Diagnosis not present

## 2016-04-13 DIAGNOSIS — D631 Anemia in chronic kidney disease: Secondary | ICD-10-CM | POA: Diagnosis not present

## 2016-04-16 DIAGNOSIS — E119 Type 2 diabetes mellitus without complications: Secondary | ICD-10-CM | POA: Diagnosis not present

## 2016-04-16 DIAGNOSIS — N2581 Secondary hyperparathyroidism of renal origin: Secondary | ICD-10-CM | POA: Diagnosis not present

## 2016-04-16 DIAGNOSIS — D631 Anemia in chronic kidney disease: Secondary | ICD-10-CM | POA: Diagnosis not present

## 2016-04-16 DIAGNOSIS — D509 Iron deficiency anemia, unspecified: Secondary | ICD-10-CM | POA: Diagnosis not present

## 2016-04-16 DIAGNOSIS — N186 End stage renal disease: Secondary | ICD-10-CM | POA: Diagnosis not present

## 2016-04-18 DIAGNOSIS — E119 Type 2 diabetes mellitus without complications: Secondary | ICD-10-CM | POA: Diagnosis not present

## 2016-04-18 DIAGNOSIS — N2581 Secondary hyperparathyroidism of renal origin: Secondary | ICD-10-CM | POA: Diagnosis not present

## 2016-04-18 DIAGNOSIS — N186 End stage renal disease: Secondary | ICD-10-CM | POA: Diagnosis not present

## 2016-04-18 DIAGNOSIS — D631 Anemia in chronic kidney disease: Secondary | ICD-10-CM | POA: Diagnosis not present

## 2016-04-18 DIAGNOSIS — D509 Iron deficiency anemia, unspecified: Secondary | ICD-10-CM | POA: Diagnosis not present

## 2016-04-20 DIAGNOSIS — D509 Iron deficiency anemia, unspecified: Secondary | ICD-10-CM | POA: Diagnosis not present

## 2016-04-20 DIAGNOSIS — N2581 Secondary hyperparathyroidism of renal origin: Secondary | ICD-10-CM | POA: Diagnosis not present

## 2016-04-20 DIAGNOSIS — N186 End stage renal disease: Secondary | ICD-10-CM | POA: Diagnosis not present

## 2016-04-20 DIAGNOSIS — E119 Type 2 diabetes mellitus without complications: Secondary | ICD-10-CM | POA: Diagnosis not present

## 2016-04-20 DIAGNOSIS — D631 Anemia in chronic kidney disease: Secondary | ICD-10-CM | POA: Diagnosis not present

## 2016-04-23 DIAGNOSIS — D631 Anemia in chronic kidney disease: Secondary | ICD-10-CM | POA: Diagnosis not present

## 2016-04-23 DIAGNOSIS — E119 Type 2 diabetes mellitus without complications: Secondary | ICD-10-CM | POA: Diagnosis not present

## 2016-04-23 DIAGNOSIS — N186 End stage renal disease: Secondary | ICD-10-CM | POA: Diagnosis not present

## 2016-04-23 DIAGNOSIS — D509 Iron deficiency anemia, unspecified: Secondary | ICD-10-CM | POA: Diagnosis not present

## 2016-04-23 DIAGNOSIS — N2581 Secondary hyperparathyroidism of renal origin: Secondary | ICD-10-CM | POA: Diagnosis not present

## 2016-04-25 DIAGNOSIS — N186 End stage renal disease: Secondary | ICD-10-CM | POA: Diagnosis not present

## 2016-04-25 DIAGNOSIS — D631 Anemia in chronic kidney disease: Secondary | ICD-10-CM | POA: Diagnosis not present

## 2016-04-25 DIAGNOSIS — N2581 Secondary hyperparathyroidism of renal origin: Secondary | ICD-10-CM | POA: Diagnosis not present

## 2016-04-25 DIAGNOSIS — E119 Type 2 diabetes mellitus without complications: Secondary | ICD-10-CM | POA: Diagnosis not present

## 2016-04-25 DIAGNOSIS — D509 Iron deficiency anemia, unspecified: Secondary | ICD-10-CM | POA: Diagnosis not present

## 2016-04-27 DIAGNOSIS — E119 Type 2 diabetes mellitus without complications: Secondary | ICD-10-CM | POA: Diagnosis not present

## 2016-04-27 DIAGNOSIS — N186 End stage renal disease: Secondary | ICD-10-CM | POA: Diagnosis not present

## 2016-04-27 DIAGNOSIS — Z992 Dependence on renal dialysis: Secondary | ICD-10-CM | POA: Diagnosis not present

## 2016-04-27 DIAGNOSIS — D631 Anemia in chronic kidney disease: Secondary | ICD-10-CM | POA: Diagnosis not present

## 2016-04-27 DIAGNOSIS — I129 Hypertensive chronic kidney disease with stage 1 through stage 4 chronic kidney disease, or unspecified chronic kidney disease: Secondary | ICD-10-CM | POA: Diagnosis not present

## 2016-04-27 DIAGNOSIS — D509 Iron deficiency anemia, unspecified: Secondary | ICD-10-CM | POA: Diagnosis not present

## 2016-04-27 DIAGNOSIS — N2581 Secondary hyperparathyroidism of renal origin: Secondary | ICD-10-CM | POA: Diagnosis not present

## 2016-04-30 DIAGNOSIS — E119 Type 2 diabetes mellitus without complications: Secondary | ICD-10-CM | POA: Diagnosis not present

## 2016-04-30 DIAGNOSIS — D631 Anemia in chronic kidney disease: Secondary | ICD-10-CM | POA: Diagnosis not present

## 2016-04-30 DIAGNOSIS — N2581 Secondary hyperparathyroidism of renal origin: Secondary | ICD-10-CM | POA: Diagnosis not present

## 2016-04-30 DIAGNOSIS — D509 Iron deficiency anemia, unspecified: Secondary | ICD-10-CM | POA: Diagnosis not present

## 2016-04-30 DIAGNOSIS — N186 End stage renal disease: Secondary | ICD-10-CM | POA: Diagnosis not present

## 2016-05-02 DIAGNOSIS — N186 End stage renal disease: Secondary | ICD-10-CM | POA: Diagnosis not present

## 2016-05-02 DIAGNOSIS — D509 Iron deficiency anemia, unspecified: Secondary | ICD-10-CM | POA: Diagnosis not present

## 2016-05-02 DIAGNOSIS — E119 Type 2 diabetes mellitus without complications: Secondary | ICD-10-CM | POA: Diagnosis not present

## 2016-05-02 DIAGNOSIS — D631 Anemia in chronic kidney disease: Secondary | ICD-10-CM | POA: Diagnosis not present

## 2016-05-02 DIAGNOSIS — N2581 Secondary hyperparathyroidism of renal origin: Secondary | ICD-10-CM | POA: Diagnosis not present

## 2016-05-04 DIAGNOSIS — D509 Iron deficiency anemia, unspecified: Secondary | ICD-10-CM | POA: Diagnosis not present

## 2016-05-04 DIAGNOSIS — D631 Anemia in chronic kidney disease: Secondary | ICD-10-CM | POA: Diagnosis not present

## 2016-05-04 DIAGNOSIS — E119 Type 2 diabetes mellitus without complications: Secondary | ICD-10-CM | POA: Diagnosis not present

## 2016-05-04 DIAGNOSIS — N186 End stage renal disease: Secondary | ICD-10-CM | POA: Diagnosis not present

## 2016-05-04 DIAGNOSIS — N2581 Secondary hyperparathyroidism of renal origin: Secondary | ICD-10-CM | POA: Diagnosis not present

## 2016-05-07 DIAGNOSIS — N186 End stage renal disease: Secondary | ICD-10-CM | POA: Diagnosis not present

## 2016-05-07 DIAGNOSIS — D509 Iron deficiency anemia, unspecified: Secondary | ICD-10-CM | POA: Diagnosis not present

## 2016-05-07 DIAGNOSIS — N2581 Secondary hyperparathyroidism of renal origin: Secondary | ICD-10-CM | POA: Diagnosis not present

## 2016-05-07 DIAGNOSIS — E119 Type 2 diabetes mellitus without complications: Secondary | ICD-10-CM | POA: Diagnosis not present

## 2016-05-07 DIAGNOSIS — D631 Anemia in chronic kidney disease: Secondary | ICD-10-CM | POA: Diagnosis not present

## 2016-05-08 DIAGNOSIS — L308 Other specified dermatitis: Secondary | ICD-10-CM | POA: Diagnosis not present

## 2016-05-08 DIAGNOSIS — L0109 Other impetigo: Secondary | ICD-10-CM | POA: Diagnosis not present

## 2016-05-08 DIAGNOSIS — L0889 Other specified local infections of the skin and subcutaneous tissue: Secondary | ICD-10-CM | POA: Diagnosis not present

## 2016-05-09 DIAGNOSIS — E119 Type 2 diabetes mellitus without complications: Secondary | ICD-10-CM | POA: Diagnosis not present

## 2016-05-09 DIAGNOSIS — D509 Iron deficiency anemia, unspecified: Secondary | ICD-10-CM | POA: Diagnosis not present

## 2016-05-09 DIAGNOSIS — N2581 Secondary hyperparathyroidism of renal origin: Secondary | ICD-10-CM | POA: Diagnosis not present

## 2016-05-09 DIAGNOSIS — D631 Anemia in chronic kidney disease: Secondary | ICD-10-CM | POA: Diagnosis not present

## 2016-05-09 DIAGNOSIS — N186 End stage renal disease: Secondary | ICD-10-CM | POA: Diagnosis not present

## 2016-05-11 DIAGNOSIS — E119 Type 2 diabetes mellitus without complications: Secondary | ICD-10-CM | POA: Diagnosis not present

## 2016-05-11 DIAGNOSIS — N186 End stage renal disease: Secondary | ICD-10-CM | POA: Diagnosis not present

## 2016-05-11 DIAGNOSIS — N2581 Secondary hyperparathyroidism of renal origin: Secondary | ICD-10-CM | POA: Diagnosis not present

## 2016-05-11 DIAGNOSIS — D631 Anemia in chronic kidney disease: Secondary | ICD-10-CM | POA: Diagnosis not present

## 2016-05-11 DIAGNOSIS — D509 Iron deficiency anemia, unspecified: Secondary | ICD-10-CM | POA: Diagnosis not present

## 2016-05-14 DIAGNOSIS — E119 Type 2 diabetes mellitus without complications: Secondary | ICD-10-CM | POA: Diagnosis not present

## 2016-05-14 DIAGNOSIS — N2581 Secondary hyperparathyroidism of renal origin: Secondary | ICD-10-CM | POA: Diagnosis not present

## 2016-05-14 DIAGNOSIS — D631 Anemia in chronic kidney disease: Secondary | ICD-10-CM | POA: Diagnosis not present

## 2016-05-14 DIAGNOSIS — D509 Iron deficiency anemia, unspecified: Secondary | ICD-10-CM | POA: Diagnosis not present

## 2016-05-14 DIAGNOSIS — N186 End stage renal disease: Secondary | ICD-10-CM | POA: Diagnosis not present

## 2016-05-16 DIAGNOSIS — E119 Type 2 diabetes mellitus without complications: Secondary | ICD-10-CM | POA: Diagnosis not present

## 2016-05-16 DIAGNOSIS — D631 Anemia in chronic kidney disease: Secondary | ICD-10-CM | POA: Diagnosis not present

## 2016-05-16 DIAGNOSIS — D509 Iron deficiency anemia, unspecified: Secondary | ICD-10-CM | POA: Diagnosis not present

## 2016-05-16 DIAGNOSIS — N2581 Secondary hyperparathyroidism of renal origin: Secondary | ICD-10-CM | POA: Diagnosis not present

## 2016-05-16 DIAGNOSIS — N186 End stage renal disease: Secondary | ICD-10-CM | POA: Diagnosis not present

## 2016-05-18 DIAGNOSIS — N186 End stage renal disease: Secondary | ICD-10-CM | POA: Diagnosis not present

## 2016-05-18 DIAGNOSIS — D631 Anemia in chronic kidney disease: Secondary | ICD-10-CM | POA: Diagnosis not present

## 2016-05-18 DIAGNOSIS — E119 Type 2 diabetes mellitus without complications: Secondary | ICD-10-CM | POA: Diagnosis not present

## 2016-05-18 DIAGNOSIS — N2581 Secondary hyperparathyroidism of renal origin: Secondary | ICD-10-CM | POA: Diagnosis not present

## 2016-05-18 DIAGNOSIS — D509 Iron deficiency anemia, unspecified: Secondary | ICD-10-CM | POA: Diagnosis not present

## 2016-05-21 DIAGNOSIS — E119 Type 2 diabetes mellitus without complications: Secondary | ICD-10-CM | POA: Diagnosis not present

## 2016-05-21 DIAGNOSIS — D509 Iron deficiency anemia, unspecified: Secondary | ICD-10-CM | POA: Diagnosis not present

## 2016-05-21 DIAGNOSIS — N2581 Secondary hyperparathyroidism of renal origin: Secondary | ICD-10-CM | POA: Diagnosis not present

## 2016-05-21 DIAGNOSIS — D631 Anemia in chronic kidney disease: Secondary | ICD-10-CM | POA: Diagnosis not present

## 2016-05-21 DIAGNOSIS — N186 End stage renal disease: Secondary | ICD-10-CM | POA: Diagnosis not present

## 2016-05-23 DIAGNOSIS — D509 Iron deficiency anemia, unspecified: Secondary | ICD-10-CM | POA: Diagnosis not present

## 2016-05-23 DIAGNOSIS — E119 Type 2 diabetes mellitus without complications: Secondary | ICD-10-CM | POA: Diagnosis not present

## 2016-05-23 DIAGNOSIS — N186 End stage renal disease: Secondary | ICD-10-CM | POA: Diagnosis not present

## 2016-05-23 DIAGNOSIS — D631 Anemia in chronic kidney disease: Secondary | ICD-10-CM | POA: Diagnosis not present

## 2016-05-23 DIAGNOSIS — N2581 Secondary hyperparathyroidism of renal origin: Secondary | ICD-10-CM | POA: Diagnosis not present

## 2016-05-24 ENCOUNTER — Other Ambulatory Visit (HOSPITAL_BASED_OUTPATIENT_CLINIC_OR_DEPARTMENT_OTHER): Payer: Medicare Other

## 2016-05-24 ENCOUNTER — Ambulatory Visit (HOSPITAL_BASED_OUTPATIENT_CLINIC_OR_DEPARTMENT_OTHER): Payer: Medicare Other

## 2016-05-24 ENCOUNTER — Ambulatory Visit (HOSPITAL_COMMUNITY)
Admission: RE | Admit: 2016-05-24 | Discharge: 2016-05-24 | Disposition: A | Discharge status: Home / Self Care | Payer: Medicare Other | Source: Ambulatory Visit | Attending: Hematology and Oncology | Admitting: Hematology and Oncology

## 2016-05-24 ENCOUNTER — Telehealth: Payer: Self-pay | Admitting: Hematology and Oncology

## 2016-05-24 DIAGNOSIS — C8338 Diffuse large B-cell lymphoma, lymph nodes of multiple sites: Secondary | ICD-10-CM

## 2016-05-24 DIAGNOSIS — I700 Atherosclerosis of aorta: Secondary | ICD-10-CM | POA: Diagnosis not present

## 2016-05-24 DIAGNOSIS — N289 Disorder of kidney and ureter, unspecified: Secondary | ICD-10-CM | POA: Diagnosis not present

## 2016-05-24 DIAGNOSIS — K4020 Bilateral inguinal hernia, without obstruction or gangrene, not specified as recurrent: Secondary | ICD-10-CM | POA: Diagnosis not present

## 2016-05-24 DIAGNOSIS — Z95828 Presence of other vascular implants and grafts: Secondary | ICD-10-CM

## 2016-05-24 DIAGNOSIS — Q613 Polycystic kidney, unspecified: Secondary | ICD-10-CM | POA: Insufficient documentation

## 2016-05-24 DIAGNOSIS — Z8572 Personal history of non-Hodgkin lymphomas: Secondary | ICD-10-CM | POA: Diagnosis not present

## 2016-05-24 LAB — COMPREHENSIVE METABOLIC PANEL
ALBUMIN: 3.8 g/dL (ref 3.5–5.0)
ALK PHOS: 67 U/L (ref 40–150)
ALT: 15 U/L (ref 0–55)
ANION GAP: 14 meq/L — AB (ref 3–11)
AST: 22 U/L (ref 5–34)
BUN: 37.5 mg/dL — ABNORMAL HIGH (ref 7.0–26.0)
CO2: 26 mEq/L (ref 22–29)
Calcium: 9.9 mg/dL (ref 8.4–10.4)
Chloride: 96 mEq/L — ABNORMAL LOW (ref 98–109)
Creatinine: 7.7 mg/dL (ref 0.7–1.3)
EGFR: 8 mL/min/{1.73_m2} — AB (ref >90)
GLUCOSE: 100 mg/dL (ref 70–140)
POTASSIUM: 4.7 meq/L (ref 3.5–5.1)
SODIUM: 136 meq/L (ref 136–145)
Total Bilirubin: 0.49 mg/dL (ref 0.20–1.20)
Total Protein: 7.7 g/dL (ref 6.4–8.3)

## 2016-05-24 LAB — CBC WITH DIFFERENTIAL/PLATELET
BASO%: 1 % (ref 0.0–2.0)
BASOS ABS: 0.1 10*3/uL (ref 0.0–0.1)
EOS%: 21.8 % — AB (ref 0.0–7.0)
Eosinophils Absolute: 1.5 10*3/uL — ABNORMAL HIGH (ref 0.0–0.5)
HCT: 40.7 % (ref 38.4–49.9)
HEMOGLOBIN: 13.2 g/dL (ref 13.0–17.1)
LYMPH%: 22.1 % (ref 14.0–49.0)
MCH: 33.8 pg — AB (ref 27.2–33.4)
MCHC: 32.4 g/dL (ref 32.0–36.0)
MCV: 104.4 fL — AB (ref 79.3–98.0)
MONO#: 0.6 10*3/uL (ref 0.1–0.9)
MONO%: 9 % (ref 0.0–14.0)
NEUT#: 3.1 10*3/uL (ref 1.5–6.5)
NEUT%: 46.1 % (ref 39.0–75.0)
Platelets: 210 10*3/uL (ref 140–400)
RBC: 3.9 10*6/uL — AB (ref 4.20–5.82)
RDW: 14.8 % — ABNORMAL HIGH (ref 11.0–14.6)
WBC: 6.8 10*3/uL (ref 4.0–10.3)
lymph#: 1.5 10*3/uL (ref 0.9–3.3)
nRBC: 0 % (ref 0–0)

## 2016-05-24 MED ORDER — SODIUM CHLORIDE 0.9 % IJ SOLN
10.0000 mL | INTRAMUSCULAR | Status: DC | PRN
  Administered 2016-05-24: 10 mL via INTRAVENOUS
  Filled 2016-05-24: qty 10

## 2016-05-24 MED ORDER — HEPARIN SOD (PORK) LOCK FLUSH 100 UNIT/ML IV SOLN
INTRAVENOUS | Status: AC
  Filled 2016-05-24: qty 5

## 2016-05-24 MED ORDER — HEPARIN SOD (PORK) LOCK FLUSH 100 UNIT/ML IV SOLN
500.0000 [IU] | Freq: Once | INTRAVENOUS | Status: AC
  Administered 2016-05-24: 500 [IU] via INTRAVENOUS

## 2016-05-24 NOTE — Telephone Encounter (Signed)
2 bottles of contrast and a copy of the instructions was given to the patient, per CT scheduled.

## 2016-05-25 DIAGNOSIS — N2581 Secondary hyperparathyroidism of renal origin: Secondary | ICD-10-CM | POA: Diagnosis not present

## 2016-05-25 DIAGNOSIS — D631 Anemia in chronic kidney disease: Secondary | ICD-10-CM | POA: Diagnosis not present

## 2016-05-25 DIAGNOSIS — D509 Iron deficiency anemia, unspecified: Secondary | ICD-10-CM | POA: Diagnosis not present

## 2016-05-25 DIAGNOSIS — E119 Type 2 diabetes mellitus without complications: Secondary | ICD-10-CM | POA: Diagnosis not present

## 2016-05-25 DIAGNOSIS — N186 End stage renal disease: Secondary | ICD-10-CM | POA: Diagnosis not present

## 2016-05-27 ENCOUNTER — Telehealth: Payer: Self-pay | Admitting: Hematology and Oncology

## 2016-05-27 ENCOUNTER — Ambulatory Visit (HOSPITAL_BASED_OUTPATIENT_CLINIC_OR_DEPARTMENT_OTHER): Payer: Medicare Other | Admitting: Hematology and Oncology

## 2016-05-27 DIAGNOSIS — L231 Allergic contact dermatitis due to adhesives: Secondary | ICD-10-CM | POA: Diagnosis not present

## 2016-05-27 DIAGNOSIS — N186 End stage renal disease: Secondary | ICD-10-CM | POA: Diagnosis not present

## 2016-05-27 DIAGNOSIS — I69351 Hemiplegia and hemiparesis following cerebral infarction affecting right dominant side: Secondary | ICD-10-CM

## 2016-05-27 DIAGNOSIS — C8338 Diffuse large B-cell lymphoma, lymph nodes of multiple sites: Secondary | ICD-10-CM | POA: Diagnosis not present

## 2016-05-27 DIAGNOSIS — Z992 Dependence on renal dialysis: Secondary | ICD-10-CM | POA: Diagnosis not present

## 2016-05-27 DIAGNOSIS — I129 Hypertensive chronic kidney disease with stage 1 through stage 4 chronic kidney disease, or unspecified chronic kidney disease: Secondary | ICD-10-CM | POA: Diagnosis not present

## 2016-05-27 NOTE — Telephone Encounter (Signed)
Appointments scheduled per 05/27/16 los. Patient was given a copy of the AVS report and appointment schedule per 05/27/16 los. °

## 2016-05-28 ENCOUNTER — Encounter: Payer: Self-pay | Admitting: Hematology and Oncology

## 2016-05-28 ENCOUNTER — Telehealth: Payer: Self-pay | Admitting: *Deleted

## 2016-05-28 DIAGNOSIS — N2581 Secondary hyperparathyroidism of renal origin: Secondary | ICD-10-CM | POA: Diagnosis not present

## 2016-05-28 DIAGNOSIS — E119 Type 2 diabetes mellitus without complications: Secondary | ICD-10-CM | POA: Diagnosis not present

## 2016-05-28 DIAGNOSIS — D509 Iron deficiency anemia, unspecified: Secondary | ICD-10-CM | POA: Diagnosis not present

## 2016-05-28 DIAGNOSIS — D631 Anemia in chronic kidney disease: Secondary | ICD-10-CM | POA: Diagnosis not present

## 2016-05-28 DIAGNOSIS — N186 End stage renal disease: Secondary | ICD-10-CM | POA: Diagnosis not present

## 2016-05-28 NOTE — Assessment & Plan Note (Signed)
The patient is currently receiving hemodialysis. The cause of his renal failure is due to polycystic kidney disease. I will defer to his nephrologist for management

## 2016-05-28 NOTE — Progress Notes (Signed)
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)      06/12/2015 Imaging    CT chest showed lymphadenopathy within the low neck and low chest, consistent with active lymphoma      06/14/2015 Procedure    He underwent CT-guided biopsy      06/14/2015 Pathology Results    Biopsy of the retroperitoneal mass confirmed diffuse large B-cell lymphoma      06/21/2015 Imaging    ECHO showed normal EF      06/21/2015 Procedure    He has port placement      06/23/2015 - 10/12/2015 Chemotherapy    He received mini-RCHOP chemo x 6 cycles      08/25/2015 PET scan    Significant interval decrease in size of the bulky retroperitoneal lymphadenopathy suggesting an excellent response to therapy. Residual matted soft tissue density in the retroperitoneum with SUV max of approximately 3.3      11/27/2015 PET scan    Continued improved appearance of the treated lymphoma. Minimal residual matted soft tissue density but no discrete measurable disease and no hypermetabolism to suggest metabolically active tumor.      05/24/2016 Imaging    Unchanged retroperitoneal soft tissue most compatible with history of treated lymphoma. No new or enlarging adenopathy in the chest, abdomen or pelvis. Polycystic renal disease. Bilateral fat containing inguinal hernias. Aortic atherosclerosis.       INTERVAL HISTORY: Please see below for problem oriented charting. He is seen with his wife to review test results His main complaint is related to the skin dermatitis It is getting worse He has seen local dermatologist and was  prescribed topical cream without success of controlling it It has progressed and it involving larger areas It is itchy He denies lymphadenopathy Denies recent infection Appetite is stable, no recent weight loss  REVIEW OF SYSTEMS:   Constitutional: Denies fevers, chills or abnormal weight loss Eyes: Denies blurriness of vision Ears, nose, mouth, throat, and face: Denies mucositis or sore throat Respiratory: Denies cough, dyspnea or wheezes Cardiovascular: Denies palpitation, chest discomfort or lower extremity swelling Gastrointestinal:  Denies nausea, heartburn or change in bowel habits Lymphatics: Denies new lymphadenopathy or easy bruising Neurological:Denies numbness, tingling or new weaknesses Behavioral/Psych: Mood is stable, no new changes  All other systems were reviewed with the patient and are negative.  I have reviewed the past medical history, past surgical history, social history and family history with the patient and they are unchanged from previous note.  ALLERGIES:  is allergic to tape.  MEDICATIONS:  Current Outpatient Prescriptions  Medication Sig Dispense Refill  . aspirin 325 MG tablet Take 1 tablet (325 mg total) by mouth daily. 30 tablet 0  . calcium acetate (PHOSLO) 667 MG capsule Take 1 capsule by mouth 2 (two) times daily.   11  . cloNIDine (CATAPRES) 0.2 MG tablet Take 0.4 mg by mouth 2 (two) times daily.     Marland Kitchen docusate sodium (COLACE) 100 MG capsule Take 1 capsule (100 mg total) by mouth every 12 (twelve) hours. (Patient taking differently: Take 100 mg by mouth every 12 (twelve) hours. ) 60 capsule 0  .  hydrOXYzine (ATARAX/VISTARIL) 10 MG tablet Take 10 mg by mouth daily.    . isosorbide-hydrALAZINE (BIDIL) 20-37.5 MG tablet Take 0.5 tablets by mouth 2 (two) times daily.    Marland Kitchen levETIRAcetam (KEPPRA) 500 MG tablet Take 1 tablet (500 mg total) by mouth 2 (two) times daily. 60 tablet 6  . levothyroxine (SYNTHROID, LEVOTHROID) 25 MCG tablet Take 25 mcg by mouth  daily.   0  . metoprolol (LOPRESSOR) 100 MG tablet Take 150 mg by mouth 2 (two) times daily.     . multivitamin (RENA-VIT) TABS tablet Take 1 tablet by mouth at bedtime. 90 tablet 0  . mupirocin ointment (BACTROBAN) 2 % Apply 1 application topically 3 (three) times daily as needed.    . lidocaine-prilocaine (EMLA) cream Apply to affected area once (Patient not taking: Reported on 05/27/2016) 30 g 3  . ondansetron (ZOFRAN ODT) 8 MG disintegrating tablet Take 1 tablet (8 mg total) by mouth every 8 (eight) hours as needed for nausea or vomiting. (Patient not taking: Reported on 08/04/2015) 12 tablet 0  . oxyCODONE-acetaminophen (PERCOCET/ROXICET) 5-325 MG tablet Take 1 tablet by mouth every 4 (four) hours as needed for moderate pain. (Patient not taking: Reported on 08/04/2015) 90 tablet 0  . pentafluoroprop-tetrafluoroeth (GEBAUERS) AERO Apply 1 application topically as needed (topical anesthesia for hemodialysis). (Patient not taking: Reported on 09/18/2015) 30 mL 0  . prochlorperazine (COMPAZINE) 10 MG tablet Take 1 tablet (10 mg total) by mouth every 6 (six) hours as needed (Nausea or vomiting). (Patient not taking: Reported on 08/04/2015) 30 tablet 6   No current facility-administered medications for this visit.     PHYSICAL EXAMINATION: ECOG PERFORMANCE STATUS: 1 - Symptomatic but completely ambulatory  Vitals:   05/27/16 1003  BP: 139/89  Pulse: 64  Resp: 18  Temp: 98 F (36.7 C)   Filed Weights   05/27/16 1003  Weight: 195 lb 8 oz (88.7 kg)    GENERAL:alert, no distress and comfortable SKIN: Noted significant dermatitis on his left upper extremity.  With permission, a picture was taken EYES: normal, Conjunctiva are pink and non-injected, sclera clear OROPHARYNX:no exudate, no erythema and lips, buccal mucosa, and tongue normal  NECK: supple, thyroid normal size, non-tender, without nodularity LYMPH:  no palpable lymphadenopathy in the cervical, axillary or inguinal LUNGS: clear to  auscultation and percussion with normal breathing effort HEART: regular rate & rhythm and no murmurs and no lower extremity edema ABDOMEN:abdomen soft, non-tender and normal bowel sounds Musculoskeletal:no cyanosis of digits and no clubbing  NEURO: alert & oriented x 3 with fluent speech, with chronic right hemiparesis from prior stroke  LABORATORY DATA:  I have reviewed the data as listed    Component Value Date/Time   NA 136 05/24/2016 0838   K 4.7 05/24/2016 0838   CL 95 (L) 06/21/2015 1345   CO2 26 05/24/2016 0838   GLUCOSE 100 05/24/2016 0838   BUN 37.5 (H) 05/24/2016 0838   CREATININE 7.7 (HH) 05/24/2016 0838   CALCIUM 9.9 05/24/2016 0838   PROT 7.7 05/24/2016 0838   ALBUMIN 3.8 05/24/2016 0838   AST 22 05/24/2016 0838   ALT 15 05/24/2016 0838   ALKPHOS 67 05/24/2016 0838   BILITOT 0.49 05/24/2016 0838   GFRNONAA 8 (L) 06/21/2015 1345   GFRAA 9 (L) 06/21/2015 1345    No results found for: SPEP, UPEP  Lab Results  Component Value Date   WBC 6.8 05/24/2016   NEUTROABS 3.1 05/24/2016   HGB 13.2 05/24/2016   HCT 40.7  05/24/2016   MCV 104.4 (H) 05/24/2016   PLT 210 05/24/2016      Chemistry      Component Value Date/Time   NA 136 05/24/2016 0838   K 4.7 05/24/2016 0838   CL 95 (L) 06/21/2015 1345   CO2 26 05/24/2016 0838   BUN 37.5 (H) 05/24/2016 0838   CREATININE 7.7 (HH) 05/24/2016 0838      Component Value Date/Time   CALCIUM 9.9 05/24/2016 0838   ALKPHOS 67 05/24/2016 0838   AST 22 05/24/2016 0838   ALT 15 05/24/2016 0838   BILITOT 0.49 05/24/2016 0838         RADIOGRAPHIC STUDIES: I have personally reviewed the radiological images as listed and agreed with the findings in the report. Ct Abdomen Pelvis Wo Contrast  Result Date: 05/25/2016 CLINICAL DATA:  Patient with history of B-cell lymphoma. Status post chemotherapy. Follow-up evaluation. EXAM: CT CHEST, ABDOMEN AND PELVIS WITHOUT CONTRAST TECHNIQUE: Multidetector CT imaging of the chest,  abdomen and pelvis was performed following the standard protocol without IV contrast. COMPARISON:  PET-CT 11/27/2015 FINDINGS: CT CHEST FINDINGS Cardiovascular: Right anterior chest wall Port-A-Cath is present with tip terminating in the superior vena cava. New normal heart size. No pericardial effusion. Coronary arterial vascular calcifications. Mediastinum/Nodes: No enlarged axillary, mediastinal or hilar lymphadenopathy. Esophagus is normal in caliber. Small hiatal hernia. Lungs/Pleura: Central airways are patent. Dependent atelectasis within the lower lobes bilaterally. No large area of pulmonary consolidation. No pleural effusion or pneumothorax. Musculoskeletal: Thoracic spine degenerative changes. No aggressive or acute appearing osseous lesions. CT ABDOMEN PELVIS FINDINGS Hepatobiliary: Liver is normal in size and contour. Gallbladder is unremarkable. Pancreas: Unremarkable Spleen: Unremarkable Adrenals/Urinary Tract: The adrenal glands are normal. Findings compatible with polycystic kidney disease bilaterally. Urinary bladder is decompressed. Stomach/Bowel: No abnormal bowel wall thickening or evidence for bowel obstruction. No free fluid or free intraperitoneal air. Normal morphology of the stomach. The appendix is normal. Vascular/Lymphatic: Normal caliber abdominal aorta. Peripheral calcified atherosclerotic plaque. Unchanged 6 mm left periaortic lymph node (image 80; series 2). Similar appearance of the aortocaval soft tissue compatible with history of treated lymphoma. Reproductive: Process unremarkable. Other: Bilateral fat containing inguinal hernias. Musculoskeletal: Lumbar spine degenerative changes. No aggressive or acute appearing osseous lesions. IMPRESSION: Unchanged retroperitoneal soft tissue most compatible with history of treated lymphoma. No new or enlarging adenopathy in the chest, abdomen or pelvis. Polycystic renal disease. Bilateral fat containing inguinal hernias. Aortic  atherosclerosis. Electronically Signed   By: Lovey Newcomer M.D.   On: 05/25/2016 10:45   Ct Chest Wo Contrast  Result Date: 05/25/2016 CLINICAL DATA:  Patient with history of B-cell lymphoma. Status post chemotherapy. Follow-up evaluation. EXAM: CT CHEST, ABDOMEN AND PELVIS WITHOUT CONTRAST TECHNIQUE: Multidetector CT imaging of the chest, abdomen and pelvis was performed following the standard protocol without IV contrast. COMPARISON:  PET-CT 11/27/2015 FINDINGS: CT CHEST FINDINGS Cardiovascular: Right anterior chest wall Port-A-Cath is present with tip terminating in the superior vena cava. New normal heart size. No pericardial effusion. Coronary arterial vascular calcifications. Mediastinum/Nodes: No enlarged axillary, mediastinal or hilar lymphadenopathy. Esophagus is normal in caliber. Small hiatal hernia. Lungs/Pleura: Central airways are patent. Dependent atelectasis within the lower lobes bilaterally. No large area of pulmonary consolidation. No pleural effusion or pneumothorax. Musculoskeletal: Thoracic spine degenerative changes. No aggressive or acute appearing osseous lesions. CT ABDOMEN PELVIS FINDINGS Hepatobiliary: Liver is normal in size and contour. Gallbladder is unremarkable. Pancreas: Unremarkable Spleen: Unremarkable Adrenals/Urinary Tract: The adrenal glands are normal. Findings compatible with polycystic  kidney disease bilaterally. Urinary bladder is decompressed. Stomach/Bowel: No abnormal bowel wall thickening or evidence for bowel obstruction. No free fluid or free intraperitoneal air. Normal morphology of the stomach. The appendix is normal. Vascular/Lymphatic: Normal caliber abdominal aorta. Peripheral calcified atherosclerotic plaque. Unchanged 6 mm left periaortic lymph node (image 80; series 2). Similar appearance of the aortocaval soft tissue compatible with history of treated lymphoma. Reproductive: Process unremarkable. Other: Bilateral fat containing inguinal hernias.  Musculoskeletal: Lumbar spine degenerative changes. No aggressive or acute appearing osseous lesions. IMPRESSION: Unchanged retroperitoneal soft tissue most compatible with history of treated lymphoma. No new or enlarging adenopathy in the chest, abdomen or pelvis. Polycystic renal disease. Bilateral fat containing inguinal hernias. Aortic atherosclerosis. Electronically Signed   By: Lovey Newcomer M.D.   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 I recommend observation moving forward with repeat blood work, history and examination in 3 months and imaging every 6 months. His next CT scan will be due around October 2018 I will get his port flushed every 6 weeks to maintain port patency.  End stage renal failure on dialysis [facility]) The patient is currently receiving hemodialysis. The cause of his renal failure is due to polycystic kidney disease. I will defer to his nephrologist for management  Contact dermatitis, allergic He has significant dermatitis on the left upper extremity that is getting worse over the past 6 months He has significant eosinophilia, confirming an allergic component to things He has been seen by local dermatologist who had prescribed topical steroid cream and it is not helping him Recommend second opinion at [facility] and he agreed to proceed I will hold off giving him prednisone therapy for this In the past, the rash improved while he was on prednisone  Hemiparesis affecting right side as late effect of stroke [facility]) The patient had history of fall in 2017 with subdural hematoma. Since then, he is able to manage at home without further falls. He continues to use cane whenever he  walks.    No orders of the defined types were placed in this encounter.  All questions were answered. The patient knows to call the clinic with any problems, questions or concerns. No barriers to learning was detected. I spent 30 minutes counseling the patient face to face. The total time spent in the appointment was 40 minutes and more than 50% was on counseling and review of test results     Heath Lark, MD 05/28/2016 7:21 AM

## 2016-05-28 NOTE — Assessment & Plan Note (Signed)
The patient had history of fall in 2017 with subdural hematoma. Since then, he is able to manage at home without further falls. He continues to use cane whenever he walks.  

## 2016-05-28 NOTE — Assessment & Plan Note (Signed)
His last PET CT scan from October 2017 show complete response to treatment We reviewed recent imaging study of CT dated 05/24/2016 which show no evidence of disease We reviewed the current guidelines. I recommend observation moving forward with repeat blood work, history and examination in 3 months and imaging every 6 months. His next CT scan will be due around October 2018 I will get his port flushed every 6 weeks to maintain port patency.

## 2016-05-28 NOTE — Telephone Encounter (Signed)
-----   Message from Flo Shanks, RN sent at 05/27/2016  4:59 PM EDT ----- Regarding: FW: dermatology consult at [facility]   ----- Message ----- From: Heath Lark, MD Sent: 05/27/2016  10:19 AM To: Flo Shanks, RN Subject: dermatology consult at [facility]             Please send referral to [facility] dermatology; severe dermatitis

## 2016-05-28 NOTE — Assessment & Plan Note (Signed)
He has significant dermatitis on the left upper extremity that is getting worse over the past 6 months He has significant eosinophilia, confirming an allergic component to things He has been seen by local dermatologist who had prescribed topical steroid cream and it is not helping him Recommend second opinion at [facility] and he agreed to proceed I will hold off giving him prednisone therapy for this In the past, the rash improved while he was on prednisone

## 2016-05-28 NOTE — Telephone Encounter (Signed)
Referral called to [facility]. Appt May 29 @ 1:15. Pt notified

## 2016-05-30 DIAGNOSIS — N186 End stage renal disease: Secondary | ICD-10-CM | POA: Diagnosis not present

## 2016-05-30 DIAGNOSIS — D631 Anemia in chronic kidney disease: Secondary | ICD-10-CM | POA: Diagnosis not present

## 2016-05-30 DIAGNOSIS — E119 Type 2 diabetes mellitus without complications: Secondary | ICD-10-CM | POA: Diagnosis not present

## 2016-05-30 DIAGNOSIS — D509 Iron deficiency anemia, unspecified: Secondary | ICD-10-CM | POA: Diagnosis not present

## 2016-05-30 DIAGNOSIS — N2581 Secondary hyperparathyroidism of renal origin: Secondary | ICD-10-CM | POA: Diagnosis not present

## 2016-05-31 DIAGNOSIS — C8590 Non-Hodgkin lymphoma, unspecified, unspecified site: Secondary | ICD-10-CM | POA: Diagnosis not present

## 2016-05-31 DIAGNOSIS — L409 Psoriasis, unspecified: Secondary | ICD-10-CM | POA: Diagnosis not present

## 2016-05-31 DIAGNOSIS — E039 Hypothyroidism, unspecified: Secondary | ICD-10-CM | POA: Diagnosis not present

## 2016-05-31 DIAGNOSIS — E782 Mixed hyperlipidemia: Secondary | ICD-10-CM | POA: Diagnosis not present

## 2016-05-31 DIAGNOSIS — N186 End stage renal disease: Secondary | ICD-10-CM | POA: Diagnosis not present

## 2016-05-31 DIAGNOSIS — I120 Hypertensive chronic kidney disease with stage 5 chronic kidney disease or end stage renal disease: Secondary | ICD-10-CM | POA: Diagnosis not present

## 2016-05-31 DIAGNOSIS — Z992 Dependence on renal dialysis: Secondary | ICD-10-CM | POA: Diagnosis not present

## 2016-06-01 DIAGNOSIS — E119 Type 2 diabetes mellitus without complications: Secondary | ICD-10-CM | POA: Diagnosis not present

## 2016-06-01 DIAGNOSIS — D509 Iron deficiency anemia, unspecified: Secondary | ICD-10-CM | POA: Diagnosis not present

## 2016-06-01 DIAGNOSIS — N2581 Secondary hyperparathyroidism of renal origin: Secondary | ICD-10-CM | POA: Diagnosis not present

## 2016-06-01 DIAGNOSIS — D631 Anemia in chronic kidney disease: Secondary | ICD-10-CM | POA: Diagnosis not present

## 2016-06-01 DIAGNOSIS — N186 End stage renal disease: Secondary | ICD-10-CM | POA: Diagnosis not present

## 2016-06-04 DIAGNOSIS — D509 Iron deficiency anemia, unspecified: Secondary | ICD-10-CM | POA: Diagnosis not present

## 2016-06-04 DIAGNOSIS — D631 Anemia in chronic kidney disease: Secondary | ICD-10-CM | POA: Diagnosis not present

## 2016-06-04 DIAGNOSIS — N2581 Secondary hyperparathyroidism of renal origin: Secondary | ICD-10-CM | POA: Diagnosis not present

## 2016-06-04 DIAGNOSIS — E119 Type 2 diabetes mellitus without complications: Secondary | ICD-10-CM | POA: Diagnosis not present

## 2016-06-04 DIAGNOSIS — N186 End stage renal disease: Secondary | ICD-10-CM | POA: Diagnosis not present

## 2016-06-04 IMAGING — CT CT ABD-PELV W/O CM
2 of 4 series · 9 of 46 positions shown, 10 images · non-contrast
Comparison: 04/14/2015

CLINICAL DATA: Abdominal pain and bilateral flank pain. Nausea and
vomiting.

EXAM:
CT ABDOMEN AND PELVIS WITHOUT CONTRAST
TECHNIQUE: Multidetector CT imaging of the abdomen and pelvis was performed
following the standard protocol without IV contrast.

[Series 201: routine, idose (2) · axial · 0.90mm/px · z∈[-28,+327]mm · 6 of 90 slices shown, 7 images]
[im 11/90  soft-tissue]
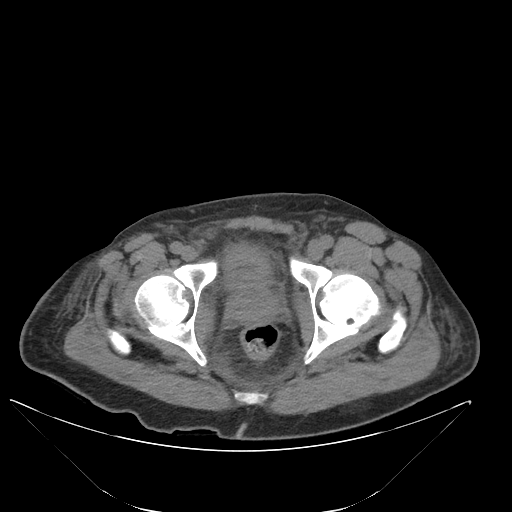
[im 11/90  bone]
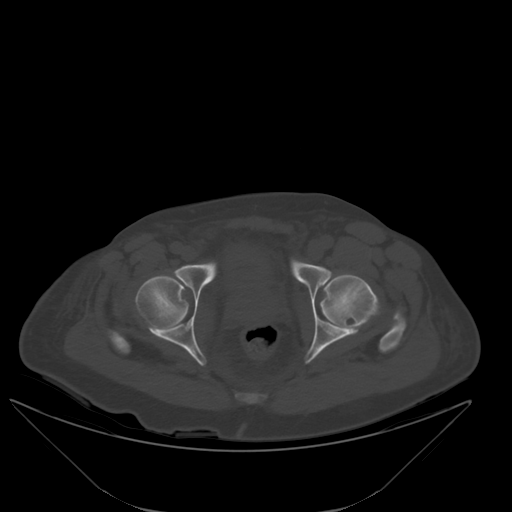
[im 25/90  soft-tissue]
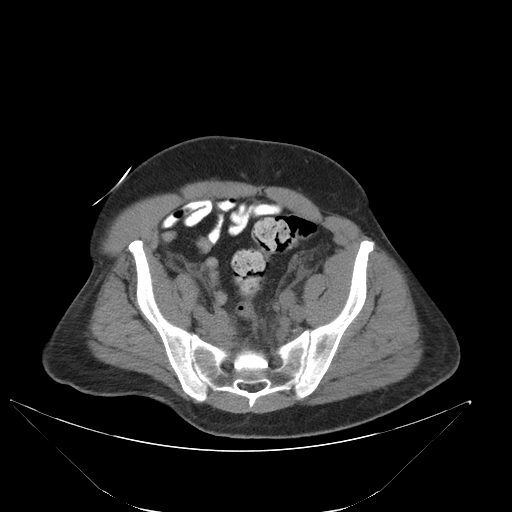
[im 40/90  soft-tissue]
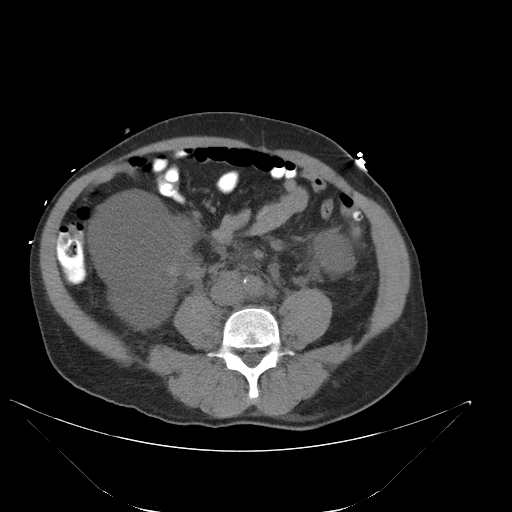
[im 54/90  soft-tissue]
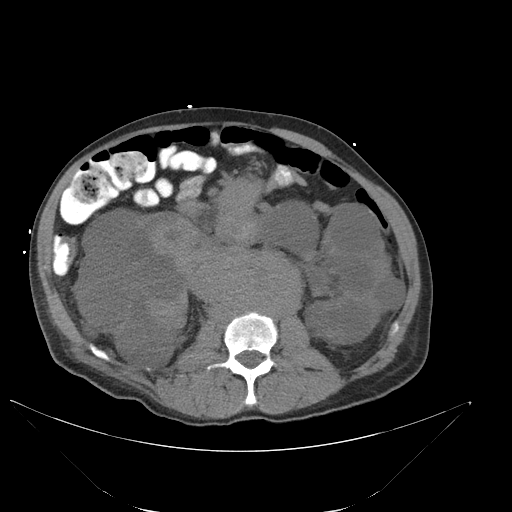
[im 68/90  soft-tissue]
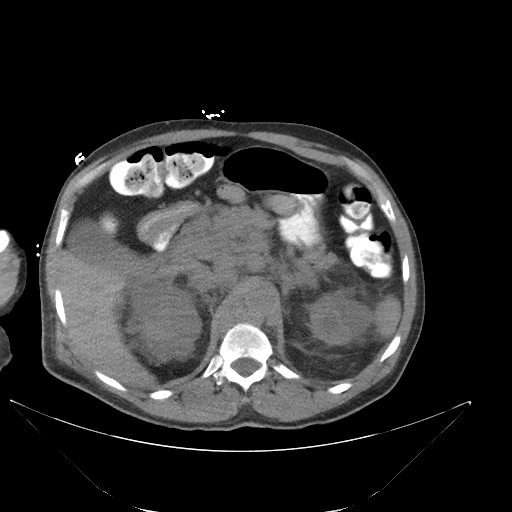
[im 82/90  soft-tissue]
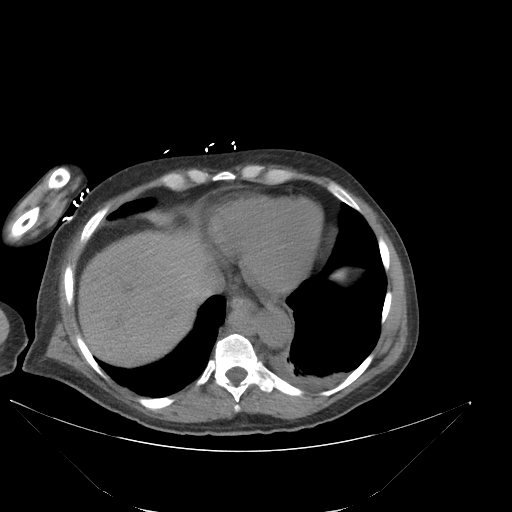

[Series 203: coronals, idose (2) · coronal · 0.45mm/px · 3 of 139 slices shown]
[im 47/139  soft-tissue]
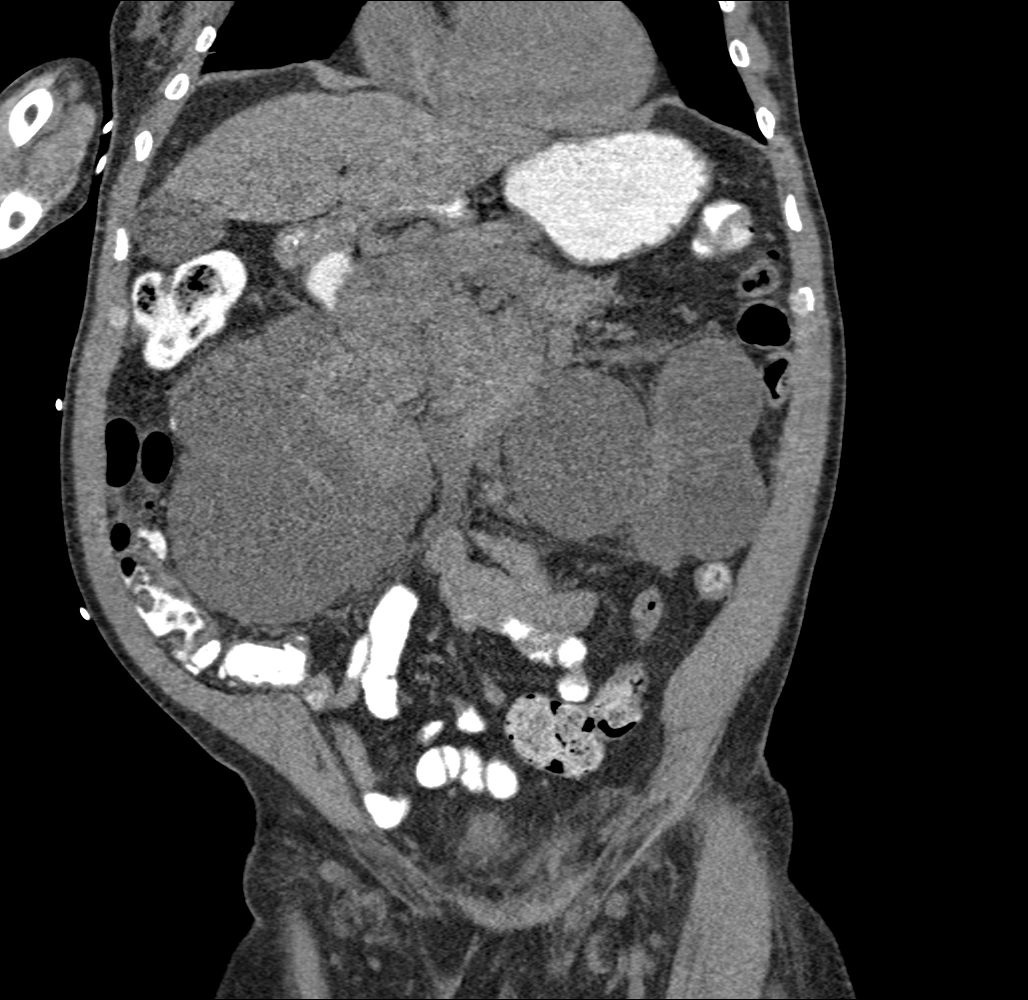
[im 62/139  soft-tissue]
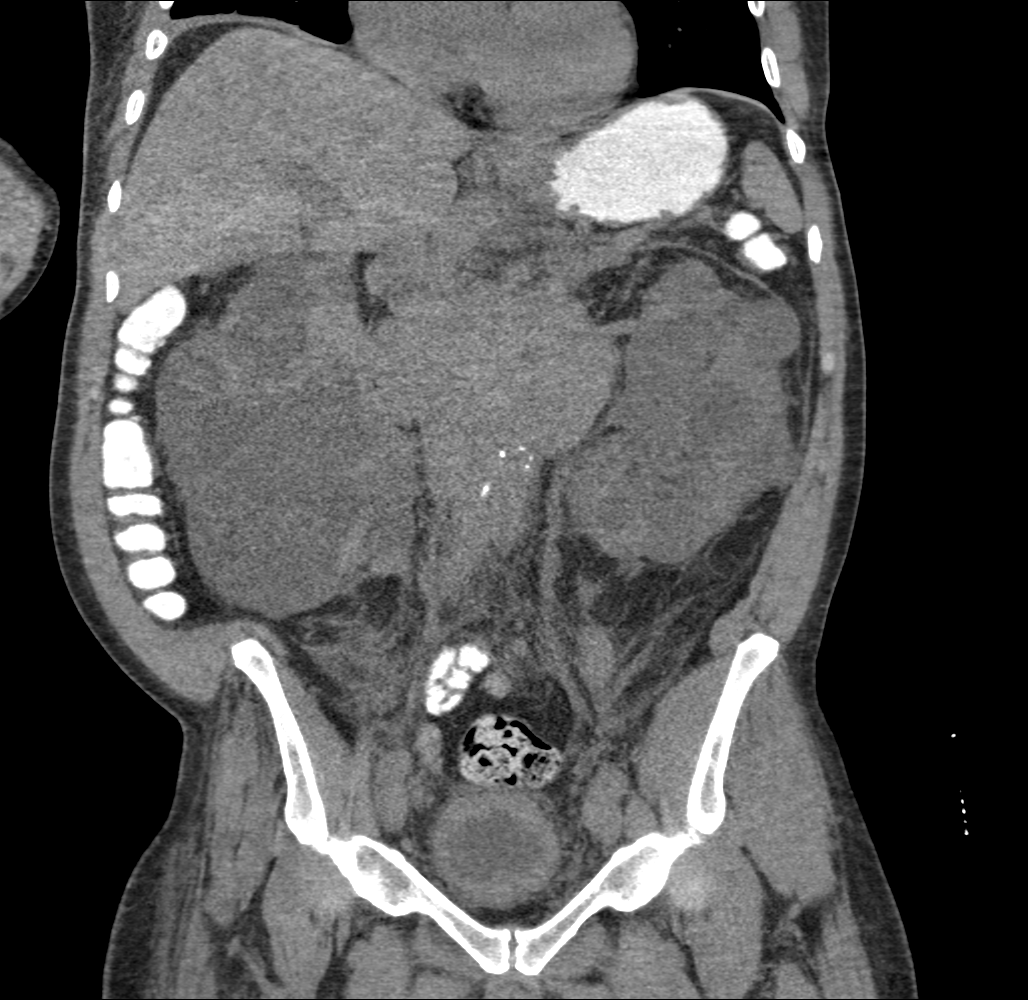
[im 77/139  soft-tissue]
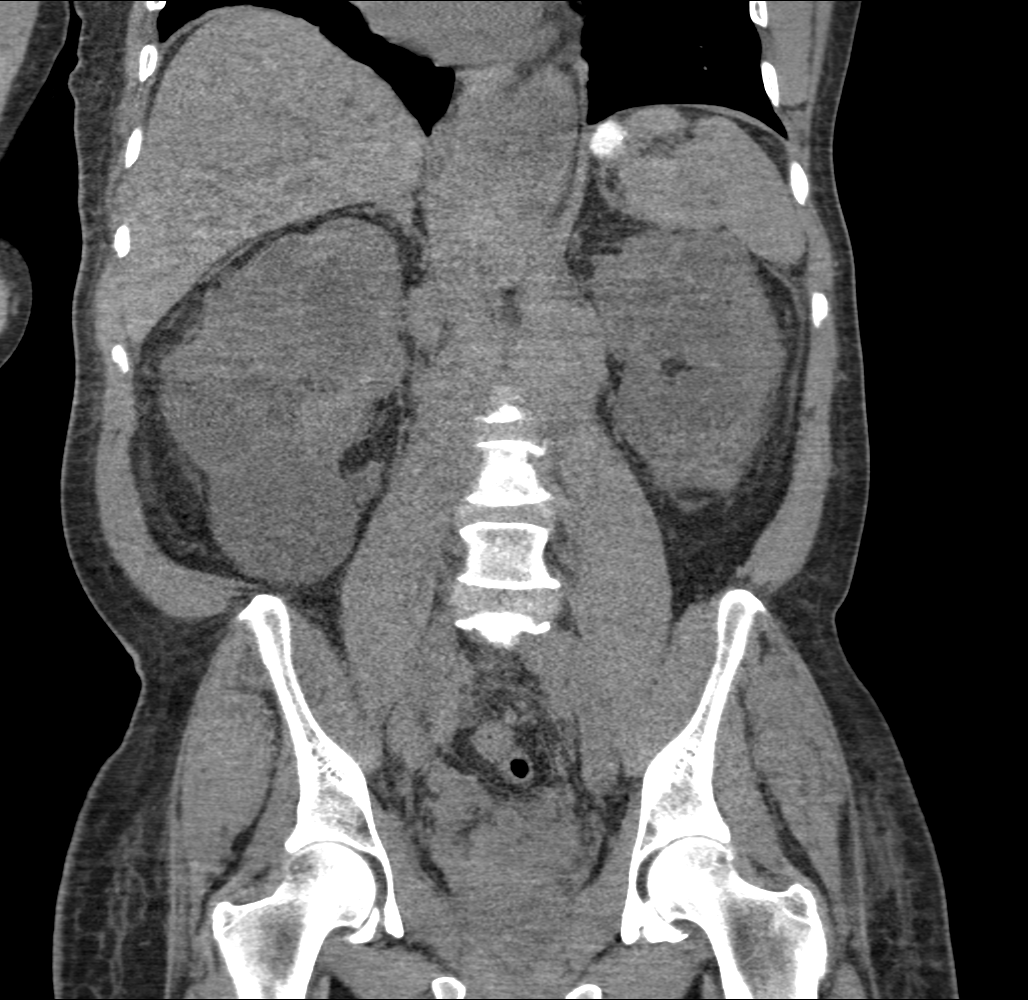

[9 of 46 positions shown; findings below may reference images not displayed]

FINDINGS: Small left pleural effusion. Atelectasis in both lung bases. This is
slightly progressed since previous study.

There is extensive retrocrural and retroperitoneal lymphadenopathy
extending into the iliac chains in the pelvis bilaterally.
Lymphadenopathy also in the celiac axis. Since the previous study,
there has been significant progression of retroperitoneal
lymphadenopathy. This is likely to represent lymphoma with rapid
growth.

Polycystic kidneys bilaterally. No hydronephrosis. The unenhanced
appearance of the liver, spleen, pancreas, and adrenal glands is
unremarkable. Inferior vena cava and aorta are obscured to
visualization by the retroperitoneal lymphadenopathy. Scattered
aortic calcifications are visualized and there is no evidence of
aortic aneurysm. Stomach, small bowel, and colon are not abnormally
distended. Low mid abdominal small bowel loop demonstrates soft
tissue fullness with narrowed barium filled channel likely
representing small bowel lymphoma. No free air or free fluid in the
abdomen.

Pelvis: Appendix is normal. Bladder is decompressed but the bladder
wall is thickened, suggesting possible cystitis or bladder
hypertrophy. Prostate gland is mildly enlarged and again there is
asymmetric enlargement of the right seminal vesicle. No significant
lymphadenopathy in the groin regions. No destructive bone lesions.
IMPRESSION: Extensive retrocrural, retroperitoneal, pelvic, and celiac axis
lymphadenopathy demonstrating significant progression since recent
previous study. Findings consistent with progressing lymphoma. Small
bowel lesion consistent with small bowel lymphoma. Polycystic renal
disease. Prostate gland enlargement with asymmetry of the right
seminal vesicle. Bladder wall thickening could be due to cystitis or
hypertrophy due to outlet obstruction.

These results will be called to the ordering clinician or
representative by the Radiologist Assistant, and communication
documented in the PACS or zVision Dashboard.

## 2016-06-05 DIAGNOSIS — E1351 Other specified diabetes mellitus with diabetic peripheral angiopathy without gangrene: Secondary | ICD-10-CM | POA: Diagnosis not present

## 2016-06-05 DIAGNOSIS — L602 Onychogryphosis: Secondary | ICD-10-CM | POA: Diagnosis not present

## 2016-06-05 DIAGNOSIS — L84 Corns and callosities: Secondary | ICD-10-CM | POA: Diagnosis not present

## 2016-06-06 DIAGNOSIS — D631 Anemia in chronic kidney disease: Secondary | ICD-10-CM | POA: Diagnosis not present

## 2016-06-06 DIAGNOSIS — E119 Type 2 diabetes mellitus without complications: Secondary | ICD-10-CM | POA: Diagnosis not present

## 2016-06-06 DIAGNOSIS — D509 Iron deficiency anemia, unspecified: Secondary | ICD-10-CM | POA: Diagnosis not present

## 2016-06-06 DIAGNOSIS — N2581 Secondary hyperparathyroidism of renal origin: Secondary | ICD-10-CM | POA: Diagnosis not present

## 2016-06-06 DIAGNOSIS — N186 End stage renal disease: Secondary | ICD-10-CM | POA: Diagnosis not present

## 2016-06-08 DIAGNOSIS — D631 Anemia in chronic kidney disease: Secondary | ICD-10-CM | POA: Diagnosis not present

## 2016-06-08 DIAGNOSIS — E119 Type 2 diabetes mellitus without complications: Secondary | ICD-10-CM | POA: Diagnosis not present

## 2016-06-08 DIAGNOSIS — D509 Iron deficiency anemia, unspecified: Secondary | ICD-10-CM | POA: Diagnosis not present

## 2016-06-08 DIAGNOSIS — N2581 Secondary hyperparathyroidism of renal origin: Secondary | ICD-10-CM | POA: Diagnosis not present

## 2016-06-08 DIAGNOSIS — N186 End stage renal disease: Secondary | ICD-10-CM | POA: Diagnosis not present

## 2016-06-11 DIAGNOSIS — N2581 Secondary hyperparathyroidism of renal origin: Secondary | ICD-10-CM | POA: Diagnosis not present

## 2016-06-11 DIAGNOSIS — N186 End stage renal disease: Secondary | ICD-10-CM | POA: Diagnosis not present

## 2016-06-11 DIAGNOSIS — D509 Iron deficiency anemia, unspecified: Secondary | ICD-10-CM | POA: Diagnosis not present

## 2016-06-11 DIAGNOSIS — E119 Type 2 diabetes mellitus without complications: Secondary | ICD-10-CM | POA: Diagnosis not present

## 2016-06-11 DIAGNOSIS — D631 Anemia in chronic kidney disease: Secondary | ICD-10-CM | POA: Diagnosis not present

## 2016-06-13 DIAGNOSIS — N2581 Secondary hyperparathyroidism of renal origin: Secondary | ICD-10-CM | POA: Diagnosis not present

## 2016-06-13 DIAGNOSIS — D631 Anemia in chronic kidney disease: Secondary | ICD-10-CM | POA: Diagnosis not present

## 2016-06-13 DIAGNOSIS — N186 End stage renal disease: Secondary | ICD-10-CM | POA: Diagnosis not present

## 2016-06-13 DIAGNOSIS — E119 Type 2 diabetes mellitus without complications: Secondary | ICD-10-CM | POA: Diagnosis not present

## 2016-06-13 DIAGNOSIS — D509 Iron deficiency anemia, unspecified: Secondary | ICD-10-CM | POA: Diagnosis not present

## 2016-06-15 DIAGNOSIS — N186 End stage renal disease: Secondary | ICD-10-CM | POA: Diagnosis not present

## 2016-06-15 DIAGNOSIS — E119 Type 2 diabetes mellitus without complications: Secondary | ICD-10-CM | POA: Diagnosis not present

## 2016-06-15 DIAGNOSIS — D509 Iron deficiency anemia, unspecified: Secondary | ICD-10-CM | POA: Diagnosis not present

## 2016-06-15 DIAGNOSIS — N2581 Secondary hyperparathyroidism of renal origin: Secondary | ICD-10-CM | POA: Diagnosis not present

## 2016-06-15 DIAGNOSIS — D631 Anemia in chronic kidney disease: Secondary | ICD-10-CM | POA: Diagnosis not present

## 2016-06-18 DIAGNOSIS — D509 Iron deficiency anemia, unspecified: Secondary | ICD-10-CM | POA: Diagnosis not present

## 2016-06-18 DIAGNOSIS — E119 Type 2 diabetes mellitus without complications: Secondary | ICD-10-CM | POA: Diagnosis not present

## 2016-06-18 DIAGNOSIS — N186 End stage renal disease: Secondary | ICD-10-CM | POA: Diagnosis not present

## 2016-06-18 DIAGNOSIS — D631 Anemia in chronic kidney disease: Secondary | ICD-10-CM | POA: Diagnosis not present

## 2016-06-18 DIAGNOSIS — N2581 Secondary hyperparathyroidism of renal origin: Secondary | ICD-10-CM | POA: Diagnosis not present

## 2016-06-20 DIAGNOSIS — E119 Type 2 diabetes mellitus without complications: Secondary | ICD-10-CM | POA: Diagnosis not present

## 2016-06-20 DIAGNOSIS — N2581 Secondary hyperparathyroidism of renal origin: Secondary | ICD-10-CM | POA: Diagnosis not present

## 2016-06-20 DIAGNOSIS — D631 Anemia in chronic kidney disease: Secondary | ICD-10-CM | POA: Diagnosis not present

## 2016-06-20 DIAGNOSIS — D509 Iron deficiency anemia, unspecified: Secondary | ICD-10-CM | POA: Diagnosis not present

## 2016-06-20 DIAGNOSIS — N186 End stage renal disease: Secondary | ICD-10-CM | POA: Diagnosis not present

## 2016-06-22 DIAGNOSIS — D509 Iron deficiency anemia, unspecified: Secondary | ICD-10-CM | POA: Diagnosis not present

## 2016-06-22 DIAGNOSIS — D631 Anemia in chronic kidney disease: Secondary | ICD-10-CM | POA: Diagnosis not present

## 2016-06-22 DIAGNOSIS — E119 Type 2 diabetes mellitus without complications: Secondary | ICD-10-CM | POA: Diagnosis not present

## 2016-06-22 DIAGNOSIS — N186 End stage renal disease: Secondary | ICD-10-CM | POA: Diagnosis not present

## 2016-06-22 DIAGNOSIS — N2581 Secondary hyperparathyroidism of renal origin: Secondary | ICD-10-CM | POA: Diagnosis not present

## 2016-06-25 DIAGNOSIS — L409 Psoriasis, unspecified: Secondary | ICD-10-CM | POA: Diagnosis not present

## 2016-06-25 DIAGNOSIS — D509 Iron deficiency anemia, unspecified: Secondary | ICD-10-CM | POA: Diagnosis not present

## 2016-06-25 DIAGNOSIS — N2581 Secondary hyperparathyroidism of renal origin: Secondary | ICD-10-CM | POA: Diagnosis not present

## 2016-06-25 DIAGNOSIS — D631 Anemia in chronic kidney disease: Secondary | ICD-10-CM | POA: Diagnosis not present

## 2016-06-25 DIAGNOSIS — E119 Type 2 diabetes mellitus without complications: Secondary | ICD-10-CM | POA: Diagnosis not present

## 2016-06-25 DIAGNOSIS — N186 End stage renal disease: Secondary | ICD-10-CM | POA: Diagnosis not present

## 2016-06-27 ENCOUNTER — Other Ambulatory Visit: Payer: Self-pay | Admitting: Hematology and Oncology

## 2016-06-27 DIAGNOSIS — N186 End stage renal disease: Secondary | ICD-10-CM | POA: Diagnosis not present

## 2016-06-27 DIAGNOSIS — Z992 Dependence on renal dialysis: Secondary | ICD-10-CM | POA: Diagnosis not present

## 2016-06-27 DIAGNOSIS — I129 Hypertensive chronic kidney disease with stage 1 through stage 4 chronic kidney disease, or unspecified chronic kidney disease: Secondary | ICD-10-CM | POA: Diagnosis not present

## 2016-06-27 DIAGNOSIS — E119 Type 2 diabetes mellitus without complications: Secondary | ICD-10-CM | POA: Diagnosis not present

## 2016-06-27 DIAGNOSIS — D509 Iron deficiency anemia, unspecified: Secondary | ICD-10-CM | POA: Diagnosis not present

## 2016-06-27 DIAGNOSIS — N2581 Secondary hyperparathyroidism of renal origin: Secondary | ICD-10-CM | POA: Diagnosis not present

## 2016-06-27 DIAGNOSIS — D631 Anemia in chronic kidney disease: Secondary | ICD-10-CM | POA: Diagnosis not present

## 2016-06-29 DIAGNOSIS — N2581 Secondary hyperparathyroidism of renal origin: Secondary | ICD-10-CM | POA: Diagnosis not present

## 2016-06-29 DIAGNOSIS — D631 Anemia in chronic kidney disease: Secondary | ICD-10-CM | POA: Diagnosis not present

## 2016-06-29 DIAGNOSIS — D509 Iron deficiency anemia, unspecified: Secondary | ICD-10-CM | POA: Diagnosis not present

## 2016-06-29 DIAGNOSIS — E119 Type 2 diabetes mellitus without complications: Secondary | ICD-10-CM | POA: Diagnosis not present

## 2016-06-29 DIAGNOSIS — N186 End stage renal disease: Secondary | ICD-10-CM | POA: Diagnosis not present

## 2016-07-02 DIAGNOSIS — N186 End stage renal disease: Secondary | ICD-10-CM | POA: Diagnosis not present

## 2016-07-02 DIAGNOSIS — N2581 Secondary hyperparathyroidism of renal origin: Secondary | ICD-10-CM | POA: Diagnosis not present

## 2016-07-02 DIAGNOSIS — D509 Iron deficiency anemia, unspecified: Secondary | ICD-10-CM | POA: Diagnosis not present

## 2016-07-02 DIAGNOSIS — D631 Anemia in chronic kidney disease: Secondary | ICD-10-CM | POA: Diagnosis not present

## 2016-07-02 DIAGNOSIS — E119 Type 2 diabetes mellitus without complications: Secondary | ICD-10-CM | POA: Diagnosis not present

## 2016-07-04 DIAGNOSIS — D631 Anemia in chronic kidney disease: Secondary | ICD-10-CM | POA: Diagnosis not present

## 2016-07-04 DIAGNOSIS — D509 Iron deficiency anemia, unspecified: Secondary | ICD-10-CM | POA: Diagnosis not present

## 2016-07-04 DIAGNOSIS — N2581 Secondary hyperparathyroidism of renal origin: Secondary | ICD-10-CM | POA: Diagnosis not present

## 2016-07-04 DIAGNOSIS — E119 Type 2 diabetes mellitus without complications: Secondary | ICD-10-CM | POA: Diagnosis not present

## 2016-07-04 DIAGNOSIS — N186 End stage renal disease: Secondary | ICD-10-CM | POA: Diagnosis not present

## 2016-07-06 DIAGNOSIS — D509 Iron deficiency anemia, unspecified: Secondary | ICD-10-CM | POA: Diagnosis not present

## 2016-07-06 DIAGNOSIS — N186 End stage renal disease: Secondary | ICD-10-CM | POA: Diagnosis not present

## 2016-07-06 DIAGNOSIS — E119 Type 2 diabetes mellitus without complications: Secondary | ICD-10-CM | POA: Diagnosis not present

## 2016-07-06 DIAGNOSIS — N2581 Secondary hyperparathyroidism of renal origin: Secondary | ICD-10-CM | POA: Diagnosis not present

## 2016-07-06 DIAGNOSIS — D631 Anemia in chronic kidney disease: Secondary | ICD-10-CM | POA: Diagnosis not present

## 2016-07-08 ENCOUNTER — Other Ambulatory Visit: Payer: Self-pay | Admitting: Hematology and Oncology

## 2016-07-08 ENCOUNTER — Ambulatory Visit (HOSPITAL_BASED_OUTPATIENT_CLINIC_OR_DEPARTMENT_OTHER): Payer: Medicare Other | Admitting: Hematology and Oncology

## 2016-07-08 ENCOUNTER — Ambulatory Visit (HOSPITAL_BASED_OUTPATIENT_CLINIC_OR_DEPARTMENT_OTHER): Payer: Medicare Other

## 2016-07-08 ENCOUNTER — Other Ambulatory Visit (HOSPITAL_BASED_OUTPATIENT_CLINIC_OR_DEPARTMENT_OTHER): Payer: Medicare Other

## 2016-07-08 ENCOUNTER — Encounter: Payer: Self-pay | Admitting: Hematology and Oncology

## 2016-07-08 ENCOUNTER — Telehealth: Payer: Self-pay | Admitting: Hematology and Oncology

## 2016-07-08 DIAGNOSIS — Z992 Dependence on renal dialysis: Secondary | ICD-10-CM

## 2016-07-08 DIAGNOSIS — Q613 Polycystic kidney, unspecified: Secondary | ICD-10-CM

## 2016-07-08 DIAGNOSIS — D631 Anemia in chronic kidney disease: Secondary | ICD-10-CM

## 2016-07-08 DIAGNOSIS — C8338 Diffuse large B-cell lymphoma, lymph nodes of multiple sites: Secondary | ICD-10-CM

## 2016-07-08 DIAGNOSIS — L231 Allergic contact dermatitis due to adhesives: Secondary | ICD-10-CM | POA: Diagnosis not present

## 2016-07-08 DIAGNOSIS — D649 Anemia, unspecified: Secondary | ICD-10-CM

## 2016-07-08 DIAGNOSIS — N186 End stage renal disease: Secondary | ICD-10-CM | POA: Diagnosis not present

## 2016-07-08 DIAGNOSIS — Z95828 Presence of other vascular implants and grafts: Secondary | ICD-10-CM

## 2016-07-08 LAB — COMPREHENSIVE METABOLIC PANEL
ALT: 14 U/L (ref 0–55)
ANION GAP: 11 meq/L (ref 3–11)
AST: 15 U/L (ref 5–34)
Albumin: 3.1 g/dL — ABNORMAL LOW (ref 3.5–5.0)
Alkaline Phosphatase: 62 U/L (ref 40–150)
BUN: 42.7 mg/dL — ABNORMAL HIGH (ref 7.0–26.0)
CHLORIDE: 98 meq/L (ref 98–109)
CO2: 29 meq/L (ref 22–29)
Calcium: 9.6 mg/dL (ref 8.4–10.4)
Creatinine: 8.7 mg/dL (ref 0.7–1.3)
EGFR: 7 mL/min/{1.73_m2} — AB (ref >90)
Glucose: 87 mg/dl (ref 70–140)
POTASSIUM: 4.3 meq/L (ref 3.5–5.1)
Sodium: 138 mEq/L (ref 136–145)
Total Bilirubin: 0.41 mg/dL (ref 0.20–1.20)
Total Protein: 6.7 g/dL (ref 6.4–8.3)

## 2016-07-08 LAB — CBC WITH DIFFERENTIAL/PLATELET
BASO%: 1.1 % (ref 0.0–2.0)
BASOS ABS: 0.1 10*3/uL (ref 0.0–0.1)
EOS ABS: 1.5 10*3/uL — AB (ref 0.0–0.5)
EOS%: 17.1 % — ABNORMAL HIGH (ref 0.0–7.0)
HEMATOCRIT: 32.7 % — AB (ref 38.4–49.9)
HEMOGLOBIN: 11 g/dL — AB (ref 13.0–17.1)
LYMPH#: 1 10*3/uL (ref 0.9–3.3)
LYMPH%: 11.8 % — ABNORMAL LOW (ref 14.0–49.0)
MCH: 34 pg — AB (ref 27.2–33.4)
MCHC: 33.6 g/dL (ref 32.0–36.0)
MCV: 101.4 fL — AB (ref 79.3–98.0)
MONO#: 0.6 10*3/uL (ref 0.1–0.9)
MONO%: 6.7 % (ref 0.0–14.0)
NEUT%: 63.3 % (ref 39.0–75.0)
NEUTROS ABS: 5.5 10*3/uL (ref 1.5–6.5)
Platelets: 295 10*3/uL (ref 140–400)
RBC: 3.22 10*6/uL — ABNORMAL LOW (ref 4.20–5.82)
RDW: 14.9 % — AB (ref 11.0–14.6)
WBC: 8.7 10*3/uL (ref 4.0–10.3)

## 2016-07-08 LAB — LACTATE DEHYDROGENASE: LDH: 150 U/L (ref 125–245)

## 2016-07-08 MED ORDER — SODIUM CHLORIDE 0.9 % IJ SOLN
10.0000 mL | INTRAMUSCULAR | Status: DC | PRN
  Administered 2016-07-08: 10 mL via INTRAVENOUS
  Filled 2016-07-08: qty 10

## 2016-07-08 MED ORDER — HEPARIN SOD (PORK) LOCK FLUSH 100 UNIT/ML IV SOLN
500.0000 [IU] | Freq: Once | INTRAVENOUS | Status: AC | PRN
  Administered 2016-07-08: 500 [IU] via INTRAVENOUS
  Filled 2016-07-08: qty 5

## 2016-07-08 NOTE — Progress Notes (Signed)
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[place])      06/12/2015 Imaging    CT chest showed lymphadenopathy within the low neck and low chest, consistent with active lymphoma      06/14/2015 Procedure    He underwent CT-guided biopsy      06/14/2015 Pathology Results    Biopsy of the retroperitoneal mass confirmed diffuse large B-cell lymphoma      06/21/2015 Imaging    ECHO showed normal EF      06/21/2015 Procedure    He has port placement      06/23/2015 - 10/12/2015 Chemotherapy    He received mini-RCHOP chemo x 6 cycles      08/25/2015 PET scan    Significant interval decrease in size of the bulky retroperitoneal lymphadenopathy suggesting an excellent response to therapy. Residual matted soft tissue density in the retroperitoneum with SUV max of approximately 3.3      11/27/2015 PET scan    Continued improved appearance of the treated lymphoma. Minimal residual matted soft tissue density but no discrete measurable disease and no hypermetabolism to suggest metabolically active tumor.      05/24/2016 Imaging    Unchanged retroperitoneal soft tissue most compatible with history of treated lymphoma. No new or enlarging adenopathy in the chest, abdomen or pelvis. Polycystic renal disease. Bilateral fat containing inguinal hernias. Aortic atherosclerosis.       INTERVAL HISTORY: Please see below for problem oriented charting. He is seen today with his wife He is doing better He was referred to see a dermatologist at [facility] who prescribed some cream for his skin rash Since then,  his rash is improving He denies recent infection He denies new lymphadenopathy  REVIEW OF SYSTEMS:   Constitutional: Denies fevers, chills or abnormal weight loss Eyes: Denies blurriness of vision Ears, nose, mouth, throat, and face: Denies mucositis or sore throat Respiratory: Denies cough, dyspnea or wheezes Cardiovascular: Denies palpitation, chest discomfort or lower extremity swelling Gastrointestinal:  Denies nausea, heartburn or change in bowel habits Lymphatics: Denies new lymphadenopathy or easy bruising Neurological:Denies numbness, tingling or new weaknesses Behavioral/Psych: Mood is stable, no new changes  All other systems were reviewed with the patient and are negative.  I have reviewed the past medical history, past surgical history, social history and family history with the patient and they are unchanged from previous note.  ALLERGIES:  is allergic to tape.  MEDICATIONS:  Current Outpatient Prescriptions  Medication Sig Dispense Refill  . aspirin 325 MG tablet Take 1 tablet (325 mg total) by mouth daily. 30 tablet 0  . atorvastatin (LIPITOR) 10 MG tablet Take 10 mg by mouth daily.  0  . calcium acetate (PHOSLO) 667 MG capsule Take 1 capsule by mouth 2 (two) times daily.   11  . clobetasol ointment (TEMOVATE) 0.63 % Apply 1 application topically 2 (two) times daily.    . cloNIDine (CATAPRES) 0.2 MG tablet Take 0.4 mg by mouth 2 (two) times daily.     Marland Kitchen docusate sodium (COLACE) 100 MG capsule Take 1 capsule (100 mg total) by mouth every 12 (twelve) hours. (Patient taking differently: Take  100 mg by mouth every 12 (twelve) hours. ) 60 capsule 0  . hydrOXYzine (ATARAX/VISTARIL) 10 MG tablet Take 10 mg by mouth daily.    . isosorbide-hydrALAZINE (BIDIL) 20-37.5 MG tablet Take 0.5 tablets by mouth 2 (two) times daily.    Marland Kitchen levETIRAcetam (KEPPRA) 500 MG tablet Take 1 tablet (500 mg total) by mouth 2 (two) times daily. 60 tablet 6  . levothyroxine (SYNTHROID, LEVOTHROID) 25  MCG tablet Take 25 mcg by mouth daily.   0  . lidocaine-prilocaine (EMLA) cream Apply to affected area once 30 g 3  . metoprolol (LOPRESSOR) 100 MG tablet Take 150 mg by mouth 2 (two) times daily.     . multivitamin (RENA-VIT) TABS tablet Take 1 tablet by mouth at bedtime. 90 tablet 0   No current facility-administered medications for this visit.     PHYSICAL EXAMINATION: ECOG PERFORMANCE STATUS: 0 - Asymptomatic  Vitals:   07/08/16 1117  BP: 105/76  Pulse: (!) 58  Resp: 17  Temp: 97.5 F (36.4 C)   Filed Weights   07/08/16 1117  Weight: 192 lb (87.1 kg)    GENERAL:alert, no distress and comfortable SKIN: His skin rash is improving EYES: normal, Conjunctiva are pink and non-injected, sclera clear OROPHARYNX:no exudate, no erythema and lips, buccal mucosa, and tongue normal  NECK: supple, thyroid normal size, non-tender, without nodularity LYMPH:  no palpable lymphadenopathy in the cervical, axillary or inguinal LUNGS: clear to auscultation and percussion with normal breathing effort HEART: regular rate & rhythm and no murmurs and no lower extremity edema ABDOMEN:abdomen soft, non-tender and normal bowel sounds Musculoskeletal:no cyanosis of digits and no clubbing  NEURO: alert & oriented x 3 with fluent speech, no focal motor/sensory deficits  LABORATORY DATA:  I have reviewed the data as listed    Component Value Date/Time   NA 138 07/08/2016 1052   K 4.3 07/08/2016 1052   CL 95 (L) 06/21/2015 1345   CO2 29 07/08/2016 1052   GLUCOSE 87 07/08/2016 1052   BUN 42.7 (H) 07/08/2016 1052   CREATININE 8.7 (HH) 07/08/2016 1052   CALCIUM 9.6 07/08/2016 1052   PROT 6.7 07/08/2016 1052   ALBUMIN 3.1 (L) 07/08/2016 1052   AST 15 07/08/2016 1052   ALT 14 07/08/2016 1052   ALKPHOS 62 07/08/2016 1052   BILITOT 0.41 07/08/2016 1052   GFRNONAA 8 (L) 06/21/2015 1345   GFRAA 9 (L) 06/21/2015 1345    No results found for: SPEP, UPEP  Lab Results  Component Value Date   WBC  8.7 07/08/2016   NEUTROABS 5.5 07/08/2016   HGB 11.0 (L) 07/08/2016   HCT 32.7 (L) 07/08/2016   MCV 101.4 (H) 07/08/2016   PLT 295 07/08/2016      Chemistry      Component Value Date/Time   NA 138 07/08/2016 1052   K 4.3 07/08/2016 1052   CL 95 (L) 06/21/2015 1345   CO2 29 07/08/2016 1052   BUN 42.7 (H) 07/08/2016 1052   CREATININE 8.7 (HH) 07/08/2016 1052      Component Value Date/Time   CALCIUM 9.6 07/08/2016 1052   ALKPHOS 62 07/08/2016 1052   AST 15 07/08/2016 1052   ALT 14 07/08/2016 1052   BILITOT 0.41 07/08/2016 1052     ASSESSMENT & PLAN:  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 I recommend observation moving forward with repeat blood work, history and examination in 3 months and imaging every 6 months.  His next CT scan will be due around October 2018 I will get his port flushed every 6 weeks to maintain port patency.  Contact dermatitis, allergic The patient was referred to see dermatologist and was prescribed special ointment His rash is improving dramatically in eosinophil count is improved I would defer to his dermatologist for further management  Anemia in chronic kidney disease This is likely anemia of chronic disease. The patient denies recent history of bleeding such as epistaxis, hematuria or hematochezia. He is asymptomatic from the anemia. We will observe for now.  He does not require transfusion now.  I would defer ESA management to nephrologist.  End stage renal failure on dialysis [facility]) The patient is currently receiving hemodialysis. The cause of his renal failure is due to polycystic kidney disease. I will defer to his nephrologist for management   No orders of the defined types were placed in this encounter.  All  questions were answered. The patient knows to call the clinic with any problems, questions or concerns. No barriers to learning was detected. I spent 10 minutes counseling the patient face to face. The total time spent in the appointment was 15 minutes and more than 50% was on counseling and review of test results     Heath Lark, MD 07/08/2016 12:46 PM

## 2016-07-08 NOTE — Assessment & Plan Note (Signed)
The patient is currently receiving hemodialysis. The cause of his renal failure is due to polycystic kidney disease. I will defer to his nephrologist for management

## 2016-07-08 NOTE — Telephone Encounter (Signed)
Scheduled appt per 6/11 los - Gave patient AVS and calender per los - lab and f/u 9/10.

## 2016-07-08 NOTE — Assessment & Plan Note (Signed)
The patient was referred to see dermatologist and was prescribed special ointment His rash is improving dramatically in eosinophil count is improved I would defer to his dermatologist for further management

## 2016-07-08 NOTE — Patient Instructions (Signed)

## 2016-07-08 NOTE — Assessment & Plan Note (Signed)
His last PET CT scan from October 2017 show complete response to treatment We reviewed recent imaging study of CT dated 05/24/2016 which show no evidence of disease We reviewed the current guidelines. I recommend observation moving forward with repeat blood work, history and examination in 3 months and imaging every 6 months.  His next CT scan will be due around October 2018 I will get his port flushed every 6 weeks to maintain port patency.

## 2016-07-08 NOTE — Assessment & Plan Note (Signed)
This is likely anemia of chronic disease. The patient denies recent history of bleeding such as epistaxis, hematuria or hematochezia. He is asymptomatic from the anemia. We will observe for now.  He does not require transfusion now.  I would defer ESA management to nephrologist.

## 2016-07-09 DIAGNOSIS — D509 Iron deficiency anemia, unspecified: Secondary | ICD-10-CM | POA: Diagnosis not present

## 2016-07-09 DIAGNOSIS — N2581 Secondary hyperparathyroidism of renal origin: Secondary | ICD-10-CM | POA: Diagnosis not present

## 2016-07-09 DIAGNOSIS — N186 End stage renal disease: Secondary | ICD-10-CM | POA: Diagnosis not present

## 2016-07-09 DIAGNOSIS — E119 Type 2 diabetes mellitus without complications: Secondary | ICD-10-CM | POA: Diagnosis not present

## 2016-07-09 DIAGNOSIS — D631 Anemia in chronic kidney disease: Secondary | ICD-10-CM | POA: Diagnosis not present

## 2016-07-11 DIAGNOSIS — E119 Type 2 diabetes mellitus without complications: Secondary | ICD-10-CM | POA: Diagnosis not present

## 2016-07-11 DIAGNOSIS — N186 End stage renal disease: Secondary | ICD-10-CM | POA: Diagnosis not present

## 2016-07-11 DIAGNOSIS — N2581 Secondary hyperparathyroidism of renal origin: Secondary | ICD-10-CM | POA: Diagnosis not present

## 2016-07-11 DIAGNOSIS — D509 Iron deficiency anemia, unspecified: Secondary | ICD-10-CM | POA: Diagnosis not present

## 2016-07-11 DIAGNOSIS — D631 Anemia in chronic kidney disease: Secondary | ICD-10-CM | POA: Diagnosis not present

## 2016-07-13 DIAGNOSIS — D631 Anemia in chronic kidney disease: Secondary | ICD-10-CM | POA: Diagnosis not present

## 2016-07-13 DIAGNOSIS — N186 End stage renal disease: Secondary | ICD-10-CM | POA: Diagnosis not present

## 2016-07-13 DIAGNOSIS — E119 Type 2 diabetes mellitus without complications: Secondary | ICD-10-CM | POA: Diagnosis not present

## 2016-07-13 DIAGNOSIS — N2581 Secondary hyperparathyroidism of renal origin: Secondary | ICD-10-CM | POA: Diagnosis not present

## 2016-07-13 DIAGNOSIS — D509 Iron deficiency anemia, unspecified: Secondary | ICD-10-CM | POA: Diagnosis not present

## 2016-07-16 DIAGNOSIS — D509 Iron deficiency anemia, unspecified: Secondary | ICD-10-CM | POA: Diagnosis not present

## 2016-07-16 DIAGNOSIS — N186 End stage renal disease: Secondary | ICD-10-CM | POA: Diagnosis not present

## 2016-07-16 DIAGNOSIS — E119 Type 2 diabetes mellitus without complications: Secondary | ICD-10-CM | POA: Diagnosis not present

## 2016-07-16 DIAGNOSIS — D631 Anemia in chronic kidney disease: Secondary | ICD-10-CM | POA: Diagnosis not present

## 2016-07-16 DIAGNOSIS — N2581 Secondary hyperparathyroidism of renal origin: Secondary | ICD-10-CM | POA: Diagnosis not present

## 2016-07-18 DIAGNOSIS — N2581 Secondary hyperparathyroidism of renal origin: Secondary | ICD-10-CM | POA: Diagnosis not present

## 2016-07-18 DIAGNOSIS — D631 Anemia in chronic kidney disease: Secondary | ICD-10-CM | POA: Diagnosis not present

## 2016-07-18 DIAGNOSIS — D509 Iron deficiency anemia, unspecified: Secondary | ICD-10-CM | POA: Diagnosis not present

## 2016-07-18 DIAGNOSIS — E119 Type 2 diabetes mellitus without complications: Secondary | ICD-10-CM | POA: Diagnosis not present

## 2016-07-18 DIAGNOSIS — N186 End stage renal disease: Secondary | ICD-10-CM | POA: Diagnosis not present

## 2016-07-20 DIAGNOSIS — E119 Type 2 diabetes mellitus without complications: Secondary | ICD-10-CM | POA: Diagnosis not present

## 2016-07-20 DIAGNOSIS — N2581 Secondary hyperparathyroidism of renal origin: Secondary | ICD-10-CM | POA: Diagnosis not present

## 2016-07-20 DIAGNOSIS — N186 End stage renal disease: Secondary | ICD-10-CM | POA: Diagnosis not present

## 2016-07-20 DIAGNOSIS — D631 Anemia in chronic kidney disease: Secondary | ICD-10-CM | POA: Diagnosis not present

## 2016-07-20 DIAGNOSIS — D509 Iron deficiency anemia, unspecified: Secondary | ICD-10-CM | POA: Diagnosis not present

## 2016-07-23 DIAGNOSIS — N186 End stage renal disease: Secondary | ICD-10-CM | POA: Diagnosis not present

## 2016-07-23 DIAGNOSIS — N2581 Secondary hyperparathyroidism of renal origin: Secondary | ICD-10-CM | POA: Diagnosis not present

## 2016-07-23 DIAGNOSIS — D509 Iron deficiency anemia, unspecified: Secondary | ICD-10-CM | POA: Diagnosis not present

## 2016-07-23 DIAGNOSIS — E119 Type 2 diabetes mellitus without complications: Secondary | ICD-10-CM | POA: Diagnosis not present

## 2016-07-23 DIAGNOSIS — D631 Anemia in chronic kidney disease: Secondary | ICD-10-CM | POA: Diagnosis not present

## 2016-07-25 DIAGNOSIS — N2581 Secondary hyperparathyroidism of renal origin: Secondary | ICD-10-CM | POA: Diagnosis not present

## 2016-07-25 DIAGNOSIS — N186 End stage renal disease: Secondary | ICD-10-CM | POA: Diagnosis not present

## 2016-07-25 DIAGNOSIS — D509 Iron deficiency anemia, unspecified: Secondary | ICD-10-CM | POA: Diagnosis not present

## 2016-07-25 DIAGNOSIS — D631 Anemia in chronic kidney disease: Secondary | ICD-10-CM | POA: Diagnosis not present

## 2016-07-25 DIAGNOSIS — E119 Type 2 diabetes mellitus without complications: Secondary | ICD-10-CM | POA: Diagnosis not present

## 2016-07-27 DIAGNOSIS — I129 Hypertensive chronic kidney disease with stage 1 through stage 4 chronic kidney disease, or unspecified chronic kidney disease: Secondary | ICD-10-CM | POA: Diagnosis not present

## 2016-07-27 DIAGNOSIS — D631 Anemia in chronic kidney disease: Secondary | ICD-10-CM | POA: Diagnosis not present

## 2016-07-27 DIAGNOSIS — D509 Iron deficiency anemia, unspecified: Secondary | ICD-10-CM | POA: Diagnosis not present

## 2016-07-27 DIAGNOSIS — E119 Type 2 diabetes mellitus without complications: Secondary | ICD-10-CM | POA: Diagnosis not present

## 2016-07-27 DIAGNOSIS — Z992 Dependence on renal dialysis: Secondary | ICD-10-CM | POA: Diagnosis not present

## 2016-07-27 DIAGNOSIS — N186 End stage renal disease: Secondary | ICD-10-CM | POA: Diagnosis not present

## 2016-07-27 DIAGNOSIS — N2581 Secondary hyperparathyroidism of renal origin: Secondary | ICD-10-CM | POA: Diagnosis not present

## 2016-07-30 DIAGNOSIS — D631 Anemia in chronic kidney disease: Secondary | ICD-10-CM | POA: Diagnosis not present

## 2016-07-30 DIAGNOSIS — N186 End stage renal disease: Secondary | ICD-10-CM | POA: Diagnosis not present

## 2016-07-30 DIAGNOSIS — N2581 Secondary hyperparathyroidism of renal origin: Secondary | ICD-10-CM | POA: Diagnosis not present

## 2016-07-30 DIAGNOSIS — E119 Type 2 diabetes mellitus without complications: Secondary | ICD-10-CM | POA: Diagnosis not present

## 2016-08-01 DIAGNOSIS — N186 End stage renal disease: Secondary | ICD-10-CM | POA: Diagnosis not present

## 2016-08-01 DIAGNOSIS — D631 Anemia in chronic kidney disease: Secondary | ICD-10-CM | POA: Diagnosis not present

## 2016-08-01 DIAGNOSIS — N2581 Secondary hyperparathyroidism of renal origin: Secondary | ICD-10-CM | POA: Diagnosis not present

## 2016-08-01 DIAGNOSIS — E119 Type 2 diabetes mellitus without complications: Secondary | ICD-10-CM | POA: Diagnosis not present

## 2016-08-03 DIAGNOSIS — E119 Type 2 diabetes mellitus without complications: Secondary | ICD-10-CM | POA: Diagnosis not present

## 2016-08-03 DIAGNOSIS — N186 End stage renal disease: Secondary | ICD-10-CM | POA: Diagnosis not present

## 2016-08-03 DIAGNOSIS — N2581 Secondary hyperparathyroidism of renal origin: Secondary | ICD-10-CM | POA: Diagnosis not present

## 2016-08-03 DIAGNOSIS — D631 Anemia in chronic kidney disease: Secondary | ICD-10-CM | POA: Diagnosis not present

## 2016-08-06 DIAGNOSIS — N2581 Secondary hyperparathyroidism of renal origin: Secondary | ICD-10-CM | POA: Diagnosis not present

## 2016-08-06 DIAGNOSIS — N186 End stage renal disease: Secondary | ICD-10-CM | POA: Diagnosis not present

## 2016-08-06 DIAGNOSIS — E119 Type 2 diabetes mellitus without complications: Secondary | ICD-10-CM | POA: Diagnosis not present

## 2016-08-06 DIAGNOSIS — D631 Anemia in chronic kidney disease: Secondary | ICD-10-CM | POA: Diagnosis not present

## 2016-08-08 DIAGNOSIS — N186 End stage renal disease: Secondary | ICD-10-CM | POA: Diagnosis not present

## 2016-08-08 DIAGNOSIS — D631 Anemia in chronic kidney disease: Secondary | ICD-10-CM | POA: Diagnosis not present

## 2016-08-08 DIAGNOSIS — N2581 Secondary hyperparathyroidism of renal origin: Secondary | ICD-10-CM | POA: Diagnosis not present

## 2016-08-08 DIAGNOSIS — E119 Type 2 diabetes mellitus without complications: Secondary | ICD-10-CM | POA: Diagnosis not present

## 2016-08-10 DIAGNOSIS — N2581 Secondary hyperparathyroidism of renal origin: Secondary | ICD-10-CM | POA: Diagnosis not present

## 2016-08-10 DIAGNOSIS — N186 End stage renal disease: Secondary | ICD-10-CM | POA: Diagnosis not present

## 2016-08-10 DIAGNOSIS — E119 Type 2 diabetes mellitus without complications: Secondary | ICD-10-CM | POA: Diagnosis not present

## 2016-08-10 DIAGNOSIS — D631 Anemia in chronic kidney disease: Secondary | ICD-10-CM | POA: Diagnosis not present

## 2016-08-13 DIAGNOSIS — E119 Type 2 diabetes mellitus without complications: Secondary | ICD-10-CM | POA: Diagnosis not present

## 2016-08-13 DIAGNOSIS — N186 End stage renal disease: Secondary | ICD-10-CM | POA: Diagnosis not present

## 2016-08-13 DIAGNOSIS — D631 Anemia in chronic kidney disease: Secondary | ICD-10-CM | POA: Diagnosis not present

## 2016-08-13 DIAGNOSIS — N2581 Secondary hyperparathyroidism of renal origin: Secondary | ICD-10-CM | POA: Diagnosis not present

## 2016-08-15 DIAGNOSIS — D631 Anemia in chronic kidney disease: Secondary | ICD-10-CM | POA: Diagnosis not present

## 2016-08-15 DIAGNOSIS — N186 End stage renal disease: Secondary | ICD-10-CM | POA: Diagnosis not present

## 2016-08-15 DIAGNOSIS — N2581 Secondary hyperparathyroidism of renal origin: Secondary | ICD-10-CM | POA: Diagnosis not present

## 2016-08-15 DIAGNOSIS — E119 Type 2 diabetes mellitus without complications: Secondary | ICD-10-CM | POA: Diagnosis not present

## 2016-08-17 DIAGNOSIS — E119 Type 2 diabetes mellitus without complications: Secondary | ICD-10-CM | POA: Diagnosis not present

## 2016-08-17 DIAGNOSIS — D631 Anemia in chronic kidney disease: Secondary | ICD-10-CM | POA: Diagnosis not present

## 2016-08-17 DIAGNOSIS — N2581 Secondary hyperparathyroidism of renal origin: Secondary | ICD-10-CM | POA: Diagnosis not present

## 2016-08-17 DIAGNOSIS — N186 End stage renal disease: Secondary | ICD-10-CM | POA: Diagnosis not present

## 2016-08-19 ENCOUNTER — Ambulatory Visit (HOSPITAL_BASED_OUTPATIENT_CLINIC_OR_DEPARTMENT_OTHER): Payer: Medicare Other

## 2016-08-19 DIAGNOSIS — C8338 Diffuse large B-cell lymphoma, lymph nodes of multiple sites: Secondary | ICD-10-CM | POA: Diagnosis not present

## 2016-08-19 DIAGNOSIS — Z452 Encounter for adjustment and management of vascular access device: Secondary | ICD-10-CM

## 2016-08-19 DIAGNOSIS — Z95828 Presence of other vascular implants and grafts: Secondary | ICD-10-CM

## 2016-08-19 MED ORDER — HEPARIN SOD (PORK) LOCK FLUSH 100 UNIT/ML IV SOLN
500.0000 [IU] | Freq: Once | INTRAVENOUS | Status: AC | PRN
  Administered 2016-08-19: 500 [IU] via INTRAVENOUS
  Filled 2016-08-19: qty 5

## 2016-08-19 MED ORDER — SODIUM CHLORIDE 0.9 % IJ SOLN
10.0000 mL | INTRAMUSCULAR | Status: DC | PRN
  Administered 2016-08-19: 10 mL via INTRAVENOUS
  Filled 2016-08-19: qty 10

## 2016-08-19 NOTE — Patient Instructions (Signed)

## 2016-08-20 DIAGNOSIS — N186 End stage renal disease: Secondary | ICD-10-CM | POA: Diagnosis not present

## 2016-08-20 DIAGNOSIS — D631 Anemia in chronic kidney disease: Secondary | ICD-10-CM | POA: Diagnosis not present

## 2016-08-20 DIAGNOSIS — N2581 Secondary hyperparathyroidism of renal origin: Secondary | ICD-10-CM | POA: Diagnosis not present

## 2016-08-20 DIAGNOSIS — E119 Type 2 diabetes mellitus without complications: Secondary | ICD-10-CM | POA: Diagnosis not present

## 2016-08-22 DIAGNOSIS — N2581 Secondary hyperparathyroidism of renal origin: Secondary | ICD-10-CM | POA: Diagnosis not present

## 2016-08-22 DIAGNOSIS — D631 Anemia in chronic kidney disease: Secondary | ICD-10-CM | POA: Diagnosis not present

## 2016-08-22 DIAGNOSIS — E119 Type 2 diabetes mellitus without complications: Secondary | ICD-10-CM | POA: Diagnosis not present

## 2016-08-22 DIAGNOSIS — N186 End stage renal disease: Secondary | ICD-10-CM | POA: Diagnosis not present

## 2016-08-24 DIAGNOSIS — N2581 Secondary hyperparathyroidism of renal origin: Secondary | ICD-10-CM | POA: Diagnosis not present

## 2016-08-24 DIAGNOSIS — E119 Type 2 diabetes mellitus without complications: Secondary | ICD-10-CM | POA: Diagnosis not present

## 2016-08-24 DIAGNOSIS — N186 End stage renal disease: Secondary | ICD-10-CM | POA: Diagnosis not present

## 2016-08-24 DIAGNOSIS — D631 Anemia in chronic kidney disease: Secondary | ICD-10-CM | POA: Diagnosis not present

## 2016-08-27 DIAGNOSIS — N186 End stage renal disease: Secondary | ICD-10-CM | POA: Diagnosis not present

## 2016-08-27 DIAGNOSIS — Z992 Dependence on renal dialysis: Secondary | ICD-10-CM | POA: Diagnosis not present

## 2016-08-27 DIAGNOSIS — E119 Type 2 diabetes mellitus without complications: Secondary | ICD-10-CM | POA: Diagnosis not present

## 2016-08-27 DIAGNOSIS — N2581 Secondary hyperparathyroidism of renal origin: Secondary | ICD-10-CM | POA: Diagnosis not present

## 2016-08-27 DIAGNOSIS — D631 Anemia in chronic kidney disease: Secondary | ICD-10-CM | POA: Diagnosis not present

## 2016-08-27 DIAGNOSIS — I129 Hypertensive chronic kidney disease with stage 1 through stage 4 chronic kidney disease, or unspecified chronic kidney disease: Secondary | ICD-10-CM | POA: Diagnosis not present

## 2016-08-28 DIAGNOSIS — L602 Onychogryphosis: Secondary | ICD-10-CM | POA: Diagnosis not present

## 2016-08-28 DIAGNOSIS — L84 Corns and callosities: Secondary | ICD-10-CM | POA: Diagnosis not present

## 2016-08-28 DIAGNOSIS — E1351 Other specified diabetes mellitus with diabetic peripheral angiopathy without gangrene: Secondary | ICD-10-CM | POA: Diagnosis not present

## 2016-08-29 DIAGNOSIS — E119 Type 2 diabetes mellitus without complications: Secondary | ICD-10-CM | POA: Diagnosis not present

## 2016-08-29 DIAGNOSIS — N2581 Secondary hyperparathyroidism of renal origin: Secondary | ICD-10-CM | POA: Diagnosis not present

## 2016-08-29 DIAGNOSIS — N186 End stage renal disease: Secondary | ICD-10-CM | POA: Diagnosis not present

## 2016-08-29 DIAGNOSIS — D631 Anemia in chronic kidney disease: Secondary | ICD-10-CM | POA: Diagnosis not present

## 2016-08-31 DIAGNOSIS — N186 End stage renal disease: Secondary | ICD-10-CM | POA: Diagnosis not present

## 2016-08-31 DIAGNOSIS — N2581 Secondary hyperparathyroidism of renal origin: Secondary | ICD-10-CM | POA: Diagnosis not present

## 2016-08-31 DIAGNOSIS — D631 Anemia in chronic kidney disease: Secondary | ICD-10-CM | POA: Diagnosis not present

## 2016-08-31 DIAGNOSIS — E119 Type 2 diabetes mellitus without complications: Secondary | ICD-10-CM | POA: Diagnosis not present

## 2016-09-03 DIAGNOSIS — E119 Type 2 diabetes mellitus without complications: Secondary | ICD-10-CM | POA: Diagnosis not present

## 2016-09-03 DIAGNOSIS — N186 End stage renal disease: Secondary | ICD-10-CM | POA: Diagnosis not present

## 2016-09-03 DIAGNOSIS — D631 Anemia in chronic kidney disease: Secondary | ICD-10-CM | POA: Diagnosis not present

## 2016-09-03 DIAGNOSIS — N2581 Secondary hyperparathyroidism of renal origin: Secondary | ICD-10-CM | POA: Diagnosis not present

## 2016-09-05 DIAGNOSIS — N2581 Secondary hyperparathyroidism of renal origin: Secondary | ICD-10-CM | POA: Diagnosis not present

## 2016-09-05 DIAGNOSIS — E119 Type 2 diabetes mellitus without complications: Secondary | ICD-10-CM | POA: Diagnosis not present

## 2016-09-05 DIAGNOSIS — N186 End stage renal disease: Secondary | ICD-10-CM | POA: Diagnosis not present

## 2016-09-05 DIAGNOSIS — D631 Anemia in chronic kidney disease: Secondary | ICD-10-CM | POA: Diagnosis not present

## 2016-09-07 DIAGNOSIS — N186 End stage renal disease: Secondary | ICD-10-CM | POA: Diagnosis not present

## 2016-09-07 DIAGNOSIS — E119 Type 2 diabetes mellitus without complications: Secondary | ICD-10-CM | POA: Diagnosis not present

## 2016-09-07 DIAGNOSIS — N2581 Secondary hyperparathyroidism of renal origin: Secondary | ICD-10-CM | POA: Diagnosis not present

## 2016-09-07 DIAGNOSIS — D631 Anemia in chronic kidney disease: Secondary | ICD-10-CM | POA: Diagnosis not present

## 2016-09-10 DIAGNOSIS — N2581 Secondary hyperparathyroidism of renal origin: Secondary | ICD-10-CM | POA: Diagnosis not present

## 2016-09-10 DIAGNOSIS — E119 Type 2 diabetes mellitus without complications: Secondary | ICD-10-CM | POA: Diagnosis not present

## 2016-09-10 DIAGNOSIS — N186 End stage renal disease: Secondary | ICD-10-CM | POA: Diagnosis not present

## 2016-09-10 DIAGNOSIS — D631 Anemia in chronic kidney disease: Secondary | ICD-10-CM | POA: Diagnosis not present

## 2016-09-12 DIAGNOSIS — E119 Type 2 diabetes mellitus without complications: Secondary | ICD-10-CM | POA: Diagnosis not present

## 2016-09-12 DIAGNOSIS — N186 End stage renal disease: Secondary | ICD-10-CM | POA: Diagnosis not present

## 2016-09-12 DIAGNOSIS — N2581 Secondary hyperparathyroidism of renal origin: Secondary | ICD-10-CM | POA: Diagnosis not present

## 2016-09-12 DIAGNOSIS — D631 Anemia in chronic kidney disease: Secondary | ICD-10-CM | POA: Diagnosis not present

## 2016-09-14 DIAGNOSIS — N186 End stage renal disease: Secondary | ICD-10-CM | POA: Diagnosis not present

## 2016-09-14 DIAGNOSIS — N2581 Secondary hyperparathyroidism of renal origin: Secondary | ICD-10-CM | POA: Diagnosis not present

## 2016-09-14 DIAGNOSIS — D631 Anemia in chronic kidney disease: Secondary | ICD-10-CM | POA: Diagnosis not present

## 2016-09-14 DIAGNOSIS — E119 Type 2 diabetes mellitus without complications: Secondary | ICD-10-CM | POA: Diagnosis not present

## 2016-09-17 DIAGNOSIS — E119 Type 2 diabetes mellitus without complications: Secondary | ICD-10-CM | POA: Diagnosis not present

## 2016-09-17 DIAGNOSIS — D631 Anemia in chronic kidney disease: Secondary | ICD-10-CM | POA: Diagnosis not present

## 2016-09-17 DIAGNOSIS — N2581 Secondary hyperparathyroidism of renal origin: Secondary | ICD-10-CM | POA: Diagnosis not present

## 2016-09-17 DIAGNOSIS — N186 End stage renal disease: Secondary | ICD-10-CM | POA: Diagnosis not present

## 2016-09-19 DIAGNOSIS — N186 End stage renal disease: Secondary | ICD-10-CM | POA: Diagnosis not present

## 2016-09-19 DIAGNOSIS — N2581 Secondary hyperparathyroidism of renal origin: Secondary | ICD-10-CM | POA: Diagnosis not present

## 2016-09-19 DIAGNOSIS — E119 Type 2 diabetes mellitus without complications: Secondary | ICD-10-CM | POA: Diagnosis not present

## 2016-09-19 DIAGNOSIS — D631 Anemia in chronic kidney disease: Secondary | ICD-10-CM | POA: Diagnosis not present

## 2016-09-21 DIAGNOSIS — N186 End stage renal disease: Secondary | ICD-10-CM | POA: Diagnosis not present

## 2016-09-21 DIAGNOSIS — E119 Type 2 diabetes mellitus without complications: Secondary | ICD-10-CM | POA: Diagnosis not present

## 2016-09-21 DIAGNOSIS — D631 Anemia in chronic kidney disease: Secondary | ICD-10-CM | POA: Diagnosis not present

## 2016-09-21 DIAGNOSIS — N2581 Secondary hyperparathyroidism of renal origin: Secondary | ICD-10-CM | POA: Diagnosis not present

## 2016-09-24 DIAGNOSIS — N186 End stage renal disease: Secondary | ICD-10-CM | POA: Diagnosis not present

## 2016-09-24 DIAGNOSIS — N2581 Secondary hyperparathyroidism of renal origin: Secondary | ICD-10-CM | POA: Diagnosis not present

## 2016-09-24 DIAGNOSIS — E119 Type 2 diabetes mellitus without complications: Secondary | ICD-10-CM | POA: Diagnosis not present

## 2016-09-24 DIAGNOSIS — D631 Anemia in chronic kidney disease: Secondary | ICD-10-CM | POA: Diagnosis not present

## 2016-09-26 DIAGNOSIS — E119 Type 2 diabetes mellitus without complications: Secondary | ICD-10-CM | POA: Diagnosis not present

## 2016-09-26 DIAGNOSIS — D631 Anemia in chronic kidney disease: Secondary | ICD-10-CM | POA: Diagnosis not present

## 2016-09-26 DIAGNOSIS — N186 End stage renal disease: Secondary | ICD-10-CM | POA: Diagnosis not present

## 2016-09-26 DIAGNOSIS — N2581 Secondary hyperparathyroidism of renal origin: Secondary | ICD-10-CM | POA: Diagnosis not present

## 2016-09-27 DIAGNOSIS — Z992 Dependence on renal dialysis: Secondary | ICD-10-CM | POA: Diagnosis not present

## 2016-09-27 DIAGNOSIS — I129 Hypertensive chronic kidney disease with stage 1 through stage 4 chronic kidney disease, or unspecified chronic kidney disease: Secondary | ICD-10-CM | POA: Diagnosis not present

## 2016-09-27 DIAGNOSIS — N186 End stage renal disease: Secondary | ICD-10-CM | POA: Diagnosis not present

## 2016-09-28 DIAGNOSIS — N186 End stage renal disease: Secondary | ICD-10-CM | POA: Diagnosis not present

## 2016-09-28 DIAGNOSIS — N2581 Secondary hyperparathyroidism of renal origin: Secondary | ICD-10-CM | POA: Diagnosis not present

## 2016-09-28 DIAGNOSIS — E119 Type 2 diabetes mellitus without complications: Secondary | ICD-10-CM | POA: Diagnosis not present

## 2016-10-01 DIAGNOSIS — N2581 Secondary hyperparathyroidism of renal origin: Secondary | ICD-10-CM | POA: Diagnosis not present

## 2016-10-01 DIAGNOSIS — E119 Type 2 diabetes mellitus without complications: Secondary | ICD-10-CM | POA: Diagnosis not present

## 2016-10-01 DIAGNOSIS — N186 End stage renal disease: Secondary | ICD-10-CM | POA: Diagnosis not present

## 2016-10-03 DIAGNOSIS — N186 End stage renal disease: Secondary | ICD-10-CM | POA: Diagnosis not present

## 2016-10-03 DIAGNOSIS — N2581 Secondary hyperparathyroidism of renal origin: Secondary | ICD-10-CM | POA: Diagnosis not present

## 2016-10-03 DIAGNOSIS — E119 Type 2 diabetes mellitus without complications: Secondary | ICD-10-CM | POA: Diagnosis not present

## 2016-10-05 DIAGNOSIS — N2581 Secondary hyperparathyroidism of renal origin: Secondary | ICD-10-CM | POA: Diagnosis not present

## 2016-10-05 DIAGNOSIS — E119 Type 2 diabetes mellitus without complications: Secondary | ICD-10-CM | POA: Diagnosis not present

## 2016-10-05 DIAGNOSIS — N186 End stage renal disease: Secondary | ICD-10-CM | POA: Diagnosis not present

## 2016-10-07 ENCOUNTER — Ambulatory Visit (HOSPITAL_BASED_OUTPATIENT_CLINIC_OR_DEPARTMENT_OTHER): Payer: Medicare Other | Admitting: Hematology and Oncology

## 2016-10-07 ENCOUNTER — Other Ambulatory Visit: Payer: Self-pay | Admitting: Hematology and Oncology

## 2016-10-07 ENCOUNTER — Encounter: Payer: Self-pay | Admitting: Hematology and Oncology

## 2016-10-07 ENCOUNTER — Ambulatory Visit (HOSPITAL_BASED_OUTPATIENT_CLINIC_OR_DEPARTMENT_OTHER): Payer: Medicare Other

## 2016-10-07 ENCOUNTER — Telehealth: Payer: Self-pay | Admitting: Hematology and Oncology

## 2016-10-07 ENCOUNTER — Other Ambulatory Visit (HOSPITAL_BASED_OUTPATIENT_CLINIC_OR_DEPARTMENT_OTHER): Payer: Medicare Other

## 2016-10-07 DIAGNOSIS — D631 Anemia in chronic kidney disease: Secondary | ICD-10-CM

## 2016-10-07 DIAGNOSIS — C8338 Diffuse large B-cell lymphoma, lymph nodes of multiple sites: Secondary | ICD-10-CM

## 2016-10-07 DIAGNOSIS — N186 End stage renal disease: Secondary | ICD-10-CM

## 2016-10-07 DIAGNOSIS — N189 Chronic kidney disease, unspecified: Secondary | ICD-10-CM

## 2016-10-07 DIAGNOSIS — Z95828 Presence of other vascular implants and grafts: Secondary | ICD-10-CM

## 2016-10-07 DIAGNOSIS — L309 Dermatitis, unspecified: Secondary | ICD-10-CM | POA: Diagnosis not present

## 2016-10-07 DIAGNOSIS — Z992 Dependence on renal dialysis: Secondary | ICD-10-CM

## 2016-10-07 LAB — CBC WITH DIFFERENTIAL/PLATELET
BASO%: 1.6 % (ref 0.0–2.0)
Basophils Absolute: 0.1 10*3/uL (ref 0.0–0.1)
EOS%: 22.5 % — AB (ref 0.0–7.0)
Eosinophils Absolute: 1.3 10*3/uL — ABNORMAL HIGH (ref 0.0–0.5)
HEMATOCRIT: 40 % (ref 38.4–49.9)
HEMOGLOBIN: 12.7 g/dL — AB (ref 13.0–17.1)
LYMPH#: 1.3 10*3/uL (ref 0.9–3.3)
LYMPH%: 22.9 % (ref 14.0–49.0)
MCH: 32.6 pg (ref 27.2–33.4)
MCHC: 31.8 g/dL — ABNORMAL LOW (ref 32.0–36.0)
MCV: 102.6 fL — ABNORMAL HIGH (ref 79.3–98.0)
MONO#: 0.6 10*3/uL (ref 0.1–0.9)
MONO%: 11.3 % (ref 0.0–14.0)
NEUT%: 41.7 % (ref 39.0–75.0)
NEUTROS ABS: 2.4 10*3/uL (ref 1.5–6.5)
Platelets: 218 10*3/uL (ref 140–400)
RBC: 3.9 10*6/uL — ABNORMAL LOW (ref 4.20–5.82)
RDW: 18 % — AB (ref 11.0–14.6)
WBC: 5.7 10*3/uL (ref 4.0–10.3)
nRBC: 0 % (ref 0–0)

## 2016-10-07 MED ORDER — SODIUM CHLORIDE 0.9 % IJ SOLN
10.0000 mL | INTRAMUSCULAR | Status: DC | PRN
  Administered 2016-10-07: 10 mL via INTRAVENOUS
  Filled 2016-10-07: qty 10

## 2016-10-07 MED ORDER — HEPARIN SOD (PORK) LOCK FLUSH 100 UNIT/ML IV SOLN
500.0000 [IU] | Freq: Once | INTRAVENOUS | Status: AC | PRN
Start: 2016-10-07 — End: 2016-10-07
  Administered 2016-10-07: 500 [IU] via INTRAVENOUS
  Filled 2016-10-07: qty 5

## 2016-10-07 NOTE — Assessment & Plan Note (Signed)
The patient was referred to see dermatologist and was prescribed special ointment His rash is improving dramatically in eosinophil count is improved I would defer to his dermatologist for further management He still have persistent eosinophilia I recommend he continue over-the-counter allergy medicine

## 2016-10-07 NOTE — Progress Notes (Signed)
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[place])      06/12/2015 Imaging    CT chest showed lymphadenopathy within the low neck and low chest, consistent with active lymphoma      06/14/2015 Procedure    He underwent CT-guided biopsy      06/14/2015 Pathology Results    Biopsy of the retroperitoneal mass confirmed diffuse large B-cell lymphoma      06/21/2015 Imaging    ECHO showed normal EF      06/21/2015 Procedure    He has port placement      06/23/2015 - 10/12/2015 Chemotherapy    He received mini-RCHOP chemo x 6 cycles      08/25/2015 PET scan    Significant interval decrease in size of the bulky retroperitoneal lymphadenopathy suggesting an excellent response to therapy. Residual matted soft tissue density in the retroperitoneum with SUV max of approximately 3.3      11/27/2015 PET scan    Continued improved appearance of the treated lymphoma. Minimal residual matted soft tissue density but no discrete measurable disease and no hypermetabolism to suggest metabolically active tumor.      05/24/2016 Imaging    Unchanged retroperitoneal soft tissue most compatible with history of treated lymphoma. No new or enlarging adenopathy in the chest, abdomen or pelvis. Polycystic renal disease. Bilateral fat containing inguinal hernias. Aortic atherosclerosis.       INTERVAL HISTORY: Please see below for problem oriented charting. He returns for further follow-up He feels well No new lymphadenopathy Denies recent infection His skin rash is improving  REVIEW OF SYSTEMS:   Constitutional:  Denies fevers, chills or abnormal weight loss Eyes: Denies blurriness of vision Ears, nose, mouth, throat, and face: Denies mucositis or sore throat Respiratory: Denies cough, dyspnea or wheezes Cardiovascular: Denies palpitation, chest discomfort or lower extremity swelling Gastrointestinal:  Denies nausea, heartburn or change in bowel habits Skin: Denies abnormal skin rashes Lymphatics: Denies new lymphadenopathy or easy bruising Neurological:Denies numbness, tingling or new weaknesses Behavioral/Psych: Mood is stable, no new changes  All other systems were reviewed with the patient and are negative.  I have reviewed the past medical history, past surgical history, social history and family history with the patient and they are unchanged from previous note.  ALLERGIES:  is allergic to tape.  MEDICATIONS:  Current Outpatient Prescriptions  Medication Sig Dispense Refill  . aspirin 325 MG tablet Take 1 tablet (325 mg total) by mouth daily. 30 tablet 0  . atorvastatin (LIPITOR) 10 MG tablet Take 10 mg by mouth daily.  0  . calcium acetate (PHOSLO) 667 MG capsule Take 1 capsule by mouth 2 (two) times daily.   11  . clobetasol ointment (TEMOVATE) 1.61 % Apply 1 application topically 2 (two) times daily.    . cloNIDine (CATAPRES) 0.2 MG tablet Take 0.4 mg by mouth 2 (two) times daily.     Marland Kitchen docusate sodium (COLACE) 100 MG capsule Take 1 capsule (100 mg total) by mouth every 12 (twelve) hours. (Patient taking differently: Take 100 mg by mouth every 12 (twelve) hours. ) 60 capsule 0  . hydrOXYzine (ATARAX/VISTARIL) 10 MG tablet  Take 10 mg by mouth daily.    . isosorbide-hydrALAZINE (BIDIL) 20-37.5 MG tablet Take 0.5 tablets by mouth 2 (two) times daily.    Marland Kitchen levETIRAcetam (KEPPRA) 500 MG tablet Take 1 tablet (500 mg total) by mouth 2 (two) times daily. 60 tablet 6  . levothyroxine (SYNTHROID, LEVOTHROID) 25 MCG tablet Take 25 mcg by mouth daily.   0  . lidocaine-prilocaine (EMLA) cream Apply  to affected area once 30 g 3  . metoprolol (LOPRESSOR) 100 MG tablet Take 150 mg by mouth 2 (two) times daily.     . multivitamin (RENA-VIT) TABS tablet Take 1 tablet by mouth at bedtime. 90 tablet 0   No current facility-administered medications for this visit.     PHYSICAL EXAMINATION: ECOG PERFORMANCE STATUS: 1 - Symptomatic but completely ambulatory  Vitals:   10/07/16 1118  BP: 127/83  Pulse: (!) 54  Resp: 18  Temp: 98.1 F (36.7 C)  SpO2: 100%   Filed Weights   10/07/16 1118  Weight: 190 lb 3.2 oz (86.3 kg)    GENERAL:alert, no distress and comfortable SKIN: Noted persistent erythema around the fistula site.   EYES: normal, Conjunctiva are pink and non-injected, sclera clear OROPHARYNX:no exudate, no erythema and lips, buccal mucosa, and tongue normal  NECK: supple, thyroid normal size, non-tender, without nodularity LYMPH:  no palpable lymphadenopathy in the cervical, axillary or inguinal LUNGS: clear to auscultation and percussion with normal breathing effort HEART: regular rate & rhythm and no murmurs and no lower extremity edema ABDOMEN:abdomen soft, non-tender and normal bowel sounds Musculoskeletal:no cyanosis of digits and no clubbing  NEURO: alert & oriented x 3 with fluent speech, no focal motor/sensory deficits  LABORATORY DATA:  I have reviewed the data as listed    Component Value Date/Time   NA 138 07/08/2016 1052   K 4.3 07/08/2016 1052   CL 95 (L) 06/21/2015 1345   CO2 29 07/08/2016 1052   GLUCOSE 87 07/08/2016 1052   BUN 42.7 (H) 07/08/2016 1052   CREATININE 8.7 (HH) 07/08/2016 1052   CALCIUM 9.6 07/08/2016 1052   PROT 6.7 07/08/2016 1052   ALBUMIN 3.1 (L) 07/08/2016 1052   AST 15 07/08/2016 1052   ALT 14 07/08/2016 1052   ALKPHOS 62 07/08/2016 1052   BILITOT 0.41 07/08/2016 1052   GFRNONAA 8 (L) 06/21/2015 1345   GFRAA 9 (L) 06/21/2015 1345    No results found for: SPEP, UPEP  Lab Results  Component Value Date   WBC 5.7 10/07/2016    NEUTROABS 2.4 10/07/2016   HGB 12.7 (L) 10/07/2016   HCT 40.0 10/07/2016   MCV 102.6 (H) 10/07/2016   PLT 218 10/07/2016      Chemistry      Component Value Date/Time   NA 138 07/08/2016 1052   K 4.3 07/08/2016 1052   CL 95 (L) 06/21/2015 1345   CO2 29 07/08/2016 1052   BUN 42.7 (H) 07/08/2016 1052   CREATININE 8.7 (HH) 07/08/2016 1052      Component Value Date/Time   CALCIUM 9.6 07/08/2016 1052   ALKPHOS 62 07/08/2016 1052   AST 15 07/08/2016 1052   ALT 14 07/08/2016 1052   BILITOT 0.41 07/08/2016 1052       ASSESSMENT & PLAN:  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 I recommend observation moving forward with repeat blood work, history and examination in 3 months  With stability of clinical findings, I do not recommend repeat imaging until next year, due around April 2019 I will get his port flushed every 6 weeks to maintain port patency.  Anemia in chronic kidney disease This is likely anemia of chronic disease. The patient denies recent history of bleeding such as epistaxis, hematuria or hematochezia. He is asymptomatic from the anemia. We will observe for now.  He does not require transfusion now.  I would defer ESA management to nephrologist.  Dermatitis due to unknown cause The patient was referred to see dermatologist and was prescribed special ointment His rash is improving dramatically in eosinophil count is improved I would defer to his dermatologist for further management He still have persistent eosinophilia I recommend he continue over-the-counter allergy medicine   No orders of the defined types were placed in this encounter.  All questions were answered. The patient knows to call the clinic with any problems, questions or  concerns. No barriers to learning was detected. I spent 15 minutes counseling the patient face to face. The total time spent in the appointment was 20 minutes and more than 50% was on counseling and review of test results     Heath Lark, MD 10/07/2016 1:35 PM

## 2016-10-07 NOTE — Assessment & Plan Note (Signed)
His last PET CT scan from October 2017 show complete response to treatment We reviewed recent imaging study of CT dated 05/24/2016 which show no evidence of disease We reviewed the current guidelines. I recommend observation moving forward with repeat blood work, history and examination in 3 months  With stability of clinical findings, I do not recommend repeat imaging until next year, due around April 2019 I will get his port flushed every 6 weeks to maintain port patency.

## 2016-10-07 NOTE — Assessment & Plan Note (Signed)
This is likely anemia of chronic disease. The patient denies recent history of bleeding such as epistaxis, hematuria or hematochezia. He is asymptomatic from the anemia. We will observe for now.  He does not require transfusion now.  I would defer ESA management to nephrologist.

## 2016-10-07 NOTE — Telephone Encounter (Signed)
Gave patient avs report and appointments for October and December  °

## 2016-10-08 DIAGNOSIS — N2581 Secondary hyperparathyroidism of renal origin: Secondary | ICD-10-CM | POA: Diagnosis not present

## 2016-10-08 DIAGNOSIS — N186 End stage renal disease: Secondary | ICD-10-CM | POA: Diagnosis not present

## 2016-10-08 DIAGNOSIS — E119 Type 2 diabetes mellitus without complications: Secondary | ICD-10-CM | POA: Diagnosis not present

## 2016-10-09 DIAGNOSIS — R7309 Other abnormal glucose: Secondary | ICD-10-CM | POA: Diagnosis not present

## 2016-10-09 DIAGNOSIS — Z125 Encounter for screening for malignant neoplasm of prostate: Secondary | ICD-10-CM | POA: Diagnosis not present

## 2016-10-09 DIAGNOSIS — E782 Mixed hyperlipidemia: Secondary | ICD-10-CM | POA: Diagnosis not present

## 2016-10-09 DIAGNOSIS — H6123 Impacted cerumen, bilateral: Secondary | ICD-10-CM | POA: Diagnosis not present

## 2016-10-09 DIAGNOSIS — C8590 Non-Hodgkin lymphoma, unspecified, unspecified site: Secondary | ICD-10-CM | POA: Diagnosis not present

## 2016-10-09 DIAGNOSIS — I120 Hypertensive chronic kidney disease with stage 5 chronic kidney disease or end stage renal disease: Secondary | ICD-10-CM | POA: Diagnosis not present

## 2016-10-09 DIAGNOSIS — Z992 Dependence on renal dialysis: Secondary | ICD-10-CM | POA: Diagnosis not present

## 2016-10-09 DIAGNOSIS — N186 End stage renal disease: Secondary | ICD-10-CM | POA: Diagnosis not present

## 2016-10-09 DIAGNOSIS — Z0000 Encounter for general adult medical examination without abnormal findings: Secondary | ICD-10-CM | POA: Diagnosis not present

## 2016-10-09 DIAGNOSIS — E039 Hypothyroidism, unspecified: Secondary | ICD-10-CM | POA: Diagnosis not present

## 2016-10-10 DIAGNOSIS — N2581 Secondary hyperparathyroidism of renal origin: Secondary | ICD-10-CM | POA: Diagnosis not present

## 2016-10-10 DIAGNOSIS — N186 End stage renal disease: Secondary | ICD-10-CM | POA: Diagnosis not present

## 2016-10-10 DIAGNOSIS — E119 Type 2 diabetes mellitus without complications: Secondary | ICD-10-CM | POA: Diagnosis not present

## 2016-10-12 DIAGNOSIS — E119 Type 2 diabetes mellitus without complications: Secondary | ICD-10-CM | POA: Diagnosis not present

## 2016-10-12 DIAGNOSIS — N186 End stage renal disease: Secondary | ICD-10-CM | POA: Diagnosis not present

## 2016-10-12 DIAGNOSIS — N2581 Secondary hyperparathyroidism of renal origin: Secondary | ICD-10-CM | POA: Diagnosis not present

## 2016-10-15 DIAGNOSIS — E119 Type 2 diabetes mellitus without complications: Secondary | ICD-10-CM | POA: Diagnosis not present

## 2016-10-15 DIAGNOSIS — N2581 Secondary hyperparathyroidism of renal origin: Secondary | ICD-10-CM | POA: Diagnosis not present

## 2016-10-15 DIAGNOSIS — N186 End stage renal disease: Secondary | ICD-10-CM | POA: Diagnosis not present

## 2016-10-17 DIAGNOSIS — N2581 Secondary hyperparathyroidism of renal origin: Secondary | ICD-10-CM | POA: Diagnosis not present

## 2016-10-17 DIAGNOSIS — N186 End stage renal disease: Secondary | ICD-10-CM | POA: Diagnosis not present

## 2016-10-17 DIAGNOSIS — E119 Type 2 diabetes mellitus without complications: Secondary | ICD-10-CM | POA: Diagnosis not present

## 2016-10-19 DIAGNOSIS — E119 Type 2 diabetes mellitus without complications: Secondary | ICD-10-CM | POA: Diagnosis not present

## 2016-10-19 DIAGNOSIS — N186 End stage renal disease: Secondary | ICD-10-CM | POA: Diagnosis not present

## 2016-10-19 DIAGNOSIS — N2581 Secondary hyperparathyroidism of renal origin: Secondary | ICD-10-CM | POA: Diagnosis not present

## 2016-10-22 DIAGNOSIS — E119 Type 2 diabetes mellitus without complications: Secondary | ICD-10-CM | POA: Diagnosis not present

## 2016-10-22 DIAGNOSIS — N186 End stage renal disease: Secondary | ICD-10-CM | POA: Diagnosis not present

## 2016-10-22 DIAGNOSIS — N2581 Secondary hyperparathyroidism of renal origin: Secondary | ICD-10-CM | POA: Diagnosis not present

## 2016-10-24 DIAGNOSIS — N2581 Secondary hyperparathyroidism of renal origin: Secondary | ICD-10-CM | POA: Diagnosis not present

## 2016-10-24 DIAGNOSIS — E119 Type 2 diabetes mellitus without complications: Secondary | ICD-10-CM | POA: Diagnosis not present

## 2016-10-24 DIAGNOSIS — N186 End stage renal disease: Secondary | ICD-10-CM | POA: Diagnosis not present

## 2016-10-26 DIAGNOSIS — E119 Type 2 diabetes mellitus without complications: Secondary | ICD-10-CM | POA: Diagnosis not present

## 2016-10-26 DIAGNOSIS — N186 End stage renal disease: Secondary | ICD-10-CM | POA: Diagnosis not present

## 2016-10-26 DIAGNOSIS — N2581 Secondary hyperparathyroidism of renal origin: Secondary | ICD-10-CM | POA: Diagnosis not present

## 2016-10-27 DIAGNOSIS — Z992 Dependence on renal dialysis: Secondary | ICD-10-CM | POA: Diagnosis not present

## 2016-10-27 DIAGNOSIS — I129 Hypertensive chronic kidney disease with stage 1 through stage 4 chronic kidney disease, or unspecified chronic kidney disease: Secondary | ICD-10-CM | POA: Diagnosis not present

## 2016-10-27 DIAGNOSIS — N186 End stage renal disease: Secondary | ICD-10-CM | POA: Diagnosis not present

## 2016-10-29 DIAGNOSIS — E119 Type 2 diabetes mellitus without complications: Secondary | ICD-10-CM | POA: Diagnosis not present

## 2016-10-29 DIAGNOSIS — N2581 Secondary hyperparathyroidism of renal origin: Secondary | ICD-10-CM | POA: Diagnosis not present

## 2016-10-29 DIAGNOSIS — N186 End stage renal disease: Secondary | ICD-10-CM | POA: Diagnosis not present

## 2016-10-31 DIAGNOSIS — N186 End stage renal disease: Secondary | ICD-10-CM | POA: Diagnosis not present

## 2016-10-31 DIAGNOSIS — N2581 Secondary hyperparathyroidism of renal origin: Secondary | ICD-10-CM | POA: Diagnosis not present

## 2016-10-31 DIAGNOSIS — E119 Type 2 diabetes mellitus without complications: Secondary | ICD-10-CM | POA: Diagnosis not present

## 2016-11-01 DIAGNOSIS — N186 End stage renal disease: Secondary | ICD-10-CM | POA: Diagnosis not present

## 2016-11-01 DIAGNOSIS — I871 Compression of vein: Secondary | ICD-10-CM | POA: Diagnosis not present

## 2016-11-01 DIAGNOSIS — Z992 Dependence on renal dialysis: Secondary | ICD-10-CM | POA: Diagnosis not present

## 2016-11-02 DIAGNOSIS — N2581 Secondary hyperparathyroidism of renal origin: Secondary | ICD-10-CM | POA: Diagnosis not present

## 2016-11-02 DIAGNOSIS — E119 Type 2 diabetes mellitus without complications: Secondary | ICD-10-CM | POA: Diagnosis not present

## 2016-11-02 DIAGNOSIS — N186 End stage renal disease: Secondary | ICD-10-CM | POA: Diagnosis not present

## 2016-11-05 DIAGNOSIS — N186 End stage renal disease: Secondary | ICD-10-CM | POA: Diagnosis not present

## 2016-11-05 DIAGNOSIS — E119 Type 2 diabetes mellitus without complications: Secondary | ICD-10-CM | POA: Diagnosis not present

## 2016-11-05 DIAGNOSIS — N2581 Secondary hyperparathyroidism of renal origin: Secondary | ICD-10-CM | POA: Diagnosis not present

## 2016-11-07 DIAGNOSIS — E119 Type 2 diabetes mellitus without complications: Secondary | ICD-10-CM | POA: Diagnosis not present

## 2016-11-07 DIAGNOSIS — N2581 Secondary hyperparathyroidism of renal origin: Secondary | ICD-10-CM | POA: Diagnosis not present

## 2016-11-07 DIAGNOSIS — N186 End stage renal disease: Secondary | ICD-10-CM | POA: Diagnosis not present

## 2016-11-09 DIAGNOSIS — N186 End stage renal disease: Secondary | ICD-10-CM | POA: Diagnosis not present

## 2016-11-09 DIAGNOSIS — N2581 Secondary hyperparathyroidism of renal origin: Secondary | ICD-10-CM | POA: Diagnosis not present

## 2016-11-09 DIAGNOSIS — E119 Type 2 diabetes mellitus without complications: Secondary | ICD-10-CM | POA: Diagnosis not present

## 2016-11-12 DIAGNOSIS — N2581 Secondary hyperparathyroidism of renal origin: Secondary | ICD-10-CM | POA: Diagnosis not present

## 2016-11-12 DIAGNOSIS — E119 Type 2 diabetes mellitus without complications: Secondary | ICD-10-CM | POA: Diagnosis not present

## 2016-11-12 DIAGNOSIS — N186 End stage renal disease: Secondary | ICD-10-CM | POA: Diagnosis not present

## 2016-11-14 DIAGNOSIS — N2581 Secondary hyperparathyroidism of renal origin: Secondary | ICD-10-CM | POA: Diagnosis not present

## 2016-11-14 DIAGNOSIS — E119 Type 2 diabetes mellitus without complications: Secondary | ICD-10-CM | POA: Diagnosis not present

## 2016-11-14 DIAGNOSIS — N186 End stage renal disease: Secondary | ICD-10-CM | POA: Diagnosis not present

## 2016-11-16 DIAGNOSIS — E119 Type 2 diabetes mellitus without complications: Secondary | ICD-10-CM | POA: Diagnosis not present

## 2016-11-16 DIAGNOSIS — N186 End stage renal disease: Secondary | ICD-10-CM | POA: Diagnosis not present

## 2016-11-16 DIAGNOSIS — N2581 Secondary hyperparathyroidism of renal origin: Secondary | ICD-10-CM | POA: Diagnosis not present

## 2016-11-18 ENCOUNTER — Ambulatory Visit (HOSPITAL_BASED_OUTPATIENT_CLINIC_OR_DEPARTMENT_OTHER): Payer: Medicare Other

## 2016-11-18 VITALS — BP 100/72 | HR 67 | Temp 98.1°F | Resp 18

## 2016-11-18 DIAGNOSIS — C8338 Diffuse large B-cell lymphoma, lymph nodes of multiple sites: Secondary | ICD-10-CM | POA: Diagnosis not present

## 2016-11-18 DIAGNOSIS — Z95828 Presence of other vascular implants and grafts: Secondary | ICD-10-CM

## 2016-11-18 DIAGNOSIS — Z23 Encounter for immunization: Secondary | ICD-10-CM

## 2016-11-18 MED ORDER — ALTEPLASE 2 MG IJ SOLR
2.0000 mg | Freq: Once | INTRAMUSCULAR | Status: DC | PRN
  Filled 2016-11-18: qty 2

## 2016-11-18 MED ORDER — HEPARIN SOD (PORK) LOCK FLUSH 100 UNIT/ML IV SOLN
500.0000 [IU] | Freq: Once | INTRAVENOUS | Status: AC | PRN
  Administered 2016-11-18: 500 [IU] via INTRAVENOUS
  Filled 2016-11-18: qty 5

## 2016-11-18 MED ORDER — SODIUM CHLORIDE 0.9 % IJ SOLN
10.0000 mL | INTRAMUSCULAR | Status: DC | PRN
  Administered 2016-11-18: 10 mL via INTRAVENOUS
  Filled 2016-11-18: qty 10

## 2016-11-18 MED ORDER — INFLUENZA VAC SPLIT HIGH-DOSE 0.5 ML IM SUSY
0.5000 mL | PREFILLED_SYRINGE | Freq: Once | INTRAMUSCULAR | Status: AC
  Administered 2016-11-18: 0.5 mL via INTRAMUSCULAR
  Filled 2016-11-18: qty 0.5

## 2016-11-19 DIAGNOSIS — E119 Type 2 diabetes mellitus without complications: Secondary | ICD-10-CM | POA: Diagnosis not present

## 2016-11-19 DIAGNOSIS — N2581 Secondary hyperparathyroidism of renal origin: Secondary | ICD-10-CM | POA: Diagnosis not present

## 2016-11-19 DIAGNOSIS — N186 End stage renal disease: Secondary | ICD-10-CM | POA: Diagnosis not present

## 2016-11-20 DIAGNOSIS — L84 Corns and callosities: Secondary | ICD-10-CM | POA: Diagnosis not present

## 2016-11-20 DIAGNOSIS — E1351 Other specified diabetes mellitus with diabetic peripheral angiopathy without gangrene: Secondary | ICD-10-CM | POA: Diagnosis not present

## 2016-11-20 DIAGNOSIS — L602 Onychogryphosis: Secondary | ICD-10-CM | POA: Diagnosis not present

## 2016-11-21 DIAGNOSIS — E119 Type 2 diabetes mellitus without complications: Secondary | ICD-10-CM | POA: Diagnosis not present

## 2016-11-21 DIAGNOSIS — N186 End stage renal disease: Secondary | ICD-10-CM | POA: Diagnosis not present

## 2016-11-21 DIAGNOSIS — N2581 Secondary hyperparathyroidism of renal origin: Secondary | ICD-10-CM | POA: Diagnosis not present

## 2016-11-23 DIAGNOSIS — N2581 Secondary hyperparathyroidism of renal origin: Secondary | ICD-10-CM | POA: Diagnosis not present

## 2016-11-23 DIAGNOSIS — E119 Type 2 diabetes mellitus without complications: Secondary | ICD-10-CM | POA: Diagnosis not present

## 2016-11-23 DIAGNOSIS — N186 End stage renal disease: Secondary | ICD-10-CM | POA: Diagnosis not present

## 2016-11-26 DIAGNOSIS — N186 End stage renal disease: Secondary | ICD-10-CM | POA: Diagnosis not present

## 2016-11-26 DIAGNOSIS — N2581 Secondary hyperparathyroidism of renal origin: Secondary | ICD-10-CM | POA: Diagnosis not present

## 2016-11-26 DIAGNOSIS — E119 Type 2 diabetes mellitus without complications: Secondary | ICD-10-CM | POA: Diagnosis not present

## 2016-11-27 DIAGNOSIS — Z992 Dependence on renal dialysis: Secondary | ICD-10-CM | POA: Diagnosis not present

## 2016-11-27 DIAGNOSIS — I129 Hypertensive chronic kidney disease with stage 1 through stage 4 chronic kidney disease, or unspecified chronic kidney disease: Secondary | ICD-10-CM | POA: Diagnosis not present

## 2016-11-27 DIAGNOSIS — N186 End stage renal disease: Secondary | ICD-10-CM | POA: Diagnosis not present

## 2016-11-28 DIAGNOSIS — N2581 Secondary hyperparathyroidism of renal origin: Secondary | ICD-10-CM | POA: Diagnosis not present

## 2016-11-28 DIAGNOSIS — N186 End stage renal disease: Secondary | ICD-10-CM | POA: Diagnosis not present

## 2016-11-28 DIAGNOSIS — E119 Type 2 diabetes mellitus without complications: Secondary | ICD-10-CM | POA: Diagnosis not present

## 2016-11-30 DIAGNOSIS — E119 Type 2 diabetes mellitus without complications: Secondary | ICD-10-CM | POA: Diagnosis not present

## 2016-11-30 DIAGNOSIS — N186 End stage renal disease: Secondary | ICD-10-CM | POA: Diagnosis not present

## 2016-11-30 DIAGNOSIS — N2581 Secondary hyperparathyroidism of renal origin: Secondary | ICD-10-CM | POA: Diagnosis not present

## 2016-12-03 DIAGNOSIS — N186 End stage renal disease: Secondary | ICD-10-CM | POA: Diagnosis not present

## 2016-12-03 DIAGNOSIS — E119 Type 2 diabetes mellitus without complications: Secondary | ICD-10-CM | POA: Diagnosis not present

## 2016-12-03 DIAGNOSIS — N2581 Secondary hyperparathyroidism of renal origin: Secondary | ICD-10-CM | POA: Diagnosis not present

## 2016-12-05 DIAGNOSIS — E119 Type 2 diabetes mellitus without complications: Secondary | ICD-10-CM | POA: Diagnosis not present

## 2016-12-05 DIAGNOSIS — N186 End stage renal disease: Secondary | ICD-10-CM | POA: Diagnosis not present

## 2016-12-05 DIAGNOSIS — N2581 Secondary hyperparathyroidism of renal origin: Secondary | ICD-10-CM | POA: Diagnosis not present

## 2016-12-07 DIAGNOSIS — N186 End stage renal disease: Secondary | ICD-10-CM | POA: Diagnosis not present

## 2016-12-07 DIAGNOSIS — N2581 Secondary hyperparathyroidism of renal origin: Secondary | ICD-10-CM | POA: Diagnosis not present

## 2016-12-07 DIAGNOSIS — E119 Type 2 diabetes mellitus without complications: Secondary | ICD-10-CM | POA: Diagnosis not present

## 2016-12-10 DIAGNOSIS — E119 Type 2 diabetes mellitus without complications: Secondary | ICD-10-CM | POA: Diagnosis not present

## 2016-12-10 DIAGNOSIS — N186 End stage renal disease: Secondary | ICD-10-CM | POA: Diagnosis not present

## 2016-12-10 DIAGNOSIS — N2581 Secondary hyperparathyroidism of renal origin: Secondary | ICD-10-CM | POA: Diagnosis not present

## 2016-12-12 DIAGNOSIS — E119 Type 2 diabetes mellitus without complications: Secondary | ICD-10-CM | POA: Diagnosis not present

## 2016-12-12 DIAGNOSIS — N186 End stage renal disease: Secondary | ICD-10-CM | POA: Diagnosis not present

## 2016-12-12 DIAGNOSIS — N2581 Secondary hyperparathyroidism of renal origin: Secondary | ICD-10-CM | POA: Diagnosis not present

## 2016-12-14 DIAGNOSIS — E119 Type 2 diabetes mellitus without complications: Secondary | ICD-10-CM | POA: Diagnosis not present

## 2016-12-14 DIAGNOSIS — N186 End stage renal disease: Secondary | ICD-10-CM | POA: Diagnosis not present

## 2016-12-14 DIAGNOSIS — N2581 Secondary hyperparathyroidism of renal origin: Secondary | ICD-10-CM | POA: Diagnosis not present

## 2016-12-16 DIAGNOSIS — N186 End stage renal disease: Secondary | ICD-10-CM | POA: Diagnosis not present

## 2016-12-16 DIAGNOSIS — N2581 Secondary hyperparathyroidism of renal origin: Secondary | ICD-10-CM | POA: Diagnosis not present

## 2016-12-16 DIAGNOSIS — E119 Type 2 diabetes mellitus without complications: Secondary | ICD-10-CM | POA: Diagnosis not present

## 2016-12-18 DIAGNOSIS — N2581 Secondary hyperparathyroidism of renal origin: Secondary | ICD-10-CM | POA: Diagnosis not present

## 2016-12-18 DIAGNOSIS — E119 Type 2 diabetes mellitus without complications: Secondary | ICD-10-CM | POA: Diagnosis not present

## 2016-12-18 DIAGNOSIS — N186 End stage renal disease: Secondary | ICD-10-CM | POA: Diagnosis not present

## 2016-12-21 DIAGNOSIS — E119 Type 2 diabetes mellitus without complications: Secondary | ICD-10-CM | POA: Diagnosis not present

## 2016-12-21 DIAGNOSIS — N186 End stage renal disease: Secondary | ICD-10-CM | POA: Diagnosis not present

## 2016-12-21 DIAGNOSIS — N2581 Secondary hyperparathyroidism of renal origin: Secondary | ICD-10-CM | POA: Diagnosis not present

## 2016-12-24 DIAGNOSIS — N2581 Secondary hyperparathyroidism of renal origin: Secondary | ICD-10-CM | POA: Diagnosis not present

## 2016-12-24 DIAGNOSIS — E119 Type 2 diabetes mellitus without complications: Secondary | ICD-10-CM | POA: Diagnosis not present

## 2016-12-24 DIAGNOSIS — N186 End stage renal disease: Secondary | ICD-10-CM | POA: Diagnosis not present

## 2016-12-26 DIAGNOSIS — N2581 Secondary hyperparathyroidism of renal origin: Secondary | ICD-10-CM | POA: Diagnosis not present

## 2016-12-26 DIAGNOSIS — E119 Type 2 diabetes mellitus without complications: Secondary | ICD-10-CM | POA: Diagnosis not present

## 2016-12-26 DIAGNOSIS — N186 End stage renal disease: Secondary | ICD-10-CM | POA: Diagnosis not present

## 2016-12-27 ENCOUNTER — Other Ambulatory Visit: Payer: Self-pay | Admitting: Hematology and Oncology

## 2016-12-27 DIAGNOSIS — C8338 Diffuse large B-cell lymphoma, lymph nodes of multiple sites: Secondary | ICD-10-CM

## 2016-12-27 DIAGNOSIS — N186 End stage renal disease: Secondary | ICD-10-CM | POA: Diagnosis not present

## 2016-12-27 DIAGNOSIS — Z992 Dependence on renal dialysis: Secondary | ICD-10-CM | POA: Diagnosis not present

## 2016-12-27 DIAGNOSIS — I129 Hypertensive chronic kidney disease with stage 1 through stage 4 chronic kidney disease, or unspecified chronic kidney disease: Secondary | ICD-10-CM | POA: Diagnosis not present

## 2016-12-28 DIAGNOSIS — E119 Type 2 diabetes mellitus without complications: Secondary | ICD-10-CM | POA: Diagnosis not present

## 2016-12-28 DIAGNOSIS — N2581 Secondary hyperparathyroidism of renal origin: Secondary | ICD-10-CM | POA: Diagnosis not present

## 2016-12-28 DIAGNOSIS — N186 End stage renal disease: Secondary | ICD-10-CM | POA: Diagnosis not present

## 2016-12-30 ENCOUNTER — Other Ambulatory Visit: Payer: Self-pay | Admitting: Hematology and Oncology

## 2016-12-30 ENCOUNTER — Telehealth: Payer: Self-pay | Admitting: Hematology and Oncology

## 2016-12-30 ENCOUNTER — Ambulatory Visit (HOSPITAL_BASED_OUTPATIENT_CLINIC_OR_DEPARTMENT_OTHER): Payer: Medicare Other | Admitting: Hematology and Oncology

## 2016-12-30 ENCOUNTER — Other Ambulatory Visit (HOSPITAL_BASED_OUTPATIENT_CLINIC_OR_DEPARTMENT_OTHER): Payer: Medicare Other

## 2016-12-30 ENCOUNTER — Encounter: Payer: Self-pay | Admitting: Hematology and Oncology

## 2016-12-30 ENCOUNTER — Ambulatory Visit (HOSPITAL_BASED_OUTPATIENT_CLINIC_OR_DEPARTMENT_OTHER): Payer: Medicare Other

## 2016-12-30 VITALS — BP 112/70 | HR 60 | Temp 98.2°F | Resp 18 | Ht 71.0 in | Wt 196.4 lb

## 2016-12-30 DIAGNOSIS — D631 Anemia in chronic kidney disease: Secondary | ICD-10-CM | POA: Diagnosis not present

## 2016-12-30 DIAGNOSIS — C8338 Diffuse large B-cell lymphoma, lymph nodes of multiple sites: Secondary | ICD-10-CM | POA: Diagnosis not present

## 2016-12-30 DIAGNOSIS — N186 End stage renal disease: Secondary | ICD-10-CM

## 2016-12-30 DIAGNOSIS — Z992 Dependence on renal dialysis: Secondary | ICD-10-CM

## 2016-12-30 DIAGNOSIS — Z95828 Presence of other vascular implants and grafts: Secondary | ICD-10-CM

## 2016-12-30 LAB — CBC WITH DIFFERENTIAL/PLATELET
BASO%: 1.3 % (ref 0.0–2.0)
Basophils Absolute: 0.1 10*3/uL (ref 0.0–0.1)
EOS ABS: 0.8 10*3/uL — AB (ref 0.0–0.5)
EOS%: 13 % — ABNORMAL HIGH (ref 0.0–7.0)
HCT: 38.5 % (ref 38.4–49.9)
HGB: 12.7 g/dL — ABNORMAL LOW (ref 13.0–17.1)
LYMPH%: 22.8 % (ref 14.0–49.0)
MCH: 32.7 pg (ref 27.2–33.4)
MCHC: 33 g/dL (ref 32.0–36.0)
MCV: 99.3 fL — AB (ref 79.3–98.0)
MONO#: 0.8 10*3/uL (ref 0.1–0.9)
MONO%: 12.4 % (ref 0.0–14.0)
NEUT%: 50.5 % (ref 39.0–75.0)
NEUTROS ABS: 3.3 10*3/uL (ref 1.5–6.5)
Platelets: 195 10*3/uL (ref 140–400)
RBC: 3.88 10*6/uL — AB (ref 4.20–5.82)
RDW: 15.7 % — ABNORMAL HIGH (ref 11.0–14.6)
WBC: 6.5 10*3/uL (ref 4.0–10.3)
lymph#: 1.5 10*3/uL (ref 0.9–3.3)

## 2016-12-30 LAB — LACTATE DEHYDROGENASE: LDH: 162 U/L (ref 125–245)

## 2016-12-30 MED ORDER — HEPARIN SOD (PORK) LOCK FLUSH 100 UNIT/ML IV SOLN
500.0000 [IU] | Freq: Once | INTRAVENOUS | Status: AC | PRN
  Administered 2016-12-30: 500 [IU] via INTRAVENOUS
  Filled 2016-12-30: qty 5

## 2016-12-30 MED ORDER — SODIUM CHLORIDE 0.9 % IJ SOLN
10.0000 mL | INTRAMUSCULAR | Status: DC | PRN
  Administered 2016-12-30: 10 mL via INTRAVENOUS
  Filled 2016-12-30: qty 10

## 2016-12-30 NOTE — Patient Instructions (Signed)

## 2016-12-30 NOTE — Progress Notes (Signed)
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 [facility])   06/07/2015 Initial Diagnosis    Non-Hodgkin lymphoma of intra-abdominal lymph nodes ([place])      06/12/2015 Imaging    CT chest showed lymphadenopathy within the low neck and low chest, consistent with active lymphoma      06/14/2015 Procedure    He underwent CT-guided biopsy      06/14/2015 Pathology Results    Biopsy of the retroperitoneal mass confirmed diffuse large B-cell lymphoma      06/21/2015 Imaging    ECHO showed normal EF      06/21/2015 Procedure    He has port placement      06/23/2015 - 10/12/2015 Chemotherapy    He received mini-RCHOP chemo x 6 cycles      08/25/2015 PET scan    Significant interval decrease in size of the bulky retroperitoneal lymphadenopathy suggesting an excellent response to therapy. Residual matted soft tissue density in the retroperitoneum with SUV max of approximately 3.3      11/27/2015 PET scan    Continued improved appearance of the treated lymphoma. Minimal residual matted soft tissue density but no discrete measurable disease and no hypermetabolism to suggest metabolically active tumor.      05/24/2016 Imaging    Unchanged retroperitoneal soft tissue most compatible with history of treated lymphoma. No new or enlarging adenopathy in the chest, abdomen or pelvis. Polycystic renal disease. Bilateral fat containing inguinal hernias. Aortic atherosclerosis.       INTERVAL HISTORY: Please see below for problem oriented charting. He returns for further follow-up He is doing well Denies recent infection No new lymphadenopathy His skin rash is improving  REVIEW OF SYSTEMS:    Constitutional: Denies fevers, chills or abnormal weight loss Eyes: Denies blurriness of vision Ears, nose, mouth, throat, and face: Denies mucositis or sore throat Respiratory: Denies cough, dyspnea or wheezes Cardiovascular: Denies palpitation, chest discomfort or lower extremity swelling Gastrointestinal:  Denies nausea, heartburn or change in bowel habits Skin: Denies abnormal skin rashes Lymphatics: Denies new lymphadenopathy or easy bruising Neurological:Denies numbness, tingling or new weaknesses Behavioral/Psych: Mood is stable, no new changes  All other systems were reviewed with the patient and are negative.  I have reviewed the past medical history, past surgical history, social history and family history with the patient and they are unchanged from previous note.  ALLERGIES:  is allergic to tape.  MEDICATIONS:  Current Outpatient Medications  Medication Sig Dispense Refill  . aspirin 325 MG tablet Take 1 tablet (325 mg total) by mouth daily. 30 tablet 0  . atorvastatin (LIPITOR) 10 MG tablet Take 10 mg by mouth daily.  0  . calcium acetate (PHOSLO) 667 MG capsule Take 1 capsule by mouth 2 (two) times daily.   11  . clobetasol ointment (TEMOVATE) 9.76 % Apply 1 application topically 2 (two) times daily.    . cloNIDine (CATAPRES) 0.2 MG tablet Take 0.4 mg by mouth 2 (two) times daily.     Marland Kitchen docusate sodium (COLACE) 100 MG capsule Take 1 capsule (100 mg total) by mouth every 12 (twelve) hours. (Patient taking differently: Take 100 mg by mouth every 12 (twelve) hours. ) 60 capsule 0  . hydrOXYzine (ATARAX/VISTARIL) 10 MG  tablet Take 10 mg by mouth daily.    . isosorbide-hydrALAZINE (BIDIL) 20-37.5 MG tablet Take 0.5 tablets by mouth 2 (two) times daily.    Marland Kitchen levETIRAcetam (KEPPRA) 500 MG tablet Take 1 tablet (500 mg total) by mouth 2 (two) times daily. 60 tablet 6  . levothyroxine (SYNTHROID, LEVOTHROID) 25 MCG tablet Take 25 mcg by mouth daily.   0  . lidocaine-prilocaine  (EMLA) cream Apply to affected area once 30 g 3  . metoprolol (LOPRESSOR) 100 MG tablet Take 150 mg by mouth 2 (two) times daily.     . multivitamin (RENA-VIT) TABS tablet Take 1 tablet by mouth at bedtime. 90 tablet 0   No current facility-administered medications for this visit.     PHYSICAL EXAMINATION: ECOG PERFORMANCE STATUS: 1 - Symptomatic but completely ambulatory  Vitals:   12/30/16 1052  BP: 112/70  Pulse: 60  Resp: 18  Temp: 98.2 F (36.8 C)  SpO2: 97%   Filed Weights   12/30/16 1052  Weight: 196 lb 6.4 oz (89.1 kg)    GENERAL:alert, no distress and comfortable SKIN: skin color, texture, turgor are normal, no rashes or significant lesions EYES: normal, Conjunctiva are pink and non-injected, sclera clear OROPHARYNX:no exudate, no erythema and lips, buccal mucosa, and tongue normal  NECK: supple, thyroid normal size, non-tender, without nodularity LYMPH:  no palpable lymphadenopathy in the cervical, axillary or inguinal LUNGS: clear to auscultation and percussion with normal breathing effort HEART: regular rate & rhythm and no murmurs and no lower extremity edema ABDOMEN:abdomen soft, non-tender and normal bowel sounds Musculoskeletal:no cyanosis of digits and no clubbing  NEURO: alert & oriented x 3 with fluent speech, 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  LABORATORY DATA:  I have reviewed the data as listed    Component Value Date/Time   NA 138 07/08/2016 1052   K 4.3 07/08/2016 1052   CL 95 (L) 06/21/2015 1345   CO2 29 07/08/2016 1052   GLUCOSE 87 07/08/2016 1052   BUN 42.7 (H) 07/08/2016 1052   CREATININE 8.7 (HH) 07/08/2016 1052   CALCIUM 9.6 07/08/2016 1052   PROT 6.7 07/08/2016 1052   ALBUMIN 3.1 (L) 07/08/2016 1052   AST 15 07/08/2016 1052   ALT 14 07/08/2016 1052   ALKPHOS 62 07/08/2016 1052   BILITOT 0.41 07/08/2016 1052   GFRNONAA 8 (L) 06/21/2015 1345   GFRAA 9 (L) 06/21/2015 1345    No results found for: SPEP, UPEP  Lab  Results  Component Value Date   WBC 6.5 12/30/2016   NEUTROABS 3.3 12/30/2016   HGB 12.7 (L) 12/30/2016   HCT 38.5 12/30/2016   MCV 99.3 (H) 12/30/2016   PLT 195 12/30/2016      Chemistry      Component Value Date/Time   NA 138 07/08/2016 1052   K 4.3 07/08/2016 1052   CL 95 (L) 06/21/2015 1345   CO2 29 07/08/2016 1052   BUN 42.7 (H) 07/08/2016 1052   CREATININE 8.7 (HH) 07/08/2016 1052      Component Value Date/Time   CALCIUM 9.6 07/08/2016 1052   ALKPHOS 62 07/08/2016 1052   AST 15 07/08/2016 1052   ALT 14 07/08/2016 1052   BILITOT 0.41 07/08/2016 1052       ASSESSMENT & PLAN:  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 I recommend observation moving forward with repeat blood work, history and examination in 3 months  I plan to order CT scan imaging. If CT scan is negative, I will get his port removed  Anemia in chronic kidney disease This is likely anemia of chronic disease. The patient denies recent history of bleeding such as epistaxis, hematuria or hematochezia. He is asymptomatic from the anemia. We will observe for now.  He does not require transfusion now.  I would defer ESA management to nephrologist.  End stage renal failure on dialysis [facility]) Patient is receiving hemodialysis on Tuesdays, Thursdays and Saturdays He is wondering about the role of kidney transplant I told him he is not consider a long-term cancer survivor until he reached a 5 years' mark after last dose of chemo   Orders Placed This Encounter  Procedures  . CT Abdomen Pelvis Wo Contrast    Standing Status:   Future    Standing Expiration Date:   12/30/2017    Order Specific Question:   Preferred imaging location?    Answer:   [facility]    Order  Specific Question:   Radiology Contrast Protocol - do NOT remove file path    Answer:   [email or web address]  . CT Chest Wo Contrast    Standing Status:   Future    Standing Expiration Date:   12/30/2017    Order Specific Question:   Preferred imaging location?    Answer:   [facility]    Order Specific Question:   Radiology Contrast Protocol - do NOT remove file path    Answer:   [email or web address]   All questions were answered. The patient knows to call the clinic with any problems, questions or concerns. No barriers to learning was detected. I spent 15 minutes counseling the patient face to face. The total time spent in the appointment was 20 minutes and more than 50% was on counseling and review of test results     Heath Lark, MD 12/30/2016 12:19 PM

## 2016-12-30 NOTE — Assessment & Plan Note (Signed)
Patient is receiving hemodialysis on Tuesdays, Thursdays and Saturdays He is wondering about the role of kidney transplant I told him he is not consider a long-term cancer survivor until he reached a 5 years' mark after last dose of chemo 

## 2016-12-30 NOTE — Telephone Encounter (Signed)
Gave avs and calendar for March 2019 °

## 2016-12-30 NOTE — Assessment & Plan Note (Signed)
His last PET CT scan from October 2017 show complete response to treatment We reviewed recent imaging study of CT dated 05/24/2016 which showed no evidence of disease We reviewed the current guidelines. I recommend observation moving forward with repeat blood work, history and examination in 3 months  I plan to order CT scan imaging. If CT scan is negative, I will get his port removed

## 2016-12-30 NOTE — Assessment & Plan Note (Signed)
This is likely anemia of chronic disease. The patient denies recent history of bleeding such as epistaxis, hematuria or hematochezia. He is asymptomatic from the anemia. We will observe for now.  He does not require transfusion now.  I would defer ESA management to nephrologist.

## 2016-12-31 DIAGNOSIS — N2581 Secondary hyperparathyroidism of renal origin: Secondary | ICD-10-CM | POA: Diagnosis not present

## 2016-12-31 DIAGNOSIS — E119 Type 2 diabetes mellitus without complications: Secondary | ICD-10-CM | POA: Diagnosis not present

## 2016-12-31 DIAGNOSIS — N186 End stage renal disease: Secondary | ICD-10-CM | POA: Diagnosis not present

## 2016-12-31 LAB — HEPATIC FUNCTION PANEL
ALK PHOS: 72 IU/L (ref 39–117)
ALT: 22 IU/L (ref 0–44)
AST (SGOT): 24 IU/L (ref 0–40)
Albumin, Serum: 4.4 g/dL (ref 3.6–4.8)
BILIRUBIN, DIRECT: 0.09 mg/dL (ref 0.00–0.40)
Bilirubin Total: 0.3 mg/dL (ref 0.0–1.2)
TOTAL PROTEIN: 7.4 g/dL (ref 6.0–8.5)

## 2017-01-02 DIAGNOSIS — E119 Type 2 diabetes mellitus without complications: Secondary | ICD-10-CM | POA: Diagnosis not present

## 2017-01-02 DIAGNOSIS — N2581 Secondary hyperparathyroidism of renal origin: Secondary | ICD-10-CM | POA: Diagnosis not present

## 2017-01-02 DIAGNOSIS — N186 End stage renal disease: Secondary | ICD-10-CM | POA: Diagnosis not present

## 2017-01-04 DIAGNOSIS — E119 Type 2 diabetes mellitus without complications: Secondary | ICD-10-CM | POA: Diagnosis not present

## 2017-01-04 DIAGNOSIS — N186 End stage renal disease: Secondary | ICD-10-CM | POA: Diagnosis not present

## 2017-01-04 DIAGNOSIS — N2581 Secondary hyperparathyroidism of renal origin: Secondary | ICD-10-CM | POA: Diagnosis not present

## 2017-01-07 DIAGNOSIS — N2581 Secondary hyperparathyroidism of renal origin: Secondary | ICD-10-CM | POA: Diagnosis not present

## 2017-01-07 DIAGNOSIS — E119 Type 2 diabetes mellitus without complications: Secondary | ICD-10-CM | POA: Diagnosis not present

## 2017-01-07 DIAGNOSIS — N186 End stage renal disease: Secondary | ICD-10-CM | POA: Diagnosis not present

## 2017-01-09 DIAGNOSIS — E119 Type 2 diabetes mellitus without complications: Secondary | ICD-10-CM | POA: Diagnosis not present

## 2017-01-09 DIAGNOSIS — N186 End stage renal disease: Secondary | ICD-10-CM | POA: Diagnosis not present

## 2017-01-09 DIAGNOSIS — N2581 Secondary hyperparathyroidism of renal origin: Secondary | ICD-10-CM | POA: Diagnosis not present

## 2017-01-11 DIAGNOSIS — N186 End stage renal disease: Secondary | ICD-10-CM | POA: Diagnosis not present

## 2017-01-11 DIAGNOSIS — N2581 Secondary hyperparathyroidism of renal origin: Secondary | ICD-10-CM | POA: Diagnosis not present

## 2017-01-11 DIAGNOSIS — E119 Type 2 diabetes mellitus without complications: Secondary | ICD-10-CM | POA: Diagnosis not present

## 2017-01-14 DIAGNOSIS — E119 Type 2 diabetes mellitus without complications: Secondary | ICD-10-CM | POA: Diagnosis not present

## 2017-01-14 DIAGNOSIS — N186 End stage renal disease: Secondary | ICD-10-CM | POA: Diagnosis not present

## 2017-01-14 DIAGNOSIS — N2581 Secondary hyperparathyroidism of renal origin: Secondary | ICD-10-CM | POA: Diagnosis not present

## 2017-01-15 ENCOUNTER — Ambulatory Visit (HOSPITAL_COMMUNITY)
Admission: RE | Admit: 2017-01-15 | Discharge: 2017-01-15 | Disposition: A | Discharge status: Home / Self Care | Payer: Medicare Other | Source: Ambulatory Visit | Attending: Hematology and Oncology | Admitting: Hematology and Oncology

## 2017-01-15 ENCOUNTER — Encounter (HOSPITAL_COMMUNITY): Payer: Self-pay

## 2017-01-15 DIAGNOSIS — Q613 Polycystic kidney, unspecified: Secondary | ICD-10-CM | POA: Diagnosis not present

## 2017-01-15 DIAGNOSIS — N400 Enlarged prostate without lower urinary tract symptoms: Secondary | ICD-10-CM | POA: Insufficient documentation

## 2017-01-15 DIAGNOSIS — R918 Other nonspecific abnormal finding of lung field: Secondary | ICD-10-CM | POA: Diagnosis not present

## 2017-01-15 DIAGNOSIS — K5790 Diverticulosis of intestine, part unspecified, without perforation or abscess without bleeding: Secondary | ICD-10-CM | POA: Diagnosis not present

## 2017-01-15 DIAGNOSIS — C8338 Diffuse large B-cell lymphoma, lymph nodes of multiple sites: Secondary | ICD-10-CM

## 2017-01-15 DIAGNOSIS — I700 Atherosclerosis of aorta: Secondary | ICD-10-CM | POA: Insufficient documentation

## 2017-01-15 DIAGNOSIS — K449 Diaphragmatic hernia without obstruction or gangrene: Secondary | ICD-10-CM | POA: Insufficient documentation

## 2017-01-16 DIAGNOSIS — N186 End stage renal disease: Secondary | ICD-10-CM | POA: Diagnosis not present

## 2017-01-16 DIAGNOSIS — E119 Type 2 diabetes mellitus without complications: Secondary | ICD-10-CM | POA: Diagnosis not present

## 2017-01-16 DIAGNOSIS — N2581 Secondary hyperparathyroidism of renal origin: Secondary | ICD-10-CM | POA: Diagnosis not present

## 2017-01-18 DIAGNOSIS — N2581 Secondary hyperparathyroidism of renal origin: Secondary | ICD-10-CM | POA: Diagnosis not present

## 2017-01-18 DIAGNOSIS — N186 End stage renal disease: Secondary | ICD-10-CM | POA: Diagnosis not present

## 2017-01-18 DIAGNOSIS — E119 Type 2 diabetes mellitus without complications: Secondary | ICD-10-CM | POA: Diagnosis not present

## 2017-01-20 DIAGNOSIS — E119 Type 2 diabetes mellitus without complications: Secondary | ICD-10-CM | POA: Diagnosis not present

## 2017-01-20 DIAGNOSIS — N186 End stage renal disease: Secondary | ICD-10-CM | POA: Diagnosis not present

## 2017-01-20 DIAGNOSIS — N2581 Secondary hyperparathyroidism of renal origin: Secondary | ICD-10-CM | POA: Diagnosis not present

## 2017-01-23 DIAGNOSIS — E119 Type 2 diabetes mellitus without complications: Secondary | ICD-10-CM | POA: Diagnosis not present

## 2017-01-23 DIAGNOSIS — N186 End stage renal disease: Secondary | ICD-10-CM | POA: Diagnosis not present

## 2017-01-23 DIAGNOSIS — N2581 Secondary hyperparathyroidism of renal origin: Secondary | ICD-10-CM | POA: Diagnosis not present

## 2017-01-25 DIAGNOSIS — E119 Type 2 diabetes mellitus without complications: Secondary | ICD-10-CM | POA: Diagnosis not present

## 2017-01-25 DIAGNOSIS — N2581 Secondary hyperparathyroidism of renal origin: Secondary | ICD-10-CM | POA: Diagnosis not present

## 2017-01-25 DIAGNOSIS — N186 End stage renal disease: Secondary | ICD-10-CM | POA: Diagnosis not present

## 2017-01-27 DIAGNOSIS — E119 Type 2 diabetes mellitus without complications: Secondary | ICD-10-CM | POA: Diagnosis not present

## 2017-01-27 DIAGNOSIS — I129 Hypertensive chronic kidney disease with stage 1 through stage 4 chronic kidney disease, or unspecified chronic kidney disease: Secondary | ICD-10-CM | POA: Diagnosis not present

## 2017-01-27 DIAGNOSIS — Z992 Dependence on renal dialysis: Secondary | ICD-10-CM | POA: Diagnosis not present

## 2017-01-27 DIAGNOSIS — N186 End stage renal disease: Secondary | ICD-10-CM | POA: Diagnosis not present

## 2017-01-27 DIAGNOSIS — N2581 Secondary hyperparathyroidism of renal origin: Secondary | ICD-10-CM | POA: Diagnosis not present

## 2017-01-29 DIAGNOSIS — L602 Onychogryphosis: Secondary | ICD-10-CM | POA: Diagnosis not present

## 2017-01-29 DIAGNOSIS — E1351 Other specified diabetes mellitus with diabetic peripheral angiopathy without gangrene: Secondary | ICD-10-CM | POA: Diagnosis not present

## 2017-01-29 DIAGNOSIS — L84 Corns and callosities: Secondary | ICD-10-CM | POA: Diagnosis not present

## 2017-01-30 DIAGNOSIS — Z992 Dependence on renal dialysis: Secondary | ICD-10-CM | POA: Diagnosis not present

## 2017-01-30 DIAGNOSIS — E119 Type 2 diabetes mellitus without complications: Secondary | ICD-10-CM | POA: Diagnosis not present

## 2017-01-30 DIAGNOSIS — N186 End stage renal disease: Secondary | ICD-10-CM | POA: Diagnosis not present

## 2017-01-30 DIAGNOSIS — D631 Anemia in chronic kidney disease: Secondary | ICD-10-CM | POA: Diagnosis not present

## 2017-01-30 DIAGNOSIS — N2581 Secondary hyperparathyroidism of renal origin: Secondary | ICD-10-CM | POA: Diagnosis not present

## 2017-02-01 DIAGNOSIS — N186 End stage renal disease: Secondary | ICD-10-CM | POA: Diagnosis not present

## 2017-02-01 DIAGNOSIS — D631 Anemia in chronic kidney disease: Secondary | ICD-10-CM | POA: Diagnosis not present

## 2017-02-01 DIAGNOSIS — Z992 Dependence on renal dialysis: Secondary | ICD-10-CM | POA: Diagnosis not present

## 2017-02-01 DIAGNOSIS — E119 Type 2 diabetes mellitus without complications: Secondary | ICD-10-CM | POA: Diagnosis not present

## 2017-02-01 DIAGNOSIS — N2581 Secondary hyperparathyroidism of renal origin: Secondary | ICD-10-CM | POA: Diagnosis not present

## 2017-02-04 DIAGNOSIS — Z992 Dependence on renal dialysis: Secondary | ICD-10-CM | POA: Diagnosis not present

## 2017-02-04 DIAGNOSIS — N186 End stage renal disease: Secondary | ICD-10-CM | POA: Diagnosis not present

## 2017-02-04 DIAGNOSIS — N2581 Secondary hyperparathyroidism of renal origin: Secondary | ICD-10-CM | POA: Diagnosis not present

## 2017-02-04 DIAGNOSIS — E119 Type 2 diabetes mellitus without complications: Secondary | ICD-10-CM | POA: Diagnosis not present

## 2017-02-04 DIAGNOSIS — D631 Anemia in chronic kidney disease: Secondary | ICD-10-CM | POA: Diagnosis not present

## 2017-02-06 DIAGNOSIS — N186 End stage renal disease: Secondary | ICD-10-CM | POA: Diagnosis not present

## 2017-02-06 DIAGNOSIS — D631 Anemia in chronic kidney disease: Secondary | ICD-10-CM | POA: Diagnosis not present

## 2017-02-06 DIAGNOSIS — N2581 Secondary hyperparathyroidism of renal origin: Secondary | ICD-10-CM | POA: Diagnosis not present

## 2017-02-06 DIAGNOSIS — E119 Type 2 diabetes mellitus without complications: Secondary | ICD-10-CM | POA: Diagnosis not present

## 2017-02-06 DIAGNOSIS — Z992 Dependence on renal dialysis: Secondary | ICD-10-CM | POA: Diagnosis not present

## 2017-02-08 DIAGNOSIS — N186 End stage renal disease: Secondary | ICD-10-CM | POA: Diagnosis not present

## 2017-02-08 DIAGNOSIS — Z992 Dependence on renal dialysis: Secondary | ICD-10-CM | POA: Diagnosis not present

## 2017-02-08 DIAGNOSIS — N2581 Secondary hyperparathyroidism of renal origin: Secondary | ICD-10-CM | POA: Diagnosis not present

## 2017-02-08 DIAGNOSIS — E119 Type 2 diabetes mellitus without complications: Secondary | ICD-10-CM | POA: Diagnosis not present

## 2017-02-08 DIAGNOSIS — D631 Anemia in chronic kidney disease: Secondary | ICD-10-CM | POA: Diagnosis not present

## 2017-02-11 DIAGNOSIS — D631 Anemia in chronic kidney disease: Secondary | ICD-10-CM | POA: Diagnosis not present

## 2017-02-11 DIAGNOSIS — N2581 Secondary hyperparathyroidism of renal origin: Secondary | ICD-10-CM | POA: Diagnosis not present

## 2017-02-11 DIAGNOSIS — N186 End stage renal disease: Secondary | ICD-10-CM | POA: Diagnosis not present

## 2017-02-11 DIAGNOSIS — Z992 Dependence on renal dialysis: Secondary | ICD-10-CM | POA: Diagnosis not present

## 2017-02-11 DIAGNOSIS — E119 Type 2 diabetes mellitus without complications: Secondary | ICD-10-CM | POA: Diagnosis not present

## 2017-02-13 DIAGNOSIS — Z992 Dependence on renal dialysis: Secondary | ICD-10-CM | POA: Diagnosis not present

## 2017-02-13 DIAGNOSIS — N186 End stage renal disease: Secondary | ICD-10-CM | POA: Diagnosis not present

## 2017-02-13 DIAGNOSIS — D631 Anemia in chronic kidney disease: Secondary | ICD-10-CM | POA: Diagnosis not present

## 2017-02-13 DIAGNOSIS — N2581 Secondary hyperparathyroidism of renal origin: Secondary | ICD-10-CM | POA: Diagnosis not present

## 2017-02-13 DIAGNOSIS — E119 Type 2 diabetes mellitus without complications: Secondary | ICD-10-CM | POA: Diagnosis not present

## 2017-02-15 DIAGNOSIS — D631 Anemia in chronic kidney disease: Secondary | ICD-10-CM | POA: Diagnosis not present

## 2017-02-15 DIAGNOSIS — N186 End stage renal disease: Secondary | ICD-10-CM | POA: Diagnosis not present

## 2017-02-15 DIAGNOSIS — N2581 Secondary hyperparathyroidism of renal origin: Secondary | ICD-10-CM | POA: Diagnosis not present

## 2017-02-15 DIAGNOSIS — Z992 Dependence on renal dialysis: Secondary | ICD-10-CM | POA: Diagnosis not present

## 2017-02-15 DIAGNOSIS — E119 Type 2 diabetes mellitus without complications: Secondary | ICD-10-CM | POA: Diagnosis not present

## 2017-02-18 DIAGNOSIS — N2581 Secondary hyperparathyroidism of renal origin: Secondary | ICD-10-CM | POA: Diagnosis not present

## 2017-02-18 DIAGNOSIS — E119 Type 2 diabetes mellitus without complications: Secondary | ICD-10-CM | POA: Diagnosis not present

## 2017-02-18 DIAGNOSIS — D631 Anemia in chronic kidney disease: Secondary | ICD-10-CM | POA: Diagnosis not present

## 2017-02-18 DIAGNOSIS — Z992 Dependence on renal dialysis: Secondary | ICD-10-CM | POA: Diagnosis not present

## 2017-02-18 DIAGNOSIS — N186 End stage renal disease: Secondary | ICD-10-CM | POA: Diagnosis not present

## 2017-02-20 DIAGNOSIS — D631 Anemia in chronic kidney disease: Secondary | ICD-10-CM | POA: Diagnosis not present

## 2017-02-20 DIAGNOSIS — E119 Type 2 diabetes mellitus without complications: Secondary | ICD-10-CM | POA: Diagnosis not present

## 2017-02-20 DIAGNOSIS — N186 End stage renal disease: Secondary | ICD-10-CM | POA: Diagnosis not present

## 2017-02-20 DIAGNOSIS — N2581 Secondary hyperparathyroidism of renal origin: Secondary | ICD-10-CM | POA: Diagnosis not present

## 2017-02-20 DIAGNOSIS — Z992 Dependence on renal dialysis: Secondary | ICD-10-CM | POA: Diagnosis not present

## 2017-02-22 DIAGNOSIS — D631 Anemia in chronic kidney disease: Secondary | ICD-10-CM | POA: Diagnosis not present

## 2017-02-22 DIAGNOSIS — N2581 Secondary hyperparathyroidism of renal origin: Secondary | ICD-10-CM | POA: Diagnosis not present

## 2017-02-22 DIAGNOSIS — N186 End stage renal disease: Secondary | ICD-10-CM | POA: Diagnosis not present

## 2017-02-22 DIAGNOSIS — E119 Type 2 diabetes mellitus without complications: Secondary | ICD-10-CM | POA: Diagnosis not present

## 2017-02-22 DIAGNOSIS — Z992 Dependence on renal dialysis: Secondary | ICD-10-CM | POA: Diagnosis not present

## 2017-02-25 DIAGNOSIS — Z992 Dependence on renal dialysis: Secondary | ICD-10-CM | POA: Diagnosis not present

## 2017-02-25 DIAGNOSIS — E119 Type 2 diabetes mellitus without complications: Secondary | ICD-10-CM | POA: Diagnosis not present

## 2017-02-25 DIAGNOSIS — N2581 Secondary hyperparathyroidism of renal origin: Secondary | ICD-10-CM | POA: Diagnosis not present

## 2017-02-25 DIAGNOSIS — N186 End stage renal disease: Secondary | ICD-10-CM | POA: Diagnosis not present

## 2017-02-25 DIAGNOSIS — D631 Anemia in chronic kidney disease: Secondary | ICD-10-CM | POA: Diagnosis not present

## 2017-02-27 DIAGNOSIS — D631 Anemia in chronic kidney disease: Secondary | ICD-10-CM | POA: Diagnosis not present

## 2017-02-27 DIAGNOSIS — I129 Hypertensive chronic kidney disease with stage 1 through stage 4 chronic kidney disease, or unspecified chronic kidney disease: Secondary | ICD-10-CM | POA: Diagnosis not present

## 2017-02-27 DIAGNOSIS — E119 Type 2 diabetes mellitus without complications: Secondary | ICD-10-CM | POA: Diagnosis not present

## 2017-02-27 DIAGNOSIS — Z992 Dependence on renal dialysis: Secondary | ICD-10-CM | POA: Diagnosis not present

## 2017-02-27 DIAGNOSIS — N186 End stage renal disease: Secondary | ICD-10-CM | POA: Diagnosis not present

## 2017-02-27 DIAGNOSIS — N2581 Secondary hyperparathyroidism of renal origin: Secondary | ICD-10-CM | POA: Diagnosis not present

## 2017-02-28 DIAGNOSIS — Z992 Dependence on renal dialysis: Secondary | ICD-10-CM | POA: Diagnosis not present

## 2017-02-28 DIAGNOSIS — N186 End stage renal disease: Secondary | ICD-10-CM | POA: Diagnosis not present

## 2017-02-28 DIAGNOSIS — I129 Hypertensive chronic kidney disease with stage 1 through stage 4 chronic kidney disease, or unspecified chronic kidney disease: Secondary | ICD-10-CM | POA: Diagnosis not present

## 2017-03-01 DIAGNOSIS — E119 Type 2 diabetes mellitus without complications: Secondary | ICD-10-CM | POA: Diagnosis not present

## 2017-03-01 DIAGNOSIS — Z992 Dependence on renal dialysis: Secondary | ICD-10-CM | POA: Diagnosis not present

## 2017-03-01 DIAGNOSIS — N186 End stage renal disease: Secondary | ICD-10-CM | POA: Diagnosis not present

## 2017-03-01 DIAGNOSIS — N2581 Secondary hyperparathyroidism of renal origin: Secondary | ICD-10-CM | POA: Diagnosis not present

## 2017-03-04 DIAGNOSIS — N2581 Secondary hyperparathyroidism of renal origin: Secondary | ICD-10-CM | POA: Diagnosis not present

## 2017-03-04 DIAGNOSIS — E119 Type 2 diabetes mellitus without complications: Secondary | ICD-10-CM | POA: Diagnosis not present

## 2017-03-04 DIAGNOSIS — N186 End stage renal disease: Secondary | ICD-10-CM | POA: Diagnosis not present

## 2017-03-04 DIAGNOSIS — Z992 Dependence on renal dialysis: Secondary | ICD-10-CM | POA: Diagnosis not present

## 2017-03-06 DIAGNOSIS — N2581 Secondary hyperparathyroidism of renal origin: Secondary | ICD-10-CM | POA: Diagnosis not present

## 2017-03-06 DIAGNOSIS — E119 Type 2 diabetes mellitus without complications: Secondary | ICD-10-CM | POA: Diagnosis not present

## 2017-03-06 DIAGNOSIS — Z992 Dependence on renal dialysis: Secondary | ICD-10-CM | POA: Diagnosis not present

## 2017-03-06 DIAGNOSIS — N186 End stage renal disease: Secondary | ICD-10-CM | POA: Diagnosis not present

## 2017-03-08 DIAGNOSIS — E119 Type 2 diabetes mellitus without complications: Secondary | ICD-10-CM | POA: Diagnosis not present

## 2017-03-08 DIAGNOSIS — Z992 Dependence on renal dialysis: Secondary | ICD-10-CM | POA: Diagnosis not present

## 2017-03-08 DIAGNOSIS — N186 End stage renal disease: Secondary | ICD-10-CM | POA: Diagnosis not present

## 2017-03-08 DIAGNOSIS — N2581 Secondary hyperparathyroidism of renal origin: Secondary | ICD-10-CM | POA: Diagnosis not present

## 2017-03-11 DIAGNOSIS — N186 End stage renal disease: Secondary | ICD-10-CM | POA: Diagnosis not present

## 2017-03-11 DIAGNOSIS — N2581 Secondary hyperparathyroidism of renal origin: Secondary | ICD-10-CM | POA: Diagnosis not present

## 2017-03-11 DIAGNOSIS — Z992 Dependence on renal dialysis: Secondary | ICD-10-CM | POA: Diagnosis not present

## 2017-03-11 DIAGNOSIS — E119 Type 2 diabetes mellitus without complications: Secondary | ICD-10-CM | POA: Diagnosis not present

## 2017-03-13 DIAGNOSIS — N2581 Secondary hyperparathyroidism of renal origin: Secondary | ICD-10-CM | POA: Diagnosis not present

## 2017-03-13 DIAGNOSIS — E119 Type 2 diabetes mellitus without complications: Secondary | ICD-10-CM | POA: Diagnosis not present

## 2017-03-13 DIAGNOSIS — N186 End stage renal disease: Secondary | ICD-10-CM | POA: Diagnosis not present

## 2017-03-13 DIAGNOSIS — Z992 Dependence on renal dialysis: Secondary | ICD-10-CM | POA: Diagnosis not present

## 2017-03-15 DIAGNOSIS — Z992 Dependence on renal dialysis: Secondary | ICD-10-CM | POA: Diagnosis not present

## 2017-03-15 DIAGNOSIS — N186 End stage renal disease: Secondary | ICD-10-CM | POA: Diagnosis not present

## 2017-03-15 DIAGNOSIS — E119 Type 2 diabetes mellitus without complications: Secondary | ICD-10-CM | POA: Diagnosis not present

## 2017-03-15 DIAGNOSIS — N2581 Secondary hyperparathyroidism of renal origin: Secondary | ICD-10-CM | POA: Diagnosis not present

## 2017-03-18 DIAGNOSIS — Z992 Dependence on renal dialysis: Secondary | ICD-10-CM | POA: Diagnosis not present

## 2017-03-18 DIAGNOSIS — N2581 Secondary hyperparathyroidism of renal origin: Secondary | ICD-10-CM | POA: Diagnosis not present

## 2017-03-18 DIAGNOSIS — N186 End stage renal disease: Secondary | ICD-10-CM | POA: Diagnosis not present

## 2017-03-18 DIAGNOSIS — E119 Type 2 diabetes mellitus without complications: Secondary | ICD-10-CM | POA: Diagnosis not present

## 2017-03-20 DIAGNOSIS — N2581 Secondary hyperparathyroidism of renal origin: Secondary | ICD-10-CM | POA: Diagnosis not present

## 2017-03-20 DIAGNOSIS — E119 Type 2 diabetes mellitus without complications: Secondary | ICD-10-CM | POA: Diagnosis not present

## 2017-03-20 DIAGNOSIS — Z992 Dependence on renal dialysis: Secondary | ICD-10-CM | POA: Diagnosis not present

## 2017-03-20 DIAGNOSIS — N186 End stage renal disease: Secondary | ICD-10-CM | POA: Diagnosis not present

## 2017-03-22 DIAGNOSIS — N186 End stage renal disease: Secondary | ICD-10-CM | POA: Diagnosis not present

## 2017-03-22 DIAGNOSIS — Z992 Dependence on renal dialysis: Secondary | ICD-10-CM | POA: Diagnosis not present

## 2017-03-22 DIAGNOSIS — N2581 Secondary hyperparathyroidism of renal origin: Secondary | ICD-10-CM | POA: Diagnosis not present

## 2017-03-22 DIAGNOSIS — E119 Type 2 diabetes mellitus without complications: Secondary | ICD-10-CM | POA: Diagnosis not present

## 2017-03-25 DIAGNOSIS — N186 End stage renal disease: Secondary | ICD-10-CM | POA: Diagnosis not present

## 2017-03-25 DIAGNOSIS — Z992 Dependence on renal dialysis: Secondary | ICD-10-CM | POA: Diagnosis not present

## 2017-03-25 DIAGNOSIS — E119 Type 2 diabetes mellitus without complications: Secondary | ICD-10-CM | POA: Diagnosis not present

## 2017-03-25 DIAGNOSIS — N2581 Secondary hyperparathyroidism of renal origin: Secondary | ICD-10-CM | POA: Diagnosis not present

## 2017-03-26 DIAGNOSIS — Z992 Dependence on renal dialysis: Secondary | ICD-10-CM | POA: Diagnosis not present

## 2017-03-26 DIAGNOSIS — T82858A Stenosis of vascular prosthetic devices, implants and grafts, initial encounter: Secondary | ICD-10-CM | POA: Diagnosis not present

## 2017-03-26 DIAGNOSIS — I871 Compression of vein: Secondary | ICD-10-CM | POA: Diagnosis not present

## 2017-03-26 DIAGNOSIS — N186 End stage renal disease: Secondary | ICD-10-CM | POA: Diagnosis not present

## 2017-03-27 DIAGNOSIS — E119 Type 2 diabetes mellitus without complications: Secondary | ICD-10-CM | POA: Diagnosis not present

## 2017-03-27 DIAGNOSIS — N186 End stage renal disease: Secondary | ICD-10-CM | POA: Diagnosis not present

## 2017-03-27 DIAGNOSIS — N2581 Secondary hyperparathyroidism of renal origin: Secondary | ICD-10-CM | POA: Diagnosis not present

## 2017-03-27 DIAGNOSIS — Z992 Dependence on renal dialysis: Secondary | ICD-10-CM | POA: Diagnosis not present

## 2017-03-28 ENCOUNTER — Other Ambulatory Visit: Payer: Self-pay | Admitting: Hematology and Oncology

## 2017-03-28 DIAGNOSIS — I129 Hypertensive chronic kidney disease with stage 1 through stage 4 chronic kidney disease, or unspecified chronic kidney disease: Secondary | ICD-10-CM | POA: Diagnosis not present

## 2017-03-28 DIAGNOSIS — Z992 Dependence on renal dialysis: Secondary | ICD-10-CM | POA: Diagnosis not present

## 2017-03-28 DIAGNOSIS — N186 End stage renal disease: Secondary | ICD-10-CM | POA: Diagnosis not present

## 2017-03-28 DIAGNOSIS — C8338 Diffuse large B-cell lymphoma, lymph nodes of multiple sites: Secondary | ICD-10-CM

## 2017-03-29 DIAGNOSIS — N2581 Secondary hyperparathyroidism of renal origin: Secondary | ICD-10-CM | POA: Diagnosis not present

## 2017-03-29 DIAGNOSIS — N186 End stage renal disease: Secondary | ICD-10-CM | POA: Diagnosis not present

## 2017-03-29 DIAGNOSIS — Z992 Dependence on renal dialysis: Secondary | ICD-10-CM | POA: Diagnosis not present

## 2017-03-29 DIAGNOSIS — E119 Type 2 diabetes mellitus without complications: Secondary | ICD-10-CM | POA: Diagnosis not present

## 2017-03-31 ENCOUNTER — Inpatient Hospital Stay: Payer: Medicare Other | Attending: Hematology and Oncology | Admitting: Hematology and Oncology

## 2017-03-31 ENCOUNTER — Telehealth: Payer: Self-pay | Admitting: Hematology and Oncology

## 2017-03-31 ENCOUNTER — Encounter: Payer: Self-pay | Admitting: Hematology and Oncology

## 2017-03-31 ENCOUNTER — Inpatient Hospital Stay: Payer: Medicare Other

## 2017-03-31 VITALS — BP 119/84 | HR 72 | Temp 97.8°F | Resp 18 | Ht 71.0 in | Wt 198.6 lb

## 2017-03-31 DIAGNOSIS — Z992 Dependence on renal dialysis: Secondary | ICD-10-CM | POA: Diagnosis not present

## 2017-03-31 DIAGNOSIS — C8338 Diffuse large B-cell lymphoma, lymph nodes of multiple sites: Secondary | ICD-10-CM

## 2017-03-31 DIAGNOSIS — D631 Anemia in chronic kidney disease: Secondary | ICD-10-CM | POA: Insufficient documentation

## 2017-03-31 DIAGNOSIS — N186 End stage renal disease: Secondary | ICD-10-CM | POA: Diagnosis not present

## 2017-03-31 DIAGNOSIS — L231 Allergic contact dermatitis due to adhesives: Secondary | ICD-10-CM

## 2017-03-31 DIAGNOSIS — R911 Solitary pulmonary nodule: Secondary | ICD-10-CM | POA: Insufficient documentation

## 2017-03-31 DIAGNOSIS — N281 Cyst of kidney, acquired: Secondary | ICD-10-CM | POA: Diagnosis not present

## 2017-03-31 LAB — CBC WITH DIFFERENTIAL/PLATELET
BASOS PCT: 1 %
Basophils Absolute: 0.1 10*3/uL (ref 0.0–0.1)
EOS ABS: 1.7 10*3/uL — AB (ref 0.0–0.5)
Eosinophils Relative: 23 %
HCT: 39.4 % (ref 38.4–49.9)
Hemoglobin: 12.9 g/dL — ABNORMAL LOW (ref 13.0–17.1)
Lymphocytes Relative: 28 %
Lymphs Abs: 2 10*3/uL (ref 0.9–3.3)
MCH: 33.9 pg — ABNORMAL HIGH (ref 27.2–33.4)
MCHC: 32.7 g/dL (ref 32.0–36.0)
MCV: 103.7 fL — ABNORMAL HIGH (ref 79.3–98.0)
MONO ABS: 0.5 10*3/uL (ref 0.1–0.9)
Monocytes Relative: 7 %
Neutro Abs: 3 10*3/uL (ref 1.5–6.5)
Neutrophils Relative %: 41 %
Platelets: 171 10*3/uL (ref 140–400)
RBC: 3.8 MIL/uL — ABNORMAL LOW (ref 4.20–5.82)
RDW: 14.1 % (ref 11.0–14.6)
WBC: 7.2 10*3/uL (ref 4.0–10.3)

## 2017-03-31 NOTE — Assessment & Plan Note (Signed)
The patient was referred to see dermatologist and was prescribed special ointment His rash is improving dramatically and eosinophil count is improved I would defer to his dermatologist for further management He still have persistent eosinophilia I recommend he continue over-the-counter allergy medicine

## 2017-03-31 NOTE — Assessment & Plan Note (Signed)
This is likely anemia of chronic disease. The patient denies recent history of bleeding such as epistaxis, hematuria or hematochezia. He is asymptomatic from the anemia. We will observe for now.  He does not require transfusion now.  I would defer ESA management to nephrologist.

## 2017-03-31 NOTE — Telephone Encounter (Signed)
Gave avs and calendar for march °

## 2017-03-31 NOTE — Assessment & Plan Note (Signed)
Clinically, he has no detectable signs of cancer recurrence on exam However, the patient is at high risk for relapse His last CT imaging showed lung nodule and abnormal kidney cysts I recommend repeat CT scan of the chest, abdomen and pelvis without contrast for further evaluation and he agreed to proceed

## 2017-03-31 NOTE — Progress Notes (Signed)
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 The patient denies recent history of bleeding such as epistaxis, hematuria or hematochezia. He is asymptomatic from the anemia. We will observe for now.  He does not require transfusion now.  I would defer ESA management to nephrologist.  Contact dermatitis, allergic The patient was referred to see dermatologist and was prescribed special ointment His rash is improving dramatically and eosinophil count is improved I would defer to his dermatologist for further management He still have persistent eosinophilia I recommend he continue over-the-counter allergy medicine   Orders Placed This Encounter  Procedures  . CT Chest Wo Contrast    Standing Status:   Future    Standing Expiration Date:   03/31/2018    Order Specific Question:   Preferred imaging location?    Answer:   [facility]    Order Specific Question:   Radiology Contrast Protocol - do NOT remove file path    Answer:   [email or web address]  . CT Abdomen Pelvis Wo Contrast    Standing Status:   Future    Standing Expiration Date:   03/31/2018    Order Specific Question:   Preferred imaging location?    Answer:   [facility]    Order Specific Question:   Radiology Contrast  Protocol - do NOT remove file path    Answer:   [email or web address]    INTERVAL HISTORY: Please see below for problem oriented charting. He returns for further follow-up He continues to have mild dermatitis at the site of his dialysis access He had poor peripheral venous access and would like to keep the port for future blood draws He denies recent cough, chest pain or shortness of breath He denies recent lymphadenopathy or infection. 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 ([place])   06/07/2015 Initial Diagnosis    Non-Hodgkin lymphoma of intra-abdominal lymph nodes ([place])      06/12/2015 Imaging    CT chest showed lymphadenopathy within the low neck and low chest, consistent with active lymphoma      06/14/2015 Procedure    He underwent CT-guided biopsy      06/14/2015 Pathology Results    Biopsy of the retroperitoneal mass confirmed diffuse large B-cell lymphoma      06/21/2015 Imaging    ECHO showed normal EF      06/21/2015 Procedure    He has port placement      06/23/2015 - 10/12/2015 Chemotherapy    He received mini-RCHOP chemo x 6 cycles      08/25/2015 PET scan    Significant interval decrease in size of the bulky retroperitoneal lymphadenopathy suggesting an excellent response to therapy. Residual matted soft tissue density in the retroperitoneum with SUV max of approximately 3.3  11/27/2015 PET scan    Continued improved appearance of the treated lymphoma. Minimal residual matted soft tissue density but no discrete measurable disease and no hypermetabolism to suggest metabolically active tumor.      05/24/2016 Imaging    Unchanged retroperitoneal soft tissue most compatible with history of treated lymphoma. No new or enlarging adenopathy in the chest, abdomen or pelvis. Polycystic renal disease. Bilateral fat containing inguinal hernias.  Aortic atherosclerosis.      01/15/2017 Imaging    1. Stable findings of treated tumor in the retroperitoneum and right pelvis. No evidence of recurrent lymphoma in the abdomen or pelvis. 2. New 2.1 cm nodular opacity in the dependent right lower lobe, nonspecific, more likely inflammatory. No thoracic adenopathy. Recommend attention on follow-up chest CT in 3 months. 3. Polycystic kidneys. Increased thin mural calcification associated with the dominant exophytic 6.2 cm medial lower left renal cyst, which is stable in size and technically indeterminate for neoplasm. Presumably, an IV contrast-enhanced study is precluded by poor renal function. Given the size stability, continued surveillance at CT abdomen is a reasonable option. 4. Chronic findings include: Aortic Atherosclerosis (ICD10-I70.0). Small hiatal hernia. Mild terminal ileum and large bowel diverticulosis. Mild prostatomegaly.       REVIEW OF SYSTEMS:   Constitutional: Denies fevers, chills or abnormal weight loss Eyes: Denies blurriness of vision Ears, nose, mouth, throat, and face: Denies mucositis or sore throat Respiratory: Denies cough, dyspnea or wheezes Cardiovascular: Denies palpitation, chest discomfort or lower extremity swelling Gastrointestinal:  Denies nausea, heartburn or change in bowel habits Lymphatics: Denies new lymphadenopathy or easy bruising Neurological:Denies numbness, tingling or new weaknesses Behavioral/Psych: Mood is stable, no new changes  All other systems were reviewed with the patient and are negative.  I have reviewed the past medical history, past surgical history, social history and family history with the patient and they are unchanged from previous note.  ALLERGIES:  is allergic to tape.  MEDICATIONS:  Current Outpatient Medications  Medication Sig Dispense Refill  . aspirin 325 MG tablet Take 1 tablet (325 mg total) by mouth daily. 30 tablet 0  . atorvastatin (LIPITOR) 10 MG tablet Take  10 mg by mouth daily.  0  . calcium acetate (PHOSLO) 667 MG capsule Take 1 capsule by mouth 2 (two) times daily.   11  . clobetasol ointment (TEMOVATE) 5.46 % Apply 1 application topically 2 (two) times daily.    . cloNIDine (CATAPRES) 0.2 MG tablet Take 0.4 mg by mouth 2 (two) times daily.     Marland Kitchen docusate sodium (COLACE) 100 MG capsule Take 1 capsule (100 mg total) by mouth every 12 (twelve) hours. (Patient taking differently: Take 100 mg by mouth every 12 (twelve) hours. ) 60 capsule 0  . hydrOXYzine (ATARAX/VISTARIL) 10 MG tablet Take 10 mg by mouth daily.    . isosorbide-hydrALAZINE (BIDIL) 20-37.5 MG tablet Take 0.5 tablets by mouth 2 (two) times daily.    Marland Kitchen levETIRAcetam (KEPPRA) 500 MG tablet Take 1 tablet (500 mg total) by mouth 2 (two) times daily. 60 tablet 6  . levothyroxine (SYNTHROID, LEVOTHROID) 25 MCG tablet Take 25 mcg by mouth daily.   0  . lidocaine-prilocaine (EMLA) cream Apply to affected area once 30 g 3  . metoprolol (LOPRESSOR) 100 MG tablet Take 150 mg by mouth 2 (two) times daily.     . multivitamin (RENA-VIT) TABS tablet Take 1 tablet by mouth at bedtime. 90 tablet 0   No current facility-administered medications for this visit.  PHYSICAL EXAMINATION: ECOG PERFORMANCE STATUS: 1 - Symptomatic but completely ambulatory  Vitals:   03/31/17 1023  BP: 119/84  Pulse: 72  Resp: 18  Temp: 97.8 F (36.6 C)  SpO2: 99%   Filed Weights   03/31/17 1023  Weight: 198 lb 9.6 oz (90.1 kg)    GENERAL:alert, no distress and comfortable SKIN: Previous site of dermatitis is stable EYES: normal, Conjunctiva are pink and non-injected, sclera clear OROPHARYNX:no exudate, no erythema and lips, buccal mucosa, and tongue normal  NECK: supple, thyroid normal size, non-tender, without nodularity LYMPH:  no palpable lymphadenopathy in the cervical, axillary or inguinal LUNGS: clear to auscultation and percussion with normal breathing effort HEART: regular rate & rhythm and no  murmurs and no lower extremity edema ABDOMEN:abdomen soft, non-tender and normal bowel sounds Musculoskeletal:no cyanosis of digits and no clubbing  NEURO: alert & oriented x 3 with fluent speech, with chronic right-sided weakness from stroke  LABORATORY DATA:  I have reviewed the data as listed    Component Value Date/Time   NA 138 07/08/2016 1052   K 4.3 07/08/2016 1052   CL 95 (L) 06/21/2015 1345   CO2 29 07/08/2016 1052   GLUCOSE 87 07/08/2016 1052   BUN 42.7 (H) 07/08/2016 1052   CREATININE 8.7 (HH) 07/08/2016 1052   CALCIUM 9.6 07/08/2016 1052   PROT 7.4 12/30/2016 1005   PROT 6.7 07/08/2016 1052   ALBUMIN 4.4 12/30/2016 1005   ALBUMIN 3.1 (L) 07/08/2016 1052   AST 24 12/30/2016 1005   AST 15 07/08/2016 1052   ALT 22 12/30/2016 1005   ALT 14 07/08/2016 1052   ALKPHOS 72 12/30/2016 1005   ALKPHOS 62 07/08/2016 1052   BILITOT 0.3 12/30/2016 1005   BILITOT 0.41 07/08/2016 1052   GFRNONAA 8 (L) 06/21/2015 1345   GFRAA 9 (L) 06/21/2015 1345    No results found for: SPEP, UPEP  Lab Results  Component Value Date   WBC 7.2 03/31/2017   NEUTROABS 3.0 03/31/2017   HGB 12.9 (L) 03/31/2017   HCT 39.4 03/31/2017   MCV 103.7 (H) 03/31/2017   PLT 171 03/31/2017      Chemistry      Component Value Date/Time   NA 138 07/08/2016 1052   K 4.3 07/08/2016 1052   CL 95 (L) 06/21/2015 1345   CO2 29 07/08/2016 1052   BUN 42.7 (H) 07/08/2016 1052   CREATININE 8.7 (HH) 07/08/2016 1052      Component Value Date/Time   CALCIUM 9.6 07/08/2016 1052   ALKPHOS 72 12/30/2016 1005   ALKPHOS 62 07/08/2016 1052   AST 24 12/30/2016 1005   AST 15 07/08/2016 1052   ALT 22 12/30/2016 1005   ALT 14 07/08/2016 1052   BILITOT 0.3 12/30/2016 1005   BILITOT 0.41 07/08/2016 1052      All questions were answered. The patient knows to call the clinic with any problems, questions or concerns. No barriers to learning was detected.  I spent 15 minutes counseling the patient face to face. The  total time spent in the appointment was 20 minutes and more than 50% was on counseling and review of test results  Heath Lark, MD 03/31/2017 11:08 AM

## 2017-04-01 DIAGNOSIS — N186 End stage renal disease: Secondary | ICD-10-CM | POA: Diagnosis not present

## 2017-04-01 DIAGNOSIS — N2581 Secondary hyperparathyroidism of renal origin: Secondary | ICD-10-CM | POA: Diagnosis not present

## 2017-04-01 DIAGNOSIS — Z992 Dependence on renal dialysis: Secondary | ICD-10-CM | POA: Diagnosis not present

## 2017-04-01 DIAGNOSIS — E119 Type 2 diabetes mellitus without complications: Secondary | ICD-10-CM | POA: Diagnosis not present

## 2017-04-03 DIAGNOSIS — N186 End stage renal disease: Secondary | ICD-10-CM | POA: Diagnosis not present

## 2017-04-03 DIAGNOSIS — E119 Type 2 diabetes mellitus without complications: Secondary | ICD-10-CM | POA: Diagnosis not present

## 2017-04-03 DIAGNOSIS — Z992 Dependence on renal dialysis: Secondary | ICD-10-CM | POA: Diagnosis not present

## 2017-04-03 DIAGNOSIS — N2581 Secondary hyperparathyroidism of renal origin: Secondary | ICD-10-CM | POA: Diagnosis not present

## 2017-04-05 DIAGNOSIS — E119 Type 2 diabetes mellitus without complications: Secondary | ICD-10-CM | POA: Diagnosis not present

## 2017-04-05 DIAGNOSIS — N2581 Secondary hyperparathyroidism of renal origin: Secondary | ICD-10-CM | POA: Diagnosis not present

## 2017-04-05 DIAGNOSIS — N186 End stage renal disease: Secondary | ICD-10-CM | POA: Diagnosis not present

## 2017-04-05 DIAGNOSIS — Z992 Dependence on renal dialysis: Secondary | ICD-10-CM | POA: Diagnosis not present

## 2017-04-08 DIAGNOSIS — Z992 Dependence on renal dialysis: Secondary | ICD-10-CM | POA: Diagnosis not present

## 2017-04-08 DIAGNOSIS — N2581 Secondary hyperparathyroidism of renal origin: Secondary | ICD-10-CM | POA: Diagnosis not present

## 2017-04-08 DIAGNOSIS — E119 Type 2 diabetes mellitus without complications: Secondary | ICD-10-CM | POA: Diagnosis not present

## 2017-04-08 DIAGNOSIS — N186 End stage renal disease: Secondary | ICD-10-CM | POA: Diagnosis not present

## 2017-04-09 DIAGNOSIS — I120 Hypertensive chronic kidney disease with stage 5 chronic kidney disease or end stage renal disease: Secondary | ICD-10-CM | POA: Diagnosis not present

## 2017-04-09 DIAGNOSIS — R7309 Other abnormal glucose: Secondary | ICD-10-CM | POA: Diagnosis not present

## 2017-04-09 DIAGNOSIS — N186 End stage renal disease: Secondary | ICD-10-CM | POA: Diagnosis not present

## 2017-04-09 DIAGNOSIS — E782 Mixed hyperlipidemia: Secondary | ICD-10-CM | POA: Diagnosis not present

## 2017-04-09 DIAGNOSIS — Z992 Dependence on renal dialysis: Secondary | ICD-10-CM | POA: Diagnosis not present

## 2017-04-09 DIAGNOSIS — E039 Hypothyroidism, unspecified: Secondary | ICD-10-CM | POA: Diagnosis not present

## 2017-04-09 DIAGNOSIS — N185 Chronic kidney disease, stage 5: Secondary | ICD-10-CM | POA: Diagnosis not present

## 2017-04-10 DIAGNOSIS — N186 End stage renal disease: Secondary | ICD-10-CM | POA: Diagnosis not present

## 2017-04-10 DIAGNOSIS — N2581 Secondary hyperparathyroidism of renal origin: Secondary | ICD-10-CM | POA: Diagnosis not present

## 2017-04-10 DIAGNOSIS — Z992 Dependence on renal dialysis: Secondary | ICD-10-CM | POA: Diagnosis not present

## 2017-04-10 DIAGNOSIS — E119 Type 2 diabetes mellitus without complications: Secondary | ICD-10-CM | POA: Diagnosis not present

## 2017-04-12 DIAGNOSIS — Z992 Dependence on renal dialysis: Secondary | ICD-10-CM | POA: Diagnosis not present

## 2017-04-12 DIAGNOSIS — E119 Type 2 diabetes mellitus without complications: Secondary | ICD-10-CM | POA: Diagnosis not present

## 2017-04-12 DIAGNOSIS — N186 End stage renal disease: Secondary | ICD-10-CM | POA: Diagnosis not present

## 2017-04-12 DIAGNOSIS — N2581 Secondary hyperparathyroidism of renal origin: Secondary | ICD-10-CM | POA: Diagnosis not present

## 2017-04-15 DIAGNOSIS — E119 Type 2 diabetes mellitus without complications: Secondary | ICD-10-CM | POA: Diagnosis not present

## 2017-04-15 DIAGNOSIS — N186 End stage renal disease: Secondary | ICD-10-CM | POA: Diagnosis not present

## 2017-04-15 DIAGNOSIS — N2581 Secondary hyperparathyroidism of renal origin: Secondary | ICD-10-CM | POA: Diagnosis not present

## 2017-04-15 DIAGNOSIS — Z992 Dependence on renal dialysis: Secondary | ICD-10-CM | POA: Diagnosis not present

## 2017-04-16 ENCOUNTER — Ambulatory Visit (HOSPITAL_COMMUNITY)
Admission: RE | Admit: 2017-04-16 | Discharge: 2017-04-16 | Disposition: A | Discharge status: Home / Self Care | Payer: Medicare Other | Source: Ambulatory Visit | Attending: Hematology and Oncology | Admitting: Hematology and Oncology

## 2017-04-16 DIAGNOSIS — C8330 Diffuse large B-cell lymphoma, unspecified site: Secondary | ICD-10-CM | POA: Diagnosis not present

## 2017-04-16 DIAGNOSIS — R911 Solitary pulmonary nodule: Secondary | ICD-10-CM

## 2017-04-16 DIAGNOSIS — C8338 Diffuse large B-cell lymphoma, lymph nodes of multiple sites: Secondary | ICD-10-CM | POA: Insufficient documentation

## 2017-04-16 DIAGNOSIS — J984 Other disorders of lung: Secondary | ICD-10-CM | POA: Insufficient documentation

## 2017-04-16 DIAGNOSIS — Q613 Polycystic kidney, unspecified: Secondary | ICD-10-CM | POA: Diagnosis not present

## 2017-04-17 DIAGNOSIS — Z992 Dependence on renal dialysis: Secondary | ICD-10-CM | POA: Diagnosis not present

## 2017-04-17 DIAGNOSIS — N2581 Secondary hyperparathyroidism of renal origin: Secondary | ICD-10-CM | POA: Diagnosis not present

## 2017-04-17 DIAGNOSIS — E119 Type 2 diabetes mellitus without complications: Secondary | ICD-10-CM | POA: Diagnosis not present

## 2017-04-17 DIAGNOSIS — N186 End stage renal disease: Secondary | ICD-10-CM | POA: Diagnosis not present

## 2017-04-19 DIAGNOSIS — E119 Type 2 diabetes mellitus without complications: Secondary | ICD-10-CM | POA: Diagnosis not present

## 2017-04-19 DIAGNOSIS — N2581 Secondary hyperparathyroidism of renal origin: Secondary | ICD-10-CM | POA: Diagnosis not present

## 2017-04-19 DIAGNOSIS — N186 End stage renal disease: Secondary | ICD-10-CM | POA: Diagnosis not present

## 2017-04-19 DIAGNOSIS — Z992 Dependence on renal dialysis: Secondary | ICD-10-CM | POA: Diagnosis not present

## 2017-04-21 ENCOUNTER — Inpatient Hospital Stay (HOSPITAL_BASED_OUTPATIENT_CLINIC_OR_DEPARTMENT_OTHER): Payer: Medicare Other | Admitting: Hematology and Oncology

## 2017-04-21 ENCOUNTER — Telehealth: Payer: Self-pay | Admitting: Hematology and Oncology

## 2017-04-21 VITALS — BP 124/88 | HR 65 | Temp 97.5°F | Resp 18 | Ht 71.0 in | Wt 200.9 lb

## 2017-04-21 DIAGNOSIS — D631 Anemia in chronic kidney disease: Secondary | ICD-10-CM | POA: Diagnosis not present

## 2017-04-21 DIAGNOSIS — N186 End stage renal disease: Secondary | ICD-10-CM | POA: Diagnosis not present

## 2017-04-21 DIAGNOSIS — C8338 Diffuse large B-cell lymphoma, lymph nodes of multiple sites: Secondary | ICD-10-CM

## 2017-04-21 DIAGNOSIS — R911 Solitary pulmonary nodule: Secondary | ICD-10-CM

## 2017-04-21 DIAGNOSIS — Z992 Dependence on renal dialysis: Secondary | ICD-10-CM | POA: Diagnosis not present

## 2017-04-21 NOTE — Telephone Encounter (Signed)
Gave avs and calendar ° °

## 2017-04-22 ENCOUNTER — Encounter: Payer: Self-pay | Admitting: Hematology and Oncology

## 2017-04-22 DIAGNOSIS — Z992 Dependence on renal dialysis: Secondary | ICD-10-CM | POA: Diagnosis not present

## 2017-04-22 DIAGNOSIS — E119 Type 2 diabetes mellitus without complications: Secondary | ICD-10-CM | POA: Diagnosis not present

## 2017-04-22 DIAGNOSIS — N186 End stage renal disease: Secondary | ICD-10-CM | POA: Diagnosis not present

## 2017-04-22 DIAGNOSIS — N2581 Secondary hyperparathyroidism of renal origin: Secondary | ICD-10-CM | POA: Diagnosis not present

## 2017-04-22 NOTE — Progress Notes (Signed)
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  He agreed with the plan of care  Nodule of lower lobe of right lung I have reviewed the imaging study with him and his wife The nodular appearance of the right lung is consistent with a cyst It is benign He does not need chronic imaging studies to follow-up on this   Orders Placed This Encounter  Procedures  . IR Removal Tun Access W/ Port W/O FL    Standing Status:   Future    Standing Expiration Date:   06/22/2018    Order Specific Question:   Reason for exam:    Answer:   no need port    Order Specific Question:   Preferred Imaging Location?    Answer:   [facility]    INTERVAL HISTORY: Please see below for problem oriented charting. 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 ([place])   06/07/2015 Initial Diagnosis    Non-Hodgkin lymphoma of intra-abdominal lymph nodes ([place])      06/12/2015 Imaging    CT chest showed lymphadenopathy within the low neck and low chest, consistent with active lymphoma       06/14/2015 Procedure    He underwent CT-guided biopsy      06/14/2015 Pathology Results    Biopsy of the retroperitoneal mass confirmed diffuse large B-cell lymphoma      06/21/2015 Imaging    ECHO showed normal EF      06/21/2015 Procedure    He has port placement      06/23/2015 - 10/12/2015 Chemotherapy    He received mini-RCHOP chemo x 6 cycles      08/25/2015 PET scan    Significant interval decrease in size of the bulky retroperitoneal lymphadenopathy suggesting an excellent response to therapy. Residual matted soft tissue density in the retroperitoneum with SUV max of approximately 3.3      11/27/2015 PET scan    Continued improved appearance of the treated lymphoma. Minimal residual matted soft tissue density but no discrete measurable disease and no hypermetabolism to suggest metabolically active tumor.      05/24/2016 Imaging    Unchanged retroperitoneal soft tissue most compatible with history of treated lymphoma. No new or enlarging adenopathy in the chest, abdomen or pelvis. Polycystic renal disease. Bilateral fat containing inguinal hernias. Aortic atherosclerosis.      01/15/2017 Imaging    1. Stable findings of treated tumor in the retroperitoneum and right pelvis. No evidence of recurrent lymphoma in the abdomen or pelvis. 2. New 2.1 cm nodular opacity in the dependent right lower  lobe, nonspecific, more likely inflammatory. No thoracic adenopathy. Recommend attention on follow-up chest CT in 3 months. 3. Polycystic kidneys. Increased thin mural calcification associated with the dominant exophytic 6.2 cm medial lower left renal cyst, which is stable in size and technically indeterminate for neoplasm. Presumably, an IV contrast-enhanced study is precluded by poor renal function. Given the size stability, continued surveillance at CT abdomen is a reasonable option. 4. Chronic findings include: Aortic Atherosclerosis (ICD10-I70.0). Small hiatal hernia. Mild terminal  ileum and large bowel diverticulosis. Mild prostatomegaly.      04/17/2017 Imaging    CT chest 1. Stable pleural or subpleural cyst in the right lower lobe. No worrisome pulmonary lesions or acute pulmonary findings. 2. No mediastinal or hilar mass or adenopathy. No supraclavicular or axillary adenopathy. CT abdomen 3. No abdominal/pelvic/inguinal lymphadenopathy. 4. Polycystic kidney disease. 5. No acute abdominal/pelvic findings.       REVIEW OF SYSTEMS:   Constitutional: Denies fevers, chills or abnormal weight loss Eyes: Denies blurriness of vision Ears, nose, mouth, throat, and face: Denies mucositis or sore throat Respiratory: Denies cough, dyspnea or wheezes Cardiovascular: Denies palpitation, chest discomfort or lower extremity swelling Gastrointestinal:  Denies nausea, heartburn or change in bowel habits Skin: Denies abnormal skin rashes Lymphatics: Denies new lymphadenopathy or easy bruising Neurological:Denies numbness, tingling or new weaknesses Behavioral/Psych: Mood is stable, no new changes  All other systems were reviewed with the patient and are negative.  I have reviewed the past medical history, past surgical history, social history and family history with the patient and they are unchanged from previous note.  ALLERGIES:  is allergic to tape.  MEDICATIONS:  Current Outpatient Medications  Medication Sig Dispense Refill  . aspirin 325 MG tablet Take 1 tablet (325 mg total) by mouth daily. 30 tablet 0  . atorvastatin (LIPITOR) 10 MG tablet Take 10 mg by mouth daily.  0  . calcium acetate (PHOSLO) 667 MG capsule Take 1 capsule by mouth 2 (two) times daily.   11  . clobetasol ointment (TEMOVATE) 0.16 % Apply 1 application topically 2 (two) times daily.    . cloNIDine (CATAPRES) 0.2 MG tablet Take 0.4 mg by mouth 2 (two) times daily.     Marland Kitchen docusate sodium (COLACE) 100 MG capsule Take 1 capsule (100 mg total) by mouth every 12 (twelve) hours. (Patient taking  differently: Take 100 mg by mouth every 12 (twelve) hours. ) 60 capsule 0  . hydrOXYzine (ATARAX/VISTARIL) 10 MG tablet Take 10 mg by mouth daily.    . isosorbide-hydrALAZINE (BIDIL) 20-37.5 MG tablet Take 0.5 tablets by mouth 2 (two) times daily.    Marland Kitchen levETIRAcetam (KEPPRA) 500 MG tablet Take 1 tablet (500 mg total) by mouth 2 (two) times daily. 60 tablet 6  . levothyroxine (SYNTHROID, LEVOTHROID) 25 MCG tablet Take 25 mcg by mouth daily.   0  . lidocaine-prilocaine (EMLA) cream Apply to affected area once 30 g 3  . metoprolol (LOPRESSOR) 100 MG tablet Take 150 mg by mouth 2 (two) times daily.     . multivitamin (RENA-VIT) TABS tablet Take 1 tablet by mouth at bedtime. 90 tablet 0   No current facility-administered medications for this visit.     PHYSICAL EXAMINATION: ECOG PERFORMANCE STATUS: 0 - Asymptomatic  Vitals:   04/21/17 1348  BP: 124/88  Pulse: 65  Resp: 18  Temp: (!) 97.5 F (36.4 C)  SpO2: 99%   Filed Weights   04/21/17 1348  Weight: 200 lb 14.4 oz (91.1 kg)  GENERAL:alert, no distress and comfortable  LABORATORY DATA:  I have reviewed the data as listed    Component Value Date/Time   NA 138 07/08/2016 1052   K 4.3 07/08/2016 1052   CL 95 (L) 06/21/2015 1345   CO2 29 07/08/2016 1052   GLUCOSE 87 07/08/2016 1052   BUN 42.7 (H) 07/08/2016 1052   CREATININE 8.7 (HH) 07/08/2016 1052   CALCIUM 9.6 07/08/2016 1052   PROT 7.4 12/30/2016 1005   PROT 6.7 07/08/2016 1052   ALBUMIN 4.4 12/30/2016 1005   ALBUMIN 3.1 (L) 07/08/2016 1052   AST 24 12/30/2016 1005   AST 15 07/08/2016 1052   ALT 22 12/30/2016 1005   ALT 14 07/08/2016 1052   ALKPHOS 72 12/30/2016 1005   ALKPHOS 62 07/08/2016 1052   BILITOT 0.3 12/30/2016 1005   BILITOT 0.41 07/08/2016 1052   GFRNONAA 8 (L) 06/21/2015 1345   GFRAA 9 (L) 06/21/2015 1345    No results found for: SPEP, UPEP  Lab Results  Component Value Date   WBC 7.2 03/31/2017   NEUTROABS 3.0 03/31/2017   HGB 12.9 (L)  03/31/2017   HCT 39.4 03/31/2017   MCV 103.7 (H) 03/31/2017   PLT 171 03/31/2017      Chemistry      Component Value Date/Time   NA 138 07/08/2016 1052   K 4.3 07/08/2016 1052   CL 95 (L) 06/21/2015 1345   CO2 29 07/08/2016 1052   BUN 42.7 (H) 07/08/2016 1052   CREATININE 8.7 (HH) 07/08/2016 1052      Component Value Date/Time   CALCIUM 9.6 07/08/2016 1052   ALKPHOS 72 12/30/2016 1005   ALKPHOS 62 07/08/2016 1052   AST 24 12/30/2016 1005   AST 15 07/08/2016 1052   ALT 22 12/30/2016 1005   ALT 14 07/08/2016 1052   BILITOT 0.3 12/30/2016 1005   BILITOT 0.41 07/08/2016 1052       RADIOGRAPHIC STUDIES: I have reviewed multiple imaging study with the patient and his wife in great detail I have personally reviewed the radiological images as listed and agreed with the findings in the report. Ct Abdomen Pelvis Wo Contrast  Result Date: 04/17/2017 CLINICAL DATA:  Diffuse large B-cell lymphoma restaging. EXAM: CT CHEST, ABDOMEN AND PELVIS WITHOUT CONTRAST TECHNIQUE: Multidetector CT imaging of the chest, abdomen and pelvis was performed following the standard protocol without IV contrast. COMPARISON:  CT scan 01/15/2017 FINDINGS: CT CHEST FINDINGS Cardiovascular: The heart is normal in size and stable. Stable tortuosity and calcification of the thoracic aorta. Scattered coronary artery calcifications are stable. No pericardial effusion. Mediastinum/Nodes: No mediastinal or hilar mass or lymphadenopathy. The esophagus is grossly normal. There is a small hiatal hernia. Lungs/Pleura: Stable small pleural cyst on the right side. Streaky basilar scarring changes on the right but no worrisome pulmonary lesions or pulmonary nodules. Musculoskeletal: No chest wall mass, supraclavicular or axillary lymphadenopathy. The thyroid gland appears normal. The bony structures are unremarkable. CT ABDOMEN PELVIS FINDINGS Hepatobiliary: No focal hepatic lesions or intrahepatic biliary dilatation. The  gallbladder is normal. No common bile duct dilatation. Pancreas: No mass, inflammation or ductal dilatation. Spleen: Normal size.  No focal lesions. Adrenals/Urinary Tract: The adrenal glands are normal and stable. Bilateral polycystic kidney disease. Some of the cysts have rim calcification but no worrisome findings without contrast. Stomach/Bowel: The stomach, duodenum, small bowel and colon are unremarkable. The terminal ileum and appendix are normal. Scattered colonic diverticulosis without findings for acute diverticulitis. Vascular/Lymphatic: Stable atherosclerotic calcifications but no aneurysm.  Mild interstitial changes surrounding aorta likely due to previously treated lymphoma. No recurrent lymphadenopathy is demonstrated. No pelvic adenopathy or inguinal adenopathy. Reproductive: The prostate gland and seminal vesicles are unremarkable. Other: No free pelvic fluid collections.  No abdominal wall hernia. Musculoskeletal: No significant bony findings. IMPRESSION: 1. Stable pleural or subpleural cyst in the right lower lobe. No worrisome pulmonary lesions or acute pulmonary findings. 2. No mediastinal or hilar mass or adenopathy. No supraclavicular or axillary adenopathy. 3. No abdominal/pelvic/inguinal lymphadenopathy. 4. Polycystic kidney disease. 5. No acute abdominal/pelvic findings. Electronically Signed   By: Marijo Sanes M.D.   On: 04/17/2017 09:30   Ct Chest Wo Contrast  Result Date: 04/17/2017 CLINICAL DATA:  Diffuse large B-cell lymphoma restaging. EXAM: CT CHEST, ABDOMEN AND PELVIS WITHOUT CONTRAST TECHNIQUE: Multidetector CT imaging of the chest, abdomen and pelvis was performed following the standard protocol without IV contrast. COMPARISON:  CT scan 01/15/2017 FINDINGS: CT CHEST FINDINGS Cardiovascular: The heart is normal in size and stable. Stable tortuosity and calcification of the thoracic aorta. Scattered coronary artery calcifications are stable. No pericardial effusion.  Mediastinum/Nodes: No mediastinal or hilar mass or lymphadenopathy. The esophagus is grossly normal. There is a small hiatal hernia. Lungs/Pleura: Stable small pleural cyst on the right side. Streaky basilar scarring changes on the right but no worrisome pulmonary lesions or pulmonary nodules. Musculoskeletal: No chest wall mass, supraclavicular or axillary lymphadenopathy. The thyroid gland appears normal. The bony structures are unremarkable. CT ABDOMEN PELVIS FINDINGS Hepatobiliary: No focal hepatic lesions or intrahepatic biliary dilatation. The gallbladder is normal. No common bile duct dilatation. Pancreas: No mass, inflammation or ductal dilatation. Spleen: Normal size.  No focal lesions. Adrenals/Urinary Tract: The adrenal glands are normal and stable. Bilateral polycystic kidney disease. Some of the cysts have rim calcification but no worrisome findings without contrast. Stomach/Bowel: The stomach, duodenum, small bowel and colon are unremarkable. The terminal ileum and appendix are normal. Scattered colonic diverticulosis without findings for acute diverticulitis. Vascular/Lymphatic: Stable atherosclerotic calcifications but no aneurysm. Mild interstitial changes surrounding aorta likely due to previously treated lymphoma. No recurrent lymphadenopathy is demonstrated. No pelvic adenopathy or inguinal adenopathy. Reproductive: The prostate gland and seminal vesicles are unremarkable. Other: No free pelvic fluid collections.  No abdominal wall hernia. Musculoskeletal: No significant bony findings. IMPRESSION: 1. Stable pleural or subpleural cyst in the right lower lobe. No worrisome pulmonary lesions or acute pulmonary findings. 2. No mediastinal or hilar mass or adenopathy. No supraclavicular or axillary adenopathy. 3. No abdominal/pelvic/inguinal lymphadenopathy. 4. Polycystic kidney disease. 5. No acute abdominal/pelvic findings. Electronically Signed   By: Marijo Sanes M.D.   On: 04/17/2017 09:30     All questions were answered. The patient knows to call the clinic with any problems, questions or concerns. No barriers to learning was detected.  I spent 10 minutes counseling the patient face to face. The total time spent in the appointment was 15 minutes and more than 50% was on counseling and review of test results  Heath Lark, MD 04/22/2017 8:17 AM

## 2017-04-22 NOTE — Assessment & Plan Note (Signed)
I have reviewed the imaging study with him and his wife The nodular appearance of the right lung is consistent with a cyst It is benign He does not need chronic imaging studies to follow-up on this

## 2017-04-22 NOTE — Assessment & Plan Note (Signed)
I have reviewed imaging study and the current guidelines with the patient and his wife He is almost a year and a half away from last dose of chemotherapy CT scan showed no evidence of cancer recurrence I recommend port removal I plan to see him back in 6 months with history and physical examination Due to poor venous access, I recommend his nephrologist to fax me copies of his blood work periodically for monitoring instead of getting his blood drawn here at the cancer center.  He agreed with the plan of care

## 2017-04-24 DIAGNOSIS — N186 End stage renal disease: Secondary | ICD-10-CM | POA: Diagnosis not present

## 2017-04-24 DIAGNOSIS — N2581 Secondary hyperparathyroidism of renal origin: Secondary | ICD-10-CM | POA: Diagnosis not present

## 2017-04-24 DIAGNOSIS — E119 Type 2 diabetes mellitus without complications: Secondary | ICD-10-CM | POA: Diagnosis not present

## 2017-04-24 DIAGNOSIS — Z992 Dependence on renal dialysis: Secondary | ICD-10-CM | POA: Diagnosis not present

## 2017-04-26 DIAGNOSIS — N2581 Secondary hyperparathyroidism of renal origin: Secondary | ICD-10-CM | POA: Diagnosis not present

## 2017-04-26 DIAGNOSIS — N186 End stage renal disease: Secondary | ICD-10-CM | POA: Diagnosis not present

## 2017-04-26 DIAGNOSIS — Z992 Dependence on renal dialysis: Secondary | ICD-10-CM | POA: Diagnosis not present

## 2017-04-26 DIAGNOSIS — E119 Type 2 diabetes mellitus without complications: Secondary | ICD-10-CM | POA: Diagnosis not present

## 2017-04-28 DIAGNOSIS — N186 End stage renal disease: Secondary | ICD-10-CM | POA: Diagnosis not present

## 2017-04-28 DIAGNOSIS — Z992 Dependence on renal dialysis: Secondary | ICD-10-CM | POA: Diagnosis not present

## 2017-04-28 DIAGNOSIS — I129 Hypertensive chronic kidney disease with stage 1 through stage 4 chronic kidney disease, or unspecified chronic kidney disease: Secondary | ICD-10-CM | POA: Diagnosis not present

## 2017-04-29 DIAGNOSIS — N186 End stage renal disease: Secondary | ICD-10-CM | POA: Diagnosis not present

## 2017-04-29 DIAGNOSIS — E119 Type 2 diabetes mellitus without complications: Secondary | ICD-10-CM | POA: Diagnosis not present

## 2017-04-29 DIAGNOSIS — N2581 Secondary hyperparathyroidism of renal origin: Secondary | ICD-10-CM | POA: Diagnosis not present

## 2017-04-29 DIAGNOSIS — Z992 Dependence on renal dialysis: Secondary | ICD-10-CM | POA: Diagnosis not present

## 2017-04-30 DIAGNOSIS — E1351 Other specified diabetes mellitus with diabetic peripheral angiopathy without gangrene: Secondary | ICD-10-CM | POA: Diagnosis not present

## 2017-04-30 DIAGNOSIS — L84 Corns and callosities: Secondary | ICD-10-CM | POA: Diagnosis not present

## 2017-04-30 DIAGNOSIS — L602 Onychogryphosis: Secondary | ICD-10-CM | POA: Diagnosis not present

## 2017-05-01 DIAGNOSIS — Z992 Dependence on renal dialysis: Secondary | ICD-10-CM | POA: Diagnosis not present

## 2017-05-01 DIAGNOSIS — E119 Type 2 diabetes mellitus without complications: Secondary | ICD-10-CM | POA: Diagnosis not present

## 2017-05-01 DIAGNOSIS — N2581 Secondary hyperparathyroidism of renal origin: Secondary | ICD-10-CM | POA: Diagnosis not present

## 2017-05-01 DIAGNOSIS — N186 End stage renal disease: Secondary | ICD-10-CM | POA: Diagnosis not present

## 2017-05-03 DIAGNOSIS — N186 End stage renal disease: Secondary | ICD-10-CM | POA: Diagnosis not present

## 2017-05-03 DIAGNOSIS — E119 Type 2 diabetes mellitus without complications: Secondary | ICD-10-CM | POA: Diagnosis not present

## 2017-05-03 DIAGNOSIS — N2581 Secondary hyperparathyroidism of renal origin: Secondary | ICD-10-CM | POA: Diagnosis not present

## 2017-05-03 DIAGNOSIS — Z992 Dependence on renal dialysis: Secondary | ICD-10-CM | POA: Diagnosis not present

## 2017-05-06 DIAGNOSIS — Z992 Dependence on renal dialysis: Secondary | ICD-10-CM | POA: Diagnosis not present

## 2017-05-06 DIAGNOSIS — E119 Type 2 diabetes mellitus without complications: Secondary | ICD-10-CM | POA: Diagnosis not present

## 2017-05-06 DIAGNOSIS — N2581 Secondary hyperparathyroidism of renal origin: Secondary | ICD-10-CM | POA: Diagnosis not present

## 2017-05-06 DIAGNOSIS — N186 End stage renal disease: Secondary | ICD-10-CM | POA: Diagnosis not present

## 2017-05-08 ENCOUNTER — Other Ambulatory Visit: Payer: Self-pay | Admitting: Radiology

## 2017-05-08 DIAGNOSIS — E119 Type 2 diabetes mellitus without complications: Secondary | ICD-10-CM | POA: Diagnosis not present

## 2017-05-08 DIAGNOSIS — N2581 Secondary hyperparathyroidism of renal origin: Secondary | ICD-10-CM | POA: Diagnosis not present

## 2017-05-08 DIAGNOSIS — Z992 Dependence on renal dialysis: Secondary | ICD-10-CM | POA: Diagnosis not present

## 2017-05-08 DIAGNOSIS — N186 End stage renal disease: Secondary | ICD-10-CM | POA: Diagnosis not present

## 2017-05-10 DIAGNOSIS — N2581 Secondary hyperparathyroidism of renal origin: Secondary | ICD-10-CM | POA: Diagnosis not present

## 2017-05-10 DIAGNOSIS — Z992 Dependence on renal dialysis: Secondary | ICD-10-CM | POA: Diagnosis not present

## 2017-05-10 DIAGNOSIS — N186 End stage renal disease: Secondary | ICD-10-CM | POA: Diagnosis not present

## 2017-05-10 DIAGNOSIS — E119 Type 2 diabetes mellitus without complications: Secondary | ICD-10-CM | POA: Diagnosis not present

## 2017-05-12 ENCOUNTER — Encounter (HOSPITAL_COMMUNITY): Payer: Self-pay

## 2017-05-12 ENCOUNTER — Ambulatory Visit (HOSPITAL_COMMUNITY)
Admission: RE | Admit: 2017-05-12 | Discharge: 2017-05-12 | Disposition: A | Discharge status: Home / Self Care | Payer: Medicare Other | Source: Ambulatory Visit | Attending: Hematology and Oncology | Admitting: Hematology and Oncology

## 2017-05-12 DIAGNOSIS — C8338 Diffuse large B-cell lymphoma, lymph nodes of multiple sites: Secondary | ICD-10-CM | POA: Diagnosis not present

## 2017-05-12 DIAGNOSIS — R911 Solitary pulmonary nodule: Secondary | ICD-10-CM | POA: Insufficient documentation

## 2017-05-12 DIAGNOSIS — Z452 Encounter for adjustment and management of vascular access device: Secondary | ICD-10-CM | POA: Diagnosis not present

## 2017-05-12 DIAGNOSIS — Z9109 Other allergy status, other than to drugs and biological substances: Secondary | ICD-10-CM | POA: Insufficient documentation

## 2017-05-12 DIAGNOSIS — Z79890 Hormone replacement therapy (postmenopausal): Secondary | ICD-10-CM | POA: Diagnosis not present

## 2017-05-12 DIAGNOSIS — Z79899 Other long term (current) drug therapy: Secondary | ICD-10-CM | POA: Insufficient documentation

## 2017-05-12 DIAGNOSIS — Z7982 Long term (current) use of aspirin: Secondary | ICD-10-CM | POA: Insufficient documentation

## 2017-05-12 DIAGNOSIS — C8330 Diffuse large B-cell lymphoma, unspecified site: Secondary | ICD-10-CM | POA: Diagnosis not present

## 2017-05-12 HISTORY — PX: IR REMOVAL TUN ACCESS W/ PORT W/O FL MOD SED: IMG2290

## 2017-05-12 MED ORDER — SODIUM CHLORIDE 0.9 % IV SOLN: INTRAVENOUS | Status: DC

## 2017-05-12 MED ORDER — CEFAZOLIN SODIUM-DEXTROSE 2-4 GM/100ML-% IV SOLN
INTRAVENOUS | Status: AC
  Administered 2017-05-12: 2 g via INTRAVENOUS
  Filled 2017-05-12: qty 100

## 2017-05-12 MED ORDER — LIDOCAINE-EPINEPHRINE (PF) 2 %-1:200000 IJ SOLN
INTRAMUSCULAR | Status: DC | PRN
  Administered 2017-05-12: 10 mL via INTRADERMAL

## 2017-05-12 MED ORDER — CEFAZOLIN SODIUM-DEXTROSE 2-4 GM/100ML-% IV SOLN
2.0000 g | INTRAVENOUS | Status: AC
  Administered 2017-05-12: 2 g via INTRAVENOUS

## 2017-05-12 MED ORDER — LIDOCAINE-EPINEPHRINE (PF) 2 %-1:200000 IJ SOLN
INTRAMUSCULAR | Status: AC
  Filled 2017-05-12: qty 20

## 2017-05-12 NOTE — Progress Notes (Signed)
Patient ID: Javier Jenkins, male   DOB: 10/21/1950, 67 y.o.   MRN: [id number] Patient presents today for Port-A-Cath removal.  He has a history of diffuse large B-cell lymphoma and has completed treatment.  Details/risks of procedure, including but not limited to, internal bleeding, infection, injury to adjacent structures, discussed with patient with his understanding and consent.  He does not wish to have IV conscious sedation for procedure.

## 2017-05-12 NOTE — Procedures (Signed)
  Procedure: R IJ Port removal   EBL:   minimal Complications:  none immediate  See full dictation in [facility] PACS.  D. Quron Ruddy MD Main # [phone] Pager  [phone]    

## 2017-05-12 NOTE — Discharge Instructions (Addendum)
Sutured Wound Care Sutures are stitches that can be used to close wounds. Taking care of your wound properly can help to prevent pain and infection. It can also help your wound to heal more quickly. How is this treated? Wound Care  Keep the wound clean and dry.  If you were given a bandage (dressing), you should change it at least once per day or as directed by your health care provider. You should also change it if it becomes wet or dirty.  Keep the wound completely dry for the first 24 hours or as directed by your health care provider. After that time, you may shower or bathe. However, make sure that the wound is not soaked in water until the sutures have been removed. May remove dressing and shower in 1 to 2 days.  Clean the wound one time each day or as directed by your health care provider. ? Wash the wound with soap and water. ? Rinse the wound with water to remove all soap. ? Pat the wound dry with a clean towel. Do not rub the wound.  Aftercleaning the wound, apply a thin layer of antibioticointment as directed by your health care provider. This will help to prevent infection and keep the dressing from sticking to the wound.  Have the sutures removed as directed by your health care provider. General Instructions  Take or apply medicines only as directed by your health care provider.  To help prevent scarring, make sure to cover your wound with sunscreen whenever you are outside after the sutures are removed and the wound is healed. Make sure to wear a sunscreen of at least 30 SPF.  If you were prescribed an antibiotic medicine or ointment, finish all of it even if you start to feel better.  Do not scratch or pick at the wound.  Keep all follow-up visits as directed by your health care provider. This is important.  Check your wound every day for signs of infection. Watch for: ? Redness, swelling, or pain. ? Fluid, blood, or pus.  Raise (elevate) the injured area above the  level of your heart while you are sitting or lying down, if possible.  Avoid stretching your wound.  Drink enough fluids to keep your urine clear or pale yellow. Contact a health care provider if:  You received a tetanus shot and you have swelling, severe pain, redness, or bleeding at the injection site.  You have a fever.  A wound that was closed breaks open.  You notice a bad smell coming from the wound.  You notice something coming out of the wound, such as wood or glass.  Your pain is not controlled with medicine.  You have increased redness, swelling, or pain at the site of your wound.  You have fluid, blood, or pus coming from your wound.  You notice a change in the color of your skin near your wound.  You need to change the dressing frequently due to fluid, blood, or pus draining from the wound.  You develop a new rash.  You develop numbness around the wound. Get help right away if:  You develop severe swelling around the injury site.  Your pain suddenly increases and is severe.  You develop painful lumps near the wound or on skin that is anywhere on your body.  You have a red streak going away from your wound.  The wound is on your hand or foot and you cannot properly move a finger or toe.  The  wound is on your hand or foot and you notice that your fingers or toes look pale or bluish. This information is not intended to replace advice given to you by your health care provider. Make sure you discuss any questions you have with your health care provider. Document Released: 02/22/2004 Document Revised: 06/22/2015 Document Reviewed: 08/26/2012 Elsevier Interactive Patient Education  2017 [employer].

## 2017-05-13 DIAGNOSIS — E119 Type 2 diabetes mellitus without complications: Secondary | ICD-10-CM | POA: Diagnosis not present

## 2017-05-13 DIAGNOSIS — Z992 Dependence on renal dialysis: Secondary | ICD-10-CM | POA: Diagnosis not present

## 2017-05-13 DIAGNOSIS — N2581 Secondary hyperparathyroidism of renal origin: Secondary | ICD-10-CM | POA: Diagnosis not present

## 2017-05-13 DIAGNOSIS — N186 End stage renal disease: Secondary | ICD-10-CM | POA: Diagnosis not present

## 2017-05-15 DIAGNOSIS — Z992 Dependence on renal dialysis: Secondary | ICD-10-CM | POA: Diagnosis not present

## 2017-05-15 DIAGNOSIS — E119 Type 2 diabetes mellitus without complications: Secondary | ICD-10-CM | POA: Diagnosis not present

## 2017-05-15 DIAGNOSIS — N2581 Secondary hyperparathyroidism of renal origin: Secondary | ICD-10-CM | POA: Diagnosis not present

## 2017-05-15 DIAGNOSIS — N186 End stage renal disease: Secondary | ICD-10-CM | POA: Diagnosis not present

## 2017-05-17 DIAGNOSIS — Z992 Dependence on renal dialysis: Secondary | ICD-10-CM | POA: Diagnosis not present

## 2017-05-17 DIAGNOSIS — N186 End stage renal disease: Secondary | ICD-10-CM | POA: Diagnosis not present

## 2017-05-17 DIAGNOSIS — N2581 Secondary hyperparathyroidism of renal origin: Secondary | ICD-10-CM | POA: Diagnosis not present

## 2017-05-17 DIAGNOSIS — E119 Type 2 diabetes mellitus without complications: Secondary | ICD-10-CM | POA: Diagnosis not present

## 2017-05-20 DIAGNOSIS — E119 Type 2 diabetes mellitus without complications: Secondary | ICD-10-CM | POA: Diagnosis not present

## 2017-05-20 DIAGNOSIS — Z992 Dependence on renal dialysis: Secondary | ICD-10-CM | POA: Diagnosis not present

## 2017-05-20 DIAGNOSIS — N2581 Secondary hyperparathyroidism of renal origin: Secondary | ICD-10-CM | POA: Diagnosis not present

## 2017-05-20 DIAGNOSIS — N186 End stage renal disease: Secondary | ICD-10-CM | POA: Diagnosis not present

## 2017-05-22 DIAGNOSIS — Z992 Dependence on renal dialysis: Secondary | ICD-10-CM | POA: Diagnosis not present

## 2017-05-22 DIAGNOSIS — N2581 Secondary hyperparathyroidism of renal origin: Secondary | ICD-10-CM | POA: Diagnosis not present

## 2017-05-22 DIAGNOSIS — E119 Type 2 diabetes mellitus without complications: Secondary | ICD-10-CM | POA: Diagnosis not present

## 2017-05-22 DIAGNOSIS — N186 End stage renal disease: Secondary | ICD-10-CM | POA: Diagnosis not present

## 2017-05-24 DIAGNOSIS — Z992 Dependence on renal dialysis: Secondary | ICD-10-CM | POA: Diagnosis not present

## 2017-05-24 DIAGNOSIS — N186 End stage renal disease: Secondary | ICD-10-CM | POA: Diagnosis not present

## 2017-05-24 DIAGNOSIS — E119 Type 2 diabetes mellitus without complications: Secondary | ICD-10-CM | POA: Diagnosis not present

## 2017-05-24 DIAGNOSIS — N2581 Secondary hyperparathyroidism of renal origin: Secondary | ICD-10-CM | POA: Diagnosis not present

## 2017-05-25 IMAGING — CT CT CHEST W/O CM
2 of 4 series · 14 of 36 positions shown, 17 images · non-contrast
Comparison: PET-CT 11/27/2015

CLINICAL DATA: Patient with history of B-cell lymphoma. Status post
chemotherapy. Follow-up evaluation.

EXAM:
CT CHEST, ABDOMEN AND PELVIS WITHOUT CONTRAST
TECHNIQUE: Multidetector CT imaging of the chest, abdomen and pelvis was
performed following the standard protocol without IV contrast.

[Series 2: cap w/o · axial · non-contrast · 0.77mm/px · z∈[-809,-274]mm · 11 of 129 slices shown, 14 images]
[im 11/129  mediastinal]
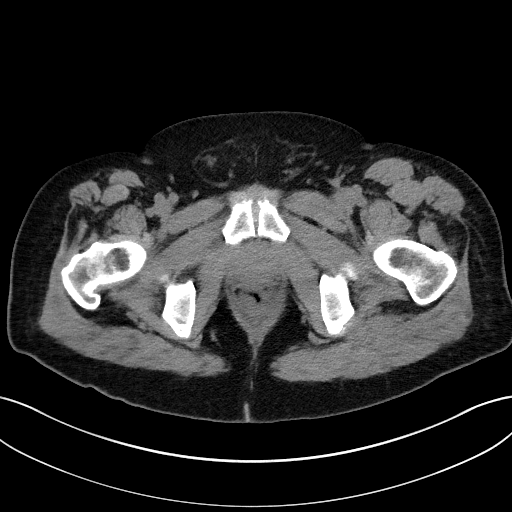
[im 11/129  lung]
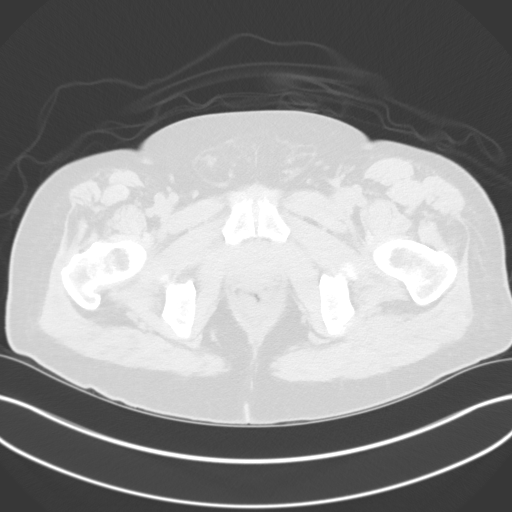
[im 22/129  lung]
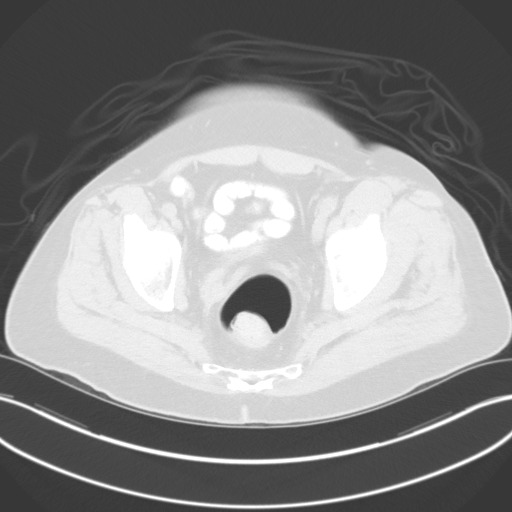
[im 33/129  lung]
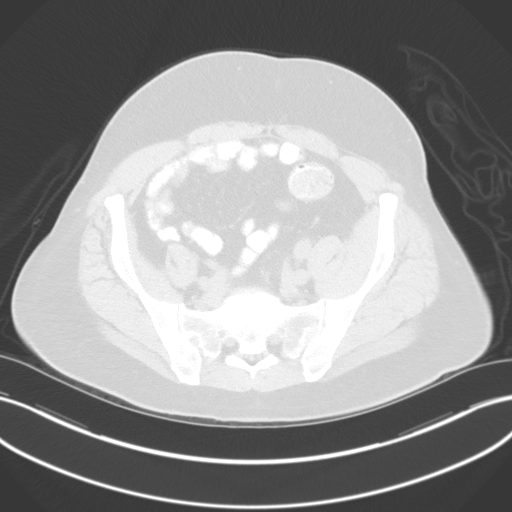
[im 43/129  lung]
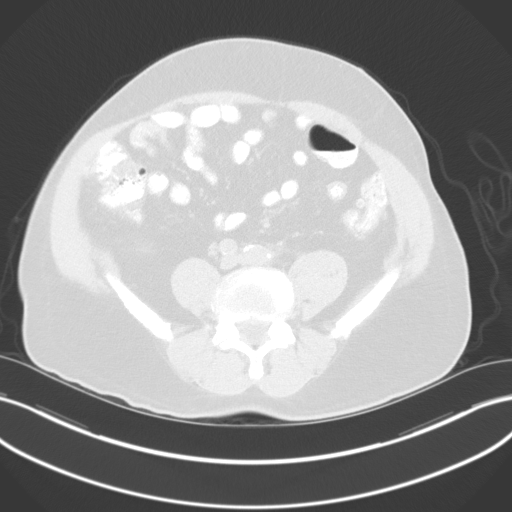
[im 54/129  mediastinal]
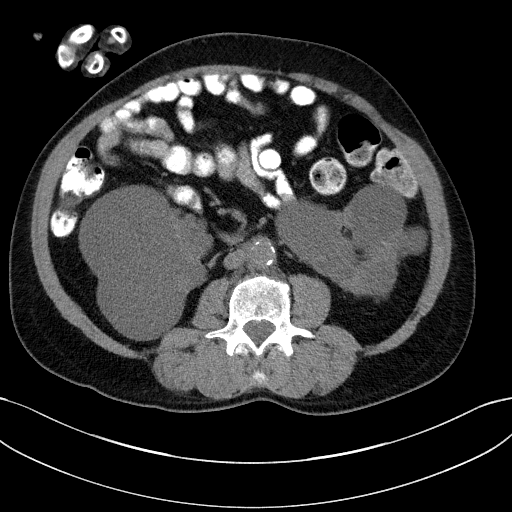
[im 54/129  lung]
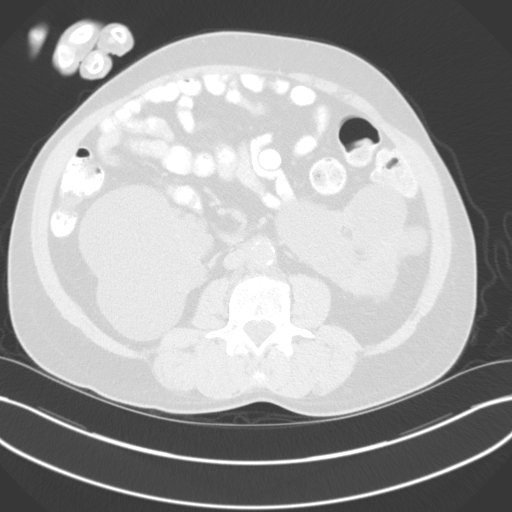
[im 65/129  lung]
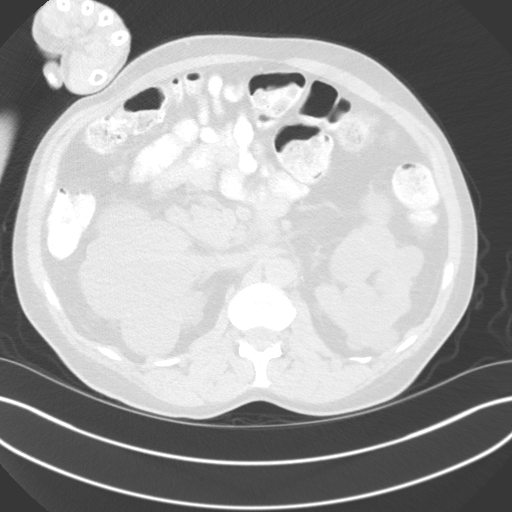
[im 75/129  lung]
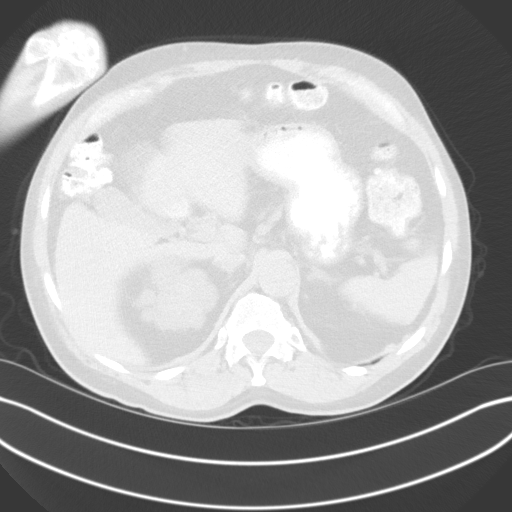
[im 86/129  lung]
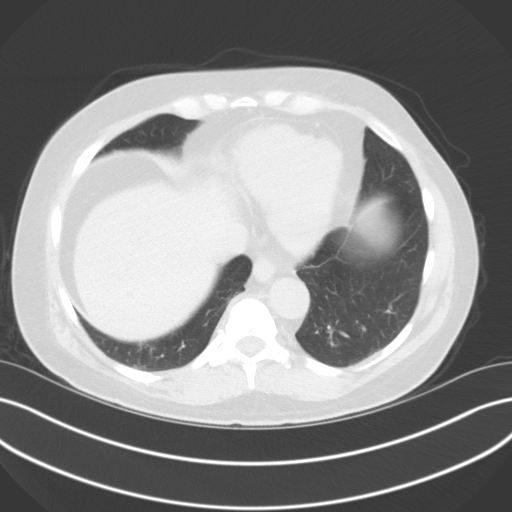
[im 97/129  mediastinal]
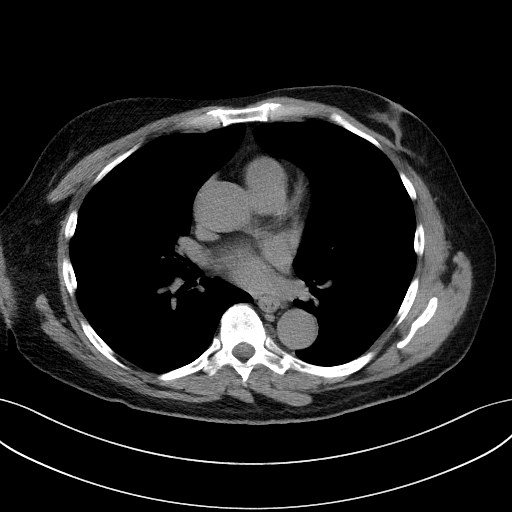
[im 97/129  lung]
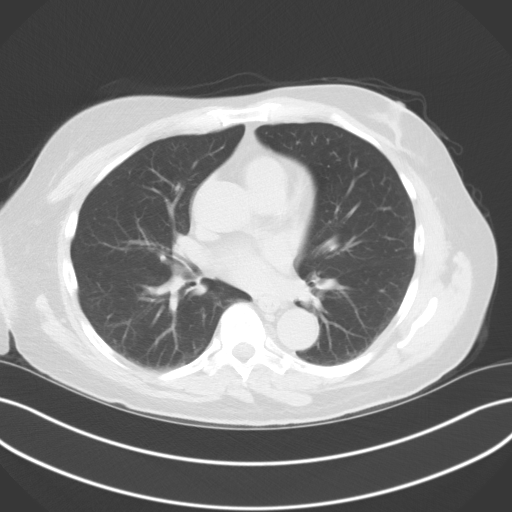
[im 107/129  lung]
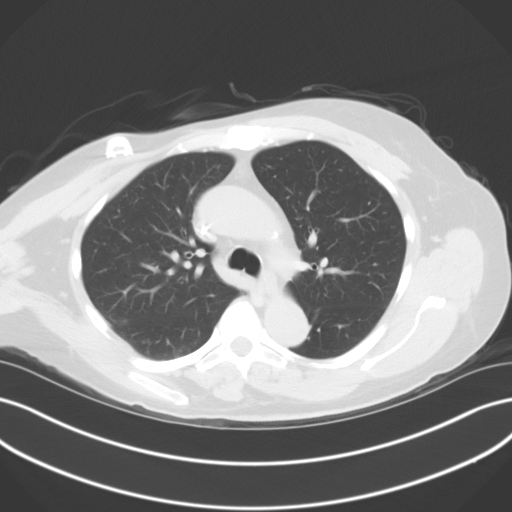
[im 118/129  lung]
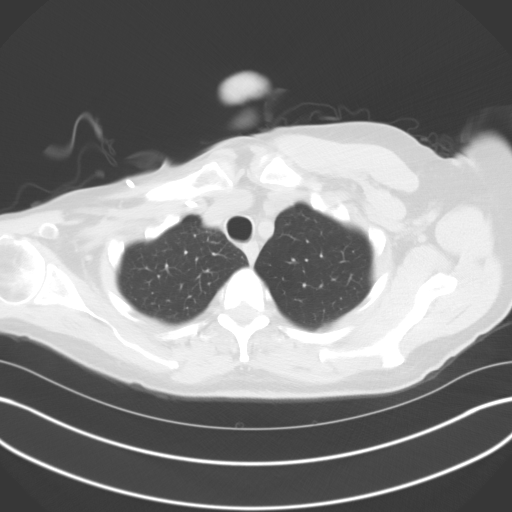

[Series 5: coronals · coronal · 0.84mm/px · 3 of 163 slices shown]
[im 33/163  lung]
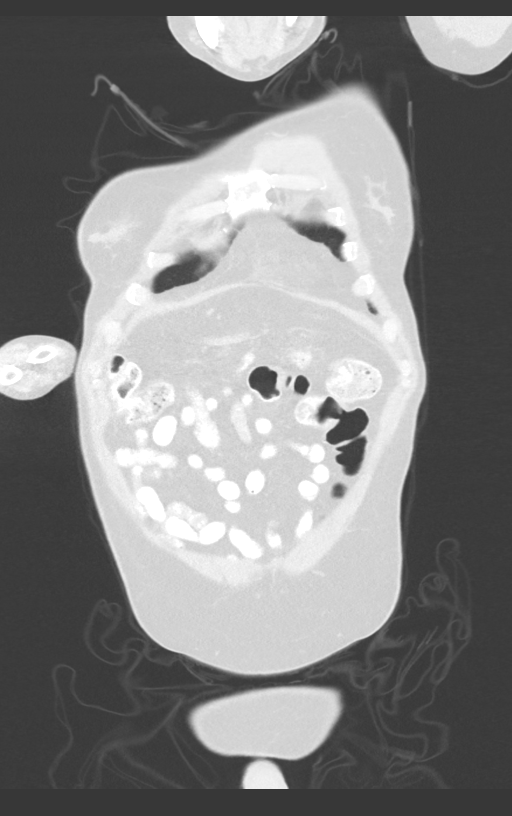
[im 65/163  lung]
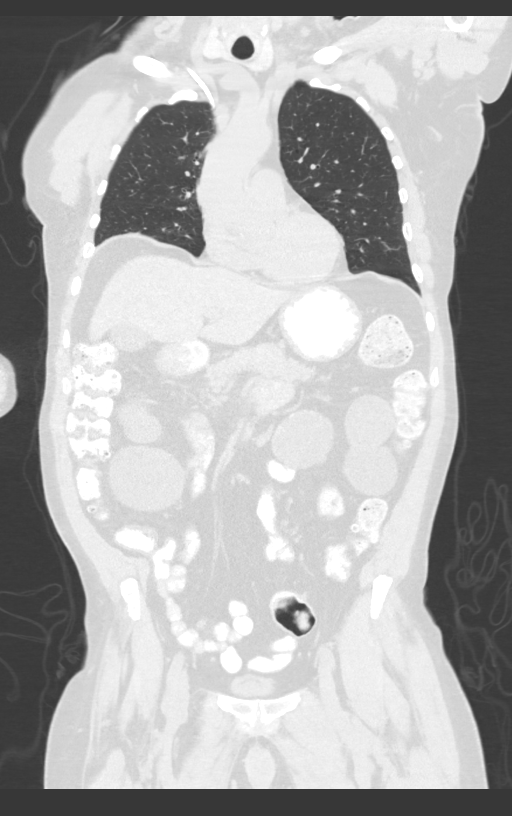
[im 98/163  lung]
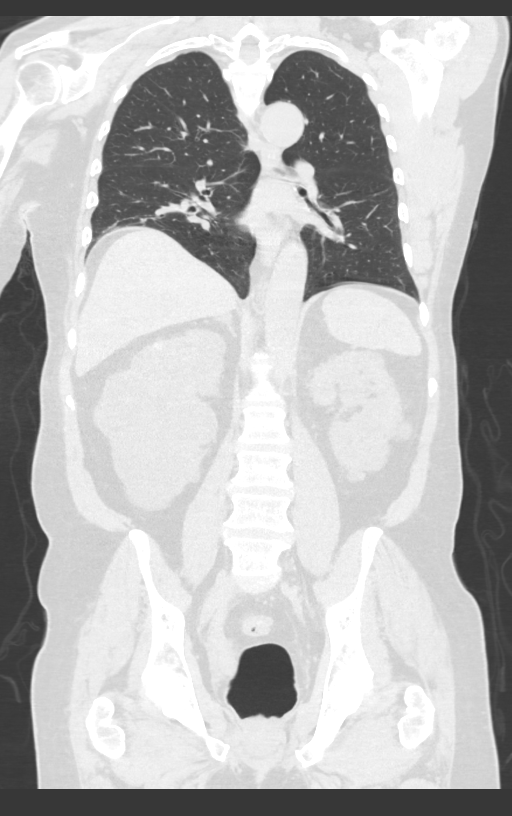

[14 of 36 positions shown; findings below may reference images not displayed]

FINDINGS: CT CHEST FINDINGS

Cardiovascular: Right anterior chest wall Port-A-Cath is present
with tip terminating in the superior vena cava. New normal heart
size. No pericardial effusion. Coronary arterial vascular
calcifications.

Mediastinum/Nodes: No enlarged axillary, mediastinal or hilar
lymphadenopathy. Esophagus is normal in caliber. Small hiatal
hernia.

Lungs/Pleura: Central airways are patent. Dependent atelectasis
within the lower lobes bilaterally. No large area of pulmonary
consolidation. No pleural effusion or pneumothorax.

Musculoskeletal: Thoracic spine degenerative changes. No aggressive
or acute appearing osseous lesions.

CT ABDOMEN PELVIS FINDINGS

Hepatobiliary: Liver is normal in size and contour. Gallbladder is
unremarkable.

Pancreas: Unremarkable

Spleen: Unremarkable

Adrenals/Urinary Tract: The adrenal glands are normal. Findings
compatible with polycystic kidney disease bilaterally. Urinary
bladder is decompressed.

Stomach/Bowel: No abnormal bowel wall thickening or evidence for
bowel obstruction. No free fluid or free intraperitoneal air. Normal
morphology of the stomach. The appendix is normal.

Vascular/Lymphatic: Normal caliber abdominal aorta. Peripheral
calcified atherosclerotic plaque. Unchanged 6 mm left periaortic
lymph node (image 80; series 2). Similar appearance of the
aortocaval soft tissue compatible with history of treated lymphoma.

Reproductive: Process unremarkable.

Other: Bilateral fat containing inguinal hernias.

Musculoskeletal: Lumbar spine degenerative changes. No aggressive or
acute appearing osseous lesions.
IMPRESSION: Unchanged retroperitoneal soft tissue most compatible with history
of treated lymphoma. No new or enlarging adenopathy in the chest,
abdomen or pelvis.

Polycystic renal disease.

Bilateral fat containing inguinal hernias.

Aortic atherosclerosis.

## 2017-05-27 DIAGNOSIS — N186 End stage renal disease: Secondary | ICD-10-CM | POA: Diagnosis not present

## 2017-05-27 DIAGNOSIS — Z992 Dependence on renal dialysis: Secondary | ICD-10-CM | POA: Diagnosis not present

## 2017-05-27 DIAGNOSIS — E119 Type 2 diabetes mellitus without complications: Secondary | ICD-10-CM | POA: Diagnosis not present

## 2017-05-27 DIAGNOSIS — N2581 Secondary hyperparathyroidism of renal origin: Secondary | ICD-10-CM | POA: Diagnosis not present

## 2017-05-28 DIAGNOSIS — Z992 Dependence on renal dialysis: Secondary | ICD-10-CM | POA: Diagnosis not present

## 2017-05-28 DIAGNOSIS — I129 Hypertensive chronic kidney disease with stage 1 through stage 4 chronic kidney disease, or unspecified chronic kidney disease: Secondary | ICD-10-CM | POA: Diagnosis not present

## 2017-05-28 DIAGNOSIS — N186 End stage renal disease: Secondary | ICD-10-CM | POA: Diagnosis not present

## 2017-05-29 DIAGNOSIS — Z992 Dependence on renal dialysis: Secondary | ICD-10-CM | POA: Diagnosis not present

## 2017-05-29 DIAGNOSIS — E119 Type 2 diabetes mellitus without complications: Secondary | ICD-10-CM | POA: Diagnosis not present

## 2017-05-29 DIAGNOSIS — D509 Iron deficiency anemia, unspecified: Secondary | ICD-10-CM | POA: Diagnosis not present

## 2017-05-29 DIAGNOSIS — N2581 Secondary hyperparathyroidism of renal origin: Secondary | ICD-10-CM | POA: Diagnosis not present

## 2017-05-29 DIAGNOSIS — N186 End stage renal disease: Secondary | ICD-10-CM | POA: Diagnosis not present

## 2017-05-31 DIAGNOSIS — D509 Iron deficiency anemia, unspecified: Secondary | ICD-10-CM | POA: Diagnosis not present

## 2017-05-31 DIAGNOSIS — N186 End stage renal disease: Secondary | ICD-10-CM | POA: Diagnosis not present

## 2017-05-31 DIAGNOSIS — Z992 Dependence on renal dialysis: Secondary | ICD-10-CM | POA: Diagnosis not present

## 2017-05-31 DIAGNOSIS — N2581 Secondary hyperparathyroidism of renal origin: Secondary | ICD-10-CM | POA: Diagnosis not present

## 2017-05-31 DIAGNOSIS — E119 Type 2 diabetes mellitus without complications: Secondary | ICD-10-CM | POA: Diagnosis not present

## 2017-06-03 DIAGNOSIS — D509 Iron deficiency anemia, unspecified: Secondary | ICD-10-CM | POA: Diagnosis not present

## 2017-06-03 DIAGNOSIS — Z992 Dependence on renal dialysis: Secondary | ICD-10-CM | POA: Diagnosis not present

## 2017-06-03 DIAGNOSIS — N2581 Secondary hyperparathyroidism of renal origin: Secondary | ICD-10-CM | POA: Diagnosis not present

## 2017-06-03 DIAGNOSIS — N186 End stage renal disease: Secondary | ICD-10-CM | POA: Diagnosis not present

## 2017-06-03 DIAGNOSIS — E119 Type 2 diabetes mellitus without complications: Secondary | ICD-10-CM | POA: Diagnosis not present

## 2017-06-05 DIAGNOSIS — E119 Type 2 diabetes mellitus without complications: Secondary | ICD-10-CM | POA: Diagnosis not present

## 2017-06-05 DIAGNOSIS — N2581 Secondary hyperparathyroidism of renal origin: Secondary | ICD-10-CM | POA: Diagnosis not present

## 2017-06-05 DIAGNOSIS — N186 End stage renal disease: Secondary | ICD-10-CM | POA: Diagnosis not present

## 2017-06-05 DIAGNOSIS — Z992 Dependence on renal dialysis: Secondary | ICD-10-CM | POA: Diagnosis not present

## 2017-06-05 DIAGNOSIS — D509 Iron deficiency anemia, unspecified: Secondary | ICD-10-CM | POA: Diagnosis not present

## 2017-06-07 DIAGNOSIS — N186 End stage renal disease: Secondary | ICD-10-CM | POA: Diagnosis not present

## 2017-06-07 DIAGNOSIS — E119 Type 2 diabetes mellitus without complications: Secondary | ICD-10-CM | POA: Diagnosis not present

## 2017-06-07 DIAGNOSIS — N2581 Secondary hyperparathyroidism of renal origin: Secondary | ICD-10-CM | POA: Diagnosis not present

## 2017-06-07 DIAGNOSIS — Z992 Dependence on renal dialysis: Secondary | ICD-10-CM | POA: Diagnosis not present

## 2017-06-07 DIAGNOSIS — D509 Iron deficiency anemia, unspecified: Secondary | ICD-10-CM | POA: Diagnosis not present

## 2017-06-10 DIAGNOSIS — N2581 Secondary hyperparathyroidism of renal origin: Secondary | ICD-10-CM | POA: Diagnosis not present

## 2017-06-10 DIAGNOSIS — N186 End stage renal disease: Secondary | ICD-10-CM | POA: Diagnosis not present

## 2017-06-10 DIAGNOSIS — E119 Type 2 diabetes mellitus without complications: Secondary | ICD-10-CM | POA: Diagnosis not present

## 2017-06-10 DIAGNOSIS — Z992 Dependence on renal dialysis: Secondary | ICD-10-CM | POA: Diagnosis not present

## 2017-06-10 DIAGNOSIS — D509 Iron deficiency anemia, unspecified: Secondary | ICD-10-CM | POA: Diagnosis not present

## 2017-06-12 DIAGNOSIS — Z992 Dependence on renal dialysis: Secondary | ICD-10-CM | POA: Diagnosis not present

## 2017-06-12 DIAGNOSIS — E119 Type 2 diabetes mellitus without complications: Secondary | ICD-10-CM | POA: Diagnosis not present

## 2017-06-12 DIAGNOSIS — N2581 Secondary hyperparathyroidism of renal origin: Secondary | ICD-10-CM | POA: Diagnosis not present

## 2017-06-12 DIAGNOSIS — N186 End stage renal disease: Secondary | ICD-10-CM | POA: Diagnosis not present

## 2017-06-12 DIAGNOSIS — D509 Iron deficiency anemia, unspecified: Secondary | ICD-10-CM | POA: Diagnosis not present

## 2017-06-14 DIAGNOSIS — Z992 Dependence on renal dialysis: Secondary | ICD-10-CM | POA: Diagnosis not present

## 2017-06-14 DIAGNOSIS — E119 Type 2 diabetes mellitus without complications: Secondary | ICD-10-CM | POA: Diagnosis not present

## 2017-06-14 DIAGNOSIS — D509 Iron deficiency anemia, unspecified: Secondary | ICD-10-CM | POA: Diagnosis not present

## 2017-06-14 DIAGNOSIS — N186 End stage renal disease: Secondary | ICD-10-CM | POA: Diagnosis not present

## 2017-06-14 DIAGNOSIS — N2581 Secondary hyperparathyroidism of renal origin: Secondary | ICD-10-CM | POA: Diagnosis not present

## 2017-06-17 DIAGNOSIS — D509 Iron deficiency anemia, unspecified: Secondary | ICD-10-CM | POA: Diagnosis not present

## 2017-06-17 DIAGNOSIS — E119 Type 2 diabetes mellitus without complications: Secondary | ICD-10-CM | POA: Diagnosis not present

## 2017-06-17 DIAGNOSIS — Z992 Dependence on renal dialysis: Secondary | ICD-10-CM | POA: Diagnosis not present

## 2017-06-17 DIAGNOSIS — N2581 Secondary hyperparathyroidism of renal origin: Secondary | ICD-10-CM | POA: Diagnosis not present

## 2017-06-17 DIAGNOSIS — N186 End stage renal disease: Secondary | ICD-10-CM | POA: Diagnosis not present

## 2017-06-19 DIAGNOSIS — D509 Iron deficiency anemia, unspecified: Secondary | ICD-10-CM | POA: Diagnosis not present

## 2017-06-19 DIAGNOSIS — Z992 Dependence on renal dialysis: Secondary | ICD-10-CM | POA: Diagnosis not present

## 2017-06-19 DIAGNOSIS — N2581 Secondary hyperparathyroidism of renal origin: Secondary | ICD-10-CM | POA: Diagnosis not present

## 2017-06-19 DIAGNOSIS — N186 End stage renal disease: Secondary | ICD-10-CM | POA: Diagnosis not present

## 2017-06-19 DIAGNOSIS — E119 Type 2 diabetes mellitus without complications: Secondary | ICD-10-CM | POA: Diagnosis not present

## 2017-06-21 DIAGNOSIS — N2581 Secondary hyperparathyroidism of renal origin: Secondary | ICD-10-CM | POA: Diagnosis not present

## 2017-06-21 DIAGNOSIS — N186 End stage renal disease: Secondary | ICD-10-CM | POA: Diagnosis not present

## 2017-06-21 DIAGNOSIS — E119 Type 2 diabetes mellitus without complications: Secondary | ICD-10-CM | POA: Diagnosis not present

## 2017-06-21 DIAGNOSIS — D509 Iron deficiency anemia, unspecified: Secondary | ICD-10-CM | POA: Diagnosis not present

## 2017-06-21 DIAGNOSIS — Z992 Dependence on renal dialysis: Secondary | ICD-10-CM | POA: Diagnosis not present

## 2017-06-24 DIAGNOSIS — D509 Iron deficiency anemia, unspecified: Secondary | ICD-10-CM | POA: Diagnosis not present

## 2017-06-24 DIAGNOSIS — Z992 Dependence on renal dialysis: Secondary | ICD-10-CM | POA: Diagnosis not present

## 2017-06-24 DIAGNOSIS — N186 End stage renal disease: Secondary | ICD-10-CM | POA: Diagnosis not present

## 2017-06-24 DIAGNOSIS — E119 Type 2 diabetes mellitus without complications: Secondary | ICD-10-CM | POA: Diagnosis not present

## 2017-06-24 DIAGNOSIS — N2581 Secondary hyperparathyroidism of renal origin: Secondary | ICD-10-CM | POA: Diagnosis not present

## 2017-06-26 DIAGNOSIS — N186 End stage renal disease: Secondary | ICD-10-CM | POA: Diagnosis not present

## 2017-06-26 DIAGNOSIS — Z992 Dependence on renal dialysis: Secondary | ICD-10-CM | POA: Diagnosis not present

## 2017-06-26 DIAGNOSIS — E119 Type 2 diabetes mellitus without complications: Secondary | ICD-10-CM | POA: Diagnosis not present

## 2017-06-26 DIAGNOSIS — N2581 Secondary hyperparathyroidism of renal origin: Secondary | ICD-10-CM | POA: Diagnosis not present

## 2017-06-26 DIAGNOSIS — D509 Iron deficiency anemia, unspecified: Secondary | ICD-10-CM | POA: Diagnosis not present

## 2017-06-28 DIAGNOSIS — N186 End stage renal disease: Secondary | ICD-10-CM | POA: Diagnosis not present

## 2017-06-28 DIAGNOSIS — N2581 Secondary hyperparathyroidism of renal origin: Secondary | ICD-10-CM | POA: Diagnosis not present

## 2017-06-28 DIAGNOSIS — E119 Type 2 diabetes mellitus without complications: Secondary | ICD-10-CM | POA: Diagnosis not present

## 2017-06-28 DIAGNOSIS — D509 Iron deficiency anemia, unspecified: Secondary | ICD-10-CM | POA: Diagnosis not present

## 2017-06-28 DIAGNOSIS — I129 Hypertensive chronic kidney disease with stage 1 through stage 4 chronic kidney disease, or unspecified chronic kidney disease: Secondary | ICD-10-CM | POA: Diagnosis not present

## 2017-06-28 DIAGNOSIS — Z992 Dependence on renal dialysis: Secondary | ICD-10-CM | POA: Diagnosis not present

## 2017-07-01 DIAGNOSIS — N2581 Secondary hyperparathyroidism of renal origin: Secondary | ICD-10-CM | POA: Diagnosis not present

## 2017-07-01 DIAGNOSIS — Z992 Dependence on renal dialysis: Secondary | ICD-10-CM | POA: Diagnosis not present

## 2017-07-01 DIAGNOSIS — E119 Type 2 diabetes mellitus without complications: Secondary | ICD-10-CM | POA: Diagnosis not present

## 2017-07-01 DIAGNOSIS — N186 End stage renal disease: Secondary | ICD-10-CM | POA: Diagnosis not present

## 2017-07-01 DIAGNOSIS — D509 Iron deficiency anemia, unspecified: Secondary | ICD-10-CM | POA: Diagnosis not present

## 2017-07-03 DIAGNOSIS — E119 Type 2 diabetes mellitus without complications: Secondary | ICD-10-CM | POA: Diagnosis not present

## 2017-07-03 DIAGNOSIS — N186 End stage renal disease: Secondary | ICD-10-CM | POA: Diagnosis not present

## 2017-07-03 DIAGNOSIS — D509 Iron deficiency anemia, unspecified: Secondary | ICD-10-CM | POA: Diagnosis not present

## 2017-07-03 DIAGNOSIS — Z992 Dependence on renal dialysis: Secondary | ICD-10-CM | POA: Diagnosis not present

## 2017-07-03 DIAGNOSIS — N2581 Secondary hyperparathyroidism of renal origin: Secondary | ICD-10-CM | POA: Diagnosis not present

## 2017-07-05 DIAGNOSIS — D509 Iron deficiency anemia, unspecified: Secondary | ICD-10-CM | POA: Diagnosis not present

## 2017-07-05 DIAGNOSIS — Z992 Dependence on renal dialysis: Secondary | ICD-10-CM | POA: Diagnosis not present

## 2017-07-05 DIAGNOSIS — E119 Type 2 diabetes mellitus without complications: Secondary | ICD-10-CM | POA: Diagnosis not present

## 2017-07-05 DIAGNOSIS — N186 End stage renal disease: Secondary | ICD-10-CM | POA: Diagnosis not present

## 2017-07-05 DIAGNOSIS — N2581 Secondary hyperparathyroidism of renal origin: Secondary | ICD-10-CM | POA: Diagnosis not present

## 2017-07-08 DIAGNOSIS — D509 Iron deficiency anemia, unspecified: Secondary | ICD-10-CM | POA: Diagnosis not present

## 2017-07-08 DIAGNOSIS — N2581 Secondary hyperparathyroidism of renal origin: Secondary | ICD-10-CM | POA: Diagnosis not present

## 2017-07-08 DIAGNOSIS — Z992 Dependence on renal dialysis: Secondary | ICD-10-CM | POA: Diagnosis not present

## 2017-07-08 DIAGNOSIS — N186 End stage renal disease: Secondary | ICD-10-CM | POA: Diagnosis not present

## 2017-07-08 DIAGNOSIS — E119 Type 2 diabetes mellitus without complications: Secondary | ICD-10-CM | POA: Diagnosis not present

## 2017-07-10 DIAGNOSIS — N2581 Secondary hyperparathyroidism of renal origin: Secondary | ICD-10-CM | POA: Diagnosis not present

## 2017-07-10 DIAGNOSIS — N186 End stage renal disease: Secondary | ICD-10-CM | POA: Diagnosis not present

## 2017-07-10 DIAGNOSIS — Z992 Dependence on renal dialysis: Secondary | ICD-10-CM | POA: Diagnosis not present

## 2017-07-10 DIAGNOSIS — D509 Iron deficiency anemia, unspecified: Secondary | ICD-10-CM | POA: Diagnosis not present

## 2017-07-10 DIAGNOSIS — E119 Type 2 diabetes mellitus without complications: Secondary | ICD-10-CM | POA: Diagnosis not present

## 2017-07-12 DIAGNOSIS — N2581 Secondary hyperparathyroidism of renal origin: Secondary | ICD-10-CM | POA: Diagnosis not present

## 2017-07-12 DIAGNOSIS — E119 Type 2 diabetes mellitus without complications: Secondary | ICD-10-CM | POA: Diagnosis not present

## 2017-07-12 DIAGNOSIS — D509 Iron deficiency anemia, unspecified: Secondary | ICD-10-CM | POA: Diagnosis not present

## 2017-07-12 DIAGNOSIS — N186 End stage renal disease: Secondary | ICD-10-CM | POA: Diagnosis not present

## 2017-07-12 DIAGNOSIS — Z992 Dependence on renal dialysis: Secondary | ICD-10-CM | POA: Diagnosis not present

## 2017-07-15 DIAGNOSIS — E119 Type 2 diabetes mellitus without complications: Secondary | ICD-10-CM | POA: Diagnosis not present

## 2017-07-15 DIAGNOSIS — D509 Iron deficiency anemia, unspecified: Secondary | ICD-10-CM | POA: Diagnosis not present

## 2017-07-15 DIAGNOSIS — Z992 Dependence on renal dialysis: Secondary | ICD-10-CM | POA: Diagnosis not present

## 2017-07-15 DIAGNOSIS — N2581 Secondary hyperparathyroidism of renal origin: Secondary | ICD-10-CM | POA: Diagnosis not present

## 2017-07-15 DIAGNOSIS — N186 End stage renal disease: Secondary | ICD-10-CM | POA: Diagnosis not present

## 2017-07-17 DIAGNOSIS — N2581 Secondary hyperparathyroidism of renal origin: Secondary | ICD-10-CM | POA: Diagnosis not present

## 2017-07-17 DIAGNOSIS — N186 End stage renal disease: Secondary | ICD-10-CM | POA: Diagnosis not present

## 2017-07-17 DIAGNOSIS — Z992 Dependence on renal dialysis: Secondary | ICD-10-CM | POA: Diagnosis not present

## 2017-07-17 DIAGNOSIS — D509 Iron deficiency anemia, unspecified: Secondary | ICD-10-CM | POA: Diagnosis not present

## 2017-07-17 DIAGNOSIS — E119 Type 2 diabetes mellitus without complications: Secondary | ICD-10-CM | POA: Diagnosis not present

## 2017-07-19 DIAGNOSIS — E119 Type 2 diabetes mellitus without complications: Secondary | ICD-10-CM | POA: Diagnosis not present

## 2017-07-19 DIAGNOSIS — D509 Iron deficiency anemia, unspecified: Secondary | ICD-10-CM | POA: Diagnosis not present

## 2017-07-19 DIAGNOSIS — Z992 Dependence on renal dialysis: Secondary | ICD-10-CM | POA: Diagnosis not present

## 2017-07-19 DIAGNOSIS — N2581 Secondary hyperparathyroidism of renal origin: Secondary | ICD-10-CM | POA: Diagnosis not present

## 2017-07-19 DIAGNOSIS — N186 End stage renal disease: Secondary | ICD-10-CM | POA: Diagnosis not present

## 2017-07-21 DIAGNOSIS — Z992 Dependence on renal dialysis: Secondary | ICD-10-CM | POA: Diagnosis not present

## 2017-07-21 DIAGNOSIS — I871 Compression of vein: Secondary | ICD-10-CM | POA: Diagnosis not present

## 2017-07-21 DIAGNOSIS — T82858A Stenosis of vascular prosthetic devices, implants and grafts, initial encounter: Secondary | ICD-10-CM | POA: Diagnosis not present

## 2017-07-21 DIAGNOSIS — N186 End stage renal disease: Secondary | ICD-10-CM | POA: Diagnosis not present

## 2017-07-22 DIAGNOSIS — N2581 Secondary hyperparathyroidism of renal origin: Secondary | ICD-10-CM | POA: Diagnosis not present

## 2017-07-22 DIAGNOSIS — D509 Iron deficiency anemia, unspecified: Secondary | ICD-10-CM | POA: Diagnosis not present

## 2017-07-22 DIAGNOSIS — N186 End stage renal disease: Secondary | ICD-10-CM | POA: Diagnosis not present

## 2017-07-22 DIAGNOSIS — Z992 Dependence on renal dialysis: Secondary | ICD-10-CM | POA: Diagnosis not present

## 2017-07-22 DIAGNOSIS — E119 Type 2 diabetes mellitus without complications: Secondary | ICD-10-CM | POA: Diagnosis not present

## 2017-07-24 DIAGNOSIS — D509 Iron deficiency anemia, unspecified: Secondary | ICD-10-CM | POA: Diagnosis not present

## 2017-07-24 DIAGNOSIS — E119 Type 2 diabetes mellitus without complications: Secondary | ICD-10-CM | POA: Diagnosis not present

## 2017-07-24 DIAGNOSIS — N2581 Secondary hyperparathyroidism of renal origin: Secondary | ICD-10-CM | POA: Diagnosis not present

## 2017-07-24 DIAGNOSIS — Z992 Dependence on renal dialysis: Secondary | ICD-10-CM | POA: Diagnosis not present

## 2017-07-24 DIAGNOSIS — N186 End stage renal disease: Secondary | ICD-10-CM | POA: Diagnosis not present

## 2017-07-26 DIAGNOSIS — Z992 Dependence on renal dialysis: Secondary | ICD-10-CM | POA: Diagnosis not present

## 2017-07-26 DIAGNOSIS — N186 End stage renal disease: Secondary | ICD-10-CM | POA: Diagnosis not present

## 2017-07-26 DIAGNOSIS — E119 Type 2 diabetes mellitus without complications: Secondary | ICD-10-CM | POA: Diagnosis not present

## 2017-07-26 DIAGNOSIS — D509 Iron deficiency anemia, unspecified: Secondary | ICD-10-CM | POA: Diagnosis not present

## 2017-07-26 DIAGNOSIS — N2581 Secondary hyperparathyroidism of renal origin: Secondary | ICD-10-CM | POA: Diagnosis not present

## 2017-07-28 DIAGNOSIS — Z992 Dependence on renal dialysis: Secondary | ICD-10-CM | POA: Diagnosis not present

## 2017-07-28 DIAGNOSIS — I129 Hypertensive chronic kidney disease with stage 1 through stage 4 chronic kidney disease, or unspecified chronic kidney disease: Secondary | ICD-10-CM | POA: Diagnosis not present

## 2017-07-28 DIAGNOSIS — N186 End stage renal disease: Secondary | ICD-10-CM | POA: Diagnosis not present

## 2017-07-29 DIAGNOSIS — N186 End stage renal disease: Secondary | ICD-10-CM | POA: Diagnosis not present

## 2017-07-29 DIAGNOSIS — D509 Iron deficiency anemia, unspecified: Secondary | ICD-10-CM | POA: Diagnosis not present

## 2017-07-29 DIAGNOSIS — Z992 Dependence on renal dialysis: Secondary | ICD-10-CM | POA: Diagnosis not present

## 2017-07-29 DIAGNOSIS — E119 Type 2 diabetes mellitus without complications: Secondary | ICD-10-CM | POA: Diagnosis not present

## 2017-07-29 DIAGNOSIS — N2581 Secondary hyperparathyroidism of renal origin: Secondary | ICD-10-CM | POA: Diagnosis not present

## 2017-07-30 DIAGNOSIS — L602 Onychogryphosis: Secondary | ICD-10-CM | POA: Diagnosis not present

## 2017-07-30 DIAGNOSIS — E1351 Other specified diabetes mellitus with diabetic peripheral angiopathy without gangrene: Secondary | ICD-10-CM | POA: Diagnosis not present

## 2017-07-31 DIAGNOSIS — N186 End stage renal disease: Secondary | ICD-10-CM | POA: Diagnosis not present

## 2017-07-31 DIAGNOSIS — D509 Iron deficiency anemia, unspecified: Secondary | ICD-10-CM | POA: Diagnosis not present

## 2017-07-31 DIAGNOSIS — N2581 Secondary hyperparathyroidism of renal origin: Secondary | ICD-10-CM | POA: Diagnosis not present

## 2017-07-31 DIAGNOSIS — E119 Type 2 diabetes mellitus without complications: Secondary | ICD-10-CM | POA: Diagnosis not present

## 2017-07-31 DIAGNOSIS — Z992 Dependence on renal dialysis: Secondary | ICD-10-CM | POA: Diagnosis not present

## 2017-08-02 DIAGNOSIS — N186 End stage renal disease: Secondary | ICD-10-CM | POA: Diagnosis not present

## 2017-08-02 DIAGNOSIS — E119 Type 2 diabetes mellitus without complications: Secondary | ICD-10-CM | POA: Diagnosis not present

## 2017-08-02 DIAGNOSIS — Z992 Dependence on renal dialysis: Secondary | ICD-10-CM | POA: Diagnosis not present

## 2017-08-02 DIAGNOSIS — N2581 Secondary hyperparathyroidism of renal origin: Secondary | ICD-10-CM | POA: Diagnosis not present

## 2017-08-02 DIAGNOSIS — D509 Iron deficiency anemia, unspecified: Secondary | ICD-10-CM | POA: Diagnosis not present

## 2017-08-05 DIAGNOSIS — N186 End stage renal disease: Secondary | ICD-10-CM | POA: Diagnosis not present

## 2017-08-05 DIAGNOSIS — E119 Type 2 diabetes mellitus without complications: Secondary | ICD-10-CM | POA: Diagnosis not present

## 2017-08-05 DIAGNOSIS — N2581 Secondary hyperparathyroidism of renal origin: Secondary | ICD-10-CM | POA: Diagnosis not present

## 2017-08-05 DIAGNOSIS — Z992 Dependence on renal dialysis: Secondary | ICD-10-CM | POA: Diagnosis not present

## 2017-08-05 DIAGNOSIS — D509 Iron deficiency anemia, unspecified: Secondary | ICD-10-CM | POA: Diagnosis not present

## 2017-08-07 DIAGNOSIS — D509 Iron deficiency anemia, unspecified: Secondary | ICD-10-CM | POA: Diagnosis not present

## 2017-08-07 DIAGNOSIS — E119 Type 2 diabetes mellitus without complications: Secondary | ICD-10-CM | POA: Diagnosis not present

## 2017-08-07 DIAGNOSIS — N186 End stage renal disease: Secondary | ICD-10-CM | POA: Diagnosis not present

## 2017-08-07 DIAGNOSIS — N2581 Secondary hyperparathyroidism of renal origin: Secondary | ICD-10-CM | POA: Diagnosis not present

## 2017-08-07 DIAGNOSIS — Z992 Dependence on renal dialysis: Secondary | ICD-10-CM | POA: Diagnosis not present

## 2017-08-09 DIAGNOSIS — N186 End stage renal disease: Secondary | ICD-10-CM | POA: Diagnosis not present

## 2017-08-09 DIAGNOSIS — Z992 Dependence on renal dialysis: Secondary | ICD-10-CM | POA: Diagnosis not present

## 2017-08-09 DIAGNOSIS — D509 Iron deficiency anemia, unspecified: Secondary | ICD-10-CM | POA: Diagnosis not present

## 2017-08-09 DIAGNOSIS — E119 Type 2 diabetes mellitus without complications: Secondary | ICD-10-CM | POA: Diagnosis not present

## 2017-08-09 DIAGNOSIS — N2581 Secondary hyperparathyroidism of renal origin: Secondary | ICD-10-CM | POA: Diagnosis not present

## 2017-08-12 DIAGNOSIS — N2581 Secondary hyperparathyroidism of renal origin: Secondary | ICD-10-CM | POA: Diagnosis not present

## 2017-08-12 DIAGNOSIS — N186 End stage renal disease: Secondary | ICD-10-CM | POA: Diagnosis not present

## 2017-08-12 DIAGNOSIS — D509 Iron deficiency anemia, unspecified: Secondary | ICD-10-CM | POA: Diagnosis not present

## 2017-08-12 DIAGNOSIS — Z992 Dependence on renal dialysis: Secondary | ICD-10-CM | POA: Diagnosis not present

## 2017-08-12 DIAGNOSIS — E119 Type 2 diabetes mellitus without complications: Secondary | ICD-10-CM | POA: Diagnosis not present

## 2017-08-14 DIAGNOSIS — N186 End stage renal disease: Secondary | ICD-10-CM | POA: Diagnosis not present

## 2017-08-14 DIAGNOSIS — E119 Type 2 diabetes mellitus without complications: Secondary | ICD-10-CM | POA: Diagnosis not present

## 2017-08-14 DIAGNOSIS — Z992 Dependence on renal dialysis: Secondary | ICD-10-CM | POA: Diagnosis not present

## 2017-08-14 DIAGNOSIS — D509 Iron deficiency anemia, unspecified: Secondary | ICD-10-CM | POA: Diagnosis not present

## 2017-08-14 DIAGNOSIS — N2581 Secondary hyperparathyroidism of renal origin: Secondary | ICD-10-CM | POA: Diagnosis not present

## 2017-08-16 DIAGNOSIS — N2581 Secondary hyperparathyroidism of renal origin: Secondary | ICD-10-CM | POA: Diagnosis not present

## 2017-08-16 DIAGNOSIS — E119 Type 2 diabetes mellitus without complications: Secondary | ICD-10-CM | POA: Diagnosis not present

## 2017-08-16 DIAGNOSIS — N186 End stage renal disease: Secondary | ICD-10-CM | POA: Diagnosis not present

## 2017-08-16 DIAGNOSIS — D509 Iron deficiency anemia, unspecified: Secondary | ICD-10-CM | POA: Diagnosis not present

## 2017-08-16 DIAGNOSIS — Z992 Dependence on renal dialysis: Secondary | ICD-10-CM | POA: Diagnosis not present

## 2017-08-19 DIAGNOSIS — N2581 Secondary hyperparathyroidism of renal origin: Secondary | ICD-10-CM | POA: Diagnosis not present

## 2017-08-19 DIAGNOSIS — Z992 Dependence on renal dialysis: Secondary | ICD-10-CM | POA: Diagnosis not present

## 2017-08-19 DIAGNOSIS — D509 Iron deficiency anemia, unspecified: Secondary | ICD-10-CM | POA: Diagnosis not present

## 2017-08-19 DIAGNOSIS — N186 End stage renal disease: Secondary | ICD-10-CM | POA: Diagnosis not present

## 2017-08-19 DIAGNOSIS — E119 Type 2 diabetes mellitus without complications: Secondary | ICD-10-CM | POA: Diagnosis not present

## 2017-08-21 DIAGNOSIS — E119 Type 2 diabetes mellitus without complications: Secondary | ICD-10-CM | POA: Diagnosis not present

## 2017-08-21 DIAGNOSIS — D509 Iron deficiency anemia, unspecified: Secondary | ICD-10-CM | POA: Diagnosis not present

## 2017-08-21 DIAGNOSIS — N186 End stage renal disease: Secondary | ICD-10-CM | POA: Diagnosis not present

## 2017-08-21 DIAGNOSIS — Z992 Dependence on renal dialysis: Secondary | ICD-10-CM | POA: Diagnosis not present

## 2017-08-21 DIAGNOSIS — N2581 Secondary hyperparathyroidism of renal origin: Secondary | ICD-10-CM | POA: Diagnosis not present

## 2017-08-23 DIAGNOSIS — D509 Iron deficiency anemia, unspecified: Secondary | ICD-10-CM | POA: Diagnosis not present

## 2017-08-23 DIAGNOSIS — E119 Type 2 diabetes mellitus without complications: Secondary | ICD-10-CM | POA: Diagnosis not present

## 2017-08-23 DIAGNOSIS — N186 End stage renal disease: Secondary | ICD-10-CM | POA: Diagnosis not present

## 2017-08-23 DIAGNOSIS — Z992 Dependence on renal dialysis: Secondary | ICD-10-CM | POA: Diagnosis not present

## 2017-08-23 DIAGNOSIS — N2581 Secondary hyperparathyroidism of renal origin: Secondary | ICD-10-CM | POA: Diagnosis not present

## 2017-08-26 DIAGNOSIS — D509 Iron deficiency anemia, unspecified: Secondary | ICD-10-CM | POA: Diagnosis not present

## 2017-08-26 DIAGNOSIS — N186 End stage renal disease: Secondary | ICD-10-CM | POA: Diagnosis not present

## 2017-08-26 DIAGNOSIS — N2581 Secondary hyperparathyroidism of renal origin: Secondary | ICD-10-CM | POA: Diagnosis not present

## 2017-08-26 DIAGNOSIS — E119 Type 2 diabetes mellitus without complications: Secondary | ICD-10-CM | POA: Diagnosis not present

## 2017-08-26 DIAGNOSIS — Z992 Dependence on renal dialysis: Secondary | ICD-10-CM | POA: Diagnosis not present

## 2017-08-28 DIAGNOSIS — L409 Psoriasis, unspecified: Secondary | ICD-10-CM | POA: Diagnosis not present

## 2017-08-28 DIAGNOSIS — L258 Unspecified contact dermatitis due to other agents: Secondary | ICD-10-CM | POA: Diagnosis not present

## 2017-08-28 DIAGNOSIS — N186 End stage renal disease: Secondary | ICD-10-CM | POA: Diagnosis not present

## 2017-08-28 DIAGNOSIS — Z992 Dependence on renal dialysis: Secondary | ICD-10-CM | POA: Diagnosis not present

## 2017-08-28 DIAGNOSIS — I129 Hypertensive chronic kidney disease with stage 1 through stage 4 chronic kidney disease, or unspecified chronic kidney disease: Secondary | ICD-10-CM | POA: Diagnosis not present

## 2017-08-28 DIAGNOSIS — D509 Iron deficiency anemia, unspecified: Secondary | ICD-10-CM | POA: Diagnosis not present

## 2017-08-28 DIAGNOSIS — N2581 Secondary hyperparathyroidism of renal origin: Secondary | ICD-10-CM | POA: Diagnosis not present

## 2017-08-28 DIAGNOSIS — E119 Type 2 diabetes mellitus without complications: Secondary | ICD-10-CM | POA: Diagnosis not present

## 2017-08-29 DIAGNOSIS — L258 Unspecified contact dermatitis due to other agents: Secondary | ICD-10-CM | POA: Diagnosis not present

## 2017-08-29 DIAGNOSIS — L409 Psoriasis, unspecified: Secondary | ICD-10-CM | POA: Diagnosis not present

## 2017-08-30 DIAGNOSIS — E119 Type 2 diabetes mellitus without complications: Secondary | ICD-10-CM | POA: Diagnosis not present

## 2017-08-30 DIAGNOSIS — Z992 Dependence on renal dialysis: Secondary | ICD-10-CM | POA: Diagnosis not present

## 2017-08-30 DIAGNOSIS — D509 Iron deficiency anemia, unspecified: Secondary | ICD-10-CM | POA: Diagnosis not present

## 2017-08-30 DIAGNOSIS — N2581 Secondary hyperparathyroidism of renal origin: Secondary | ICD-10-CM | POA: Diagnosis not present

## 2017-08-30 DIAGNOSIS — N186 End stage renal disease: Secondary | ICD-10-CM | POA: Diagnosis not present

## 2017-09-01 ENCOUNTER — Other Ambulatory Visit: Payer: Self-pay

## 2017-09-02 DIAGNOSIS — E119 Type 2 diabetes mellitus without complications: Secondary | ICD-10-CM | POA: Diagnosis not present

## 2017-09-02 DIAGNOSIS — D509 Iron deficiency anemia, unspecified: Secondary | ICD-10-CM | POA: Diagnosis not present

## 2017-09-02 DIAGNOSIS — N186 End stage renal disease: Secondary | ICD-10-CM | POA: Diagnosis not present

## 2017-09-02 DIAGNOSIS — Z992 Dependence on renal dialysis: Secondary | ICD-10-CM | POA: Diagnosis not present

## 2017-09-02 DIAGNOSIS — N2581 Secondary hyperparathyroidism of renal origin: Secondary | ICD-10-CM | POA: Diagnosis not present

## 2017-09-04 DIAGNOSIS — N2581 Secondary hyperparathyroidism of renal origin: Secondary | ICD-10-CM | POA: Diagnosis not present

## 2017-09-04 DIAGNOSIS — E119 Type 2 diabetes mellitus without complications: Secondary | ICD-10-CM | POA: Diagnosis not present

## 2017-09-04 DIAGNOSIS — D509 Iron deficiency anemia, unspecified: Secondary | ICD-10-CM | POA: Diagnosis not present

## 2017-09-04 DIAGNOSIS — N186 End stage renal disease: Secondary | ICD-10-CM | POA: Diagnosis not present

## 2017-09-04 DIAGNOSIS — Z992 Dependence on renal dialysis: Secondary | ICD-10-CM | POA: Diagnosis not present

## 2017-09-06 DIAGNOSIS — D509 Iron deficiency anemia, unspecified: Secondary | ICD-10-CM | POA: Diagnosis not present

## 2017-09-06 DIAGNOSIS — Z992 Dependence on renal dialysis: Secondary | ICD-10-CM | POA: Diagnosis not present

## 2017-09-06 DIAGNOSIS — N2581 Secondary hyperparathyroidism of renal origin: Secondary | ICD-10-CM | POA: Diagnosis not present

## 2017-09-06 DIAGNOSIS — E119 Type 2 diabetes mellitus without complications: Secondary | ICD-10-CM | POA: Diagnosis not present

## 2017-09-06 DIAGNOSIS — N186 End stage renal disease: Secondary | ICD-10-CM | POA: Diagnosis not present

## 2017-09-09 DIAGNOSIS — D509 Iron deficiency anemia, unspecified: Secondary | ICD-10-CM | POA: Diagnosis not present

## 2017-09-09 DIAGNOSIS — E119 Type 2 diabetes mellitus without complications: Secondary | ICD-10-CM | POA: Diagnosis not present

## 2017-09-09 DIAGNOSIS — N186 End stage renal disease: Secondary | ICD-10-CM | POA: Diagnosis not present

## 2017-09-09 DIAGNOSIS — N2581 Secondary hyperparathyroidism of renal origin: Secondary | ICD-10-CM | POA: Diagnosis not present

## 2017-09-09 DIAGNOSIS — Z992 Dependence on renal dialysis: Secondary | ICD-10-CM | POA: Diagnosis not present

## 2017-09-11 DIAGNOSIS — Z992 Dependence on renal dialysis: Secondary | ICD-10-CM | POA: Diagnosis not present

## 2017-09-11 DIAGNOSIS — N186 End stage renal disease: Secondary | ICD-10-CM | POA: Diagnosis not present

## 2017-09-11 DIAGNOSIS — D509 Iron deficiency anemia, unspecified: Secondary | ICD-10-CM | POA: Diagnosis not present

## 2017-09-11 DIAGNOSIS — N2581 Secondary hyperparathyroidism of renal origin: Secondary | ICD-10-CM | POA: Diagnosis not present

## 2017-09-11 DIAGNOSIS — E119 Type 2 diabetes mellitus without complications: Secondary | ICD-10-CM | POA: Diagnosis not present

## 2017-09-13 DIAGNOSIS — N186 End stage renal disease: Secondary | ICD-10-CM | POA: Diagnosis not present

## 2017-09-13 DIAGNOSIS — E119 Type 2 diabetes mellitus without complications: Secondary | ICD-10-CM | POA: Diagnosis not present

## 2017-09-13 DIAGNOSIS — N2581 Secondary hyperparathyroidism of renal origin: Secondary | ICD-10-CM | POA: Diagnosis not present

## 2017-09-13 DIAGNOSIS — Z992 Dependence on renal dialysis: Secondary | ICD-10-CM | POA: Diagnosis not present

## 2017-09-13 DIAGNOSIS — D509 Iron deficiency anemia, unspecified: Secondary | ICD-10-CM | POA: Diagnosis not present

## 2017-09-15 DIAGNOSIS — I871 Compression of vein: Secondary | ICD-10-CM | POA: Diagnosis not present

## 2017-09-15 DIAGNOSIS — T82858A Stenosis of vascular prosthetic devices, implants and grafts, initial encounter: Secondary | ICD-10-CM | POA: Diagnosis not present

## 2017-09-15 DIAGNOSIS — N186 End stage renal disease: Secondary | ICD-10-CM | POA: Diagnosis not present

## 2017-09-15 DIAGNOSIS — Z992 Dependence on renal dialysis: Secondary | ICD-10-CM | POA: Diagnosis not present

## 2017-09-16 DIAGNOSIS — Z992 Dependence on renal dialysis: Secondary | ICD-10-CM | POA: Diagnosis not present

## 2017-09-16 DIAGNOSIS — N186 End stage renal disease: Secondary | ICD-10-CM | POA: Diagnosis not present

## 2017-09-16 DIAGNOSIS — N2581 Secondary hyperparathyroidism of renal origin: Secondary | ICD-10-CM | POA: Diagnosis not present

## 2017-09-16 DIAGNOSIS — D509 Iron deficiency anemia, unspecified: Secondary | ICD-10-CM | POA: Diagnosis not present

## 2017-09-16 DIAGNOSIS — E119 Type 2 diabetes mellitus without complications: Secondary | ICD-10-CM | POA: Diagnosis not present

## 2017-09-18 DIAGNOSIS — N2581 Secondary hyperparathyroidism of renal origin: Secondary | ICD-10-CM | POA: Diagnosis not present

## 2017-09-18 DIAGNOSIS — E119 Type 2 diabetes mellitus without complications: Secondary | ICD-10-CM | POA: Diagnosis not present

## 2017-09-18 DIAGNOSIS — N186 End stage renal disease: Secondary | ICD-10-CM | POA: Diagnosis not present

## 2017-09-18 DIAGNOSIS — Z992 Dependence on renal dialysis: Secondary | ICD-10-CM | POA: Diagnosis not present

## 2017-09-18 DIAGNOSIS — D509 Iron deficiency anemia, unspecified: Secondary | ICD-10-CM | POA: Diagnosis not present

## 2017-09-20 DIAGNOSIS — N186 End stage renal disease: Secondary | ICD-10-CM | POA: Diagnosis not present

## 2017-09-20 DIAGNOSIS — D509 Iron deficiency anemia, unspecified: Secondary | ICD-10-CM | POA: Diagnosis not present

## 2017-09-20 DIAGNOSIS — N2581 Secondary hyperparathyroidism of renal origin: Secondary | ICD-10-CM | POA: Diagnosis not present

## 2017-09-20 DIAGNOSIS — Z992 Dependence on renal dialysis: Secondary | ICD-10-CM | POA: Diagnosis not present

## 2017-09-20 DIAGNOSIS — E119 Type 2 diabetes mellitus without complications: Secondary | ICD-10-CM | POA: Diagnosis not present

## 2017-09-23 DIAGNOSIS — E119 Type 2 diabetes mellitus without complications: Secondary | ICD-10-CM | POA: Diagnosis not present

## 2017-09-23 DIAGNOSIS — N2581 Secondary hyperparathyroidism of renal origin: Secondary | ICD-10-CM | POA: Diagnosis not present

## 2017-09-23 DIAGNOSIS — Z992 Dependence on renal dialysis: Secondary | ICD-10-CM | POA: Diagnosis not present

## 2017-09-23 DIAGNOSIS — D509 Iron deficiency anemia, unspecified: Secondary | ICD-10-CM | POA: Diagnosis not present

## 2017-09-23 DIAGNOSIS — N186 End stage renal disease: Secondary | ICD-10-CM | POA: Diagnosis not present

## 2017-09-25 DIAGNOSIS — D509 Iron deficiency anemia, unspecified: Secondary | ICD-10-CM | POA: Diagnosis not present

## 2017-09-25 DIAGNOSIS — N2581 Secondary hyperparathyroidism of renal origin: Secondary | ICD-10-CM | POA: Diagnosis not present

## 2017-09-25 DIAGNOSIS — N186 End stage renal disease: Secondary | ICD-10-CM | POA: Diagnosis not present

## 2017-09-25 DIAGNOSIS — E119 Type 2 diabetes mellitus without complications: Secondary | ICD-10-CM | POA: Diagnosis not present

## 2017-09-25 DIAGNOSIS — Z992 Dependence on renal dialysis: Secondary | ICD-10-CM | POA: Diagnosis not present

## 2017-09-27 DIAGNOSIS — N186 End stage renal disease: Secondary | ICD-10-CM | POA: Diagnosis not present

## 2017-09-27 DIAGNOSIS — D509 Iron deficiency anemia, unspecified: Secondary | ICD-10-CM | POA: Diagnosis not present

## 2017-09-27 DIAGNOSIS — E119 Type 2 diabetes mellitus without complications: Secondary | ICD-10-CM | POA: Diagnosis not present

## 2017-09-27 DIAGNOSIS — N2581 Secondary hyperparathyroidism of renal origin: Secondary | ICD-10-CM | POA: Diagnosis not present

## 2017-09-27 DIAGNOSIS — Z992 Dependence on renal dialysis: Secondary | ICD-10-CM | POA: Diagnosis not present

## 2017-09-28 DIAGNOSIS — N186 End stage renal disease: Secondary | ICD-10-CM | POA: Diagnosis not present

## 2017-09-28 DIAGNOSIS — Z992 Dependence on renal dialysis: Secondary | ICD-10-CM | POA: Diagnosis not present

## 2017-09-28 DIAGNOSIS — I129 Hypertensive chronic kidney disease with stage 1 through stage 4 chronic kidney disease, or unspecified chronic kidney disease: Secondary | ICD-10-CM | POA: Diagnosis not present

## 2017-09-30 DIAGNOSIS — D509 Iron deficiency anemia, unspecified: Secondary | ICD-10-CM | POA: Diagnosis not present

## 2017-09-30 DIAGNOSIS — Z23 Encounter for immunization: Secondary | ICD-10-CM | POA: Diagnosis not present

## 2017-09-30 DIAGNOSIS — N186 End stage renal disease: Secondary | ICD-10-CM | POA: Diagnosis not present

## 2017-09-30 DIAGNOSIS — E119 Type 2 diabetes mellitus without complications: Secondary | ICD-10-CM | POA: Diagnosis not present

## 2017-09-30 DIAGNOSIS — Z992 Dependence on renal dialysis: Secondary | ICD-10-CM | POA: Diagnosis not present

## 2017-09-30 DIAGNOSIS — N2581 Secondary hyperparathyroidism of renal origin: Secondary | ICD-10-CM | POA: Diagnosis not present

## 2017-10-02 DIAGNOSIS — N2581 Secondary hyperparathyroidism of renal origin: Secondary | ICD-10-CM | POA: Diagnosis not present

## 2017-10-02 DIAGNOSIS — E119 Type 2 diabetes mellitus without complications: Secondary | ICD-10-CM | POA: Diagnosis not present

## 2017-10-02 DIAGNOSIS — Z23 Encounter for immunization: Secondary | ICD-10-CM | POA: Diagnosis not present

## 2017-10-02 DIAGNOSIS — N186 End stage renal disease: Secondary | ICD-10-CM | POA: Diagnosis not present

## 2017-10-02 DIAGNOSIS — D509 Iron deficiency anemia, unspecified: Secondary | ICD-10-CM | POA: Diagnosis not present

## 2017-10-02 DIAGNOSIS — Z992 Dependence on renal dialysis: Secondary | ICD-10-CM | POA: Diagnosis not present

## 2017-10-04 DIAGNOSIS — E119 Type 2 diabetes mellitus without complications: Secondary | ICD-10-CM | POA: Diagnosis not present

## 2017-10-04 DIAGNOSIS — Z23 Encounter for immunization: Secondary | ICD-10-CM | POA: Diagnosis not present

## 2017-10-04 DIAGNOSIS — Z992 Dependence on renal dialysis: Secondary | ICD-10-CM | POA: Diagnosis not present

## 2017-10-04 DIAGNOSIS — N2581 Secondary hyperparathyroidism of renal origin: Secondary | ICD-10-CM | POA: Diagnosis not present

## 2017-10-04 DIAGNOSIS — D509 Iron deficiency anemia, unspecified: Secondary | ICD-10-CM | POA: Diagnosis not present

## 2017-10-04 DIAGNOSIS — N186 End stage renal disease: Secondary | ICD-10-CM | POA: Diagnosis not present

## 2017-10-07 DIAGNOSIS — D509 Iron deficiency anemia, unspecified: Secondary | ICD-10-CM | POA: Diagnosis not present

## 2017-10-07 DIAGNOSIS — N2581 Secondary hyperparathyroidism of renal origin: Secondary | ICD-10-CM | POA: Diagnosis not present

## 2017-10-07 DIAGNOSIS — E119 Type 2 diabetes mellitus without complications: Secondary | ICD-10-CM | POA: Diagnosis not present

## 2017-10-07 DIAGNOSIS — N186 End stage renal disease: Secondary | ICD-10-CM | POA: Diagnosis not present

## 2017-10-07 DIAGNOSIS — Z23 Encounter for immunization: Secondary | ICD-10-CM | POA: Diagnosis not present

## 2017-10-07 DIAGNOSIS — Z992 Dependence on renal dialysis: Secondary | ICD-10-CM | POA: Diagnosis not present

## 2017-10-09 DIAGNOSIS — N2581 Secondary hyperparathyroidism of renal origin: Secondary | ICD-10-CM | POA: Diagnosis not present

## 2017-10-09 DIAGNOSIS — Z23 Encounter for immunization: Secondary | ICD-10-CM | POA: Diagnosis not present

## 2017-10-09 DIAGNOSIS — D509 Iron deficiency anemia, unspecified: Secondary | ICD-10-CM | POA: Diagnosis not present

## 2017-10-09 DIAGNOSIS — E119 Type 2 diabetes mellitus without complications: Secondary | ICD-10-CM | POA: Diagnosis not present

## 2017-10-09 DIAGNOSIS — Z992 Dependence on renal dialysis: Secondary | ICD-10-CM | POA: Diagnosis not present

## 2017-10-09 DIAGNOSIS — N186 End stage renal disease: Secondary | ICD-10-CM | POA: Diagnosis not present

## 2017-10-11 DIAGNOSIS — Z23 Encounter for immunization: Secondary | ICD-10-CM | POA: Diagnosis not present

## 2017-10-11 DIAGNOSIS — D509 Iron deficiency anemia, unspecified: Secondary | ICD-10-CM | POA: Diagnosis not present

## 2017-10-11 DIAGNOSIS — N186 End stage renal disease: Secondary | ICD-10-CM | POA: Diagnosis not present

## 2017-10-11 DIAGNOSIS — N2581 Secondary hyperparathyroidism of renal origin: Secondary | ICD-10-CM | POA: Diagnosis not present

## 2017-10-11 DIAGNOSIS — Z992 Dependence on renal dialysis: Secondary | ICD-10-CM | POA: Diagnosis not present

## 2017-10-11 DIAGNOSIS — E119 Type 2 diabetes mellitus without complications: Secondary | ICD-10-CM | POA: Diagnosis not present

## 2017-10-14 DIAGNOSIS — D509 Iron deficiency anemia, unspecified: Secondary | ICD-10-CM | POA: Diagnosis not present

## 2017-10-14 DIAGNOSIS — N186 End stage renal disease: Secondary | ICD-10-CM | POA: Diagnosis not present

## 2017-10-14 DIAGNOSIS — Z992 Dependence on renal dialysis: Secondary | ICD-10-CM | POA: Diagnosis not present

## 2017-10-14 DIAGNOSIS — E119 Type 2 diabetes mellitus without complications: Secondary | ICD-10-CM | POA: Diagnosis not present

## 2017-10-14 DIAGNOSIS — Z23 Encounter for immunization: Secondary | ICD-10-CM | POA: Diagnosis not present

## 2017-10-14 DIAGNOSIS — N2581 Secondary hyperparathyroidism of renal origin: Secondary | ICD-10-CM | POA: Diagnosis not present

## 2017-10-16 DIAGNOSIS — E119 Type 2 diabetes mellitus without complications: Secondary | ICD-10-CM | POA: Diagnosis not present

## 2017-10-16 DIAGNOSIS — Z23 Encounter for immunization: Secondary | ICD-10-CM | POA: Diagnosis not present

## 2017-10-16 DIAGNOSIS — N186 End stage renal disease: Secondary | ICD-10-CM | POA: Diagnosis not present

## 2017-10-16 DIAGNOSIS — D509 Iron deficiency anemia, unspecified: Secondary | ICD-10-CM | POA: Diagnosis not present

## 2017-10-16 DIAGNOSIS — Z992 Dependence on renal dialysis: Secondary | ICD-10-CM | POA: Diagnosis not present

## 2017-10-16 DIAGNOSIS — N2581 Secondary hyperparathyroidism of renal origin: Secondary | ICD-10-CM | POA: Diagnosis not present

## 2017-10-17 DIAGNOSIS — Z125 Encounter for screening for malignant neoplasm of prostate: Secondary | ICD-10-CM | POA: Diagnosis not present

## 2017-10-17 DIAGNOSIS — E782 Mixed hyperlipidemia: Secondary | ICD-10-CM | POA: Diagnosis not present

## 2017-10-17 DIAGNOSIS — I120 Hypertensive chronic kidney disease with stage 5 chronic kidney disease or end stage renal disease: Secondary | ICD-10-CM | POA: Diagnosis not present

## 2017-10-17 DIAGNOSIS — R7309 Other abnormal glucose: Secondary | ICD-10-CM | POA: Diagnosis not present

## 2017-10-17 DIAGNOSIS — Z0000 Encounter for general adult medical examination without abnormal findings: Secondary | ICD-10-CM | POA: Diagnosis not present

## 2017-10-17 DIAGNOSIS — Z992 Dependence on renal dialysis: Secondary | ICD-10-CM | POA: Diagnosis not present

## 2017-10-17 DIAGNOSIS — N186 End stage renal disease: Secondary | ICD-10-CM | POA: Diagnosis not present

## 2017-10-17 DIAGNOSIS — N08 Glomerular disorders in diseases classified elsewhere: Secondary | ICD-10-CM | POA: Diagnosis not present

## 2017-10-17 DIAGNOSIS — C8590 Non-Hodgkin lymphoma, unspecified, unspecified site: Secondary | ICD-10-CM | POA: Diagnosis not present

## 2017-10-17 DIAGNOSIS — E039 Hypothyroidism, unspecified: Secondary | ICD-10-CM | POA: Diagnosis not present

## 2017-10-18 DIAGNOSIS — D509 Iron deficiency anemia, unspecified: Secondary | ICD-10-CM | POA: Diagnosis not present

## 2017-10-18 DIAGNOSIS — Z23 Encounter for immunization: Secondary | ICD-10-CM | POA: Diagnosis not present

## 2017-10-18 DIAGNOSIS — N186 End stage renal disease: Secondary | ICD-10-CM | POA: Diagnosis not present

## 2017-10-18 DIAGNOSIS — Z992 Dependence on renal dialysis: Secondary | ICD-10-CM | POA: Diagnosis not present

## 2017-10-18 DIAGNOSIS — E119 Type 2 diabetes mellitus without complications: Secondary | ICD-10-CM | POA: Diagnosis not present

## 2017-10-18 DIAGNOSIS — N2581 Secondary hyperparathyroidism of renal origin: Secondary | ICD-10-CM | POA: Diagnosis not present

## 2017-10-20 DIAGNOSIS — E1351 Other specified diabetes mellitus with diabetic peripheral angiopathy without gangrene: Secondary | ICD-10-CM | POA: Diagnosis not present

## 2017-10-20 DIAGNOSIS — L602 Onychogryphosis: Secondary | ICD-10-CM | POA: Diagnosis not present

## 2017-10-21 DIAGNOSIS — Z992 Dependence on renal dialysis: Secondary | ICD-10-CM | POA: Diagnosis not present

## 2017-10-21 DIAGNOSIS — N186 End stage renal disease: Secondary | ICD-10-CM | POA: Diagnosis not present

## 2017-10-21 DIAGNOSIS — Z23 Encounter for immunization: Secondary | ICD-10-CM | POA: Diagnosis not present

## 2017-10-21 DIAGNOSIS — D509 Iron deficiency anemia, unspecified: Secondary | ICD-10-CM | POA: Diagnosis not present

## 2017-10-21 DIAGNOSIS — E119 Type 2 diabetes mellitus without complications: Secondary | ICD-10-CM | POA: Diagnosis not present

## 2017-10-21 DIAGNOSIS — N2581 Secondary hyperparathyroidism of renal origin: Secondary | ICD-10-CM | POA: Diagnosis not present

## 2017-10-23 DIAGNOSIS — E119 Type 2 diabetes mellitus without complications: Secondary | ICD-10-CM | POA: Diagnosis not present

## 2017-10-23 DIAGNOSIS — N186 End stage renal disease: Secondary | ICD-10-CM | POA: Diagnosis not present

## 2017-10-23 DIAGNOSIS — Z992 Dependence on renal dialysis: Secondary | ICD-10-CM | POA: Diagnosis not present

## 2017-10-23 DIAGNOSIS — D509 Iron deficiency anemia, unspecified: Secondary | ICD-10-CM | POA: Diagnosis not present

## 2017-10-23 DIAGNOSIS — N2581 Secondary hyperparathyroidism of renal origin: Secondary | ICD-10-CM | POA: Diagnosis not present

## 2017-10-23 DIAGNOSIS — Z23 Encounter for immunization: Secondary | ICD-10-CM | POA: Diagnosis not present

## 2017-10-25 DIAGNOSIS — Z23 Encounter for immunization: Secondary | ICD-10-CM | POA: Diagnosis not present

## 2017-10-25 DIAGNOSIS — D509 Iron deficiency anemia, unspecified: Secondary | ICD-10-CM | POA: Diagnosis not present

## 2017-10-25 DIAGNOSIS — N2581 Secondary hyperparathyroidism of renal origin: Secondary | ICD-10-CM | POA: Diagnosis not present

## 2017-10-25 DIAGNOSIS — Z992 Dependence on renal dialysis: Secondary | ICD-10-CM | POA: Diagnosis not present

## 2017-10-25 DIAGNOSIS — E119 Type 2 diabetes mellitus without complications: Secondary | ICD-10-CM | POA: Diagnosis not present

## 2017-10-25 DIAGNOSIS — N186 End stage renal disease: Secondary | ICD-10-CM | POA: Diagnosis not present

## 2017-10-27 ENCOUNTER — Inpatient Hospital Stay: Payer: Medicare Other | Attending: Hematology and Oncology | Admitting: Hematology and Oncology

## 2017-10-27 ENCOUNTER — Telehealth: Payer: Self-pay | Admitting: Hematology and Oncology

## 2017-10-27 DIAGNOSIS — Z9221 Personal history of antineoplastic chemotherapy: Secondary | ICD-10-CM

## 2017-10-27 DIAGNOSIS — C8338 Diffuse large B-cell lymphoma, lymph nodes of multiple sites: Secondary | ICD-10-CM

## 2017-10-27 DIAGNOSIS — Z7982 Long term (current) use of aspirin: Secondary | ICD-10-CM | POA: Diagnosis not present

## 2017-10-27 DIAGNOSIS — Z79899 Other long term (current) drug therapy: Secondary | ICD-10-CM | POA: Diagnosis not present

## 2017-10-27 NOTE — Telephone Encounter (Signed)
Gave avs and calendar ° °

## 2017-10-28 ENCOUNTER — Encounter: Payer: Self-pay | Admitting: Hematology and Oncology

## 2017-10-28 DIAGNOSIS — I129 Hypertensive chronic kidney disease with stage 1 through stage 4 chronic kidney disease, or unspecified chronic kidney disease: Secondary | ICD-10-CM | POA: Diagnosis not present

## 2017-10-28 DIAGNOSIS — N2581 Secondary hyperparathyroidism of renal origin: Secondary | ICD-10-CM | POA: Diagnosis not present

## 2017-10-28 DIAGNOSIS — N186 End stage renal disease: Secondary | ICD-10-CM | POA: Diagnosis not present

## 2017-10-28 DIAGNOSIS — Z992 Dependence on renal dialysis: Secondary | ICD-10-CM | POA: Diagnosis not present

## 2017-10-28 DIAGNOSIS — E119 Type 2 diabetes mellitus without complications: Secondary | ICD-10-CM | POA: Diagnosis not present

## 2017-10-28 DIAGNOSIS — D509 Iron deficiency anemia, unspecified: Secondary | ICD-10-CM | POA: Diagnosis not present

## 2017-10-28 NOTE — Progress Notes (Signed)
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   INTERVAL HISTORY: Please see below for problem oriented charting. 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 ([place])   06/07/2015 Initial Diagnosis    Non-Hodgkin lymphoma of intra-abdominal lymph nodes ([place])    06/12/2015 Imaging    CT chest showed lymphadenopathy within the low neck and low chest, consistent with active lymphoma    06/14/2015 Procedure    He underwent CT-guided biopsy    06/14/2015 Pathology Results    Biopsy of the retroperitoneal mass confirmed diffuse large B-cell lymphoma    06/21/2015 Imaging    ECHO showed normal EF    06/21/2015 Procedure    He has port placement    06/23/2015 - 10/12/2015 Chemotherapy    He received mini-RCHOP chemo x 6 cycles    08/25/2015 PET scan    Significant interval decrease in size of the bulky retroperitoneal lymphadenopathy suggesting an excellent response to therapy. Residual matted soft tissue density in the retroperitoneum with SUV max of approximately 3.3    11/27/2015 PET scan    Continued improved appearance of the treated lymphoma. Minimal residual matted soft tissue density but no discrete measurable  disease and no hypermetabolism to suggest metabolically active tumor.    05/24/2016 Imaging    Unchanged retroperitoneal soft tissue most compatible with history of treated lymphoma. No new or enlarging adenopathy in the chest, abdomen or pelvis. Polycystic renal disease. Bilateral fat containing inguinal hernias. Aortic atherosclerosis.    01/15/2017 Imaging    1. Stable findings of treated tumor in the retroperitoneum and right pelvis. No evidence of recurrent lymphoma in the abdomen or pelvis. 2. New 2.1 cm nodular opacity in the dependent right lower lobe, nonspecific, more likely inflammatory. No thoracic adenopathy. Recommend attention on follow-up chest CT in 3 months. 3. Polycystic kidneys. Increased thin mural calcification associated with the dominant exophytic 6.2 cm medial lower left renal cyst, which is stable in size and technically indeterminate for neoplasm. Presumably, an IV contrast-enhanced study is precluded by poor renal function. Given the size stability, continued surveillance at CT abdomen is a reasonable option. 4. Chronic findings include: Aortic Atherosclerosis (ICD10-I70.0). Small hiatal hernia. Mild terminal ileum and large bowel diverticulosis. Mild prostatomegaly.    04/17/2017 Imaging    CT chest 1. Stable pleural or subpleural cyst in the right lower lobe. No worrisome pulmonary lesions or acute pulmonary findings. 2. No mediastinal or hilar mass or adenopathy. No supraclavicular or axillary adenopathy. CT abdomen 3. No abdominal/pelvic/inguinal lymphadenopathy. 4. Polycystic kidney disease. 5. No acute abdominal/pelvic findings.    05/12/2017 Procedure    Technically successful tunneled Port catheter removal.     REVIEW OF SYSTEMS:   Constitutional: Denies fevers, chills or abnormal weight loss  Eyes: Denies blurriness of vision Ears, nose, mouth, throat, and face: Denies mucositis or sore throat Respiratory: Denies cough, dyspnea or  wheezes Cardiovascular: Denies palpitation, chest discomfort or lower extremity swelling Gastrointestinal:  Denies nausea, heartburn or change in bowel habits Skin: Denies abnormal skin rashes Lymphatics: Denies new lymphadenopathy or easy bruising Neurological:Denies numbness, tingling or new weaknesses Behavioral/Psych: Mood is stable, no new changes  All other systems were reviewed with the patient and are negative.  I have reviewed the past medical history, past surgical history, social history and family history with the patient and they are unchanged from previous note.  ALLERGIES:  is allergic to tape.  MEDICATIONS:  Current Outpatient Medications  Medication Sig Dispense Refill  . aspirin 325 MG tablet Take 1 tablet (325 mg total) by mouth daily. 30 tablet 0  . atorvastatin (LIPITOR) 10 MG tablet Take 10 mg by mouth daily.  0  . calcium acetate (PHOSLO) 667 MG capsule Take 1 capsule by mouth 2 (two) times daily.   11  . clobetasol ointment (TEMOVATE) 5.46 % Apply 1 application topically 2 (two) times daily.    . cloNIDine (CATAPRES) 0.2 MG tablet Take 0.4 mg by mouth 2 (two) times daily.     . hydrOXYzine (ATARAX/VISTARIL) 10 MG tablet Take 10 mg by mouth daily.    Marland Kitchen levETIRAcetam (KEPPRA) 500 MG tablet Take 1 tablet (500 mg total) by mouth 2 (two) times daily. 60 tablet 6  . levothyroxine (SYNTHROID, LEVOTHROID) 25 MCG tablet Take 25 mcg by mouth daily.   0  . metoprolol (LOPRESSOR) 100 MG tablet Take 150 mg by mouth 2 (two) times daily.     . multivitamin (RENA-VIT) TABS tablet Take 1 tablet by mouth at bedtime. 90 tablet 0   No current facility-administered medications for this visit.     PHYSICAL EXAMINATION: ECOG PERFORMANCE STATUS: 1 - Symptomatic but completely ambulatory  Vitals:   10/27/17 0943  BP: 116/72  Pulse: 68  Resp: 18  Temp: 98.2 F (36.8 C)  SpO2: 98%   Filed Weights   10/27/17 0943  Weight: 199 lb (90.3 kg)    GENERAL:alert, no distress and  comfortable SKIN: skin color, texture, turgor are normal, no rashes or significant lesions EYES: normal, Conjunctiva are pink and non-injected, sclera clear OROPHARYNX:no exudate, no erythema and lips, buccal mucosa, and tongue normal  NECK: supple, thyroid normal size, non-tender, without nodularity LYMPH:  no palpable lymphadenopathy in the cervical, axillary or inguinal LUNGS: clear to auscultation and percussion with normal breathing effort HEART: regular rate & rhythm and no murmurs and no lower extremity edema ABDOMEN:abdomen soft, non-tender and normal bowel sounds Musculoskeletal:no cyanosis of digits and no clubbing  NEURO: alert & oriented x 3 with fluent speech, with persistent neurological deficit consistent with prior stroke  LABORATORY DATA:  I have reviewed the data as listed    Component Value Date/Time   NA 138 07/08/2016 1052   K 4.3 07/08/2016 1052   CL 95 (L) 06/21/2015 1345   CO2 29 07/08/2016 1052   GLUCOSE 87 07/08/2016 1052   BUN 42.7 (H) 07/08/2016 1052   CREATININE 8.7 (HH) 07/08/2016 1052   CALCIUM 9.6 07/08/2016 1052   PROT 7.4 12/30/2016 1005   PROT 6.7 07/08/2016 1052   ALBUMIN 4.4 12/30/2016 1005   ALBUMIN 3.1 (L) 07/08/2016 1052   AST 24 12/30/2016 1005   AST 15 07/08/2016 1052   ALT 22 12/30/2016 1005   ALT 14 07/08/2016 1052   ALKPHOS 72  12/30/2016 1005   ALKPHOS 62 07/08/2016 1052   BILITOT 0.3 12/30/2016 1005   BILITOT 0.41 07/08/2016 1052   GFRNONAA 8 (L) 06/21/2015 1345   GFRAA 9 (L) 06/21/2015 1345    No results found for: SPEP, UPEP  Lab Results  Component Value Date   WBC 7.2 03/31/2017   NEUTROABS 3.0 03/31/2017   HGB 12.9 (L) 03/31/2017   HCT 39.4 03/31/2017   MCV 103.7 (H) 03/31/2017   PLT 171 03/31/2017      Chemistry      Component Value Date/Time   NA 138 07/08/2016 1052   K 4.3 07/08/2016 1052   CL 95 (L) 06/21/2015 1345   CO2 29 07/08/2016 1052   BUN 42.7 (H) 07/08/2016 1052   CREATININE 8.7 (HH) 07/08/2016  1052      Component Value Date/Time   CALCIUM 9.6 07/08/2016 1052   ALKPHOS 72 12/30/2016 1005   ALKPHOS 62 07/08/2016 1052   AST 24 12/30/2016 1005   AST 15 07/08/2016 1052   ALT 22 12/30/2016 1005   ALT 14 07/08/2016 1052   BILITOT 0.3 12/30/2016 1005   BILITOT 0.41 07/08/2016 1052       All questions were answered. The patient knows to call the clinic with any problems, questions or concerns. No barriers to learning was detected.  I spent 10 minutes counseling the patient face to face. The total time spent in the appointment was 15 minutes and more than 50% was on counseling and review of test results  Heath Lark, MD 10/28/2017 1:55 PM

## 2017-10-28 NOTE — Assessment & Plan Note (Signed)
Last CT scan showed no evidence of cancer recurrence I plan to see him back in 6 months with history and physical examination The patient is educated to watch for signs and symptoms of cancer recurrence

## 2017-10-30 DIAGNOSIS — N2581 Secondary hyperparathyroidism of renal origin: Secondary | ICD-10-CM | POA: Diagnosis not present

## 2017-10-30 DIAGNOSIS — Z992 Dependence on renal dialysis: Secondary | ICD-10-CM | POA: Diagnosis not present

## 2017-10-30 DIAGNOSIS — D509 Iron deficiency anemia, unspecified: Secondary | ICD-10-CM | POA: Diagnosis not present

## 2017-10-30 DIAGNOSIS — N186 End stage renal disease: Secondary | ICD-10-CM | POA: Diagnosis not present

## 2017-10-30 DIAGNOSIS — E119 Type 2 diabetes mellitus without complications: Secondary | ICD-10-CM | POA: Diagnosis not present

## 2017-11-01 DIAGNOSIS — N186 End stage renal disease: Secondary | ICD-10-CM | POA: Diagnosis not present

## 2017-11-01 DIAGNOSIS — D509 Iron deficiency anemia, unspecified: Secondary | ICD-10-CM | POA: Diagnosis not present

## 2017-11-01 DIAGNOSIS — Z992 Dependence on renal dialysis: Secondary | ICD-10-CM | POA: Diagnosis not present

## 2017-11-01 DIAGNOSIS — N2581 Secondary hyperparathyroidism of renal origin: Secondary | ICD-10-CM | POA: Diagnosis not present

## 2017-11-01 DIAGNOSIS — E119 Type 2 diabetes mellitus without complications: Secondary | ICD-10-CM | POA: Diagnosis not present

## 2017-11-04 DIAGNOSIS — E119 Type 2 diabetes mellitus without complications: Secondary | ICD-10-CM | POA: Diagnosis not present

## 2017-11-04 DIAGNOSIS — N2581 Secondary hyperparathyroidism of renal origin: Secondary | ICD-10-CM | POA: Diagnosis not present

## 2017-11-04 DIAGNOSIS — D509 Iron deficiency anemia, unspecified: Secondary | ICD-10-CM | POA: Diagnosis not present

## 2017-11-04 DIAGNOSIS — Z992 Dependence on renal dialysis: Secondary | ICD-10-CM | POA: Diagnosis not present

## 2017-11-04 DIAGNOSIS — N186 End stage renal disease: Secondary | ICD-10-CM | POA: Diagnosis not present

## 2017-11-06 DIAGNOSIS — N186 End stage renal disease: Secondary | ICD-10-CM | POA: Diagnosis not present

## 2017-11-06 DIAGNOSIS — N2581 Secondary hyperparathyroidism of renal origin: Secondary | ICD-10-CM | POA: Diagnosis not present

## 2017-11-06 DIAGNOSIS — D509 Iron deficiency anemia, unspecified: Secondary | ICD-10-CM | POA: Diagnosis not present

## 2017-11-06 DIAGNOSIS — E119 Type 2 diabetes mellitus without complications: Secondary | ICD-10-CM | POA: Diagnosis not present

## 2017-11-06 DIAGNOSIS — Z992 Dependence on renal dialysis: Secondary | ICD-10-CM | POA: Diagnosis not present

## 2017-11-08 DIAGNOSIS — Z992 Dependence on renal dialysis: Secondary | ICD-10-CM | POA: Diagnosis not present

## 2017-11-08 DIAGNOSIS — N2581 Secondary hyperparathyroidism of renal origin: Secondary | ICD-10-CM | POA: Diagnosis not present

## 2017-11-08 DIAGNOSIS — D509 Iron deficiency anemia, unspecified: Secondary | ICD-10-CM | POA: Diagnosis not present

## 2017-11-08 DIAGNOSIS — E119 Type 2 diabetes mellitus without complications: Secondary | ICD-10-CM | POA: Diagnosis not present

## 2017-11-08 DIAGNOSIS — N186 End stage renal disease: Secondary | ICD-10-CM | POA: Diagnosis not present

## 2017-11-11 DIAGNOSIS — N2581 Secondary hyperparathyroidism of renal origin: Secondary | ICD-10-CM | POA: Diagnosis not present

## 2017-11-11 DIAGNOSIS — E119 Type 2 diabetes mellitus without complications: Secondary | ICD-10-CM | POA: Diagnosis not present

## 2017-11-11 DIAGNOSIS — N186 End stage renal disease: Secondary | ICD-10-CM | POA: Diagnosis not present

## 2017-11-11 DIAGNOSIS — D509 Iron deficiency anemia, unspecified: Secondary | ICD-10-CM | POA: Diagnosis not present

## 2017-11-11 DIAGNOSIS — Z992 Dependence on renal dialysis: Secondary | ICD-10-CM | POA: Diagnosis not present

## 2017-11-13 ENCOUNTER — Encounter (HOSPITAL_COMMUNITY): Payer: Self-pay | Admitting: Emergency Medicine

## 2017-11-13 ENCOUNTER — Other Ambulatory Visit: Payer: Self-pay

## 2017-11-13 DIAGNOSIS — Z992 Dependence on renal dialysis: Secondary | ICD-10-CM | POA: Diagnosis not present

## 2017-11-13 DIAGNOSIS — E039 Hypothyroidism, unspecified: Secondary | ICD-10-CM | POA: Insufficient documentation

## 2017-11-13 DIAGNOSIS — I120 Hypertensive chronic kidney disease with stage 5 chronic kidney disease or end stage renal disease: Secondary | ICD-10-CM | POA: Diagnosis not present

## 2017-11-13 DIAGNOSIS — K922 Gastrointestinal hemorrhage, unspecified: Secondary | ICD-10-CM | POA: Diagnosis not present

## 2017-11-13 DIAGNOSIS — R109 Unspecified abdominal pain: Secondary | ICD-10-CM | POA: Diagnosis not present

## 2017-11-13 DIAGNOSIS — N2581 Secondary hyperparathyroidism of renal origin: Secondary | ICD-10-CM | POA: Diagnosis not present

## 2017-11-13 DIAGNOSIS — E119 Type 2 diabetes mellitus without complications: Secondary | ICD-10-CM | POA: Diagnosis not present

## 2017-11-13 DIAGNOSIS — E1122 Type 2 diabetes mellitus with diabetic chronic kidney disease: Secondary | ICD-10-CM | POA: Insufficient documentation

## 2017-11-13 DIAGNOSIS — C8593 Non-Hodgkin lymphoma, unspecified, intra-abdominal lymph nodes: Secondary | ICD-10-CM | POA: Diagnosis not present

## 2017-11-13 DIAGNOSIS — N186 End stage renal disease: Secondary | ICD-10-CM | POA: Diagnosis not present

## 2017-11-13 DIAGNOSIS — D509 Iron deficiency anemia, unspecified: Secondary | ICD-10-CM | POA: Diagnosis not present

## 2017-11-13 DIAGNOSIS — Z79899 Other long term (current) drug therapy: Secondary | ICD-10-CM | POA: Diagnosis not present

## 2017-11-13 DIAGNOSIS — Z87891 Personal history of nicotine dependence: Secondary | ICD-10-CM | POA: Diagnosis not present

## 2017-11-13 DIAGNOSIS — K625 Hemorrhage of anus and rectum: Secondary | ICD-10-CM | POA: Diagnosis present

## 2017-11-13 NOTE — ED Triage Notes (Signed)
Pt from home with c/o recurrent rectal bleed. Pt stated this began about 2 years ago. Pt states current symptoms began today. Pt reports symptoms only present with bowel movement. Pt denies pain in rectum or abdomen. Pt denies light headedness. Pt not tachycardic nor hypotensive

## 2017-11-14 ENCOUNTER — Emergency Department (HOSPITAL_COMMUNITY)
Admission: EM | Admit: 2017-11-14 | Discharge: 2017-11-14 | Disposition: A | Discharge status: Home / Self Care | Payer: Medicare Other | Attending: Emergency Medicine | Admitting: Emergency Medicine

## 2017-11-14 DIAGNOSIS — K922 Gastrointestinal hemorrhage, unspecified: Secondary | ICD-10-CM | POA: Diagnosis not present

## 2017-11-14 LAB — CBC WITH DIFFERENTIAL/PLATELET
Abs Immature Granulocytes: 0.03 10*3/uL (ref 0.00–0.07)
BASOS ABS: 0.1 10*3/uL (ref 0.0–0.1)
BASOS PCT: 1 %
EOS PCT: 10 %
Eosinophils Absolute: 0.8 10*3/uL — ABNORMAL HIGH (ref 0.0–0.5)
HCT: 45.2 % (ref 39.0–52.0)
HEMOGLOBIN: 14.1 g/dL (ref 13.0–17.0)
Immature Granulocytes: 0 %
LYMPHS PCT: 23 %
Lymphs Abs: 1.9 10*3/uL (ref 0.7–4.0)
MCH: 33.4 pg (ref 26.0–34.0)
MCHC: 31.2 g/dL (ref 30.0–36.0)
MCV: 107.1 fL — ABNORMAL HIGH (ref 80.0–100.0)
Monocytes Absolute: 0.9 10*3/uL (ref 0.1–1.0)
Monocytes Relative: 10 %
NEUTROS ABS: 4.8 10*3/uL (ref 1.7–7.7)
Neutrophils Relative %: 56 %
PLATELETS: 193 10*3/uL (ref 150–400)
RBC: 4.22 MIL/uL (ref 4.22–5.81)
RDW: 14.5 % (ref 11.5–15.5)
WBC: 8.5 10*3/uL (ref 4.0–10.5)
nRBC: 0 % (ref 0.0–0.2)

## 2017-11-14 LAB — COMPREHENSIVE METABOLIC PANEL
ALBUMIN: 4.3 g/dL (ref 3.5–5.0)
ALK PHOS: 60 U/L (ref 38–126)
ALT: 29 U/L (ref 0–44)
ANION GAP: 14 (ref 5–15)
AST: 33 U/L (ref 15–41)
BUN: 40 mg/dL — ABNORMAL HIGH (ref 8–23)
CALCIUM: 9.4 mg/dL (ref 8.9–10.3)
CHLORIDE: 96 mmol/L — AB (ref 98–111)
CO2: 29 mmol/L (ref 22–32)
Creatinine, Ser: 7.7 mg/dL — ABNORMAL HIGH (ref 0.61–1.24)
GFR calc non Af Amer: 6 mL/min — ABNORMAL LOW (ref >60)
GFR, EST AFRICAN AMERICAN: 7 mL/min — AB (ref >60)
GLUCOSE: 116 mg/dL — AB (ref 70–99)
POTASSIUM: 5.4 mmol/L — AB (ref 3.5–5.1)
Sodium: 139 mmol/L (ref 135–145)
Total Bilirubin: 0.2 mg/dL — ABNORMAL LOW (ref 0.3–1.2)
Total Protein: 8.4 g/dL — ABNORMAL HIGH (ref 6.5–8.1)

## 2017-11-14 LAB — TYPE AND SCREEN
ABO/RH(D): O POS
ANTIBODY SCREEN: NEGATIVE

## 2017-11-14 LAB — ABO/RH: ABO/RH(D): O POS

## 2017-11-14 LAB — PROTIME-INR
INR: 0.88
Prothrombin Time: 11.9 seconds (ref 11.4–15.2)

## 2017-11-14 LAB — POC OCCULT BLOOD, ED: FECAL OCCULT BLD: POSITIVE — AB

## 2017-11-14 NOTE — ED Provider Notes (Signed)
[facility] DEPT Provider Note   CSN: [id number] Arrival date & time: 11/13/17  2117     History   Chief Complaint Chief Complaint  Patient presents with  . Rectal Bleeding    HPI Javier Jenkins is a 67 y.o. male.  HPI 67 year old male with history of end-stage renal disease on HD Tuesday, Thursday, Saturday, diabetes, non-Hodgkin's lymphoma comes in with chief complaint of rectal bleeding.  Patient reports that he has had off and on bloody stools over the past several months.  However, yesterday patient had blood per rectum around dinnertime.  Since then he has had some bloody drainage from his rectum.  Patient denies any associated abdominal pain, nausea, vomiting.  Patient does not take any blood thinners.  Patient has no history of colonoscopy or GI bleed.  Past Medical History:  Diagnosis Date  . Chronic kidney disease    stage 5  . Constipation   . Diabetes mellitus without complication ([place])    not on medications  . 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 Hypertension   . Hypothyroidism   . Non-Hodgkin lymphoma of intra-abdominal lymph nodes ([place]) 06/07/2015  . 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 Stroke [facility])    right side paralysis    Patient Active Problem List   Diagnosis Date Noted  . Nodule of lower lobe of right lung 03/31/2017  . Kidney cysts 03/31/2017  . Contact dermatitis, allergic 02/26/2016  . Dermatitis due to unknown cause 09/18/2015  . Port catheter in place 08/25/2015  . Mucositis due to chemotherapy 07/14/2015  . 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 Constipation due to opioid therapy 06/07/2015  . Chronic nausea 06/07/2015  . Abdominal pain 06/07/2015  . Hemiparesis affecting right side as late effect of stroke ([place]) 06/07/2015  . Adult polycystic kidney disease 06/07/2015  . Abdominal bloating   . ESRD needing dialysis  ([place]) 05/31/2015  . ESRD (end stage renal disease) ([place]) 05/31/2015  . Seizure prophylaxis 05/31/2015  . Hypothyroidism 05/31/2015  . Syncope 04/13/2015  . End stage renal failure on dialysis ([place]) 04/13/2015  . Anemia in chronic kidney disease 04/13/2015  . Essential hypertension 04/13/2015  . Faintness   . Subdural hematoma ([place]) 03/17/2015    Past Surgical History:  Procedure Laterality Date  . AV FISTULA PLACEMENT Left 10/18/2014   Procedure: ARTERIOVENOUS (AV) FISTULA CREATION;  Surgeon: Angelia Mould, MD;  Location: [facility];  Service: Vascular;  Laterality: Left;  . BURR HOLE Right 03/17/2015   Procedure: BURR HOLES FOR RIGHT SUBDURAL HEMATOMA;  Surgeon: Leeroy Cha, MD;  Location: [facility];  Service: Neurosurgery;  Laterality: Right;  . HEMORRHOID SURGERY    . IR REMOVAL TUN ACCESS W/ PORT W/O FL MOD SED  05/12/2017        Home Medications    Prior to Admission medications   Medication Sig Start Date End Date Taking? Authorizing Provider  aspirin 81 MG chewable tablet Chew 81 mg by mouth daily.   Yes [provider]  calcium acetate (PHOSLO) 667 MG capsule Take 1 capsule by mouth daily.  04/06/15  Yes [provider]  cinacalcet (SENSIPAR) 30 MG tablet Take 30 mg by mouth daily.   Yes [provider]  cloNIDine (CATAPRES) 0.2 MG tablet Take 0.2 mg by mouth daily.    Yes [provider]  ferric citrate (AURYXIA) 1 GM 210 MG(Fe) tablet Take  630 mg by mouth 3 (three) times daily with meals.   Yes [provider]  levETIRAcetam (KEPPRA) 500 MG tablet Take 1 tablet (500 mg total) by mouth 2 (two) times daily. 06/16/15  Yes Gorsuch, Ni, MD  levothyroxine (SYNTHROID, LEVOTHROID) 25 MCG tablet Take 25 mcg by mouth daily.  01/29/14  Yes [provider]  metoprolol (LOPRESSOR) 100 MG tablet Take 100 mg by mouth 2 (two) times daily.    Yes [provider]  aspirin 325 MG tablet Take 1 tablet (325 mg total) by mouth  daily. Patient not taking: Reported on 11/14/2017 04/21/15   Reyne Dumas, MD  multivitamin (RENA-VIT) TABS tablet Take 1 tablet by mouth at bedtime. Patient not taking: Reported on 11/14/2017 06/03/15   Tomma Rakers, MD    Family History Family History  Problem Relation Age of Onset  . Hypertension Mother   . Cancer Paternal Uncle        prostate ca    Social History Social History   Tobacco Use  . Smoking status: Former Smoker    Types: Pipe    Last attempt to quit: 09/22/1984    Years since quitting: 33.1  . Smokeless tobacco: Never Used  Substance Use Topics  . Alcohol use: No    Alcohol/week: 0.0 standard drinks  . Drug use: No     Allergies   Tape   Review of Systems Review of Systems  Constitutional: Positive for activity change.  Respiratory: Negative for shortness of breath.   Cardiovascular: Negative for chest pain.  Gastrointestinal: Positive for abdominal pain and blood in stool. Negative for nausea and vomiting.  Hematological: Does not bruise/bleed easily.  All other systems reviewed and are negative.    Physical Exam Updated Vital Signs BP 99/79   Pulse 64   Temp 97.9 F (36.6 C) (Oral)   Resp 18   SpO2 99%   Physical Exam  Constitutional: He is oriented to person, place, and time. He appears well-developed.  HENT:  Head: Normocephalic and atraumatic.  Eyes: Pupils are equal, round, and reactive to light. Conjunctivae and EOM are normal.  Neck: Normal range of motion. Neck supple.  Cardiovascular: Normal rate and regular rhythm.  Pulmonary/Chest: Effort normal and breath sounds normal.  Abdominal: Soft. Bowel sounds are normal. He exhibits no distension and no mass. There is no tenderness.  Digital rectal exam shows bright red blood per rectum.  Musculoskeletal: He exhibits no deformity.  Neurological: He is alert and oriented to person, place, and time.  Skin: Skin is warm.  Nursing note and vitals reviewed.    ED  Treatments / Results  Labs (all labs ordered are listed, but only abnormal results are displayed) Labs Reviewed  CBC WITH DIFFERENTIAL/PLATELET - Abnormal; Notable for the following components:      Result Value   MCV 107.1 (*)    Eosinophils Absolute 0.8 (*)    All other components within normal limits  COMPREHENSIVE METABOLIC PANEL - Abnormal; Notable for the following components:   Potassium 5.4 (*)    Chloride 96 (*)    Glucose, Bld 116 (*)    BUN 40 (*)    Creatinine, Ser 7.70 (*)    Total Protein 8.4 (*)    Total Bilirubin 0.2 (*)    GFR calc non Af Amer 6 (*)    GFR calc Af Amer 7 (*)    All other components within normal limits  POC OCCULT BLOOD, ED - Abnormal; Notable for the  following components:   Fecal Occult Bld POSITIVE (*)    All other components within normal limits  PROTIME-INR  TYPE AND SCREEN  ABO/RH    EKG None  Radiology No results found.  Procedures Procedures (including critical care time)  Medications Ordered in ED Medications - No data to display   Initial Impression / Assessment and Plan / ED Course  I have reviewed the triage vital signs and the nursing notes.  Pertinent labs & imaging results that were available during my care of the patient were reviewed by me and considered in my medical decision making (see chart for details).  Clinical Course as of Nov 14 713  Fri Nov 14, 2017  1638 Dr. Watt Climes, GI call us back. He informed us that he would be comfortable seeing in the patient clinic either later today or on Monday if patient is also comfortable.  Dr. Watt Climes is aware that patient is not on any blood thinners, but he does have dialysis history.  I discussed the recommendations from GI team.  Patient was given the option of staying in the hospital as an observation stay versus going home with strict ER return precautions and prompt follow-up with GI.  Patient has preferred the latter.  His hemoglobin is 14.1.  Strict ER return precautions  have been discussed and patient is comfortable with the plan of returning to the ER if his bleeding gets worse or if he starts having dizziness, lightheadedness fatigue.   [AN]  0715 Mildly elevated only  Potassium(!): 5.4 [AN]    Clinical Course User Index [AN] Varney Biles, MD    Patient comes in with chief complaint of rectal bleeding. Patient has a history of lymphoma, however he has been cancer free since 2017.  Patient has never had any colonoscopy in the past.  He is having painless rectal bleeding, even when he is not having a bowel movement.  Patient denies any chest pain, shortness of breath, dizziness.  He is not on any blood thinners.  However, patient does have ESRD which does need to some platelet dysfunction.  CBC has been ordered along with INR.  Final Clinical Impressions(s) / ED Diagnoses   Final diagnoses:  Acute lower GI bleeding    ED Discharge Orders    None       Varney Biles, MD 11/14/17 [phone]

## 2017-11-14 NOTE — Discharge Instructions (Addendum)
We saw in the ER for rectal bleeding.  As discussed, your hemoglobin is normal.  The GI team is happy to see you in the clinic either later today or on Monday if you are comfortable going home, and you have informed us that you indeed are comfortable with outpatient work-up.  Please call the GI doctor and set up an appointment for later today or Monday.  Return to the ER if your bleeding gets worse or you start developing dizziness, lightheadedness, chest pain or shortness of breath.  We also recommend clear liquid diet for the next 2 to 3 days.

## 2017-11-14 NOTE — ED Notes (Signed)
Pt wife has been notified of discharge, will be coming to pick the patient up

## 2017-11-15 DIAGNOSIS — Z992 Dependence on renal dialysis: Secondary | ICD-10-CM | POA: Diagnosis not present

## 2017-11-15 DIAGNOSIS — D509 Iron deficiency anemia, unspecified: Secondary | ICD-10-CM | POA: Diagnosis not present

## 2017-11-15 DIAGNOSIS — N2581 Secondary hyperparathyroidism of renal origin: Secondary | ICD-10-CM | POA: Diagnosis not present

## 2017-11-15 DIAGNOSIS — E119 Type 2 diabetes mellitus without complications: Secondary | ICD-10-CM | POA: Diagnosis not present

## 2017-11-15 DIAGNOSIS — N186 End stage renal disease: Secondary | ICD-10-CM | POA: Diagnosis not present

## 2017-11-17 DIAGNOSIS — K921 Melena: Secondary | ICD-10-CM | POA: Diagnosis not present

## 2017-11-18 DIAGNOSIS — D509 Iron deficiency anemia, unspecified: Secondary | ICD-10-CM | POA: Diagnosis not present

## 2017-11-18 DIAGNOSIS — E119 Type 2 diabetes mellitus without complications: Secondary | ICD-10-CM | POA: Diagnosis not present

## 2017-11-18 DIAGNOSIS — N2581 Secondary hyperparathyroidism of renal origin: Secondary | ICD-10-CM | POA: Diagnosis not present

## 2017-11-18 DIAGNOSIS — N186 End stage renal disease: Secondary | ICD-10-CM | POA: Diagnosis not present

## 2017-11-18 DIAGNOSIS — Z992 Dependence on renal dialysis: Secondary | ICD-10-CM | POA: Diagnosis not present

## 2017-11-20 DIAGNOSIS — Z992 Dependence on renal dialysis: Secondary | ICD-10-CM | POA: Diagnosis not present

## 2017-11-20 DIAGNOSIS — E119 Type 2 diabetes mellitus without complications: Secondary | ICD-10-CM | POA: Diagnosis not present

## 2017-11-20 DIAGNOSIS — D509 Iron deficiency anemia, unspecified: Secondary | ICD-10-CM | POA: Diagnosis not present

## 2017-11-20 DIAGNOSIS — N186 End stage renal disease: Secondary | ICD-10-CM | POA: Diagnosis not present

## 2017-11-20 DIAGNOSIS — N2581 Secondary hyperparathyroidism of renal origin: Secondary | ICD-10-CM | POA: Diagnosis not present

## 2017-11-22 DIAGNOSIS — E119 Type 2 diabetes mellitus without complications: Secondary | ICD-10-CM | POA: Diagnosis not present

## 2017-11-22 DIAGNOSIS — Z992 Dependence on renal dialysis: Secondary | ICD-10-CM | POA: Diagnosis not present

## 2017-11-22 DIAGNOSIS — D509 Iron deficiency anemia, unspecified: Secondary | ICD-10-CM | POA: Diagnosis not present

## 2017-11-22 DIAGNOSIS — N186 End stage renal disease: Secondary | ICD-10-CM | POA: Diagnosis not present

## 2017-11-22 DIAGNOSIS — N2581 Secondary hyperparathyroidism of renal origin: Secondary | ICD-10-CM | POA: Diagnosis not present

## 2017-11-25 DIAGNOSIS — N2581 Secondary hyperparathyroidism of renal origin: Secondary | ICD-10-CM | POA: Diagnosis not present

## 2017-11-25 DIAGNOSIS — Z992 Dependence on renal dialysis: Secondary | ICD-10-CM | POA: Diagnosis not present

## 2017-11-25 DIAGNOSIS — D509 Iron deficiency anemia, unspecified: Secondary | ICD-10-CM | POA: Diagnosis not present

## 2017-11-25 DIAGNOSIS — E119 Type 2 diabetes mellitus without complications: Secondary | ICD-10-CM | POA: Diagnosis not present

## 2017-11-25 DIAGNOSIS — N186 End stage renal disease: Secondary | ICD-10-CM | POA: Diagnosis not present

## 2017-11-27 DIAGNOSIS — D509 Iron deficiency anemia, unspecified: Secondary | ICD-10-CM | POA: Diagnosis not present

## 2017-11-27 DIAGNOSIS — Z992 Dependence on renal dialysis: Secondary | ICD-10-CM | POA: Diagnosis not present

## 2017-11-27 DIAGNOSIS — N186 End stage renal disease: Secondary | ICD-10-CM | POA: Diagnosis not present

## 2017-11-27 DIAGNOSIS — N2581 Secondary hyperparathyroidism of renal origin: Secondary | ICD-10-CM | POA: Diagnosis not present

## 2017-11-27 DIAGNOSIS — E119 Type 2 diabetes mellitus without complications: Secondary | ICD-10-CM | POA: Diagnosis not present

## 2017-11-28 DIAGNOSIS — I129 Hypertensive chronic kidney disease with stage 1 through stage 4 chronic kidney disease, or unspecified chronic kidney disease: Secondary | ICD-10-CM | POA: Diagnosis not present

## 2017-11-28 DIAGNOSIS — Z992 Dependence on renal dialysis: Secondary | ICD-10-CM | POA: Diagnosis not present

## 2017-11-28 DIAGNOSIS — N186 End stage renal disease: Secondary | ICD-10-CM | POA: Diagnosis not present

## 2017-11-29 DIAGNOSIS — N186 End stage renal disease: Secondary | ICD-10-CM | POA: Diagnosis not present

## 2017-11-29 DIAGNOSIS — N2581 Secondary hyperparathyroidism of renal origin: Secondary | ICD-10-CM | POA: Diagnosis not present

## 2017-11-29 DIAGNOSIS — Z992 Dependence on renal dialysis: Secondary | ICD-10-CM | POA: Diagnosis not present

## 2017-11-29 DIAGNOSIS — E119 Type 2 diabetes mellitus without complications: Secondary | ICD-10-CM | POA: Diagnosis not present

## 2017-12-02 DIAGNOSIS — E119 Type 2 diabetes mellitus without complications: Secondary | ICD-10-CM | POA: Diagnosis not present

## 2017-12-02 DIAGNOSIS — N186 End stage renal disease: Secondary | ICD-10-CM | POA: Diagnosis not present

## 2017-12-02 DIAGNOSIS — N2581 Secondary hyperparathyroidism of renal origin: Secondary | ICD-10-CM | POA: Diagnosis not present

## 2017-12-02 DIAGNOSIS — Z992 Dependence on renal dialysis: Secondary | ICD-10-CM | POA: Diagnosis not present

## 2017-12-04 DIAGNOSIS — N186 End stage renal disease: Secondary | ICD-10-CM | POA: Diagnosis not present

## 2017-12-04 DIAGNOSIS — N2581 Secondary hyperparathyroidism of renal origin: Secondary | ICD-10-CM | POA: Diagnosis not present

## 2017-12-04 DIAGNOSIS — E119 Type 2 diabetes mellitus without complications: Secondary | ICD-10-CM | POA: Diagnosis not present

## 2017-12-04 DIAGNOSIS — Z992 Dependence on renal dialysis: Secondary | ICD-10-CM | POA: Diagnosis not present

## 2017-12-06 DIAGNOSIS — N2581 Secondary hyperparathyroidism of renal origin: Secondary | ICD-10-CM | POA: Diagnosis not present

## 2017-12-06 DIAGNOSIS — N186 End stage renal disease: Secondary | ICD-10-CM | POA: Diagnosis not present

## 2017-12-06 DIAGNOSIS — Z992 Dependence on renal dialysis: Secondary | ICD-10-CM | POA: Diagnosis not present

## 2017-12-06 DIAGNOSIS — E119 Type 2 diabetes mellitus without complications: Secondary | ICD-10-CM | POA: Diagnosis not present

## 2017-12-09 DIAGNOSIS — N2581 Secondary hyperparathyroidism of renal origin: Secondary | ICD-10-CM | POA: Diagnosis not present

## 2017-12-09 DIAGNOSIS — E119 Type 2 diabetes mellitus without complications: Secondary | ICD-10-CM | POA: Diagnosis not present

## 2017-12-09 DIAGNOSIS — Z992 Dependence on renal dialysis: Secondary | ICD-10-CM | POA: Diagnosis not present

## 2017-12-09 DIAGNOSIS — N186 End stage renal disease: Secondary | ICD-10-CM | POA: Diagnosis not present

## 2017-12-11 DIAGNOSIS — E119 Type 2 diabetes mellitus without complications: Secondary | ICD-10-CM | POA: Diagnosis not present

## 2017-12-11 DIAGNOSIS — N2581 Secondary hyperparathyroidism of renal origin: Secondary | ICD-10-CM | POA: Diagnosis not present

## 2017-12-11 DIAGNOSIS — Z992 Dependence on renal dialysis: Secondary | ICD-10-CM | POA: Diagnosis not present

## 2017-12-11 DIAGNOSIS — N186 End stage renal disease: Secondary | ICD-10-CM | POA: Diagnosis not present

## 2017-12-13 DIAGNOSIS — E119 Type 2 diabetes mellitus without complications: Secondary | ICD-10-CM | POA: Diagnosis not present

## 2017-12-13 DIAGNOSIS — N2581 Secondary hyperparathyroidism of renal origin: Secondary | ICD-10-CM | POA: Diagnosis not present

## 2017-12-13 DIAGNOSIS — Z992 Dependence on renal dialysis: Secondary | ICD-10-CM | POA: Diagnosis not present

## 2017-12-13 DIAGNOSIS — N186 End stage renal disease: Secondary | ICD-10-CM | POA: Diagnosis not present

## 2017-12-16 DIAGNOSIS — E119 Type 2 diabetes mellitus without complications: Secondary | ICD-10-CM | POA: Diagnosis not present

## 2017-12-16 DIAGNOSIS — N186 End stage renal disease: Secondary | ICD-10-CM | POA: Diagnosis not present

## 2017-12-16 DIAGNOSIS — Z992 Dependence on renal dialysis: Secondary | ICD-10-CM | POA: Diagnosis not present

## 2017-12-16 DIAGNOSIS — N2581 Secondary hyperparathyroidism of renal origin: Secondary | ICD-10-CM | POA: Diagnosis not present

## 2017-12-18 DIAGNOSIS — N2581 Secondary hyperparathyroidism of renal origin: Secondary | ICD-10-CM | POA: Diagnosis not present

## 2017-12-18 DIAGNOSIS — Z992 Dependence on renal dialysis: Secondary | ICD-10-CM | POA: Diagnosis not present

## 2017-12-18 DIAGNOSIS — E119 Type 2 diabetes mellitus without complications: Secondary | ICD-10-CM | POA: Diagnosis not present

## 2017-12-18 DIAGNOSIS — N186 End stage renal disease: Secondary | ICD-10-CM | POA: Diagnosis not present

## 2017-12-20 DIAGNOSIS — N2581 Secondary hyperparathyroidism of renal origin: Secondary | ICD-10-CM | POA: Diagnosis not present

## 2017-12-20 DIAGNOSIS — E119 Type 2 diabetes mellitus without complications: Secondary | ICD-10-CM | POA: Diagnosis not present

## 2017-12-20 DIAGNOSIS — N186 End stage renal disease: Secondary | ICD-10-CM | POA: Diagnosis not present

## 2017-12-20 DIAGNOSIS — Z992 Dependence on renal dialysis: Secondary | ICD-10-CM | POA: Diagnosis not present

## 2017-12-22 DIAGNOSIS — Z992 Dependence on renal dialysis: Secondary | ICD-10-CM | POA: Diagnosis not present

## 2017-12-22 DIAGNOSIS — E119 Type 2 diabetes mellitus without complications: Secondary | ICD-10-CM | POA: Diagnosis not present

## 2017-12-22 DIAGNOSIS — N2581 Secondary hyperparathyroidism of renal origin: Secondary | ICD-10-CM | POA: Diagnosis not present

## 2017-12-22 DIAGNOSIS — N186 End stage renal disease: Secondary | ICD-10-CM | POA: Diagnosis not present

## 2017-12-24 DIAGNOSIS — N186 End stage renal disease: Secondary | ICD-10-CM | POA: Diagnosis not present

## 2017-12-24 DIAGNOSIS — E119 Type 2 diabetes mellitus without complications: Secondary | ICD-10-CM | POA: Diagnosis not present

## 2017-12-24 DIAGNOSIS — Z992 Dependence on renal dialysis: Secondary | ICD-10-CM | POA: Diagnosis not present

## 2017-12-24 DIAGNOSIS — N2581 Secondary hyperparathyroidism of renal origin: Secondary | ICD-10-CM | POA: Diagnosis not present

## 2017-12-27 DIAGNOSIS — N186 End stage renal disease: Secondary | ICD-10-CM | POA: Diagnosis not present

## 2017-12-27 DIAGNOSIS — E119 Type 2 diabetes mellitus without complications: Secondary | ICD-10-CM | POA: Diagnosis not present

## 2017-12-27 DIAGNOSIS — Z992 Dependence on renal dialysis: Secondary | ICD-10-CM | POA: Diagnosis not present

## 2017-12-27 DIAGNOSIS — N2581 Secondary hyperparathyroidism of renal origin: Secondary | ICD-10-CM | POA: Diagnosis not present

## 2017-12-28 DIAGNOSIS — N186 End stage renal disease: Secondary | ICD-10-CM | POA: Diagnosis not present

## 2017-12-28 DIAGNOSIS — Z992 Dependence on renal dialysis: Secondary | ICD-10-CM | POA: Diagnosis not present

## 2017-12-28 DIAGNOSIS — I129 Hypertensive chronic kidney disease with stage 1 through stage 4 chronic kidney disease, or unspecified chronic kidney disease: Secondary | ICD-10-CM | POA: Diagnosis not present

## 2017-12-30 DIAGNOSIS — E119 Type 2 diabetes mellitus without complications: Secondary | ICD-10-CM | POA: Diagnosis not present

## 2017-12-30 DIAGNOSIS — N186 End stage renal disease: Secondary | ICD-10-CM | POA: Diagnosis not present

## 2017-12-30 DIAGNOSIS — D631 Anemia in chronic kidney disease: Secondary | ICD-10-CM | POA: Diagnosis not present

## 2017-12-30 DIAGNOSIS — N2581 Secondary hyperparathyroidism of renal origin: Secondary | ICD-10-CM | POA: Diagnosis not present

## 2017-12-30 DIAGNOSIS — Z992 Dependence on renal dialysis: Secondary | ICD-10-CM | POA: Diagnosis not present

## 2018-01-01 DIAGNOSIS — D631 Anemia in chronic kidney disease: Secondary | ICD-10-CM | POA: Diagnosis not present

## 2018-01-01 DIAGNOSIS — N186 End stage renal disease: Secondary | ICD-10-CM | POA: Diagnosis not present

## 2018-01-01 DIAGNOSIS — E119 Type 2 diabetes mellitus without complications: Secondary | ICD-10-CM | POA: Diagnosis not present

## 2018-01-01 DIAGNOSIS — Z992 Dependence on renal dialysis: Secondary | ICD-10-CM | POA: Diagnosis not present

## 2018-01-01 DIAGNOSIS — N2581 Secondary hyperparathyroidism of renal origin: Secondary | ICD-10-CM | POA: Diagnosis not present

## 2018-01-03 DIAGNOSIS — N186 End stage renal disease: Secondary | ICD-10-CM | POA: Diagnosis not present

## 2018-01-03 DIAGNOSIS — D631 Anemia in chronic kidney disease: Secondary | ICD-10-CM | POA: Diagnosis not present

## 2018-01-03 DIAGNOSIS — E119 Type 2 diabetes mellitus without complications: Secondary | ICD-10-CM | POA: Diagnosis not present

## 2018-01-03 DIAGNOSIS — N2581 Secondary hyperparathyroidism of renal origin: Secondary | ICD-10-CM | POA: Diagnosis not present

## 2018-01-03 DIAGNOSIS — Z992 Dependence on renal dialysis: Secondary | ICD-10-CM | POA: Diagnosis not present

## 2018-01-06 DIAGNOSIS — D631 Anemia in chronic kidney disease: Secondary | ICD-10-CM | POA: Diagnosis not present

## 2018-01-06 DIAGNOSIS — N2581 Secondary hyperparathyroidism of renal origin: Secondary | ICD-10-CM | POA: Diagnosis not present

## 2018-01-06 DIAGNOSIS — E119 Type 2 diabetes mellitus without complications: Secondary | ICD-10-CM | POA: Diagnosis not present

## 2018-01-06 DIAGNOSIS — N186 End stage renal disease: Secondary | ICD-10-CM | POA: Diagnosis not present

## 2018-01-06 DIAGNOSIS — Z992 Dependence on renal dialysis: Secondary | ICD-10-CM | POA: Diagnosis not present

## 2018-01-08 DIAGNOSIS — E119 Type 2 diabetes mellitus without complications: Secondary | ICD-10-CM | POA: Diagnosis not present

## 2018-01-08 DIAGNOSIS — Z992 Dependence on renal dialysis: Secondary | ICD-10-CM | POA: Diagnosis not present

## 2018-01-08 DIAGNOSIS — N2581 Secondary hyperparathyroidism of renal origin: Secondary | ICD-10-CM | POA: Diagnosis not present

## 2018-01-08 DIAGNOSIS — D631 Anemia in chronic kidney disease: Secondary | ICD-10-CM | POA: Diagnosis not present

## 2018-01-08 DIAGNOSIS — N186 End stage renal disease: Secondary | ICD-10-CM | POA: Diagnosis not present

## 2018-01-10 DIAGNOSIS — D631 Anemia in chronic kidney disease: Secondary | ICD-10-CM | POA: Diagnosis not present

## 2018-01-10 DIAGNOSIS — N2581 Secondary hyperparathyroidism of renal origin: Secondary | ICD-10-CM | POA: Diagnosis not present

## 2018-01-10 DIAGNOSIS — E119 Type 2 diabetes mellitus without complications: Secondary | ICD-10-CM | POA: Diagnosis not present

## 2018-01-10 DIAGNOSIS — N186 End stage renal disease: Secondary | ICD-10-CM | POA: Diagnosis not present

## 2018-01-10 DIAGNOSIS — Z992 Dependence on renal dialysis: Secondary | ICD-10-CM | POA: Diagnosis not present

## 2018-01-12 DIAGNOSIS — K6389 Other specified diseases of intestine: Secondary | ICD-10-CM | POA: Diagnosis not present

## 2018-01-12 DIAGNOSIS — D120 Benign neoplasm of cecum: Secondary | ICD-10-CM | POA: Diagnosis not present

## 2018-01-12 DIAGNOSIS — Z1211 Encounter for screening for malignant neoplasm of colon: Secondary | ICD-10-CM | POA: Diagnosis not present

## 2018-01-12 DIAGNOSIS — K5730 Diverticulosis of large intestine without perforation or abscess without bleeding: Secondary | ICD-10-CM | POA: Diagnosis not present

## 2018-01-12 LAB — HM COLONOSCOPY

## 2018-01-13 DIAGNOSIS — Z992 Dependence on renal dialysis: Secondary | ICD-10-CM | POA: Diagnosis not present

## 2018-01-13 DIAGNOSIS — N2581 Secondary hyperparathyroidism of renal origin: Secondary | ICD-10-CM | POA: Diagnosis not present

## 2018-01-13 DIAGNOSIS — E119 Type 2 diabetes mellitus without complications: Secondary | ICD-10-CM | POA: Diagnosis not present

## 2018-01-13 DIAGNOSIS — D631 Anemia in chronic kidney disease: Secondary | ICD-10-CM | POA: Diagnosis not present

## 2018-01-13 DIAGNOSIS — N186 End stage renal disease: Secondary | ICD-10-CM | POA: Diagnosis not present

## 2018-01-14 DIAGNOSIS — D120 Benign neoplasm of cecum: Secondary | ICD-10-CM | POA: Diagnosis not present

## 2018-01-15 DIAGNOSIS — N186 End stage renal disease: Secondary | ICD-10-CM | POA: Diagnosis not present

## 2018-01-15 DIAGNOSIS — Z992 Dependence on renal dialysis: Secondary | ICD-10-CM | POA: Diagnosis not present

## 2018-01-15 DIAGNOSIS — E119 Type 2 diabetes mellitus without complications: Secondary | ICD-10-CM | POA: Diagnosis not present

## 2018-01-15 DIAGNOSIS — N2581 Secondary hyperparathyroidism of renal origin: Secondary | ICD-10-CM | POA: Diagnosis not present

## 2018-01-15 DIAGNOSIS — D631 Anemia in chronic kidney disease: Secondary | ICD-10-CM | POA: Diagnosis not present

## 2018-01-17 DIAGNOSIS — N2581 Secondary hyperparathyroidism of renal origin: Secondary | ICD-10-CM | POA: Diagnosis not present

## 2018-01-17 DIAGNOSIS — Z992 Dependence on renal dialysis: Secondary | ICD-10-CM | POA: Diagnosis not present

## 2018-01-17 DIAGNOSIS — D631 Anemia in chronic kidney disease: Secondary | ICD-10-CM | POA: Diagnosis not present

## 2018-01-17 DIAGNOSIS — E119 Type 2 diabetes mellitus without complications: Secondary | ICD-10-CM | POA: Diagnosis not present

## 2018-01-17 DIAGNOSIS — N186 End stage renal disease: Secondary | ICD-10-CM | POA: Diagnosis not present

## 2018-01-19 DIAGNOSIS — D631 Anemia in chronic kidney disease: Secondary | ICD-10-CM | POA: Diagnosis not present

## 2018-01-19 DIAGNOSIS — Z992 Dependence on renal dialysis: Secondary | ICD-10-CM | POA: Diagnosis not present

## 2018-01-19 DIAGNOSIS — N186 End stage renal disease: Secondary | ICD-10-CM | POA: Diagnosis not present

## 2018-01-19 DIAGNOSIS — E119 Type 2 diabetes mellitus without complications: Secondary | ICD-10-CM | POA: Diagnosis not present

## 2018-01-19 DIAGNOSIS — N2581 Secondary hyperparathyroidism of renal origin: Secondary | ICD-10-CM | POA: Diagnosis not present

## 2018-01-22 DIAGNOSIS — E119 Type 2 diabetes mellitus without complications: Secondary | ICD-10-CM | POA: Diagnosis not present

## 2018-01-22 DIAGNOSIS — N2581 Secondary hyperparathyroidism of renal origin: Secondary | ICD-10-CM | POA: Diagnosis not present

## 2018-01-22 DIAGNOSIS — N186 End stage renal disease: Secondary | ICD-10-CM | POA: Diagnosis not present

## 2018-01-22 DIAGNOSIS — Z992 Dependence on renal dialysis: Secondary | ICD-10-CM | POA: Diagnosis not present

## 2018-01-22 DIAGNOSIS — D631 Anemia in chronic kidney disease: Secondary | ICD-10-CM | POA: Diagnosis not present

## 2018-01-24 DIAGNOSIS — E119 Type 2 diabetes mellitus without complications: Secondary | ICD-10-CM | POA: Diagnosis not present

## 2018-01-24 DIAGNOSIS — Z992 Dependence on renal dialysis: Secondary | ICD-10-CM | POA: Diagnosis not present

## 2018-01-24 DIAGNOSIS — N2581 Secondary hyperparathyroidism of renal origin: Secondary | ICD-10-CM | POA: Diagnosis not present

## 2018-01-24 DIAGNOSIS — N186 End stage renal disease: Secondary | ICD-10-CM | POA: Diagnosis not present

## 2018-01-24 DIAGNOSIS — D631 Anemia in chronic kidney disease: Secondary | ICD-10-CM | POA: Diagnosis not present

## 2018-01-26 DIAGNOSIS — Z992 Dependence on renal dialysis: Secondary | ICD-10-CM | POA: Diagnosis not present

## 2018-01-26 DIAGNOSIS — N2581 Secondary hyperparathyroidism of renal origin: Secondary | ICD-10-CM | POA: Diagnosis not present

## 2018-01-26 DIAGNOSIS — N186 End stage renal disease: Secondary | ICD-10-CM | POA: Diagnosis not present

## 2018-01-26 DIAGNOSIS — E119 Type 2 diabetes mellitus without complications: Secondary | ICD-10-CM | POA: Diagnosis not present

## 2018-01-26 DIAGNOSIS — D631 Anemia in chronic kidney disease: Secondary | ICD-10-CM | POA: Diagnosis not present

## 2018-01-28 DIAGNOSIS — N186 End stage renal disease: Secondary | ICD-10-CM | POA: Diagnosis not present

## 2018-01-28 DIAGNOSIS — I129 Hypertensive chronic kidney disease with stage 1 through stage 4 chronic kidney disease, or unspecified chronic kidney disease: Secondary | ICD-10-CM | POA: Diagnosis not present

## 2018-01-28 DIAGNOSIS — Z992 Dependence on renal dialysis: Secondary | ICD-10-CM | POA: Diagnosis not present

## 2018-01-29 DIAGNOSIS — N186 End stage renal disease: Secondary | ICD-10-CM | POA: Diagnosis not present

## 2018-01-29 DIAGNOSIS — N2581 Secondary hyperparathyroidism of renal origin: Secondary | ICD-10-CM | POA: Diagnosis not present

## 2018-01-29 DIAGNOSIS — E119 Type 2 diabetes mellitus without complications: Secondary | ICD-10-CM | POA: Diagnosis not present

## 2018-01-29 DIAGNOSIS — Z992 Dependence on renal dialysis: Secondary | ICD-10-CM | POA: Diagnosis not present

## 2018-01-31 DIAGNOSIS — E119 Type 2 diabetes mellitus without complications: Secondary | ICD-10-CM | POA: Diagnosis not present

## 2018-01-31 DIAGNOSIS — N186 End stage renal disease: Secondary | ICD-10-CM | POA: Diagnosis not present

## 2018-01-31 DIAGNOSIS — Z992 Dependence on renal dialysis: Secondary | ICD-10-CM | POA: Diagnosis not present

## 2018-01-31 DIAGNOSIS — N2581 Secondary hyperparathyroidism of renal origin: Secondary | ICD-10-CM | POA: Diagnosis not present

## 2018-02-02 DIAGNOSIS — M21961 Unspecified acquired deformity of right lower leg: Secondary | ICD-10-CM | POA: Diagnosis not present

## 2018-02-02 DIAGNOSIS — E1351 Other specified diabetes mellitus with diabetic peripheral angiopathy without gangrene: Secondary | ICD-10-CM | POA: Diagnosis not present

## 2018-02-02 DIAGNOSIS — B351 Tinea unguium: Secondary | ICD-10-CM | POA: Diagnosis not present

## 2018-02-02 DIAGNOSIS — M79671 Pain in right foot: Secondary | ICD-10-CM | POA: Diagnosis not present

## 2018-02-02 DIAGNOSIS — L602 Onychogryphosis: Secondary | ICD-10-CM | POA: Diagnosis not present

## 2018-02-02 DIAGNOSIS — L84 Corns and callosities: Secondary | ICD-10-CM | POA: Diagnosis not present

## 2018-02-03 DIAGNOSIS — N186 End stage renal disease: Secondary | ICD-10-CM | POA: Diagnosis not present

## 2018-02-03 DIAGNOSIS — N2581 Secondary hyperparathyroidism of renal origin: Secondary | ICD-10-CM | POA: Diagnosis not present

## 2018-02-03 DIAGNOSIS — Z992 Dependence on renal dialysis: Secondary | ICD-10-CM | POA: Diagnosis not present

## 2018-02-03 DIAGNOSIS — E119 Type 2 diabetes mellitus without complications: Secondary | ICD-10-CM | POA: Diagnosis not present

## 2018-02-05 DIAGNOSIS — N2581 Secondary hyperparathyroidism of renal origin: Secondary | ICD-10-CM | POA: Diagnosis not present

## 2018-02-05 DIAGNOSIS — N186 End stage renal disease: Secondary | ICD-10-CM | POA: Diagnosis not present

## 2018-02-05 DIAGNOSIS — Z992 Dependence on renal dialysis: Secondary | ICD-10-CM | POA: Diagnosis not present

## 2018-02-05 DIAGNOSIS — E119 Type 2 diabetes mellitus without complications: Secondary | ICD-10-CM | POA: Diagnosis not present

## 2018-02-07 DIAGNOSIS — N2581 Secondary hyperparathyroidism of renal origin: Secondary | ICD-10-CM | POA: Diagnosis not present

## 2018-02-07 DIAGNOSIS — N186 End stage renal disease: Secondary | ICD-10-CM | POA: Diagnosis not present

## 2018-02-07 DIAGNOSIS — Z992 Dependence on renal dialysis: Secondary | ICD-10-CM | POA: Diagnosis not present

## 2018-02-07 DIAGNOSIS — E119 Type 2 diabetes mellitus without complications: Secondary | ICD-10-CM | POA: Diagnosis not present

## 2018-02-10 DIAGNOSIS — N2581 Secondary hyperparathyroidism of renal origin: Secondary | ICD-10-CM | POA: Diagnosis not present

## 2018-02-10 DIAGNOSIS — N186 End stage renal disease: Secondary | ICD-10-CM | POA: Diagnosis not present

## 2018-02-10 DIAGNOSIS — B351 Tinea unguium: Secondary | ICD-10-CM | POA: Diagnosis not present

## 2018-02-10 DIAGNOSIS — E119 Type 2 diabetes mellitus without complications: Secondary | ICD-10-CM | POA: Diagnosis not present

## 2018-02-10 DIAGNOSIS — Z992 Dependence on renal dialysis: Secondary | ICD-10-CM | POA: Diagnosis not present

## 2018-02-12 DIAGNOSIS — Z992 Dependence on renal dialysis: Secondary | ICD-10-CM | POA: Diagnosis not present

## 2018-02-12 DIAGNOSIS — N186 End stage renal disease: Secondary | ICD-10-CM | POA: Diagnosis not present

## 2018-02-12 DIAGNOSIS — E119 Type 2 diabetes mellitus without complications: Secondary | ICD-10-CM | POA: Diagnosis not present

## 2018-02-12 DIAGNOSIS — N2581 Secondary hyperparathyroidism of renal origin: Secondary | ICD-10-CM | POA: Diagnosis not present

## 2018-02-14 DIAGNOSIS — E119 Type 2 diabetes mellitus without complications: Secondary | ICD-10-CM | POA: Diagnosis not present

## 2018-02-14 DIAGNOSIS — N186 End stage renal disease: Secondary | ICD-10-CM | POA: Diagnosis not present

## 2018-02-14 DIAGNOSIS — Z992 Dependence on renal dialysis: Secondary | ICD-10-CM | POA: Diagnosis not present

## 2018-02-14 DIAGNOSIS — N2581 Secondary hyperparathyroidism of renal origin: Secondary | ICD-10-CM | POA: Diagnosis not present

## 2018-02-17 DIAGNOSIS — E119 Type 2 diabetes mellitus without complications: Secondary | ICD-10-CM | POA: Diagnosis not present

## 2018-02-17 DIAGNOSIS — N186 End stage renal disease: Secondary | ICD-10-CM | POA: Diagnosis not present

## 2018-02-17 DIAGNOSIS — N2581 Secondary hyperparathyroidism of renal origin: Secondary | ICD-10-CM | POA: Diagnosis not present

## 2018-02-17 DIAGNOSIS — Z992 Dependence on renal dialysis: Secondary | ICD-10-CM | POA: Diagnosis not present

## 2018-02-19 DIAGNOSIS — E119 Type 2 diabetes mellitus without complications: Secondary | ICD-10-CM | POA: Diagnosis not present

## 2018-02-19 DIAGNOSIS — Z992 Dependence on renal dialysis: Secondary | ICD-10-CM | POA: Diagnosis not present

## 2018-02-19 DIAGNOSIS — N186 End stage renal disease: Secondary | ICD-10-CM | POA: Diagnosis not present

## 2018-02-19 DIAGNOSIS — N2581 Secondary hyperparathyroidism of renal origin: Secondary | ICD-10-CM | POA: Diagnosis not present

## 2018-02-20 ENCOUNTER — Encounter: Payer: Self-pay | Admitting: Nurse Practitioner

## 2018-02-20 ENCOUNTER — Ambulatory Visit (INDEPENDENT_AMBULATORY_CARE_PROVIDER_SITE_OTHER): Payer: Medicare Other | Admitting: Nurse Practitioner

## 2018-02-20 ENCOUNTER — Other Ambulatory Visit: Payer: Self-pay

## 2018-02-20 VITALS — BP 118/86 | HR 68 | Temp 98.0°F | Resp 16 | Ht 71.0 in | Wt 198.0 lb

## 2018-02-20 DIAGNOSIS — N186 End stage renal disease: Secondary | ICD-10-CM

## 2018-02-20 DIAGNOSIS — Z992 Dependence on renal dialysis: Secondary | ICD-10-CM

## 2018-02-20 DIAGNOSIS — R7303 Prediabetes: Secondary | ICD-10-CM

## 2018-02-20 DIAGNOSIS — I120 Hypertensive chronic kidney disease with stage 5 chronic kidney disease or end stage renal disease: Secondary | ICD-10-CM

## 2018-02-20 DIAGNOSIS — E039 Hypothyroidism, unspecified: Secondary | ICD-10-CM | POA: Diagnosis not present

## 2018-02-20 NOTE — Patient Instructions (Signed)
° ° ° °  If you have lab work done today you will be contacted with your lab results within the next 2 weeks.  If you have not heard from us then please contact us. The fastest way to get your results is to register for My Chart. ° ° °IF you received an x-ray today, you will receive an invoice from [facility]. Please contact [facility] at [phone] with questions or concerns regarding your invoice.  ° °IF you received labwork today, you will receive an invoice from [facility]. Please contact [facility] at [phone] with questions or concerns regarding your invoice.  ° °Our billing staff will not be able to assist you with questions regarding bills from these companies. ° °You will be contacted with the lab results as soon as they are available. The fastest way to get your results is to activate your My Chart account. Instructions are located on the last page of this paperwork. If you have not heard from us regarding the results in 2 weeks, please contact this office. °  ° ° ° °

## 2018-02-20 NOTE — Progress Notes (Addendum)
Subjective:     Patient ID: Javier Jenkins , male    DOB: 1950/06/16 , 68 y.o.   MRN: [id number]   Chief Complaint  Patient presents with  . Chronic Conditions    follow-up     HPI  Diabetes  He presents for his follow-up diabetic visit. Diabetes type: prediabetes. Pertinent negatives for diabetes include no fatigue. Risk factors for coronary artery disease include diabetes mellitus, sedentary lifestyle and hypertension. Current diabetic treatment includes oral agent (monotherapy). He is compliant with treatment all of the time. He is following a generally healthy diet. He has not had a previous visit with a dietitian. He rarely participates in exercise. His overall blood glucose range is 90-110 mg/dl. An ACE inhibitor/angiotensin II receptor blocker is being taken. Eye exam is current.     Past Medical History:  Diagnosis Date  . Chronic kidney disease    stage 5  . Constipation   . Diabetes mellitus without complication ([place])    not on medications  . 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 Hypertension   . Hypothyroidism   . Non-Hodgkin lymphoma of intra-abdominal lymph nodes ([place]) 06/07/2015  . 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 Stroke [facility])    right side paralysis     Family History  Problem Relation Age of Onset  . Hypertension Mother   . Cancer Paternal Uncle        prostate ca     Current Outpatient Medications:  .  aspirin 325 MG tablet, Take 1 tablet (325 mg total) by mouth daily., Disp: 30 tablet, Rfl: 0 .  aspirin 81 MG chewable tablet, Chew 81 mg by mouth daily., Disp: , Rfl:  .  atorvastatin (LIPITOR) 10 MG tablet, Take 10 mg by mouth daily., Disp: , Rfl:  .  calcium acetate (PHOSLO) 667 MG capsule, Take 1 capsule by mouth daily. , Disp: , Rfl: 11 .  cinacalcet (SENSIPAR) 30 MG tablet, Take 30 mg by mouth daily., Disp: , Rfl:  .  cloNIDine (CATAPRES) 0.2 MG tablet, Take 0.2 mg by mouth daily. , Disp: , Rfl:  .   ferric citrate (AURYXIA) 1 GM 210 MG(Fe) tablet, Take 630 mg by mouth 3 (three) times daily with meals., Disp: , Rfl:  .  GAVILYTE-N WITH FLAVOR PACK 420 g solution, See admin instructions., Disp: , Rfl:  .  levETIRAcetam (KEPPRA) 500 MG tablet, Take 1 tablet (500 mg total) by mouth 2 (two) times daily., Disp: 60 tablet, Rfl: 6 .  levothyroxine (SYNTHROID, LEVOTHROID) 25 MCG tablet, Take 25 mcg by mouth daily. , Disp: , Rfl: 0 .  metoprolol (LOPRESSOR) 100 MG tablet, Take 100 mg by mouth 2 (two) times daily. , Disp: , Rfl:  .  multivitamin (RENA-VIT) TABS tablet, Take 1 tablet by mouth at bedtime., Disp: 90 tablet, Rfl: 0   Allergies  Allergen Reactions  . Tape Dermatitis    Paper tape     Review of Systems  Constitutional: Negative for fatigue.  Respiratory: Negative.   Cardiovascular: Negative.   Gastrointestinal: Negative.   Skin: Negative.      Today's Vitals   02/20/18 1054  BP: 118/86  Pulse: 68  Resp: 16  Temp: 98 F (36.7 C)  TempSrc: Oral  SpO2: 96%  Weight: 198 lb (89.8 kg)  Height: 5\' 11"  (1.803 m)   Body mass index is 27.62 kg/m.   Objective:  Physical Exam Constitutional:      Appearance: Normal  appearance.  Cardiovascular:     Rate and Rhythm: Normal rate and regular rhythm.     Pulses: Normal pulses.     Heart sounds: Normal heart sounds. No murmur.  Pulmonary:     Effort: Pulmonary effort is normal.     Breath sounds: Normal breath sounds.  Musculoskeletal:     Comments: Right hand contracted and right lower extremity has a brace  Neurological:     General: No focal deficit present.     Mental Status: He is alert.  Psychiatric:        Mood and Affect: Mood normal.         Assessment And Plan:     1. Acquired hypothyroidism  Chronic, controlled  Continue with current medications - TSH - T3 - T4, Free  2. End stage renal failure on dialysis [facility])  Continue with hemodialysis   Reports no issues  3. Prediabetes  Chronic,  controlled  No current medications  Encouraged to limit intake of sugary foods and drinks  Encouraged to increase physical activity to 150 minutes per week as tolerated - Hemoglobin A1c       [name], FNP

## 2018-02-21 DIAGNOSIS — E119 Type 2 diabetes mellitus without complications: Secondary | ICD-10-CM | POA: Diagnosis not present

## 2018-02-21 DIAGNOSIS — Z992 Dependence on renal dialysis: Secondary | ICD-10-CM | POA: Diagnosis not present

## 2018-02-21 DIAGNOSIS — N2581 Secondary hyperparathyroidism of renal origin: Secondary | ICD-10-CM | POA: Diagnosis not present

## 2018-02-21 DIAGNOSIS — N186 End stage renal disease: Secondary | ICD-10-CM | POA: Diagnosis not present

## 2018-02-21 LAB — T3: T3 TOTAL: 81 ng/dL (ref 71–180)

## 2018-02-21 LAB — HEMOGLOBIN A1C
Est. average glucose Bld gHb Est-mCnc: 114 mg/dL
HEMOGLOBIN A1C: 5.6 % (ref 4.8–5.6)

## 2018-02-21 LAB — TSH: TSH: 3.66 u[IU]/mL (ref 0.450–4.500)

## 2018-02-21 LAB — T4, FREE: FREE T4: 1.21 ng/dL (ref 0.82–1.77)

## 2018-02-24 ENCOUNTER — Encounter: Payer: Self-pay | Admitting: Nurse Practitioner

## 2018-02-24 DIAGNOSIS — N186 End stage renal disease: Secondary | ICD-10-CM | POA: Diagnosis not present

## 2018-02-24 DIAGNOSIS — E119 Type 2 diabetes mellitus without complications: Secondary | ICD-10-CM | POA: Diagnosis not present

## 2018-02-24 DIAGNOSIS — N2581 Secondary hyperparathyroidism of renal origin: Secondary | ICD-10-CM | POA: Diagnosis not present

## 2018-02-24 DIAGNOSIS — Z992 Dependence on renal dialysis: Secondary | ICD-10-CM | POA: Diagnosis not present

## 2018-02-26 DIAGNOSIS — Z992 Dependence on renal dialysis: Secondary | ICD-10-CM | POA: Diagnosis not present

## 2018-02-26 DIAGNOSIS — N186 End stage renal disease: Secondary | ICD-10-CM | POA: Diagnosis not present

## 2018-02-26 DIAGNOSIS — N2581 Secondary hyperparathyroidism of renal origin: Secondary | ICD-10-CM | POA: Diagnosis not present

## 2018-02-26 DIAGNOSIS — E119 Type 2 diabetes mellitus without complications: Secondary | ICD-10-CM | POA: Diagnosis not present

## 2018-02-28 DIAGNOSIS — I129 Hypertensive chronic kidney disease with stage 1 through stage 4 chronic kidney disease, or unspecified chronic kidney disease: Secondary | ICD-10-CM | POA: Diagnosis not present

## 2018-02-28 DIAGNOSIS — N186 End stage renal disease: Secondary | ICD-10-CM | POA: Diagnosis not present

## 2018-02-28 DIAGNOSIS — N2581 Secondary hyperparathyroidism of renal origin: Secondary | ICD-10-CM | POA: Diagnosis not present

## 2018-02-28 DIAGNOSIS — E119 Type 2 diabetes mellitus without complications: Secondary | ICD-10-CM | POA: Diagnosis not present

## 2018-02-28 DIAGNOSIS — Z992 Dependence on renal dialysis: Secondary | ICD-10-CM | POA: Diagnosis not present

## 2018-03-03 DIAGNOSIS — Z992 Dependence on renal dialysis: Secondary | ICD-10-CM | POA: Diagnosis not present

## 2018-03-03 DIAGNOSIS — E119 Type 2 diabetes mellitus without complications: Secondary | ICD-10-CM | POA: Diagnosis not present

## 2018-03-03 DIAGNOSIS — N2581 Secondary hyperparathyroidism of renal origin: Secondary | ICD-10-CM | POA: Diagnosis not present

## 2018-03-03 DIAGNOSIS — N186 End stage renal disease: Secondary | ICD-10-CM | POA: Diagnosis not present

## 2018-03-05 DIAGNOSIS — N2581 Secondary hyperparathyroidism of renal origin: Secondary | ICD-10-CM | POA: Diagnosis not present

## 2018-03-05 DIAGNOSIS — E119 Type 2 diabetes mellitus without complications: Secondary | ICD-10-CM | POA: Diagnosis not present

## 2018-03-05 DIAGNOSIS — N186 End stage renal disease: Secondary | ICD-10-CM | POA: Diagnosis not present

## 2018-03-05 DIAGNOSIS — Z992 Dependence on renal dialysis: Secondary | ICD-10-CM | POA: Diagnosis not present

## 2018-03-06 DIAGNOSIS — N186 End stage renal disease: Secondary | ICD-10-CM | POA: Diagnosis not present

## 2018-03-06 DIAGNOSIS — Z992 Dependence on renal dialysis: Secondary | ICD-10-CM | POA: Diagnosis not present

## 2018-03-06 DIAGNOSIS — T82858A Stenosis of vascular prosthetic devices, implants and grafts, initial encounter: Secondary | ICD-10-CM | POA: Diagnosis not present

## 2018-03-06 DIAGNOSIS — I871 Compression of vein: Secondary | ICD-10-CM | POA: Diagnosis not present

## 2018-03-07 DIAGNOSIS — Z992 Dependence on renal dialysis: Secondary | ICD-10-CM | POA: Diagnosis not present

## 2018-03-07 DIAGNOSIS — E119 Type 2 diabetes mellitus without complications: Secondary | ICD-10-CM | POA: Diagnosis not present

## 2018-03-07 DIAGNOSIS — N2581 Secondary hyperparathyroidism of renal origin: Secondary | ICD-10-CM | POA: Diagnosis not present

## 2018-03-07 DIAGNOSIS — N186 End stage renal disease: Secondary | ICD-10-CM | POA: Diagnosis not present

## 2018-03-10 DIAGNOSIS — N2581 Secondary hyperparathyroidism of renal origin: Secondary | ICD-10-CM | POA: Diagnosis not present

## 2018-03-10 DIAGNOSIS — N186 End stage renal disease: Secondary | ICD-10-CM | POA: Diagnosis not present

## 2018-03-10 DIAGNOSIS — E119 Type 2 diabetes mellitus without complications: Secondary | ICD-10-CM | POA: Diagnosis not present

## 2018-03-10 DIAGNOSIS — Z992 Dependence on renal dialysis: Secondary | ICD-10-CM | POA: Diagnosis not present

## 2018-03-12 DIAGNOSIS — N2581 Secondary hyperparathyroidism of renal origin: Secondary | ICD-10-CM | POA: Diagnosis not present

## 2018-03-12 DIAGNOSIS — Z992 Dependence on renal dialysis: Secondary | ICD-10-CM | POA: Diagnosis not present

## 2018-03-12 DIAGNOSIS — N186 End stage renal disease: Secondary | ICD-10-CM | POA: Diagnosis not present

## 2018-03-12 DIAGNOSIS — E119 Type 2 diabetes mellitus without complications: Secondary | ICD-10-CM | POA: Diagnosis not present

## 2018-03-14 DIAGNOSIS — Z992 Dependence on renal dialysis: Secondary | ICD-10-CM | POA: Diagnosis not present

## 2018-03-14 DIAGNOSIS — N186 End stage renal disease: Secondary | ICD-10-CM | POA: Diagnosis not present

## 2018-03-14 DIAGNOSIS — N2581 Secondary hyperparathyroidism of renal origin: Secondary | ICD-10-CM | POA: Diagnosis not present

## 2018-03-14 DIAGNOSIS — E119 Type 2 diabetes mellitus without complications: Secondary | ICD-10-CM | POA: Diagnosis not present

## 2018-03-17 DIAGNOSIS — Z992 Dependence on renal dialysis: Secondary | ICD-10-CM | POA: Diagnosis not present

## 2018-03-17 DIAGNOSIS — N2581 Secondary hyperparathyroidism of renal origin: Secondary | ICD-10-CM | POA: Diagnosis not present

## 2018-03-17 DIAGNOSIS — N186 End stage renal disease: Secondary | ICD-10-CM | POA: Diagnosis not present

## 2018-03-17 DIAGNOSIS — E119 Type 2 diabetes mellitus without complications: Secondary | ICD-10-CM | POA: Diagnosis not present

## 2018-03-19 DIAGNOSIS — E119 Type 2 diabetes mellitus without complications: Secondary | ICD-10-CM | POA: Diagnosis not present

## 2018-03-19 DIAGNOSIS — Z992 Dependence on renal dialysis: Secondary | ICD-10-CM | POA: Diagnosis not present

## 2018-03-19 DIAGNOSIS — N2581 Secondary hyperparathyroidism of renal origin: Secondary | ICD-10-CM | POA: Diagnosis not present

## 2018-03-19 DIAGNOSIS — N186 End stage renal disease: Secondary | ICD-10-CM | POA: Diagnosis not present

## 2018-03-21 DIAGNOSIS — N2581 Secondary hyperparathyroidism of renal origin: Secondary | ICD-10-CM | POA: Diagnosis not present

## 2018-03-21 DIAGNOSIS — Z992 Dependence on renal dialysis: Secondary | ICD-10-CM | POA: Diagnosis not present

## 2018-03-21 DIAGNOSIS — N186 End stage renal disease: Secondary | ICD-10-CM | POA: Diagnosis not present

## 2018-03-21 DIAGNOSIS — E119 Type 2 diabetes mellitus without complications: Secondary | ICD-10-CM | POA: Diagnosis not present

## 2018-03-24 DIAGNOSIS — Z992 Dependence on renal dialysis: Secondary | ICD-10-CM | POA: Diagnosis not present

## 2018-03-24 DIAGNOSIS — N186 End stage renal disease: Secondary | ICD-10-CM | POA: Diagnosis not present

## 2018-03-24 DIAGNOSIS — N2581 Secondary hyperparathyroidism of renal origin: Secondary | ICD-10-CM | POA: Diagnosis not present

## 2018-03-24 DIAGNOSIS — E119 Type 2 diabetes mellitus without complications: Secondary | ICD-10-CM | POA: Diagnosis not present

## 2018-03-26 DIAGNOSIS — N186 End stage renal disease: Secondary | ICD-10-CM | POA: Diagnosis not present

## 2018-03-26 DIAGNOSIS — N2581 Secondary hyperparathyroidism of renal origin: Secondary | ICD-10-CM | POA: Diagnosis not present

## 2018-03-26 DIAGNOSIS — Z992 Dependence on renal dialysis: Secondary | ICD-10-CM | POA: Diagnosis not present

## 2018-03-26 DIAGNOSIS — E119 Type 2 diabetes mellitus without complications: Secondary | ICD-10-CM | POA: Diagnosis not present

## 2018-03-28 DIAGNOSIS — E119 Type 2 diabetes mellitus without complications: Secondary | ICD-10-CM | POA: Diagnosis not present

## 2018-03-28 DIAGNOSIS — N186 End stage renal disease: Secondary | ICD-10-CM | POA: Diagnosis not present

## 2018-03-28 DIAGNOSIS — N2581 Secondary hyperparathyroidism of renal origin: Secondary | ICD-10-CM | POA: Diagnosis not present

## 2018-03-28 DIAGNOSIS — Z992 Dependence on renal dialysis: Secondary | ICD-10-CM | POA: Diagnosis not present

## 2018-03-29 DIAGNOSIS — Z992 Dependence on renal dialysis: Secondary | ICD-10-CM | POA: Diagnosis not present

## 2018-03-29 DIAGNOSIS — I129 Hypertensive chronic kidney disease with stage 1 through stage 4 chronic kidney disease, or unspecified chronic kidney disease: Secondary | ICD-10-CM | POA: Diagnosis not present

## 2018-03-29 DIAGNOSIS — N186 End stage renal disease: Secondary | ICD-10-CM | POA: Diagnosis not present

## 2018-03-31 DIAGNOSIS — Z992 Dependence on renal dialysis: Secondary | ICD-10-CM | POA: Diagnosis not present

## 2018-03-31 DIAGNOSIS — N186 End stage renal disease: Secondary | ICD-10-CM | POA: Diagnosis not present

## 2018-03-31 DIAGNOSIS — E119 Type 2 diabetes mellitus without complications: Secondary | ICD-10-CM | POA: Diagnosis not present

## 2018-03-31 DIAGNOSIS — E875 Hyperkalemia: Secondary | ICD-10-CM | POA: Diagnosis not present

## 2018-03-31 DIAGNOSIS — N2581 Secondary hyperparathyroidism of renal origin: Secondary | ICD-10-CM | POA: Diagnosis not present

## 2018-04-02 DIAGNOSIS — N186 End stage renal disease: Secondary | ICD-10-CM | POA: Diagnosis not present

## 2018-04-02 DIAGNOSIS — N2581 Secondary hyperparathyroidism of renal origin: Secondary | ICD-10-CM | POA: Diagnosis not present

## 2018-04-02 DIAGNOSIS — Z992 Dependence on renal dialysis: Secondary | ICD-10-CM | POA: Diagnosis not present

## 2018-04-02 DIAGNOSIS — E875 Hyperkalemia: Secondary | ICD-10-CM | POA: Diagnosis not present

## 2018-04-02 DIAGNOSIS — E119 Type 2 diabetes mellitus without complications: Secondary | ICD-10-CM | POA: Diagnosis not present

## 2018-04-04 DIAGNOSIS — E875 Hyperkalemia: Secondary | ICD-10-CM | POA: Diagnosis not present

## 2018-04-04 DIAGNOSIS — E119 Type 2 diabetes mellitus without complications: Secondary | ICD-10-CM | POA: Diagnosis not present

## 2018-04-04 DIAGNOSIS — N2581 Secondary hyperparathyroidism of renal origin: Secondary | ICD-10-CM | POA: Diagnosis not present

## 2018-04-04 DIAGNOSIS — Z992 Dependence on renal dialysis: Secondary | ICD-10-CM | POA: Diagnosis not present

## 2018-04-04 DIAGNOSIS — N186 End stage renal disease: Secondary | ICD-10-CM | POA: Diagnosis not present

## 2018-04-07 DIAGNOSIS — N2581 Secondary hyperparathyroidism of renal origin: Secondary | ICD-10-CM | POA: Diagnosis not present

## 2018-04-07 DIAGNOSIS — E875 Hyperkalemia: Secondary | ICD-10-CM | POA: Diagnosis not present

## 2018-04-07 DIAGNOSIS — E119 Type 2 diabetes mellitus without complications: Secondary | ICD-10-CM | POA: Diagnosis not present

## 2018-04-07 DIAGNOSIS — Z992 Dependence on renal dialysis: Secondary | ICD-10-CM | POA: Diagnosis not present

## 2018-04-07 DIAGNOSIS — N186 End stage renal disease: Secondary | ICD-10-CM | POA: Diagnosis not present

## 2018-04-09 DIAGNOSIS — Z992 Dependence on renal dialysis: Secondary | ICD-10-CM | POA: Diagnosis not present

## 2018-04-09 DIAGNOSIS — E119 Type 2 diabetes mellitus without complications: Secondary | ICD-10-CM | POA: Diagnosis not present

## 2018-04-09 DIAGNOSIS — E875 Hyperkalemia: Secondary | ICD-10-CM | POA: Diagnosis not present

## 2018-04-09 DIAGNOSIS — N186 End stage renal disease: Secondary | ICD-10-CM | POA: Diagnosis not present

## 2018-04-09 DIAGNOSIS — N2581 Secondary hyperparathyroidism of renal origin: Secondary | ICD-10-CM | POA: Diagnosis not present

## 2018-04-11 DIAGNOSIS — E119 Type 2 diabetes mellitus without complications: Secondary | ICD-10-CM | POA: Diagnosis not present

## 2018-04-11 DIAGNOSIS — E875 Hyperkalemia: Secondary | ICD-10-CM | POA: Diagnosis not present

## 2018-04-11 DIAGNOSIS — Z992 Dependence on renal dialysis: Secondary | ICD-10-CM | POA: Diagnosis not present

## 2018-04-11 DIAGNOSIS — N2581 Secondary hyperparathyroidism of renal origin: Secondary | ICD-10-CM | POA: Diagnosis not present

## 2018-04-11 DIAGNOSIS — N186 End stage renal disease: Secondary | ICD-10-CM | POA: Diagnosis not present

## 2018-04-14 DIAGNOSIS — N2581 Secondary hyperparathyroidism of renal origin: Secondary | ICD-10-CM | POA: Diagnosis not present

## 2018-04-14 DIAGNOSIS — N186 End stage renal disease: Secondary | ICD-10-CM | POA: Diagnosis not present

## 2018-04-14 DIAGNOSIS — E875 Hyperkalemia: Secondary | ICD-10-CM | POA: Diagnosis not present

## 2018-04-14 DIAGNOSIS — Z992 Dependence on renal dialysis: Secondary | ICD-10-CM | POA: Diagnosis not present

## 2018-04-14 DIAGNOSIS — E119 Type 2 diabetes mellitus without complications: Secondary | ICD-10-CM | POA: Diagnosis not present

## 2018-04-15 ENCOUNTER — Telehealth: Payer: Self-pay | Admitting: Hematology and Oncology

## 2018-04-15 ENCOUNTER — Telehealth: Payer: Self-pay

## 2018-04-15 NOTE — Telephone Encounter (Signed)
Called and given below message. Scheduling message sent. 

## 2018-04-15 NOTE — Telephone Encounter (Signed)
R./s appt per 3/18 sch message- pt aware of apt date and time

## 2018-04-15 NOTE — Telephone Encounter (Signed)
-----   Message from Heath Lark, MD sent at 04/15/2018  8:32 AM EDT ----- Regarding: move appt 1 month out Pls move his appt out 1 month

## 2018-04-16 DIAGNOSIS — E875 Hyperkalemia: Secondary | ICD-10-CM | POA: Diagnosis not present

## 2018-04-16 DIAGNOSIS — E119 Type 2 diabetes mellitus without complications: Secondary | ICD-10-CM | POA: Diagnosis not present

## 2018-04-16 DIAGNOSIS — Z992 Dependence on renal dialysis: Secondary | ICD-10-CM | POA: Diagnosis not present

## 2018-04-16 DIAGNOSIS — N2581 Secondary hyperparathyroidism of renal origin: Secondary | ICD-10-CM | POA: Diagnosis not present

## 2018-04-16 DIAGNOSIS — N186 End stage renal disease: Secondary | ICD-10-CM | POA: Diagnosis not present

## 2018-04-18 DIAGNOSIS — N2581 Secondary hyperparathyroidism of renal origin: Secondary | ICD-10-CM | POA: Diagnosis not present

## 2018-04-18 DIAGNOSIS — N186 End stage renal disease: Secondary | ICD-10-CM | POA: Diagnosis not present

## 2018-04-18 DIAGNOSIS — E119 Type 2 diabetes mellitus without complications: Secondary | ICD-10-CM | POA: Diagnosis not present

## 2018-04-18 DIAGNOSIS — E875 Hyperkalemia: Secondary | ICD-10-CM | POA: Diagnosis not present

## 2018-04-18 DIAGNOSIS — Z992 Dependence on renal dialysis: Secondary | ICD-10-CM | POA: Diagnosis not present

## 2018-04-21 DIAGNOSIS — N186 End stage renal disease: Secondary | ICD-10-CM | POA: Diagnosis not present

## 2018-04-21 DIAGNOSIS — N2581 Secondary hyperparathyroidism of renal origin: Secondary | ICD-10-CM | POA: Diagnosis not present

## 2018-04-21 DIAGNOSIS — E875 Hyperkalemia: Secondary | ICD-10-CM | POA: Diagnosis not present

## 2018-04-21 DIAGNOSIS — Z992 Dependence on renal dialysis: Secondary | ICD-10-CM | POA: Diagnosis not present

## 2018-04-21 DIAGNOSIS — E119 Type 2 diabetes mellitus without complications: Secondary | ICD-10-CM | POA: Diagnosis not present

## 2018-04-23 DIAGNOSIS — N2581 Secondary hyperparathyroidism of renal origin: Secondary | ICD-10-CM | POA: Diagnosis not present

## 2018-04-23 DIAGNOSIS — E875 Hyperkalemia: Secondary | ICD-10-CM | POA: Diagnosis not present

## 2018-04-23 DIAGNOSIS — N186 End stage renal disease: Secondary | ICD-10-CM | POA: Diagnosis not present

## 2018-04-23 DIAGNOSIS — E119 Type 2 diabetes mellitus without complications: Secondary | ICD-10-CM | POA: Diagnosis not present

## 2018-04-23 DIAGNOSIS — Z992 Dependence on renal dialysis: Secondary | ICD-10-CM | POA: Diagnosis not present

## 2018-04-25 DIAGNOSIS — N2581 Secondary hyperparathyroidism of renal origin: Secondary | ICD-10-CM | POA: Diagnosis not present

## 2018-04-25 DIAGNOSIS — E875 Hyperkalemia: Secondary | ICD-10-CM | POA: Diagnosis not present

## 2018-04-25 DIAGNOSIS — Z992 Dependence on renal dialysis: Secondary | ICD-10-CM | POA: Diagnosis not present

## 2018-04-25 DIAGNOSIS — N186 End stage renal disease: Secondary | ICD-10-CM | POA: Diagnosis not present

## 2018-04-25 DIAGNOSIS — E119 Type 2 diabetes mellitus without complications: Secondary | ICD-10-CM | POA: Diagnosis not present

## 2018-04-27 ENCOUNTER — Ambulatory Visit: Payer: PRIVATE HEALTH INSURANCE | Admitting: Hematology and Oncology

## 2018-04-27 ENCOUNTER — Other Ambulatory Visit: Payer: PRIVATE HEALTH INSURANCE

## 2018-04-28 DIAGNOSIS — N2581 Secondary hyperparathyroidism of renal origin: Secondary | ICD-10-CM | POA: Diagnosis not present

## 2018-04-28 DIAGNOSIS — E119 Type 2 diabetes mellitus without complications: Secondary | ICD-10-CM | POA: Diagnosis not present

## 2018-04-28 DIAGNOSIS — N186 End stage renal disease: Secondary | ICD-10-CM | POA: Diagnosis not present

## 2018-04-28 DIAGNOSIS — E875 Hyperkalemia: Secondary | ICD-10-CM | POA: Diagnosis not present

## 2018-04-28 DIAGNOSIS — Z992 Dependence on renal dialysis: Secondary | ICD-10-CM | POA: Diagnosis not present

## 2018-04-29 DIAGNOSIS — Z992 Dependence on renal dialysis: Secondary | ICD-10-CM | POA: Diagnosis not present

## 2018-04-29 DIAGNOSIS — N186 End stage renal disease: Secondary | ICD-10-CM | POA: Diagnosis not present

## 2018-04-29 DIAGNOSIS — I129 Hypertensive chronic kidney disease with stage 1 through stage 4 chronic kidney disease, or unspecified chronic kidney disease: Secondary | ICD-10-CM | POA: Diagnosis not present

## 2018-04-30 DIAGNOSIS — N186 End stage renal disease: Secondary | ICD-10-CM | POA: Diagnosis not present

## 2018-04-30 DIAGNOSIS — N2581 Secondary hyperparathyroidism of renal origin: Secondary | ICD-10-CM | POA: Diagnosis not present

## 2018-04-30 DIAGNOSIS — E119 Type 2 diabetes mellitus without complications: Secondary | ICD-10-CM | POA: Diagnosis not present

## 2018-04-30 DIAGNOSIS — Z992 Dependence on renal dialysis: Secondary | ICD-10-CM | POA: Diagnosis not present

## 2018-05-02 DIAGNOSIS — N186 End stage renal disease: Secondary | ICD-10-CM | POA: Diagnosis not present

## 2018-05-02 DIAGNOSIS — E119 Type 2 diabetes mellitus without complications: Secondary | ICD-10-CM | POA: Diagnosis not present

## 2018-05-02 DIAGNOSIS — N2581 Secondary hyperparathyroidism of renal origin: Secondary | ICD-10-CM | POA: Diagnosis not present

## 2018-05-02 DIAGNOSIS — Z992 Dependence on renal dialysis: Secondary | ICD-10-CM | POA: Diagnosis not present

## 2018-05-04 DIAGNOSIS — E1351 Other specified diabetes mellitus with diabetic peripheral angiopathy without gangrene: Secondary | ICD-10-CM | POA: Diagnosis not present

## 2018-05-04 DIAGNOSIS — L84 Corns and callosities: Secondary | ICD-10-CM | POA: Diagnosis not present

## 2018-05-04 DIAGNOSIS — L602 Onychogryphosis: Secondary | ICD-10-CM | POA: Diagnosis not present

## 2018-05-05 DIAGNOSIS — Z992 Dependence on renal dialysis: Secondary | ICD-10-CM | POA: Diagnosis not present

## 2018-05-05 DIAGNOSIS — N2581 Secondary hyperparathyroidism of renal origin: Secondary | ICD-10-CM | POA: Diagnosis not present

## 2018-05-05 DIAGNOSIS — E119 Type 2 diabetes mellitus without complications: Secondary | ICD-10-CM | POA: Diagnosis not present

## 2018-05-05 DIAGNOSIS — N186 End stage renal disease: Secondary | ICD-10-CM | POA: Diagnosis not present

## 2018-05-07 DIAGNOSIS — E119 Type 2 diabetes mellitus without complications: Secondary | ICD-10-CM | POA: Diagnosis not present

## 2018-05-07 DIAGNOSIS — N2581 Secondary hyperparathyroidism of renal origin: Secondary | ICD-10-CM | POA: Diagnosis not present

## 2018-05-07 DIAGNOSIS — N186 End stage renal disease: Secondary | ICD-10-CM | POA: Diagnosis not present

## 2018-05-07 DIAGNOSIS — Z992 Dependence on renal dialysis: Secondary | ICD-10-CM | POA: Diagnosis not present

## 2018-05-09 DIAGNOSIS — E119 Type 2 diabetes mellitus without complications: Secondary | ICD-10-CM | POA: Diagnosis not present

## 2018-05-09 DIAGNOSIS — N186 End stage renal disease: Secondary | ICD-10-CM | POA: Diagnosis not present

## 2018-05-09 DIAGNOSIS — N2581 Secondary hyperparathyroidism of renal origin: Secondary | ICD-10-CM | POA: Diagnosis not present

## 2018-05-09 DIAGNOSIS — Z992 Dependence on renal dialysis: Secondary | ICD-10-CM | POA: Diagnosis not present

## 2018-05-11 ENCOUNTER — Other Ambulatory Visit: Payer: Self-pay | Admitting: Nurse Practitioner

## 2018-05-12 ENCOUNTER — Other Ambulatory Visit: Payer: Self-pay | Admitting: Nurse Practitioner

## 2018-05-12 DIAGNOSIS — N2581 Secondary hyperparathyroidism of renal origin: Secondary | ICD-10-CM | POA: Diagnosis not present

## 2018-05-12 DIAGNOSIS — Z992 Dependence on renal dialysis: Secondary | ICD-10-CM | POA: Diagnosis not present

## 2018-05-12 DIAGNOSIS — E119 Type 2 diabetes mellitus without complications: Secondary | ICD-10-CM | POA: Diagnosis not present

## 2018-05-12 DIAGNOSIS — N186 End stage renal disease: Secondary | ICD-10-CM | POA: Diagnosis not present

## 2018-05-13 ENCOUNTER — Telehealth: Payer: Self-pay | Admitting: Hematology and Oncology

## 2018-05-13 NOTE — Telephone Encounter (Signed)
R/s appt per 4/14 sch message - spoke with wife - she is aware of appt date and time

## 2018-05-14 DIAGNOSIS — E119 Type 2 diabetes mellitus without complications: Secondary | ICD-10-CM | POA: Diagnosis not present

## 2018-05-14 DIAGNOSIS — Z992 Dependence on renal dialysis: Secondary | ICD-10-CM | POA: Diagnosis not present

## 2018-05-14 DIAGNOSIS — N2581 Secondary hyperparathyroidism of renal origin: Secondary | ICD-10-CM | POA: Diagnosis not present

## 2018-05-14 DIAGNOSIS — N186 End stage renal disease: Secondary | ICD-10-CM | POA: Diagnosis not present

## 2018-05-16 DIAGNOSIS — E119 Type 2 diabetes mellitus without complications: Secondary | ICD-10-CM | POA: Diagnosis not present

## 2018-05-16 DIAGNOSIS — N2581 Secondary hyperparathyroidism of renal origin: Secondary | ICD-10-CM | POA: Diagnosis not present

## 2018-05-16 DIAGNOSIS — Z992 Dependence on renal dialysis: Secondary | ICD-10-CM | POA: Diagnosis not present

## 2018-05-16 DIAGNOSIS — N186 End stage renal disease: Secondary | ICD-10-CM | POA: Diagnosis not present

## 2018-05-19 DIAGNOSIS — N2581 Secondary hyperparathyroidism of renal origin: Secondary | ICD-10-CM | POA: Diagnosis not present

## 2018-05-19 DIAGNOSIS — Z992 Dependence on renal dialysis: Secondary | ICD-10-CM | POA: Diagnosis not present

## 2018-05-19 DIAGNOSIS — E119 Type 2 diabetes mellitus without complications: Secondary | ICD-10-CM | POA: Diagnosis not present

## 2018-05-19 DIAGNOSIS — N186 End stage renal disease: Secondary | ICD-10-CM | POA: Diagnosis not present

## 2018-05-21 DIAGNOSIS — N2581 Secondary hyperparathyroidism of renal origin: Secondary | ICD-10-CM | POA: Diagnosis not present

## 2018-05-21 DIAGNOSIS — E119 Type 2 diabetes mellitus without complications: Secondary | ICD-10-CM | POA: Diagnosis not present

## 2018-05-21 DIAGNOSIS — Z992 Dependence on renal dialysis: Secondary | ICD-10-CM | POA: Diagnosis not present

## 2018-05-21 DIAGNOSIS — N186 End stage renal disease: Secondary | ICD-10-CM | POA: Diagnosis not present

## 2018-05-23 DIAGNOSIS — Z992 Dependence on renal dialysis: Secondary | ICD-10-CM | POA: Diagnosis not present

## 2018-05-23 DIAGNOSIS — N2581 Secondary hyperparathyroidism of renal origin: Secondary | ICD-10-CM | POA: Diagnosis not present

## 2018-05-23 DIAGNOSIS — N186 End stage renal disease: Secondary | ICD-10-CM | POA: Diagnosis not present

## 2018-05-23 DIAGNOSIS — E119 Type 2 diabetes mellitus without complications: Secondary | ICD-10-CM | POA: Diagnosis not present

## 2018-05-25 ENCOUNTER — Other Ambulatory Visit: Payer: PRIVATE HEALTH INSURANCE

## 2018-05-25 ENCOUNTER — Ambulatory Visit: Payer: PRIVATE HEALTH INSURANCE | Admitting: Hematology and Oncology

## 2018-05-26 DIAGNOSIS — N186 End stage renal disease: Secondary | ICD-10-CM | POA: Diagnosis not present

## 2018-05-26 DIAGNOSIS — Z992 Dependence on renal dialysis: Secondary | ICD-10-CM | POA: Diagnosis not present

## 2018-05-26 DIAGNOSIS — N2581 Secondary hyperparathyroidism of renal origin: Secondary | ICD-10-CM | POA: Diagnosis not present

## 2018-05-26 DIAGNOSIS — E119 Type 2 diabetes mellitus without complications: Secondary | ICD-10-CM | POA: Diagnosis not present

## 2018-05-28 DIAGNOSIS — E119 Type 2 diabetes mellitus without complications: Secondary | ICD-10-CM | POA: Diagnosis not present

## 2018-05-28 DIAGNOSIS — N2581 Secondary hyperparathyroidism of renal origin: Secondary | ICD-10-CM | POA: Diagnosis not present

## 2018-05-28 DIAGNOSIS — N186 End stage renal disease: Secondary | ICD-10-CM | POA: Diagnosis not present

## 2018-05-28 DIAGNOSIS — Z992 Dependence on renal dialysis: Secondary | ICD-10-CM | POA: Diagnosis not present

## 2018-05-29 DIAGNOSIS — I129 Hypertensive chronic kidney disease with stage 1 through stage 4 chronic kidney disease, or unspecified chronic kidney disease: Secondary | ICD-10-CM | POA: Diagnosis not present

## 2018-05-29 DIAGNOSIS — N186 End stage renal disease: Secondary | ICD-10-CM | POA: Diagnosis not present

## 2018-05-29 DIAGNOSIS — Z992 Dependence on renal dialysis: Secondary | ICD-10-CM | POA: Diagnosis not present

## 2018-05-30 DIAGNOSIS — Z992 Dependence on renal dialysis: Secondary | ICD-10-CM | POA: Diagnosis not present

## 2018-05-30 DIAGNOSIS — N186 End stage renal disease: Secondary | ICD-10-CM | POA: Diagnosis not present

## 2018-05-30 DIAGNOSIS — N2581 Secondary hyperparathyroidism of renal origin: Secondary | ICD-10-CM | POA: Diagnosis not present

## 2018-05-30 DIAGNOSIS — E119 Type 2 diabetes mellitus without complications: Secondary | ICD-10-CM | POA: Diagnosis not present

## 2018-06-02 DIAGNOSIS — Z992 Dependence on renal dialysis: Secondary | ICD-10-CM | POA: Diagnosis not present

## 2018-06-02 DIAGNOSIS — N2581 Secondary hyperparathyroidism of renal origin: Secondary | ICD-10-CM | POA: Diagnosis not present

## 2018-06-02 DIAGNOSIS — E119 Type 2 diabetes mellitus without complications: Secondary | ICD-10-CM | POA: Diagnosis not present

## 2018-06-02 DIAGNOSIS — N186 End stage renal disease: Secondary | ICD-10-CM | POA: Diagnosis not present

## 2018-06-04 DIAGNOSIS — N186 End stage renal disease: Secondary | ICD-10-CM | POA: Diagnosis not present

## 2018-06-04 DIAGNOSIS — N2581 Secondary hyperparathyroidism of renal origin: Secondary | ICD-10-CM | POA: Diagnosis not present

## 2018-06-04 DIAGNOSIS — E119 Type 2 diabetes mellitus without complications: Secondary | ICD-10-CM | POA: Diagnosis not present

## 2018-06-04 DIAGNOSIS — Z992 Dependence on renal dialysis: Secondary | ICD-10-CM | POA: Diagnosis not present

## 2018-06-06 DIAGNOSIS — N186 End stage renal disease: Secondary | ICD-10-CM | POA: Diagnosis not present

## 2018-06-06 DIAGNOSIS — N2581 Secondary hyperparathyroidism of renal origin: Secondary | ICD-10-CM | POA: Diagnosis not present

## 2018-06-06 DIAGNOSIS — E119 Type 2 diabetes mellitus without complications: Secondary | ICD-10-CM | POA: Diagnosis not present

## 2018-06-06 DIAGNOSIS — Z992 Dependence on renal dialysis: Secondary | ICD-10-CM | POA: Diagnosis not present

## 2018-06-09 DIAGNOSIS — N2581 Secondary hyperparathyroidism of renal origin: Secondary | ICD-10-CM | POA: Diagnosis not present

## 2018-06-09 DIAGNOSIS — N186 End stage renal disease: Secondary | ICD-10-CM | POA: Diagnosis not present

## 2018-06-09 DIAGNOSIS — Z992 Dependence on renal dialysis: Secondary | ICD-10-CM | POA: Diagnosis not present

## 2018-06-09 DIAGNOSIS — E119 Type 2 diabetes mellitus without complications: Secondary | ICD-10-CM | POA: Diagnosis not present

## 2018-06-11 DIAGNOSIS — N2581 Secondary hyperparathyroidism of renal origin: Secondary | ICD-10-CM | POA: Diagnosis not present

## 2018-06-11 DIAGNOSIS — E119 Type 2 diabetes mellitus without complications: Secondary | ICD-10-CM | POA: Diagnosis not present

## 2018-06-11 DIAGNOSIS — N186 End stage renal disease: Secondary | ICD-10-CM | POA: Diagnosis not present

## 2018-06-11 DIAGNOSIS — Z992 Dependence on renal dialysis: Secondary | ICD-10-CM | POA: Diagnosis not present

## 2018-06-13 DIAGNOSIS — Z992 Dependence on renal dialysis: Secondary | ICD-10-CM | POA: Diagnosis not present

## 2018-06-13 DIAGNOSIS — N186 End stage renal disease: Secondary | ICD-10-CM | POA: Diagnosis not present

## 2018-06-13 DIAGNOSIS — N2581 Secondary hyperparathyroidism of renal origin: Secondary | ICD-10-CM | POA: Diagnosis not present

## 2018-06-13 DIAGNOSIS — E119 Type 2 diabetes mellitus without complications: Secondary | ICD-10-CM | POA: Diagnosis not present

## 2018-06-15 ENCOUNTER — Telehealth: Payer: Self-pay

## 2018-06-15 ENCOUNTER — Ambulatory Visit: Payer: Medicare Other | Admitting: Nurse Practitioner

## 2018-06-15 NOTE — Telephone Encounter (Signed)
Left pt v/m to call office to reschedule his appt. YRL,RMA

## 2018-06-16 DIAGNOSIS — Z992 Dependence on renal dialysis: Secondary | ICD-10-CM | POA: Diagnosis not present

## 2018-06-16 DIAGNOSIS — N186 End stage renal disease: Secondary | ICD-10-CM | POA: Diagnosis not present

## 2018-06-16 DIAGNOSIS — E119 Type 2 diabetes mellitus without complications: Secondary | ICD-10-CM | POA: Diagnosis not present

## 2018-06-16 DIAGNOSIS — N2581 Secondary hyperparathyroidism of renal origin: Secondary | ICD-10-CM | POA: Diagnosis not present

## 2018-06-18 DIAGNOSIS — Z992 Dependence on renal dialysis: Secondary | ICD-10-CM | POA: Diagnosis not present

## 2018-06-18 DIAGNOSIS — E119 Type 2 diabetes mellitus without complications: Secondary | ICD-10-CM | POA: Diagnosis not present

## 2018-06-18 DIAGNOSIS — N2581 Secondary hyperparathyroidism of renal origin: Secondary | ICD-10-CM | POA: Diagnosis not present

## 2018-06-18 DIAGNOSIS — N186 End stage renal disease: Secondary | ICD-10-CM | POA: Diagnosis not present

## 2018-06-20 DIAGNOSIS — N2581 Secondary hyperparathyroidism of renal origin: Secondary | ICD-10-CM | POA: Diagnosis not present

## 2018-06-20 DIAGNOSIS — E119 Type 2 diabetes mellitus without complications: Secondary | ICD-10-CM | POA: Diagnosis not present

## 2018-06-20 DIAGNOSIS — N186 End stage renal disease: Secondary | ICD-10-CM | POA: Diagnosis not present

## 2018-06-20 DIAGNOSIS — Z992 Dependence on renal dialysis: Secondary | ICD-10-CM | POA: Diagnosis not present

## 2018-06-23 DIAGNOSIS — E119 Type 2 diabetes mellitus without complications: Secondary | ICD-10-CM | POA: Diagnosis not present

## 2018-06-23 DIAGNOSIS — N186 End stage renal disease: Secondary | ICD-10-CM | POA: Diagnosis not present

## 2018-06-23 DIAGNOSIS — N2581 Secondary hyperparathyroidism of renal origin: Secondary | ICD-10-CM | POA: Diagnosis not present

## 2018-06-23 DIAGNOSIS — Z992 Dependence on renal dialysis: Secondary | ICD-10-CM | POA: Diagnosis not present

## 2018-06-25 DIAGNOSIS — N186 End stage renal disease: Secondary | ICD-10-CM | POA: Diagnosis not present

## 2018-06-25 DIAGNOSIS — N2581 Secondary hyperparathyroidism of renal origin: Secondary | ICD-10-CM | POA: Diagnosis not present

## 2018-06-25 DIAGNOSIS — E119 Type 2 diabetes mellitus without complications: Secondary | ICD-10-CM | POA: Diagnosis not present

## 2018-06-25 DIAGNOSIS — Z992 Dependence on renal dialysis: Secondary | ICD-10-CM | POA: Diagnosis not present

## 2018-06-27 DIAGNOSIS — N186 End stage renal disease: Secondary | ICD-10-CM | POA: Diagnosis not present

## 2018-06-27 DIAGNOSIS — Z992 Dependence on renal dialysis: Secondary | ICD-10-CM | POA: Diagnosis not present

## 2018-06-27 DIAGNOSIS — E119 Type 2 diabetes mellitus without complications: Secondary | ICD-10-CM | POA: Diagnosis not present

## 2018-06-27 DIAGNOSIS — N2581 Secondary hyperparathyroidism of renal origin: Secondary | ICD-10-CM | POA: Diagnosis not present

## 2018-06-29 DIAGNOSIS — I129 Hypertensive chronic kidney disease with stage 1 through stage 4 chronic kidney disease, or unspecified chronic kidney disease: Secondary | ICD-10-CM | POA: Diagnosis not present

## 2018-06-29 DIAGNOSIS — N186 End stage renal disease: Secondary | ICD-10-CM | POA: Diagnosis not present

## 2018-06-29 DIAGNOSIS — Z992 Dependence on renal dialysis: Secondary | ICD-10-CM | POA: Diagnosis not present

## 2018-06-30 DIAGNOSIS — N186 End stage renal disease: Secondary | ICD-10-CM | POA: Diagnosis not present

## 2018-06-30 DIAGNOSIS — N2581 Secondary hyperparathyroidism of renal origin: Secondary | ICD-10-CM | POA: Diagnosis not present

## 2018-06-30 DIAGNOSIS — E119 Type 2 diabetes mellitus without complications: Secondary | ICD-10-CM | POA: Diagnosis not present

## 2018-06-30 DIAGNOSIS — Z992 Dependence on renal dialysis: Secondary | ICD-10-CM | POA: Diagnosis not present

## 2018-06-30 DIAGNOSIS — D631 Anemia in chronic kidney disease: Secondary | ICD-10-CM | POA: Diagnosis not present

## 2018-06-30 DIAGNOSIS — D509 Iron deficiency anemia, unspecified: Secondary | ICD-10-CM | POA: Diagnosis not present

## 2018-07-02 DIAGNOSIS — N2581 Secondary hyperparathyroidism of renal origin: Secondary | ICD-10-CM | POA: Diagnosis not present

## 2018-07-02 DIAGNOSIS — E119 Type 2 diabetes mellitus without complications: Secondary | ICD-10-CM | POA: Diagnosis not present

## 2018-07-02 DIAGNOSIS — Z992 Dependence on renal dialysis: Secondary | ICD-10-CM | POA: Diagnosis not present

## 2018-07-02 DIAGNOSIS — D509 Iron deficiency anemia, unspecified: Secondary | ICD-10-CM | POA: Diagnosis not present

## 2018-07-02 DIAGNOSIS — N186 End stage renal disease: Secondary | ICD-10-CM | POA: Diagnosis not present

## 2018-07-02 DIAGNOSIS — D631 Anemia in chronic kidney disease: Secondary | ICD-10-CM | POA: Diagnosis not present

## 2018-07-04 DIAGNOSIS — N186 End stage renal disease: Secondary | ICD-10-CM | POA: Diagnosis not present

## 2018-07-04 DIAGNOSIS — N2581 Secondary hyperparathyroidism of renal origin: Secondary | ICD-10-CM | POA: Diagnosis not present

## 2018-07-04 DIAGNOSIS — Z992 Dependence on renal dialysis: Secondary | ICD-10-CM | POA: Diagnosis not present

## 2018-07-04 DIAGNOSIS — E119 Type 2 diabetes mellitus without complications: Secondary | ICD-10-CM | POA: Diagnosis not present

## 2018-07-04 DIAGNOSIS — D509 Iron deficiency anemia, unspecified: Secondary | ICD-10-CM | POA: Diagnosis not present

## 2018-07-04 DIAGNOSIS — D631 Anemia in chronic kidney disease: Secondary | ICD-10-CM | POA: Diagnosis not present

## 2018-07-07 DIAGNOSIS — N2581 Secondary hyperparathyroidism of renal origin: Secondary | ICD-10-CM | POA: Diagnosis not present

## 2018-07-07 DIAGNOSIS — D631 Anemia in chronic kidney disease: Secondary | ICD-10-CM | POA: Diagnosis not present

## 2018-07-07 DIAGNOSIS — Z992 Dependence on renal dialysis: Secondary | ICD-10-CM | POA: Diagnosis not present

## 2018-07-07 DIAGNOSIS — D509 Iron deficiency anemia, unspecified: Secondary | ICD-10-CM | POA: Diagnosis not present

## 2018-07-07 DIAGNOSIS — E119 Type 2 diabetes mellitus without complications: Secondary | ICD-10-CM | POA: Diagnosis not present

## 2018-07-07 DIAGNOSIS — N186 End stage renal disease: Secondary | ICD-10-CM | POA: Diagnosis not present

## 2018-07-08 DIAGNOSIS — H2513 Age-related nuclear cataract, bilateral: Secondary | ICD-10-CM | POA: Diagnosis not present

## 2018-07-09 DIAGNOSIS — E119 Type 2 diabetes mellitus without complications: Secondary | ICD-10-CM | POA: Diagnosis not present

## 2018-07-09 DIAGNOSIS — D509 Iron deficiency anemia, unspecified: Secondary | ICD-10-CM | POA: Diagnosis not present

## 2018-07-09 DIAGNOSIS — Z992 Dependence on renal dialysis: Secondary | ICD-10-CM | POA: Diagnosis not present

## 2018-07-09 DIAGNOSIS — N2581 Secondary hyperparathyroidism of renal origin: Secondary | ICD-10-CM | POA: Diagnosis not present

## 2018-07-09 DIAGNOSIS — N186 End stage renal disease: Secondary | ICD-10-CM | POA: Diagnosis not present

## 2018-07-09 DIAGNOSIS — D631 Anemia in chronic kidney disease: Secondary | ICD-10-CM | POA: Diagnosis not present

## 2018-07-10 DIAGNOSIS — S42201A Unspecified fracture of upper end of right humerus, initial encounter for closed fracture: Secondary | ICD-10-CM | POA: Diagnosis not present

## 2018-07-11 ENCOUNTER — Other Ambulatory Visit: Payer: Self-pay

## 2018-07-11 ENCOUNTER — Encounter (HOSPITAL_COMMUNITY): Payer: Self-pay

## 2018-07-11 ENCOUNTER — Emergency Department (HOSPITAL_COMMUNITY): Payer: Medicare Other

## 2018-07-11 ENCOUNTER — Emergency Department (HOSPITAL_COMMUNITY)
Admission: EM | Admit: 2018-07-11 | Discharge: 2018-07-11 | Disposition: A | Discharge status: Home / Self Care | Payer: Medicare Other | Attending: Emergency Medicine | Admitting: Emergency Medicine

## 2018-07-11 DIAGNOSIS — W1789XA Other fall from one level to another, initial encounter: Secondary | ICD-10-CM | POA: Diagnosis not present

## 2018-07-11 DIAGNOSIS — I69353 Hemiplegia and hemiparesis following cerebral infarction affecting right non-dominant side: Secondary | ICD-10-CM | POA: Insufficient documentation

## 2018-07-11 DIAGNOSIS — Z7982 Long term (current) use of aspirin: Secondary | ICD-10-CM | POA: Diagnosis not present

## 2018-07-11 DIAGNOSIS — S42291A Other displaced fracture of upper end of right humerus, initial encounter for closed fracture: Secondary | ICD-10-CM | POA: Diagnosis not present

## 2018-07-11 DIAGNOSIS — Y999 Unspecified external cause status: Secondary | ICD-10-CM | POA: Diagnosis not present

## 2018-07-11 DIAGNOSIS — Y92009 Unspecified place in unspecified non-institutional (private) residence as the place of occurrence of the external cause: Secondary | ICD-10-CM | POA: Diagnosis not present

## 2018-07-11 DIAGNOSIS — W19XXXA Unspecified fall, initial encounter: Secondary | ICD-10-CM

## 2018-07-11 DIAGNOSIS — S42201A Unspecified fracture of upper end of right humerus, initial encounter for closed fracture: Secondary | ICD-10-CM | POA: Diagnosis not present

## 2018-07-11 DIAGNOSIS — N186 End stage renal disease: Secondary | ICD-10-CM | POA: Diagnosis not present

## 2018-07-11 DIAGNOSIS — Z992 Dependence on renal dialysis: Secondary | ICD-10-CM | POA: Diagnosis not present

## 2018-07-11 DIAGNOSIS — C9111 Chronic lymphocytic leukemia of B-cell type in remission: Secondary | ICD-10-CM | POA: Diagnosis not present

## 2018-07-11 DIAGNOSIS — S4991XA Unspecified injury of right shoulder and upper arm, initial encounter: Secondary | ICD-10-CM | POA: Diagnosis present

## 2018-07-11 DIAGNOSIS — Y9389 Activity, other specified: Secondary | ICD-10-CM | POA: Diagnosis not present

## 2018-07-11 DIAGNOSIS — Z79899 Other long term (current) drug therapy: Secondary | ICD-10-CM | POA: Insufficient documentation

## 2018-07-11 DIAGNOSIS — S42294A Other nondisplaced fracture of upper end of right humerus, initial encounter for closed fracture: Secondary | ICD-10-CM | POA: Insufficient documentation

## 2018-07-11 DIAGNOSIS — I120 Hypertensive chronic kidney disease with stage 5 chronic kidney disease or end stage renal disease: Secondary | ICD-10-CM | POA: Insufficient documentation

## 2018-07-11 MED ORDER — ACETAMINOPHEN 325 MG PO TABS
650.0000 mg | ORAL_TABLET | Freq: Once | ORAL | Status: AC
  Administered 2018-07-11: 650 mg via ORAL
  Filled 2018-07-11: qty 2

## 2018-07-11 MED ORDER — OXYCODONE HCL 5 MG PO TABS: 5.0000 mg | ORAL_TABLET | ORAL | 0 refills | Status: AC | PRN

## 2018-07-11 MED ORDER — OXYCODONE-ACETAMINOPHEN 5-325 MG PO TABS
1.0000 | ORAL_TABLET | Freq: Once | ORAL | Status: AC
  Administered 2018-07-11: 1 via ORAL
  Filled 2018-07-11: qty 1

## 2018-07-11 NOTE — ED Notes (Signed)
Patient transported to X-ray 

## 2018-07-11 NOTE — ED Triage Notes (Signed)
Pt reports a mechanical fall onto his R side last night. Endorsing pain in his R shoulder and down his R arm. R arm is restricted d/t hx of stroke. Denies LOC or head injury from the fall.

## 2018-07-11 NOTE — ED Provider Notes (Signed)
[facility] DEPT Provider Note   CSN: [id number] Arrival date & time: 07/11/18  0636    History   Chief Complaint Chief Complaint  Patient presents with  . Arm Pain    HPI Javier Jenkins is a 68 y.o. male with h/o stroke with right sided deficits, B-cell lymphoma in remission, ESRD on HD, hypothyroidism presents to the ER for evaluation of right upper extremity pain onset last night after a mechanical fall.  He was walking out of the backdoor of his home when he tripped and fell down onto the ground landing on his right arm.  His right arm was tucked underneath him.  States that his right arm is weak from his stroke and he cannot really move it, he was not able to brace himself.  Due to his right-sided weakness he was unable to get up on his own but his wife was able to help him get up.  He has been ambulatory since.  He had sudden pain in his right shoulder and shoulder blade, he took some Tylenol and try to sleep it off but woke up this morning with worsening pain.  The pain is mild, persistent into the right shoulder and shoulder blade.  It is worse with movement and palpation.  No paresthesias, changes in sensation in the right upper extremity.  Adamantly denies any other injuries after the fall including head trauma, headache, neck pain, vision changes or loss, chest wall pain, abdominal pain, back pain, hip pain.  Daily aspirin only. No modifying factors.      HPI  Past Medical History:  Diagnosis Date  . Chronic kidney disease    stage 5  . Constipation   . Diabetes mellitus without complication ([place])    not on medications  . 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 Hypertension   . Hypothyroidism   . Non-Hodgkin lymphoma of intra-abdominal lymph nodes ([place]) 06/07/2015  . 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 Stroke [facility])    right side paralysis    Patient Active Problem List   Diagnosis Date Noted  .  Nodule of lower lobe of right lung 03/31/2017  . Kidney cysts 03/31/2017  . Contact dermatitis, allergic 02/26/2016  . Dermatitis due to unknown cause 09/18/2015  . Port catheter in place 08/25/2015  . Mucositis due to chemotherapy 07/14/2015  . 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 Constipation due to opioid therapy 06/07/2015  . Chronic nausea 06/07/2015  . Abdominal pain 06/07/2015  . Hemiparesis affecting right side as late effect of stroke ([place]) 06/07/2015  . Adult polycystic kidney disease 06/07/2015  . Abdominal bloating   . ESRD needing dialysis ([place]) 05/31/2015  . ESRD (end stage renal disease) ([place]) 05/31/2015  . Seizure prophylaxis 05/31/2015  . Hypothyroidism 05/31/2015  . Syncope 04/13/2015  . End stage renal failure on dialysis ([place]) 04/13/2015  . Anemia in chronic kidney disease 04/13/2015  . Essential hypertension 04/13/2015  . Faintness   . Subdural hematoma ([place]) 03/17/2015    Past Surgical History:  Procedure Laterality Date  . AV FISTULA PLACEMENT Left 10/18/2014   Procedure: ARTERIOVENOUS (AV) FISTULA CREATION;  Surgeon: Angelia Mould, MD;  Location: [facility];  Service: Vascular;  Laterality: Left;  . BURR HOLE Right 03/17/2015   Procedure: BURR HOLES FOR RIGHT SUBDURAL HEMATOMA;  Surgeon: Leeroy Cha, MD;  Location: [facility];  Service: Neurosurgery;  Laterality: Right;  .  HEMORRHOID SURGERY    . IR REMOVAL TUN ACCESS W/ PORT W/O FL MOD SED  05/12/2017        Home Medications    Prior to Admission medications   Medication Sig Start Date End Date Taking? Authorizing Provider  aspirin 325 MG tablet Take 1 tablet (325 mg total) by mouth daily. 04/21/15  Yes Reyne Dumas, MD  atorvastatin (LIPITOR) 10 MG tablet TAKE 1 TABLET BY MOUTH EVERY DAY 05/12/18  Yes [name], FNP  cinacalcet (SENSIPAR) 30 MG tablet Take 30 mg by mouth daily.   Yes [provider]  cloNIDine (CATAPRES) 0.2 MG tablet  Take 0.2 mg by mouth daily.    Yes [provider]  levETIRAcetam (KEPPRA) 500 MG tablet Take 1 tablet (500 mg total) by mouth 2 (two) times daily. 06/16/15  Yes Gorsuch, Ni, MD  levothyroxine (SYNTHROID, LEVOTHROID) 25 MCG tablet TAKE 1 TABLET BY MOUTH EVERY DAY 05/11/18  Yes [name], FNP  metoprolol (LOPRESSOR) 100 MG tablet Take 100 mg by mouth 2 (two) times daily.    Yes [provider]  multivitamin (RENA-VIT) TABS tablet Take 1 tablet by mouth at bedtime. 06/03/15  Yes Hollice Gong, Mir Earlie Server, MD    Family History Family History  Problem Relation Age of Onset  . Hypertension Mother   . Cancer Paternal Uncle        prostate ca    Social History Social History   Tobacco Use  . Smoking status: Former Smoker    Types: Pipe    Quit date: 09/22/1984    Years since quitting: 33.8  . Smokeless tobacco: Never Used  Substance Use Topics  . Alcohol use: No    Alcohol/week: 0.0 standard drinks  . Drug use: No     Allergies   Tape   Review of Systems Review of Systems  Musculoskeletal: Positive for arthralgias and myalgias.  All other systems reviewed and are negative.    Physical Exam Updated Vital Signs BP 93/69   Pulse (!) 59   Temp 98.2 F (36.8 C) (Oral)   Resp 16   Ht 5\' 11"  (1.803 m)   Wt 89.8 kg   SpO2 97%   BMI 27.62 kg/m   Physical Exam Vitals signs and nursing note reviewed.  Constitutional:      General: He is not in acute distress.    Appearance: He is well-developed.     Comments: NAD.  HENT:     Head: Normocephalic and atraumatic.     Comments: No facial, scalp bone tenderness. No signs of trauma to head or face. No hemotympanum. No battle's sign.     Right Ear: External ear normal.     Left Ear: External ear normal.     Nose: Nose normal.  Eyes:     General: No scleral icterus.    Conjunctiva/sclera: Conjunctivae normal.  Neck:     Musculoskeletal: Normal range of motion and neck supple.     Comments: C-spine: No  midline or paraspinal muscle tenderness. Full ROM of neck without pain.  Cardiovascular:     Rate and Rhythm: Normal rate and regular rhythm.     Heart sounds: Normal heart sounds. No murmur.     Comments: LUE fistula noted. Distal pulses symmetric.  Pulmonary:     Effort: Pulmonary effort is normal.     Breath sounds: Normal breath sounds.  Abdominal:     General: Abdomen is flat.     Tenderness: There is no abdominal tenderness.  Musculoskeletal: Normal range of motion.        General: Tenderness present.     Comments: Right upper extremity: TTP to lateral and posterior deltoid. Abduction up to 45 degrees only secondary to pain. No focal bony tenderness to scapula, clavicle, AC or [place] joint, proximal humerus.  No focal bony tenderness to elbow, forearm, wrist bones or digits.   Skin:    General: Skin is warm and dry.     Capillary Refill: Capillary refill takes less than 2 seconds.  Neurological:     Mental Status: He is alert and oriented to person, place, and time.     Cranial Nerves: Facial asymmetry present.     Motor: Weakness and atrophy present.     Comments: Patient with obvious right mouth droop, right arm (1/5) and leg weakness (3/5), foot drop. He can only wiggle right fingers, he states is his baseline. Can lift and hold leg 5+ seconds. Strength intact otherwise.  Sensation to light touch intact in face, hands and feet. Alert and oriented x 4.  Speech is fluent without dysarthria or dysphasia. Unable to lift and hold RUE, no left pronator drift. No leg drop. CN I not tested CN II grossly intact visual fields bilaterally. Unable to visualize posterior eye. CN III, IV, VI PEERL and EOMs intact bilaterally CN V light touch intact in all 3 divisions of trigeminal nerve CN VII facial movements symmetric CN VIII not tested CN IX, X no uvula deviation, symmetric rise of soft palate  CN XI 5/5 SCM and trapezius strength bilaterally  CN XII Midline tongue protrusion, symmetric  L/R movements  Psychiatric:        Behavior: Behavior normal.        Thought Content: Thought content normal.        Judgment: Judgment normal.      ED Treatments / Results  Labs (all labs ordered are listed, but only abnormal results are displayed) Labs Reviewed - No data to display  EKG None  Radiology Dg Scapula Right  Result Date: 07/11/2018 CLINICAL DATA:  Pain after fall EXAM: RIGHT SCAPULA - 2+ VIEWS COMPARISON:  None FINDINGS: There is a fracture through the base of the right humeral head described on the shoulder films. No scapular fracture noted. IMPRESSION: Fracture through the base of the right humeral head. No scapular fracture identified. Electronically Signed   By: Dorise Bullion III M.D   On: 07/11/2018 08:32   Dg Shoulder Right  Result Date: 07/11/2018 CLINICAL DATA:  Pain after fall EXAM: RIGHT SHOULDER - 2+ VIEW COMPARISON:  None. FINDINGS: There is a fracture at the base of the right humeral head with a medial step-off. This is also seen on the scapular views of the same day. Evaluation for dislocation is limited due to lack of an axillary view and a limited transscapular Y-view. However, there appears to be no dislocation. No other abnormalities. IMPRESSION: 1. Fracture through the base of the right humeral head. Limited evaluation for dislocation. However, no dislocation is identified. Electronically Signed   By: Dorise Bullion III M.D   On: 07/11/2018 08:31    Procedures Procedures (including critical care time)  Medications Ordered in ED Medications  oxyCODONE-acetaminophen (PERCOCET/ROXICET) 5-325 MG per tablet 1 tablet (1 tablet Oral Given 07/11/18 5784)  acetaminophen (TYLENOL) tablet 650 mg (650 mg Oral Given 07/11/18 6962)     Initial Impression / Assessment and Plan / ED Course  I have reviewed the triage vital signs and the nursing  notes.  Pertinent labs & imaging results that were available during my care of the patient were reviewed by me and  considered in my medical decision making (see chart for details).  Clinical Course as of Jul 11 850  Sat Jul 11, 2018  0836 IMPRESSION: 1. Fracture through the base of the right humeral head. Limited evaluation for dislocation. However, no dislocation is identified.  DG Scapula Right [CG]    Clinical Course User Index [CG] Kinnie Feil, PA-C      69 year old male here after mechanical fall last night.  Reports pain to the right proximal shoulder.  Exam shows lateral/posterior deltoid tenderness and decreased range of motion.  Exam is limited due to baseline neuro and motor functional deficits from stroke.  His motor function is at baseline per patient.  Sensation and perfusion is intact in the affected extremity.  No obvious signs of other significant head, CTL spine, chest injury.  X-rays show right proximal humeral head fracture.  Discussed findings with patient.  Will discharge with a shoulder sling immobilizer, Tylenol, oxycodone for breakthrough pain.  Follow-up with orthopedist in 1 week for further reevaluation and management.  Return precautions given.  Shared visit with EDP. Final Clinical Impressions(s) / ED Diagnoses    Final diagnoses:  Other closed nondisplaced fracture of proximal end of right humerus, initial encounter  Fall, initial encounter    ED Discharge Orders    None       Kinnie Feil, PA-C 07/11/18 1275    Blanchie Dessert, MD 07/11/18 1100

## 2018-07-11 NOTE — Discharge Instructions (Addendum)
You were seen in the ER after a fall and right shoulder pain.  X-ray showed you have a right proximal humerus fracture.  This is a stable fracture and is initially treated with shoulder immobilization sling.  For pain you can use a combination of ibuprofen and acetaminophen.  Take [phone] mg acetaminophen (tylenol) every 6 hours. Do not exceed 4,000 mg acetaminophen in a 24 hour period.  Do not take acetaminophen if you have liver disease.   For break through and/or severe pain despite ibuprofen and acetaminophen regimen, take 5 mg oxycodone every 4 hours.  Oxycodone is a narcotic pain medication that has risk of overdose, death, dependence and abuse. Mild and expected side effects include nausea, stomach upset, drowsiness, constipation. Do not consume alcohol, drive or use heavy machinery while taking this medication. Do not leave unattended around children. Flush any remaining pills that you do not use and do not share.  The emergency department has a strict policy regarding prescription of narcotic medications. We prescribe a short course for acute, new pain or injuries. We are unable to refill this medication in the emergency department for chronic pain or repeatedly.  Refill need to be done by specialist or primary care provider or pain clinic.  Contact your primary care provider or specialist for chronic pain management and refill on narcotic medications.

## 2018-07-11 NOTE — ED Notes (Signed)
Pt c/o right arm/shoulder pain 10/10. This nurse notifed EDP, awaiting orders.

## 2018-07-11 NOTE — ED Notes (Addendum)
Ortho Tech consulted.

## 2018-07-11 NOTE — ED Notes (Signed)
Pt d/c home per MD order. Discharge summary reviewed with pt. Pt verbalizes understanding. Off unit via WC. Pt family in parking lot for discharge.

## 2018-07-13 ENCOUNTER — Ambulatory Visit: Payer: Self-pay

## 2018-07-13 DIAGNOSIS — Z992 Dependence on renal dialysis: Secondary | ICD-10-CM

## 2018-07-13 DIAGNOSIS — I10 Essential (primary) hypertension: Secondary | ICD-10-CM

## 2018-07-13 DIAGNOSIS — N186 End stage renal disease: Secondary | ICD-10-CM

## 2018-07-13 NOTE — Chronic Care Management (AMB) (Signed)
  Chronic Care Management   Telephone Outreach Note  07/13/2018 Name: Javier Jenkins MRN: [id number] DOB: 02/22/1950  Referred by: patient's health plan.   I reached out to [name] today by phone in response to a referral sent by [name]'s health plan. Javier Jenkins and I briefly discussed care management needs related to HTN.  Javier Jenkins was given information about Chronic Care Management services today including:  1. CCM service includes personalized support from designated clinical staff supervised by his physician, including individualized plan of care and coordination with other care providers 2. 24/7 contact phone numbers for assistance for urgent and routine care needs. 3. Service will only be billed when office clinical staff spend 20 minutes or more in a month to coordinate care. 4. Only one practitioner may furnish and bill the service in a calendar month. 5. The patient may stop CCM services at any time (effective at the end of the month) by phone call to the office staff. 6. The patient will be responsible for cost sharing (co-pay) of up to 20% of the service fee (after annual deductible is met).  Patient did not agree to enrollment in care management services and does not wish to consider at this time. The patient states "I already have two or three doctors and don't need any more help".  No further follow up required: The patient understands he may contact his primary care provider for a future referral to the chronic care management programs.   [name], FNP has been notified of this outreach and [name]'s decision and plan.   Daneen Schick, BSW, CDP Social Worker, Certified Dementia Practitioner [facility] [phone]

## 2018-07-14 DIAGNOSIS — N2581 Secondary hyperparathyroidism of renal origin: Secondary | ICD-10-CM | POA: Diagnosis not present

## 2018-07-14 DIAGNOSIS — D631 Anemia in chronic kidney disease: Secondary | ICD-10-CM | POA: Diagnosis not present

## 2018-07-14 DIAGNOSIS — N186 End stage renal disease: Secondary | ICD-10-CM | POA: Diagnosis not present

## 2018-07-14 DIAGNOSIS — Z992 Dependence on renal dialysis: Secondary | ICD-10-CM | POA: Diagnosis not present

## 2018-07-14 DIAGNOSIS — D509 Iron deficiency anemia, unspecified: Secondary | ICD-10-CM | POA: Diagnosis not present

## 2018-07-14 DIAGNOSIS — E119 Type 2 diabetes mellitus without complications: Secondary | ICD-10-CM | POA: Diagnosis not present

## 2018-07-16 DIAGNOSIS — E119 Type 2 diabetes mellitus without complications: Secondary | ICD-10-CM | POA: Diagnosis not present

## 2018-07-16 DIAGNOSIS — N186 End stage renal disease: Secondary | ICD-10-CM | POA: Diagnosis not present

## 2018-07-16 DIAGNOSIS — N2581 Secondary hyperparathyroidism of renal origin: Secondary | ICD-10-CM | POA: Diagnosis not present

## 2018-07-16 DIAGNOSIS — D509 Iron deficiency anemia, unspecified: Secondary | ICD-10-CM | POA: Diagnosis not present

## 2018-07-16 DIAGNOSIS — D631 Anemia in chronic kidney disease: Secondary | ICD-10-CM | POA: Diagnosis not present

## 2018-07-16 DIAGNOSIS — Z992 Dependence on renal dialysis: Secondary | ICD-10-CM | POA: Diagnosis not present

## 2018-07-18 DIAGNOSIS — N2581 Secondary hyperparathyroidism of renal origin: Secondary | ICD-10-CM | POA: Diagnosis not present

## 2018-07-18 DIAGNOSIS — D509 Iron deficiency anemia, unspecified: Secondary | ICD-10-CM | POA: Diagnosis not present

## 2018-07-18 DIAGNOSIS — D631 Anemia in chronic kidney disease: Secondary | ICD-10-CM | POA: Diagnosis not present

## 2018-07-18 DIAGNOSIS — E119 Type 2 diabetes mellitus without complications: Secondary | ICD-10-CM | POA: Diagnosis not present

## 2018-07-18 DIAGNOSIS — N186 End stage renal disease: Secondary | ICD-10-CM | POA: Diagnosis not present

## 2018-07-18 DIAGNOSIS — Z992 Dependence on renal dialysis: Secondary | ICD-10-CM | POA: Diagnosis not present

## 2018-07-20 ENCOUNTER — Ambulatory Visit (INDEPENDENT_AMBULATORY_CARE_PROVIDER_SITE_OTHER): Payer: Medicare Other | Admitting: Nurse Practitioner

## 2018-07-20 ENCOUNTER — Encounter: Payer: Self-pay | Admitting: Nurse Practitioner

## 2018-07-20 ENCOUNTER — Other Ambulatory Visit: Payer: Self-pay

## 2018-07-20 VITALS — HR 64 | Temp 98.1°F | Ht 71.0 in | Wt 206.8 lb

## 2018-07-20 DIAGNOSIS — Z8579 Personal history of other malignant neoplasms of lymphoid, hematopoietic and related tissues: Secondary | ICD-10-CM | POA: Diagnosis not present

## 2018-07-20 DIAGNOSIS — W19XXXA Unspecified fall, initial encounter: Secondary | ICD-10-CM

## 2018-07-20 DIAGNOSIS — M859 Disorder of bone density and structure, unspecified: Secondary | ICD-10-CM | POA: Diagnosis not present

## 2018-07-20 DIAGNOSIS — Z79899 Other long term (current) drug therapy: Secondary | ICD-10-CM

## 2018-07-20 DIAGNOSIS — S42201A Unspecified fracture of upper end of right humerus, initial encounter for closed fracture: Secondary | ICD-10-CM | POA: Diagnosis not present

## 2018-07-20 DIAGNOSIS — M8468XA Pathological fracture in other disease, other site, initial encounter for fracture: Secondary | ICD-10-CM

## 2018-07-20 DIAGNOSIS — S42294K Other nondisplaced fracture of upper end of right humerus, subsequent encounter for fracture with nonunion: Secondary | ICD-10-CM

## 2018-07-20 NOTE — Progress Notes (Addendum)
Subjective:     Patient ID: Javier Jenkins , male    DOB: [birth year] , 68 y.o.   MRN: [id number]   Chief Complaint  Patient presents with  . Fall    patient stated he fell on 06/12 he stated he went to the orthopaedic today    HPI  Here today to be evaluated after having a fall and fracturing his right humerus. He was at the backdoor of his him when he tripped and fell down onto the floor landing on his right arm. This same arm was tucked underneath him. This is the same arm he has weakness related to a stroke. He has already seen the orthopedic and is to watch for now and heal.  No current medications for pain.      Fall The accident occurred more than 1 week ago. Fall occurred: fell out back door. Pertinent negatives include no headaches.     Past Medical History:  Diagnosis Date  . Chronic kidney disease    stage 5  . Constipation   . Diabetes mellitus without complication ([place])    not on medications  . 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 Hypertension   . Hypothyroidism   . Non-Hodgkin lymphoma of intra-abdominal lymph nodes ([place]) 06/07/2015  . 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 Stroke [facility])    right side paralysis     Family History  Problem Relation Age of Onset  . Hypertension Mother   . Cancer Paternal Uncle        prostate ca     Current Outpatient Medications:  .  aspirin 325 MG tablet, Take 1 tablet (325 mg total) by mouth daily., Disp: 30 tablet, Rfl: 0 .  atorvastatin (LIPITOR) 10 MG tablet, TAKE 1 TABLET BY MOUTH EVERY DAY, Disp: 90 tablet, Rfl: 0 .  cinacalcet (SENSIPAR) 30 MG tablet, Take 30 mg by mouth daily., Disp: , Rfl:  .  cloNIDine (CATAPRES) 0.2 MG tablet, Take 0.2 mg by mouth daily. , Disp: , Rfl:  .  levETIRAcetam (KEPPRA) 500 MG tablet, Take 1 tablet (500 mg total) by mouth 2 (two) times daily., Disp: 60 tablet, Rfl: 6 .  levothyroxine (SYNTHROID, LEVOTHROID) 25 MCG tablet, TAKE 1 TABLET BY  MOUTH EVERY DAY, Disp: 90 tablet, Rfl: 1 .  metoprolol (LOPRESSOR) 100 MG tablet, Take 100 mg by mouth 2 (two) times daily. , Disp: , Rfl:  .  multivitamin (RENA-VIT) TABS tablet, Take 1 tablet by mouth at bedtime., Disp: 90 tablet, Rfl: 0   Allergies  Allergen Reactions  . Tape Dermatitis    Paper tape     Review of Systems  Constitutional: Negative.   Respiratory: Negative.   Cardiovascular: Negative.  Negative for chest pain, palpitations and leg swelling.  Musculoskeletal: Negative.        Right arm pain   Neurological: Negative.  Negative for dizziness and headaches.     Today's Vitals   07/20/18 1550  Pulse: 64  Temp: 98.1 F (36.7 C)  TempSrc: Oral  Weight: 206 lb 12.8 oz (93.8 kg)  Height: 5\' 11"  (1.803 m)  PainSc: 2   PainLoc: Arm   Body mass index is 28.84 kg/m.   Objective:  Physical Exam Constitutional:      Appearance: Normal appearance.  Cardiovascular:     Rate and Rhythm: Normal rate and regular rhythm.     Pulses: Normal pulses.     Heart sounds: Normal heart sounds. No  murmur.  Pulmonary:     Effort: Pulmonary effort is normal. No respiratory distress.     Breath sounds: Normal breath sounds.  Skin:    General: Skin is warm and dry.     Capillary Refill: Capillary refill takes less than 2 seconds.  Neurological:     General: No focal deficit present.     Mental Status: He is alert and oriented to person, place, and time.     Cranial Nerves: No cranial nerve deficit.  Psychiatric:        Mood and Affect: Mood normal.        Behavior: Behavior normal.        Thought Content: Thought content normal.        Judgment: Judgment normal.         Assessment And Plan:     1. 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 Disorder of bone density and structure,  unspecified  Will check bone density due to previous fall and fractured right humerus - DG Bone Density; Future  3. Encounter for long-term (current) use of medications  - DG Bone Density; Future  4. Pathological fracture in other disease, other site, initial encounter for fracture   History of lymphoma will check a bone density - DG Bone Density; Future   [name], FNP    THE PATIENT IS ENCOURAGED TO PRACTICE SOCIAL DISTANCING DUE TO THE COVID-19 PANDEMIC.

## 2018-07-21 DIAGNOSIS — N186 End stage renal disease: Secondary | ICD-10-CM | POA: Diagnosis not present

## 2018-07-21 DIAGNOSIS — D631 Anemia in chronic kidney disease: Secondary | ICD-10-CM | POA: Diagnosis not present

## 2018-07-21 DIAGNOSIS — N2581 Secondary hyperparathyroidism of renal origin: Secondary | ICD-10-CM | POA: Diagnosis not present

## 2018-07-21 DIAGNOSIS — D509 Iron deficiency anemia, unspecified: Secondary | ICD-10-CM | POA: Diagnosis not present

## 2018-07-21 DIAGNOSIS — E119 Type 2 diabetes mellitus without complications: Secondary | ICD-10-CM | POA: Diagnosis not present

## 2018-07-21 DIAGNOSIS — Z992 Dependence on renal dialysis: Secondary | ICD-10-CM | POA: Diagnosis not present

## 2018-07-23 DIAGNOSIS — N186 End stage renal disease: Secondary | ICD-10-CM | POA: Diagnosis not present

## 2018-07-23 DIAGNOSIS — N2581 Secondary hyperparathyroidism of renal origin: Secondary | ICD-10-CM | POA: Diagnosis not present

## 2018-07-23 DIAGNOSIS — E119 Type 2 diabetes mellitus without complications: Secondary | ICD-10-CM | POA: Diagnosis not present

## 2018-07-23 DIAGNOSIS — Z992 Dependence on renal dialysis: Secondary | ICD-10-CM | POA: Diagnosis not present

## 2018-07-23 DIAGNOSIS — D631 Anemia in chronic kidney disease: Secondary | ICD-10-CM | POA: Diagnosis not present

## 2018-07-23 DIAGNOSIS — D509 Iron deficiency anemia, unspecified: Secondary | ICD-10-CM | POA: Diagnosis not present

## 2018-07-25 DIAGNOSIS — D509 Iron deficiency anemia, unspecified: Secondary | ICD-10-CM | POA: Diagnosis not present

## 2018-07-25 DIAGNOSIS — E119 Type 2 diabetes mellitus without complications: Secondary | ICD-10-CM | POA: Diagnosis not present

## 2018-07-25 DIAGNOSIS — D631 Anemia in chronic kidney disease: Secondary | ICD-10-CM | POA: Diagnosis not present

## 2018-07-25 DIAGNOSIS — Z992 Dependence on renal dialysis: Secondary | ICD-10-CM | POA: Diagnosis not present

## 2018-07-25 DIAGNOSIS — N186 End stage renal disease: Secondary | ICD-10-CM | POA: Diagnosis not present

## 2018-07-25 DIAGNOSIS — N2581 Secondary hyperparathyroidism of renal origin: Secondary | ICD-10-CM | POA: Diagnosis not present

## 2018-07-28 DIAGNOSIS — E119 Type 2 diabetes mellitus without complications: Secondary | ICD-10-CM | POA: Diagnosis not present

## 2018-07-28 DIAGNOSIS — D631 Anemia in chronic kidney disease: Secondary | ICD-10-CM | POA: Diagnosis not present

## 2018-07-28 DIAGNOSIS — N2581 Secondary hyperparathyroidism of renal origin: Secondary | ICD-10-CM | POA: Diagnosis not present

## 2018-07-28 DIAGNOSIS — N186 End stage renal disease: Secondary | ICD-10-CM | POA: Diagnosis not present

## 2018-07-28 DIAGNOSIS — D509 Iron deficiency anemia, unspecified: Secondary | ICD-10-CM | POA: Diagnosis not present

## 2018-07-28 DIAGNOSIS — Z992 Dependence on renal dialysis: Secondary | ICD-10-CM | POA: Diagnosis not present

## 2018-07-29 DIAGNOSIS — Z992 Dependence on renal dialysis: Secondary | ICD-10-CM | POA: Diagnosis not present

## 2018-07-29 DIAGNOSIS — I129 Hypertensive chronic kidney disease with stage 1 through stage 4 chronic kidney disease, or unspecified chronic kidney disease: Secondary | ICD-10-CM | POA: Diagnosis not present

## 2018-07-29 DIAGNOSIS — N186 End stage renal disease: Secondary | ICD-10-CM | POA: Diagnosis not present

## 2018-07-30 DIAGNOSIS — Z992 Dependence on renal dialysis: Secondary | ICD-10-CM | POA: Diagnosis not present

## 2018-07-30 DIAGNOSIS — E119 Type 2 diabetes mellitus without complications: Secondary | ICD-10-CM | POA: Diagnosis not present

## 2018-07-30 DIAGNOSIS — D509 Iron deficiency anemia, unspecified: Secondary | ICD-10-CM | POA: Diagnosis not present

## 2018-07-30 DIAGNOSIS — N2581 Secondary hyperparathyroidism of renal origin: Secondary | ICD-10-CM | POA: Diagnosis not present

## 2018-07-30 DIAGNOSIS — N186 End stage renal disease: Secondary | ICD-10-CM | POA: Diagnosis not present

## 2018-07-30 DIAGNOSIS — D631 Anemia in chronic kidney disease: Secondary | ICD-10-CM | POA: Diagnosis not present

## 2018-08-01 DIAGNOSIS — E119 Type 2 diabetes mellitus without complications: Secondary | ICD-10-CM | POA: Diagnosis not present

## 2018-08-01 DIAGNOSIS — N186 End stage renal disease: Secondary | ICD-10-CM | POA: Diagnosis not present

## 2018-08-01 DIAGNOSIS — D631 Anemia in chronic kidney disease: Secondary | ICD-10-CM | POA: Diagnosis not present

## 2018-08-01 DIAGNOSIS — N2581 Secondary hyperparathyroidism of renal origin: Secondary | ICD-10-CM | POA: Diagnosis not present

## 2018-08-01 DIAGNOSIS — Z992 Dependence on renal dialysis: Secondary | ICD-10-CM | POA: Diagnosis not present

## 2018-08-01 DIAGNOSIS — D509 Iron deficiency anemia, unspecified: Secondary | ICD-10-CM | POA: Diagnosis not present

## 2018-08-03 DIAGNOSIS — L84 Corns and callosities: Secondary | ICD-10-CM | POA: Diagnosis not present

## 2018-08-03 DIAGNOSIS — L602 Onychogryphosis: Secondary | ICD-10-CM | POA: Diagnosis not present

## 2018-08-03 DIAGNOSIS — E1351 Other specified diabetes mellitus with diabetic peripheral angiopathy without gangrene: Secondary | ICD-10-CM | POA: Diagnosis not present

## 2018-08-04 DIAGNOSIS — Z992 Dependence on renal dialysis: Secondary | ICD-10-CM | POA: Diagnosis not present

## 2018-08-04 DIAGNOSIS — N2581 Secondary hyperparathyroidism of renal origin: Secondary | ICD-10-CM | POA: Diagnosis not present

## 2018-08-04 DIAGNOSIS — D509 Iron deficiency anemia, unspecified: Secondary | ICD-10-CM | POA: Diagnosis not present

## 2018-08-04 DIAGNOSIS — D631 Anemia in chronic kidney disease: Secondary | ICD-10-CM | POA: Diagnosis not present

## 2018-08-04 DIAGNOSIS — E119 Type 2 diabetes mellitus without complications: Secondary | ICD-10-CM | POA: Diagnosis not present

## 2018-08-04 DIAGNOSIS — N186 End stage renal disease: Secondary | ICD-10-CM | POA: Diagnosis not present

## 2018-08-06 ENCOUNTER — Other Ambulatory Visit: Payer: Self-pay | Admitting: Nurse Practitioner

## 2018-08-06 DIAGNOSIS — N186 End stage renal disease: Secondary | ICD-10-CM | POA: Diagnosis not present

## 2018-08-06 DIAGNOSIS — D509 Iron deficiency anemia, unspecified: Secondary | ICD-10-CM | POA: Diagnosis not present

## 2018-08-06 DIAGNOSIS — N2581 Secondary hyperparathyroidism of renal origin: Secondary | ICD-10-CM | POA: Diagnosis not present

## 2018-08-06 DIAGNOSIS — D631 Anemia in chronic kidney disease: Secondary | ICD-10-CM | POA: Diagnosis not present

## 2018-08-06 DIAGNOSIS — Z992 Dependence on renal dialysis: Secondary | ICD-10-CM | POA: Diagnosis not present

## 2018-08-06 DIAGNOSIS — E119 Type 2 diabetes mellitus without complications: Secondary | ICD-10-CM | POA: Diagnosis not present

## 2018-08-08 DIAGNOSIS — D631 Anemia in chronic kidney disease: Secondary | ICD-10-CM | POA: Diagnosis not present

## 2018-08-08 DIAGNOSIS — N2581 Secondary hyperparathyroidism of renal origin: Secondary | ICD-10-CM | POA: Diagnosis not present

## 2018-08-08 DIAGNOSIS — D509 Iron deficiency anemia, unspecified: Secondary | ICD-10-CM | POA: Diagnosis not present

## 2018-08-08 DIAGNOSIS — N186 End stage renal disease: Secondary | ICD-10-CM | POA: Diagnosis not present

## 2018-08-08 DIAGNOSIS — Z992 Dependence on renal dialysis: Secondary | ICD-10-CM | POA: Diagnosis not present

## 2018-08-08 DIAGNOSIS — E119 Type 2 diabetes mellitus without complications: Secondary | ICD-10-CM | POA: Diagnosis not present

## 2018-08-10 DIAGNOSIS — S42201D Unspecified fracture of upper end of right humerus, subsequent encounter for fracture with routine healing: Secondary | ICD-10-CM | POA: Diagnosis not present

## 2018-08-11 DIAGNOSIS — D631 Anemia in chronic kidney disease: Secondary | ICD-10-CM | POA: Diagnosis not present

## 2018-08-11 DIAGNOSIS — N186 End stage renal disease: Secondary | ICD-10-CM | POA: Diagnosis not present

## 2018-08-11 DIAGNOSIS — D509 Iron deficiency anemia, unspecified: Secondary | ICD-10-CM | POA: Diagnosis not present

## 2018-08-11 DIAGNOSIS — Z992 Dependence on renal dialysis: Secondary | ICD-10-CM | POA: Diagnosis not present

## 2018-08-11 DIAGNOSIS — N2581 Secondary hyperparathyroidism of renal origin: Secondary | ICD-10-CM | POA: Diagnosis not present

## 2018-08-11 DIAGNOSIS — E119 Type 2 diabetes mellitus without complications: Secondary | ICD-10-CM | POA: Diagnosis not present

## 2018-08-13 DIAGNOSIS — Z992 Dependence on renal dialysis: Secondary | ICD-10-CM | POA: Diagnosis not present

## 2018-08-13 DIAGNOSIS — N186 End stage renal disease: Secondary | ICD-10-CM | POA: Diagnosis not present

## 2018-08-13 DIAGNOSIS — N2581 Secondary hyperparathyroidism of renal origin: Secondary | ICD-10-CM | POA: Diagnosis not present

## 2018-08-13 DIAGNOSIS — D509 Iron deficiency anemia, unspecified: Secondary | ICD-10-CM | POA: Diagnosis not present

## 2018-08-13 DIAGNOSIS — E119 Type 2 diabetes mellitus without complications: Secondary | ICD-10-CM | POA: Diagnosis not present

## 2018-08-13 DIAGNOSIS — D631 Anemia in chronic kidney disease: Secondary | ICD-10-CM | POA: Diagnosis not present

## 2018-08-15 DIAGNOSIS — D631 Anemia in chronic kidney disease: Secondary | ICD-10-CM | POA: Diagnosis not present

## 2018-08-15 DIAGNOSIS — N2581 Secondary hyperparathyroidism of renal origin: Secondary | ICD-10-CM | POA: Diagnosis not present

## 2018-08-15 DIAGNOSIS — Z992 Dependence on renal dialysis: Secondary | ICD-10-CM | POA: Diagnosis not present

## 2018-08-15 DIAGNOSIS — E119 Type 2 diabetes mellitus without complications: Secondary | ICD-10-CM | POA: Diagnosis not present

## 2018-08-15 DIAGNOSIS — N186 End stage renal disease: Secondary | ICD-10-CM | POA: Diagnosis not present

## 2018-08-15 DIAGNOSIS — D509 Iron deficiency anemia, unspecified: Secondary | ICD-10-CM | POA: Diagnosis not present

## 2018-08-18 ENCOUNTER — Telehealth (HOSPITAL_COMMUNITY): Payer: Self-pay | Admitting: Rehabilitation

## 2018-08-18 DIAGNOSIS — N2581 Secondary hyperparathyroidism of renal origin: Secondary | ICD-10-CM | POA: Diagnosis not present

## 2018-08-18 DIAGNOSIS — E119 Type 2 diabetes mellitus without complications: Secondary | ICD-10-CM | POA: Diagnosis not present

## 2018-08-18 DIAGNOSIS — D509 Iron deficiency anemia, unspecified: Secondary | ICD-10-CM | POA: Diagnosis not present

## 2018-08-18 DIAGNOSIS — D631 Anemia in chronic kidney disease: Secondary | ICD-10-CM | POA: Diagnosis not present

## 2018-08-18 DIAGNOSIS — Z992 Dependence on renal dialysis: Secondary | ICD-10-CM | POA: Diagnosis not present

## 2018-08-18 DIAGNOSIS — N186 End stage renal disease: Secondary | ICD-10-CM | POA: Diagnosis not present

## 2018-08-18 NOTE — Telephone Encounter (Signed)

## 2018-08-19 ENCOUNTER — Other Ambulatory Visit: Payer: Self-pay | Admitting: *Deleted

## 2018-08-19 ENCOUNTER — Encounter: Payer: Self-pay | Admitting: *Deleted

## 2018-08-19 ENCOUNTER — Encounter: Payer: Self-pay | Admitting: Vascular Surgery

## 2018-08-19 ENCOUNTER — Other Ambulatory Visit: Payer: Self-pay

## 2018-08-19 ENCOUNTER — Ambulatory Visit (INDEPENDENT_AMBULATORY_CARE_PROVIDER_SITE_OTHER): Payer: Medicare Other | Admitting: Vascular Surgery

## 2018-08-19 VITALS — BP 102/69 | HR 61 | Temp 97.7°F | Resp 20 | Ht 71.0 in | Wt 203.0 lb

## 2018-08-19 DIAGNOSIS — Z992 Dependence on renal dialysis: Secondary | ICD-10-CM

## 2018-08-19 DIAGNOSIS — N186 End stage renal disease: Secondary | ICD-10-CM

## 2018-08-19 NOTE — H&P (View-Only) (Signed)
Patient name: Javier Jenkins MRN: [id number] DOB: Aug 13, 1950 Sex: male    The patient is referred with an ulcer overlying his AV fistula.  The consult is requested by Dr. Madelon Lips.  HPI:   Javier Jenkins is a pleasant 68 y.o. male who presents with an ulceration overlying his left upper arm fistula.  Of note he dialyzes on Tuesdays Thursdays and Saturdays.  The fistula has been working well.  He just recently developed an ulceration over the more central portion of the fistula.  He has not had any bleeding problems from this area.  Past Medical History:  Diagnosis Date  . Chronic kidney disease    stage 5  . Constipation   . Diabetes mellitus without complication ([place])    not on medications  . 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 Hypertension   . Hypothyroidism   . Non-Hodgkin lymphoma of intra-abdominal lymph nodes ([place]) 06/07/2015  . 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 Stroke [facility])    right side paralysis    Family History  Problem Relation Age of Onset  . Hypertension Mother   . Cancer Paternal Uncle        prostate ca    SOCIAL HISTORY: Social History   Tobacco Use  . Smoking status: Former Smoker    Types: Pipe    Quit date: 09/22/1984    Years since quitting: 33.9  . Smokeless tobacco: Never Used  Substance Use Topics  . Alcohol use: No    Alcohol/week: 0.0 standard drinks    Allergies  Allergen Reactions  . Tape Dermatitis    Paper tape    Current Outpatient Medications  Medication Sig Dispense Refill  . aspirin 325 MG tablet Take 1 tablet (325 mg total) by mouth daily. 30 tablet 0  . atorvastatin (LIPITOR) 10 MG tablet TAKE 1 TABLET BY MOUTH EVERY DAY 90 tablet 0  . Calcium Acetate 667 MG TABS     . cinacalcet (SENSIPAR) 30 MG tablet Take 30 mg by mouth daily.    . cloNIDine (CATAPRES) 0.2 MG tablet Take 0.2 mg by mouth daily.     Marland Kitchen levETIRAcetam (KEPPRA) 500 MG tablet Take 1 tablet (500 mg  total) by mouth 2 (two) times daily. 60 tablet 6  . levothyroxine (SYNTHROID, LEVOTHROID) 25 MCG tablet TAKE 1 TABLET BY MOUTH EVERY DAY 90 tablet 1  . metoprolol (LOPRESSOR) 100 MG tablet Take 100 mg by mouth 2 (two) times daily.     . multivitamin (RENA-VIT) TABS tablet Take 1 tablet by mouth at bedtime. 90 tablet 0   No current facility-administered medications for this visit.     REVIEW OF SYSTEMS:  [X]  denotes positive finding, [ ]  denotes negative finding Cardiac  Comments:  Chest pain or chest pressure:    Shortness of breath upon exertion:    Short of breath when lying flat:    Irregular heart rhythm:        Vascular    Pain in calf, thigh, or hip brought on by ambulation:    Pain in feet at night that wakes you up from your sleep:     Blood clot in your veins:    Leg swelling:         Pulmonary    Oxygen at home:    Productive cough:     Wheezing:         Neurologic    Sudden weakness in arms or legs:  Sudden numbness in arms or legs:     Sudden onset of difficulty speaking or slurred speech:    Temporary loss of vision in one eye:     Problems with dizziness:         Gastrointestinal    Blood in stool:     Vomited blood:         Genitourinary    Burning when urinating:     Blood in urine:        Psychiatric    Major depression:         Hematologic    Bleeding problems:    Problems with blood clotting too easily:        Skin    Rashes or ulcers:        Constitutional    Fever or chills:     PHYSICAL EXAM:   Vitals:   08/19/18 1411  BP: 102/69  Pulse: 61  Resp: 20  Temp: 97.7 F (36.5 C)  SpO2: 99%  Weight: 203 lb (92.1 kg)  Height: 5\' 11"  (1.803 m)    GENERAL: The patient is a well-nourished male, in no acute distress. The vital signs are documented above. CARDIAC: There is a regular rate and rhythm.  VASCULAR: He has an excellent thrill in his left upper arm fistula.  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    PULMONARY: There is good air exchange bilaterally without wheezing or rales. ABDOMEN: Soft and non-tender with normal pitched bowel sounds.  MUSCULOSKELETAL: There are no major deformities or cyanosis. NEUROLOGIC: He has significant right-sided weakness from a previous stroke. SKIN: The ulcer overlying the aneurysm was documented above. PSYCHIATRIC: The patient has a normal affect.  DATA:    No new data  MEDICAL ISSUES:   ULCERATION OVERLYING LEFT UPPER ARM FISTULA: I have recommended that we excise this ulcer overlying the left upper arm fistula given the risk that this could enlarged and ultimately become a bleeding risk.  I think we could address this without having to necessarily plicate the aneurysm.  He dialyzes on Tuesdays Thursdays and Saturdays so we tentatively have him on for Friday, 09/04/2018.  I have discussed the procedure potential complications and he is agreeable to proceed.  [name] [facility] [phone]

## 2018-08-19 NOTE — Progress Notes (Signed)
Patient name: Javier Jenkins MRN: [id number] DOB: 1950/06/21 Sex: male    The patient is referred with an ulcer overlying his AV fistula.  The consult is requested by Dr. Madelon Lips.  HPI:   Javier Jenkins is a pleasant 68 y.o. male who presents with an ulceration overlying his left upper arm fistula.  Of note he dialyzes on Tuesdays Thursdays and Saturdays.  The fistula has been working well.  He just recently developed an ulceration over the more central portion of the fistula.  He has not had any bleeding problems from this area.  Past Medical History:  Diagnosis Date  . Chronic kidney disease    stage 5  . Constipation   . Diabetes mellitus without complication ([place])    not on medications  . 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 Hypertension   . Hypothyroidism   . Non-Hodgkin lymphoma of intra-abdominal lymph nodes ([place]) 06/07/2015  . 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 Stroke [facility])    right side paralysis    Family History  Problem Relation Age of Onset  . Hypertension Mother   . Cancer Paternal Uncle        prostate ca    SOCIAL HISTORY: Social History   Tobacco Use  . Smoking status: Former Smoker    Types: Pipe    Quit date: 09/22/1984    Years since quitting: 33.9  . Smokeless tobacco: Never Used  Substance Use Topics  . Alcohol use: No    Alcohol/week: 0.0 standard drinks    Allergies  Allergen Reactions  . Tape Dermatitis    Paper tape    Current Outpatient Medications  Medication Sig Dispense Refill  . aspirin 325 MG tablet Take 1 tablet (325 mg total) by mouth daily. 30 tablet 0  . atorvastatin (LIPITOR) 10 MG tablet TAKE 1 TABLET BY MOUTH EVERY DAY 90 tablet 0  . Calcium Acetate 667 MG TABS     . cinacalcet (SENSIPAR) 30 MG tablet Take 30 mg by mouth daily.    . cloNIDine (CATAPRES) 0.2 MG tablet Take 0.2 mg by mouth daily.     Marland Kitchen levETIRAcetam (KEPPRA) 500 MG tablet Take 1 tablet (500 mg  total) by mouth 2 (two) times daily. 60 tablet 6  . levothyroxine (SYNTHROID, LEVOTHROID) 25 MCG tablet TAKE 1 TABLET BY MOUTH EVERY DAY 90 tablet 1  . metoprolol (LOPRESSOR) 100 MG tablet Take 100 mg by mouth 2 (two) times daily.     . multivitamin (RENA-VIT) TABS tablet Take 1 tablet by mouth at bedtime. 90 tablet 0   No current facility-administered medications for this visit.     REVIEW OF SYSTEMS:  [X]  denotes positive finding, [ ]  denotes negative finding Cardiac  Comments:  Chest pain or chest pressure:    Shortness of breath upon exertion:    Short of breath when lying flat:    Irregular heart rhythm:        Vascular    Pain in calf, thigh, or hip brought on by ambulation:    Pain in feet at night that wakes you up from your sleep:     Blood clot in your veins:    Leg swelling:         Pulmonary    Oxygen at home:    Productive cough:     Wheezing:         Neurologic    Sudden weakness in arms or legs:  Sudden numbness in arms or legs:     Sudden onset of difficulty speaking or slurred speech:    Temporary loss of vision in one eye:     Problems with dizziness:         Gastrointestinal    Blood in stool:     Vomited blood:         Genitourinary    Burning when urinating:     Blood in urine:        Psychiatric    Major depression:         Hematologic    Bleeding problems:    Problems with blood clotting too easily:        Skin    Rashes or ulcers:        Constitutional    Fever or chills:     PHYSICAL EXAM:   Vitals:   08/19/18 1411  BP: 102/69  Pulse: 61  Resp: 20  Temp: 97.7 F (36.5 C)  SpO2: 99%  Weight: 203 lb (92.1 kg)  Height: 5\' 11"  (1.803 m)    GENERAL: The patient is a well-nourished male, in no acute distress. The vital signs are documented above. CARDIAC: There is a regular rate and rhythm.  VASCULAR: He has an excellent thrill in his left upper arm fistula.  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    PULMONARY: There is good air exchange bilaterally without wheezing or rales. ABDOMEN: Soft and non-tender with normal pitched bowel sounds.  MUSCULOSKELETAL: There are no major deformities or cyanosis. NEUROLOGIC: He has significant right-sided weakness from a previous stroke. SKIN: The ulcer overlying the aneurysm was documented above. PSYCHIATRIC: The patient has a normal affect.  DATA:    No new data  MEDICAL ISSUES:   ULCERATION OVERLYING LEFT UPPER ARM FISTULA: I have recommended that we excise this ulcer overlying the left upper arm fistula given the risk that this could enlarged and ultimately become a bleeding risk.  I think we could address this without having to necessarily plicate the aneurysm.  He dialyzes on Tuesdays Thursdays and Saturdays so we tentatively have him on for Friday, 09/04/2018.  I have discussed the procedure potential complications and he is agreeable to proceed.  [name] [facility] [phone]

## 2018-08-20 DIAGNOSIS — D509 Iron deficiency anemia, unspecified: Secondary | ICD-10-CM | POA: Diagnosis not present

## 2018-08-20 DIAGNOSIS — D631 Anemia in chronic kidney disease: Secondary | ICD-10-CM | POA: Diagnosis not present

## 2018-08-20 DIAGNOSIS — E119 Type 2 diabetes mellitus without complications: Secondary | ICD-10-CM | POA: Diagnosis not present

## 2018-08-20 DIAGNOSIS — Z992 Dependence on renal dialysis: Secondary | ICD-10-CM | POA: Diagnosis not present

## 2018-08-20 DIAGNOSIS — N186 End stage renal disease: Secondary | ICD-10-CM | POA: Diagnosis not present

## 2018-08-20 DIAGNOSIS — N2581 Secondary hyperparathyroidism of renal origin: Secondary | ICD-10-CM | POA: Diagnosis not present

## 2018-08-22 DIAGNOSIS — E119 Type 2 diabetes mellitus without complications: Secondary | ICD-10-CM | POA: Diagnosis not present

## 2018-08-22 DIAGNOSIS — N2581 Secondary hyperparathyroidism of renal origin: Secondary | ICD-10-CM | POA: Diagnosis not present

## 2018-08-22 DIAGNOSIS — D509 Iron deficiency anemia, unspecified: Secondary | ICD-10-CM | POA: Diagnosis not present

## 2018-08-22 DIAGNOSIS — Z992 Dependence on renal dialysis: Secondary | ICD-10-CM | POA: Diagnosis not present

## 2018-08-22 DIAGNOSIS — N186 End stage renal disease: Secondary | ICD-10-CM | POA: Diagnosis not present

## 2018-08-22 DIAGNOSIS — D631 Anemia in chronic kidney disease: Secondary | ICD-10-CM | POA: Diagnosis not present

## 2018-08-24 ENCOUNTER — Other Ambulatory Visit: Payer: Self-pay | Admitting: Hematology and Oncology

## 2018-08-24 ENCOUNTER — Inpatient Hospital Stay (HOSPITAL_BASED_OUTPATIENT_CLINIC_OR_DEPARTMENT_OTHER): Payer: Medicare Other | Admitting: Hematology and Oncology

## 2018-08-24 ENCOUNTER — Other Ambulatory Visit: Payer: Self-pay

## 2018-08-24 ENCOUNTER — Inpatient Hospital Stay: Payer: Medicare Other | Attending: Hematology and Oncology

## 2018-08-24 DIAGNOSIS — D631 Anemia in chronic kidney disease: Secondary | ICD-10-CM | POA: Insufficient documentation

## 2018-08-24 DIAGNOSIS — N186 End stage renal disease: Secondary | ICD-10-CM

## 2018-08-24 DIAGNOSIS — C8338 Diffuse large B-cell lymphoma, lymph nodes of multiple sites: Secondary | ICD-10-CM

## 2018-08-24 DIAGNOSIS — Z992 Dependence on renal dialysis: Secondary | ICD-10-CM

## 2018-08-24 DIAGNOSIS — Z79899 Other long term (current) drug therapy: Secondary | ICD-10-CM | POA: Insufficient documentation

## 2018-08-24 DIAGNOSIS — Z888 Allergy status to other drugs, medicaments and biological substances status: Secondary | ICD-10-CM

## 2018-08-24 LAB — CBC WITH DIFFERENTIAL/PLATELET
Abs Immature Granulocytes: 0.02 10*3/uL (ref 0.00–0.07)
Basophils Absolute: 0.1 10*3/uL (ref 0.0–0.1)
Basophils Relative: 1 %
Eosinophils Absolute: 1 10*3/uL — ABNORMAL HIGH (ref 0.0–0.5)
Eosinophils Relative: 15 %
HCT: 37.9 % — ABNORMAL LOW (ref 39.0–52.0)
Hemoglobin: 11.9 g/dL — ABNORMAL LOW (ref 13.0–17.0)
Immature Granulocytes: 0 %
Lymphocytes Relative: 30 %
Lymphs Abs: 2 10*3/uL (ref 0.7–4.0)
MCH: 32 pg (ref 26.0–34.0)
MCHC: 31.4 g/dL (ref 30.0–36.0)
MCV: 101.9 fL — ABNORMAL HIGH (ref 80.0–100.0)
Monocytes Absolute: 0.8 10*3/uL (ref 0.1–1.0)
Monocytes Relative: 12 %
Neutro Abs: 2.7 10*3/uL (ref 1.7–7.7)
Neutrophils Relative %: 42 %
Platelets: 170 10*3/uL (ref 150–400)
RBC: 3.72 MIL/uL — ABNORMAL LOW (ref 4.22–5.81)
RDW: 15.9 % — ABNORMAL HIGH (ref 11.5–15.5)
WBC: 6.6 10*3/uL (ref 4.0–10.5)
nRBC: 0 % (ref 0.0–0.2)

## 2018-08-25 ENCOUNTER — Telehealth: Payer: Self-pay | Admitting: Hematology and Oncology

## 2018-08-25 ENCOUNTER — Encounter: Payer: Self-pay | Admitting: Hematology and Oncology

## 2018-08-25 DIAGNOSIS — D631 Anemia in chronic kidney disease: Secondary | ICD-10-CM | POA: Diagnosis not present

## 2018-08-25 DIAGNOSIS — E119 Type 2 diabetes mellitus without complications: Secondary | ICD-10-CM | POA: Diagnosis not present

## 2018-08-25 DIAGNOSIS — N2581 Secondary hyperparathyroidism of renal origin: Secondary | ICD-10-CM | POA: Diagnosis not present

## 2018-08-25 DIAGNOSIS — N186 End stage renal disease: Secondary | ICD-10-CM | POA: Diagnosis not present

## 2018-08-25 DIAGNOSIS — D509 Iron deficiency anemia, unspecified: Secondary | ICD-10-CM | POA: Diagnosis not present

## 2018-08-25 DIAGNOSIS — Z992 Dependence on renal dialysis: Secondary | ICD-10-CM | POA: Diagnosis not present

## 2018-08-25 NOTE — Telephone Encounter (Signed)
I talk with patient regarding schedule  

## 2018-08-25 NOTE — Assessment & Plan Note (Signed)
This is likely anemia of chronic disease. The patient denies recent history of bleeding such as epistaxis, hematuria or hematochezia. He is asymptomatic from the anemia. We will observe for now.  I would defer ESA management to nephrologist.

## 2018-08-25 NOTE — Progress Notes (Signed)
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 The patient denies recent history of bleeding such as epistaxis, hematuria or hematochezia. He is asymptomatic from the anemia. We will observe for now.  I would defer ESA management to nephrologist.   No orders of the defined types were placed in this encounter.   INTERVAL HISTORY: Please see below for problem oriented charting. 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)  06/07/2015 Initial Diagnosis   Non-Hodgkin lymphoma of intra-abdominal lymph nodes ([place])   06/12/2015 Imaging   CT chest showed lymphadenopathy within the low neck and low chest, consistent with active lymphoma   06/14/2015 Procedure   He underwent CT-guided biopsy   06/14/2015 Pathology Results   Biopsy of the retroperitoneal mass confirmed diffuse large B-cell lymphoma   06/21/2015 Imaging   ECHO showed normal EF   06/21/2015 Procedure   He has port placement   06/23/2015 - 10/12/2015 Chemotherapy   He  received mini-RCHOP chemo x 6 cycles   08/25/2015 PET scan   Significant interval decrease in size of the bulky retroperitoneal lymphadenopathy suggesting an excellent response to therapy. Residual matted soft tissue density in the retroperitoneum with SUV max of approximately 3.3   11/27/2015 PET scan   Continued improved appearance of the treated lymphoma. Minimal residual matted soft tissue density but no discrete measurable disease and no hypermetabolism to suggest metabolically active tumor.   05/24/2016 Imaging   Unchanged retroperitoneal soft tissue most compatible with history of treated lymphoma. No new or enlarging adenopathy in the chest, abdomen or pelvis. Polycystic renal disease. Bilateral fat containing inguinal hernias. Aortic atherosclerosis.   01/15/2017 Imaging   1. Stable findings of treated tumor in the retroperitoneum and right pelvis. No evidence of recurrent lymphoma in the abdomen or pelvis. 2. New 2.1 cm nodular opacity in the dependent right lower lobe, nonspecific, more likely inflammatory. No thoracic adenopathy. Recommend attention on follow-up chest CT in 3 months. 3. Polycystic kidneys. Increased thin mural calcification associated with the dominant exophytic 6.2 cm medial lower left renal cyst, which is stable in size and technically indeterminate for neoplasm. Presumably, an IV contrast-enhanced study is precluded by poor renal function. Given the size stability, continued surveillance at CT abdomen is a reasonable option. 4. Chronic findings include: Aortic Atherosclerosis (ICD10-I70.0). Small hiatal hernia. Mild terminal ileum and large bowel diverticulosis. Mild prostatomegaly.   04/17/2017 Imaging   CT chest 1. Stable pleural or subpleural cyst in the right lower lobe. No worrisome pulmonary lesions or acute pulmonary findings. 2. No mediastinal or hilar mass or adenopathy. No supraclavicular or axillary adenopathy. CT abdomen  3. No abdominal/pelvic/inguinal  lymphadenopathy. 4. Polycystic kidney disease. 5. No acute abdominal/pelvic findings.   05/12/2017 Procedure   Technically successful tunneled Port catheter removal.   01/12/2018 Procedure   He had negative colonoscopy.  Internal hemorrhoids were noted.  Sessile polyp biopsy came back tubular adenoma      REVIEW OF SYSTEMS:   Constitutional: Denies fevers, chills or abnormal weight loss Eyes: Denies blurriness of vision Ears, nose, mouth, throat, and face: Denies mucositis or sore throat Respiratory: Denies cough, dyspnea or wheezes Cardiovascular: Denies palpitation, chest discomfort or lower extremity swelling Gastrointestinal:  Denies nausea, heartburn or change in bowel habits Skin: Denies abnormal skin rashes Lymphatics: Denies new lymphadenopathy or easy bruising Neurological:Denies numbness, tingling or new weaknesses Behavioral/Psych: Mood is stable, no new changes  All other systems were reviewed with the patient and are negative.  I have reviewed the past medical history, past surgical history, social history and family history with the patient and they are unchanged from previous note.  ALLERGIES:  is allergic to tape.  MEDICATIONS:  Current Outpatient Medications  Medication Sig Dispense Refill  . ferric citrate (AURYXIA) 1 GM 210 MG(Fe) tablet Take 420 mg by mouth 3 (three) times daily with meals.    Marland Kitchen aspirin 325 MG tablet Take 1 tablet (325 mg total) by mouth daily. 30 tablet 0  . atorvastatin (LIPITOR) 10 MG tablet TAKE 1 TABLET BY MOUTH EVERY DAY 90 tablet 0  . cinacalcet (SENSIPAR) 30 MG tablet Take 30 mg by mouth daily.    . cloNIDine (CATAPRES) 0.2 MG tablet Take 0.2 mg by mouth daily.     Marland Kitchen levETIRAcetam (KEPPRA) 500 MG tablet Take 1 tablet (500 mg total) by mouth 2 (two) times daily. 60 tablet 6  . levothyroxine (SYNTHROID, LEVOTHROID) 25 MCG tablet TAKE 1 TABLET BY MOUTH EVERY DAY 90 tablet 1  . metoprolol (LOPRESSOR) 100 MG tablet Take 100 mg by mouth 2  (two) times daily.     . multivitamin (RENA-VIT) TABS tablet Take 1 tablet by mouth at bedtime. 90 tablet 0   No current facility-administered medications for this visit.     PHYSICAL EXAMINATION: ECOG PERFORMANCE STATUS: 0 - Asymptomatic  Vitals:   08/24/18 1145  BP: 114/76  Pulse: 61  Resp: 18  Temp: 98.5 F (36.9 C)  SpO2: 100%   Filed Weights   08/24/18 1145  Weight: 203 lb 12.8 oz (92.4 kg)    GENERAL:alert, no distress and comfortable SKIN: skin color, texture, turgor are normal, no rashes or significant lesions EYES: normal, Conjunctiva are pink and non-injected, sclera clear OROPHARYNX:no exudate, no erythema and lips, buccal mucosa, and tongue normal  NECK: supple, thyroid normal size, non-tender, without nodularity LYMPH:  no palpable lymphadenopathy in the cervical, axillary or inguinal LUNGS: clear to auscultation and percussion with normal breathing effort HEART: regular rate & rhythm and no murmurs and no lower extremity edema ABDOMEN:abdomen soft, non-tender and normal bowel sounds Musculoskeletal:no cyanosis of digits and no clubbing  NEURO: alert & oriented x 3 with fluent speech, with right-sided weakness consistent with his prior stroke  LABORATORY DATA:  I have reviewed the data as listed    Component Value Date/Time   NA 139 11/14/2017 0459   NA 138 07/08/2016 1052   K 5.4 (H) 11/14/2017 0459   K 4.3 07/08/2016 1052   CL 96 (L) 11/14/2017 0459   CO2 29 11/14/2017 0459   CO2 29 07/08/2016 1052   GLUCOSE 116 (H) 11/14/2017 0459  GLUCOSE 87 07/08/2016 1052   BUN 40 (H) 11/14/2017 0459   BUN 42.7 (H) 07/08/2016 1052   CREATININE 7.70 (H) 11/14/2017 0459   CREATININE 8.7 (HH) 07/08/2016 1052   CALCIUM 9.4 11/14/2017 0459   CALCIUM 9.6 07/08/2016 1052   PROT 8.4 (H) 11/14/2017 0459   PROT 7.4 12/30/2016 1005   PROT 6.7 07/08/2016 1052   ALBUMIN 4.3 11/14/2017 0459   ALBUMIN 4.4 12/30/2016 1005   ALBUMIN 3.1 (L) 07/08/2016 1052   AST 33  11/14/2017 0459   AST 24 12/30/2016 1005   AST 15 07/08/2016 1052   ALT 29 11/14/2017 0459   ALT 22 12/30/2016 1005   ALT 14 07/08/2016 1052   ALKPHOS 60 11/14/2017 0459   ALKPHOS 72 12/30/2016 1005   ALKPHOS 62 07/08/2016 1052   BILITOT 0.2 (L) 11/14/2017 0459   BILITOT 0.3 12/30/2016 1005   BILITOT 0.41 07/08/2016 1052   GFRNONAA 6 (L) 11/14/2017 0459   GFRAA 7 (L) 11/14/2017 0459    No results found for: SPEP, UPEP  Lab Results  Component Value Date   WBC 6.6 08/24/2018   NEUTROABS 2.7 08/24/2018   HGB 11.9 (L) 08/24/2018   HCT 37.9 (L) 08/24/2018   MCV 101.9 (H) 08/24/2018   PLT 170 08/24/2018      Chemistry      Component Value Date/Time   NA 139 11/14/2017 0459   NA 138 07/08/2016 1052   K 5.4 (H) 11/14/2017 0459   K 4.3 07/08/2016 1052   CL 96 (L) 11/14/2017 0459   CO2 29 11/14/2017 0459   CO2 29 07/08/2016 1052   BUN 40 (H) 11/14/2017 0459   BUN 42.7 (H) 07/08/2016 1052   CREATININE 7.70 (H) 11/14/2017 0459   CREATININE 8.7 (HH) 07/08/2016 1052      Component Value Date/Time   CALCIUM 9.4 11/14/2017 0459   CALCIUM 9.6 07/08/2016 1052   ALKPHOS 60 11/14/2017 0459   ALKPHOS 72 12/30/2016 1005   ALKPHOS 62 07/08/2016 1052   AST 33 11/14/2017 0459   AST 24 12/30/2016 1005   AST 15 07/08/2016 1052   ALT 29 11/14/2017 0459   ALT 22 12/30/2016 1005   ALT 14 07/08/2016 1052   BILITOT 0.2 (L) 11/14/2017 0459   BILITOT 0.3 12/30/2016 1005   BILITOT 0.41 07/08/2016 1052      All questions were answered. The patient knows to call the clinic with any problems, questions or concerns. No barriers to learning was detected.  I spent 10 minutes counseling the patient face to face. The total time spent in the appointment was 15 minutes and more than 50% was on counseling and review of test results  Heath Lark, MD 08/25/2018 7:53 AM

## 2018-08-25 NOTE — Assessment & Plan Note (Signed)
Last CT scan showed no evidence of cancer recurrence Clinically, he has no signs of cancer recurrence I plan to see him back in 12 months with history and physical examination The patient is educated to watch for signs and symptoms of cancer recurrence We discussed the importance of annual influenza vaccination

## 2018-08-27 DIAGNOSIS — N186 End stage renal disease: Secondary | ICD-10-CM | POA: Diagnosis not present

## 2018-08-27 DIAGNOSIS — Z992 Dependence on renal dialysis: Secondary | ICD-10-CM | POA: Diagnosis not present

## 2018-08-27 DIAGNOSIS — N2581 Secondary hyperparathyroidism of renal origin: Secondary | ICD-10-CM | POA: Diagnosis not present

## 2018-08-27 DIAGNOSIS — D631 Anemia in chronic kidney disease: Secondary | ICD-10-CM | POA: Diagnosis not present

## 2018-08-27 DIAGNOSIS — D509 Iron deficiency anemia, unspecified: Secondary | ICD-10-CM | POA: Diagnosis not present

## 2018-08-27 DIAGNOSIS — E119 Type 2 diabetes mellitus without complications: Secondary | ICD-10-CM | POA: Diagnosis not present

## 2018-08-27 NOTE — Pre-Procedure Instructions (Addendum)
Javier Jenkins  08/27/2018    Your procedure is scheduled on Friday, September 04, 2018 at 9:30 AM.   Report to [facility] Entrance "A" Admitting Office at 7:30 AM.   Call this number if you have problems the morning of surgery: [phone]   Questions prior to day of surgery, please call [phone] between 8 & 4 PM.   Remember:  Do not eat or drink after midnight Thursday, 09/03/18.  Take these medicines the morning of surgery with A SIP OF WATER: Aspirin, Clonidine (Catapres), Levetiracetam (Keppra), Levothyroxine (Synthroid), Metoprolol (Lopressor)  Stop Multivitamins 7 days prior to surgery. Do not use NSAIDS (Ibuprofen, Naprosyn, Aleve, etc) or Herbal medications prior to surgery.   How to Manage Your Diabetes Before Surgery   Why is it important to control my blood sugar before and after surgery?   Improving blood sugar levels before and after surgery helps healing and can limit problems.  A way of improving blood sugar control is eating a healthy diet by:  - Eating less sugar and carbohydrates  - Increasing activity/exercise  - Talk with your doctor about reaching your blood sugar goals  High blood sugars (greater than 180 mg/dL) can raise your risk of infections and slow down your recovery so you will need to focus on controlling your diabetes during the weeks before surgery.  Make sure that the doctor who takes care of your diabetes knows about your planned surgery including the date and location.  How do I manage my blood sugars before surgery?   Check your blood sugar at least 4 times a day, 2 days before surgery to make sure that they are not too high or low.  Check your blood sugar the morning of your surgery when you wake up and every 2 hours until you get to the Short-Stay unit.  Treat a low blood sugar (less than 70 mg/dL) with 1/2 cup of clear juice (cranberry or apple), 4 glucose tablets, OR glucose gel.  Recheck blood sugar in 15 minutes after  treatment (to make sure it is greater than 70 mg/dL).  If blood sugar is not greater than 70 mg/dL on re-check, call [phone] for further instructions.   Report your blood sugar to the Short-Stay nurse when you get to Short-Stay.  References:  [facility], 2007 "How to Manage your Diabetes Before and After Surgery".    Do not wear jewelry.  Do not wear lotions, powders, cologne or deodorant.  Men may shave face and neck.  Do not bring valuables to the hospital.  [facility] is not responsible for any belongings or valuables.  Contacts, dentures or bridgework may not be worn into surgery.  Leave your suitcase in the car.  After surgery it may be brought to your room.  For patients admitted to the hospital, discharge time will be determined by your treatment team.  Patients discharged the day of surgery will not be allowed to drive home.   [facility] - Preparing for Surgery  Before surgery, you can play an important role.  Because skin is not sterile, your skin needs to be as free of germs as possible.  You can reduce the number of germs on you skin by washing with CHG (chlorahexidine gluconate) soap before surgery.  CHG is an antiseptic cleaner which kills germs and bonds with the skin to continue killing germs even after washing.  Oral Hygiene is also important in reducing the risk of infection.  Remember  to brush your teeth with your regular toothpaste the morning of surgery.  Please DO NOT use if you have an allergy to CHG or antibacterial soaps.  If your skin becomes reddened/irritated stop using the CHG and inform your nurse when you arrive at Short Stay.  Do not shave (including legs and underarms) for at least 48 hours prior to the first CHG shower.  You may shave your face.  Please follow these instructions carefully:   1.  Shower with CHG Soap the night before surgery and the morning of Surgery.  2.  If you choose to wash your hair, wash your hair  first as usual with your normal shampoo.  3.  After you shampoo, rinse your hair and body thoroughly to remove the shampoo. 4.  Use CHG as you would any other liquid soap.  You can apply chg directly to the skin and wash gently with a      scrungie or washcloth.           5.  Apply the CHG Soap to your body ONLY FROM THE NECK DOWN.   Do not use on open wounds or open sores. Avoid contact with your eyes, ears, mouth and genitals (private parts).  Wash genitals (private parts) with your normal soap - do this prior to using CHG soap.  6.  Wash thoroughly, paying special attention to the area where your surgery will be performed.  7.  Thoroughly rinse your body with warm water from the neck down.  8.  DO NOT shower/wash with your normal soap after using and rinsing off the CHG Soap.  9.  Pat yourself dry with a clean towel.            10.  Wear clean pajamas.            11.  Place clean sheets on your bed the night of your first shower and do not sleep with pets.  Day of Surgery  Shower as above. Do not apply any lotions/deodorants the morning of surgery.   Please wear clean clothes to the hospital. Remember to brush your teeth with toothpaste.   Please read over the fact sheets that you were given.

## 2018-08-28 ENCOUNTER — Other Ambulatory Visit: Payer: Self-pay

## 2018-08-28 ENCOUNTER — Encounter (HOSPITAL_COMMUNITY): Payer: Self-pay

## 2018-08-28 ENCOUNTER — Encounter (HOSPITAL_COMMUNITY)
Admission: RE | Admit: 2018-08-28 | Discharge: 2018-08-28 | Disposition: A | Discharge status: Home / Self Care | Payer: Medicare Other | Source: Ambulatory Visit | Attending: Vascular Surgery | Admitting: Vascular Surgery

## 2018-08-28 DIAGNOSIS — Z01810 Encounter for preprocedural cardiovascular examination: Secondary | ICD-10-CM | POA: Diagnosis not present

## 2018-08-28 HISTORY — DX: Anemia, unspecified: D64.9

## 2018-08-28 NOTE — Progress Notes (Signed)
PCP - Dr. [name] Cardiologist - denies  Chest x-ray - n/a EKG - today Stress Test - denies ECHO - 06/21/15 Cardiac Cath - denies  Sleep Study - n/a CPAP -   Fasting Blood Sugar - does not check sugar at home Checks Blood Sugar _____ times a day  Blood Thinner Instructions: n/a Aspirin Instructions: to continue Aspirin per Dr. Scot Dock  Anesthesia review: No  Patient denies shortness of breath, fever, cough and chest pain at PAT appointment   Patient verbalized understanding of instructions that were given to them at the PAT appointment. Patient was also instructed that they will need to review over the PAT instructions again at home before surgery.  Pt will have Covid test done on 09/01/18. Pt instructed on quarantine after getting test done. Pt voiced understanding.   Coronavirus Screening  Have you experienced the following symptoms:  Cough  NO Fever (>100.75F) NO   Runny nose NO Sore throat NO Difficulty breathing/shortness of breath  NO  Have you or a family member traveled in the last 14 days and where? NO     Patient reminded that hospital visitation restrictions are in effect and the importance of the restrictions. Informed pt that his wife can come in and sit in the waiting room, she is not allowed to go back to the pre-op area. He voiced understanding.

## 2018-08-29 DIAGNOSIS — Z992 Dependence on renal dialysis: Secondary | ICD-10-CM | POA: Diagnosis not present

## 2018-08-29 DIAGNOSIS — N2581 Secondary hyperparathyroidism of renal origin: Secondary | ICD-10-CM | POA: Diagnosis not present

## 2018-08-29 DIAGNOSIS — N186 End stage renal disease: Secondary | ICD-10-CM | POA: Diagnosis not present

## 2018-08-29 DIAGNOSIS — D631 Anemia in chronic kidney disease: Secondary | ICD-10-CM | POA: Diagnosis not present

## 2018-08-29 DIAGNOSIS — I129 Hypertensive chronic kidney disease with stage 1 through stage 4 chronic kidney disease, or unspecified chronic kidney disease: Secondary | ICD-10-CM | POA: Diagnosis not present

## 2018-08-29 DIAGNOSIS — D509 Iron deficiency anemia, unspecified: Secondary | ICD-10-CM | POA: Diagnosis not present

## 2018-09-01 ENCOUNTER — Other Ambulatory Visit (HOSPITAL_COMMUNITY)
Admission: RE | Admit: 2018-09-01 | Discharge: 2018-09-01 | Disposition: A | Discharge status: Home / Self Care | Payer: Medicare Other | Source: Ambulatory Visit | Attending: Vascular Surgery | Admitting: Vascular Surgery

## 2018-09-01 DIAGNOSIS — N2581 Secondary hyperparathyroidism of renal origin: Secondary | ICD-10-CM | POA: Diagnosis not present

## 2018-09-01 DIAGNOSIS — Z20828 Contact with and (suspected) exposure to other viral communicable diseases: Secondary | ICD-10-CM | POA: Diagnosis not present

## 2018-09-01 DIAGNOSIS — N186 End stage renal disease: Secondary | ICD-10-CM | POA: Diagnosis not present

## 2018-09-01 DIAGNOSIS — D631 Anemia in chronic kidney disease: Secondary | ICD-10-CM | POA: Diagnosis not present

## 2018-09-01 DIAGNOSIS — Z992 Dependence on renal dialysis: Secondary | ICD-10-CM | POA: Diagnosis not present

## 2018-09-01 DIAGNOSIS — D509 Iron deficiency anemia, unspecified: Secondary | ICD-10-CM | POA: Diagnosis not present

## 2018-09-01 DIAGNOSIS — Z01812 Encounter for preprocedural laboratory examination: Secondary | ICD-10-CM | POA: Insufficient documentation

## 2018-09-01 LAB — SARS CORONAVIRUS 2 (TAT 6-24 HRS): SARS Coronavirus 2: NEGATIVE

## 2018-09-03 DIAGNOSIS — N186 End stage renal disease: Secondary | ICD-10-CM | POA: Diagnosis not present

## 2018-09-03 DIAGNOSIS — D509 Iron deficiency anemia, unspecified: Secondary | ICD-10-CM | POA: Diagnosis not present

## 2018-09-03 DIAGNOSIS — D631 Anemia in chronic kidney disease: Secondary | ICD-10-CM | POA: Diagnosis not present

## 2018-09-03 DIAGNOSIS — Z992 Dependence on renal dialysis: Secondary | ICD-10-CM | POA: Diagnosis not present

## 2018-09-03 DIAGNOSIS — N2581 Secondary hyperparathyroidism of renal origin: Secondary | ICD-10-CM | POA: Diagnosis not present

## 2018-09-04 ENCOUNTER — Other Ambulatory Visit: Payer: Self-pay

## 2018-09-04 ENCOUNTER — Ambulatory Visit (HOSPITAL_COMMUNITY)
Admission: RE | Admit: 2018-09-04 | Discharge: 2018-09-04 | Disposition: A | Discharge status: Home / Self Care | Payer: Medicare Other | Attending: Vascular Surgery | Admitting: Vascular Surgery

## 2018-09-04 ENCOUNTER — Encounter (HOSPITAL_COMMUNITY): Payer: Self-pay

## 2018-09-04 ENCOUNTER — Ambulatory Visit (HOSPITAL_COMMUNITY): Payer: Medicare Other | Admitting: Anesthesiology

## 2018-09-04 ENCOUNTER — Encounter (HOSPITAL_COMMUNITY)
Admission: RE | Disposition: A | Discharge status: Home / Self Care | Payer: Self-pay | Source: Home / Self Care | Attending: Vascular Surgery

## 2018-09-04 DIAGNOSIS — Z7982 Long term (current) use of aspirin: Secondary | ICD-10-CM | POA: Diagnosis not present

## 2018-09-04 DIAGNOSIS — T82898A Other specified complication of vascular prosthetic devices, implants and grafts, initial encounter: Secondary | ICD-10-CM | POA: Diagnosis not present

## 2018-09-04 DIAGNOSIS — N185 Chronic kidney disease, stage 5: Secondary | ICD-10-CM | POA: Diagnosis not present

## 2018-09-04 DIAGNOSIS — Z87891 Personal history of nicotine dependence: Secondary | ICD-10-CM | POA: Insufficient documentation

## 2018-09-04 DIAGNOSIS — D631 Anemia in chronic kidney disease: Secondary | ICD-10-CM | POA: Diagnosis not present

## 2018-09-04 DIAGNOSIS — E1122 Type 2 diabetes mellitus with diabetic chronic kidney disease: Secondary | ICD-10-CM | POA: Diagnosis not present

## 2018-09-04 DIAGNOSIS — N186 End stage renal disease: Secondary | ICD-10-CM | POA: Diagnosis not present

## 2018-09-04 DIAGNOSIS — I69351 Hemiplegia and hemiparesis following cerebral infarction affecting right dominant side: Secondary | ICD-10-CM | POA: Diagnosis not present

## 2018-09-04 DIAGNOSIS — Z992 Dependence on renal dialysis: Secondary | ICD-10-CM | POA: Diagnosis not present

## 2018-09-04 DIAGNOSIS — L98499 Non-pressure chronic ulcer of skin of other sites with unspecified severity: Secondary | ICD-10-CM | POA: Diagnosis not present

## 2018-09-04 DIAGNOSIS — Z79899 Other long term (current) drug therapy: Secondary | ICD-10-CM | POA: Insufficient documentation

## 2018-09-04 DIAGNOSIS — I10 Essential (primary) hypertension: Secondary | ICD-10-CM | POA: Diagnosis not present

## 2018-09-04 DIAGNOSIS — I120 Hypertensive chronic kidney disease with stage 5 chronic kidney disease or end stage renal disease: Secondary | ICD-10-CM | POA: Insufficient documentation

## 2018-09-04 DIAGNOSIS — Z79890 Hormone replacement therapy (postmenopausal): Secondary | ICD-10-CM | POA: Diagnosis not present

## 2018-09-04 DIAGNOSIS — Z8572 Personal history of non-Hodgkin lymphomas: Secondary | ICD-10-CM | POA: Insufficient documentation

## 2018-09-04 DIAGNOSIS — E039 Hypothyroidism, unspecified: Secondary | ICD-10-CM | POA: Diagnosis not present

## 2018-09-04 HISTORY — PX: REVISON OF ARTERIOVENOUS FISTULA: SHX6074

## 2018-09-04 LAB — GLUCOSE, CAPILLARY
Glucose-Capillary: 102 mg/dL — ABNORMAL HIGH (ref 70–99)
Glucose-Capillary: 76 mg/dL (ref 70–99)
Glucose-Capillary: 73 mg/dL (ref 70–99)
Glucose-Capillary: 78 mg/dL (ref 70–99)

## 2018-09-04 LAB — POCT I-STAT 4, (NA,K, GLUC, HGB,HCT)
Glucose, Bld: 105 mg/dL — ABNORMAL HIGH (ref 70–99)
HCT: 38 % — ABNORMAL LOW (ref 39.0–52.0)
Hemoglobin: 12.9 g/dL — ABNORMAL LOW (ref 13.0–17.0)
Potassium: 4.8 mmol/L (ref 3.5–5.1)
Sodium: 135 mmol/L (ref 135–145)

## 2018-09-04 SURGERY — REVISON OF ARTERIOVENOUS FISTULA
Anesthesia: General | Site: Arm Upper | Laterality: Left

## 2018-09-04 MED ORDER — PROMETHAZINE HCL 25 MG/ML IJ SOLN: 6.2500 mg | INTRAMUSCULAR | Status: DC | PRN

## 2018-09-04 MED ORDER — SODIUM CHLORIDE 0.9 % IV SOLN
INTRAVENOUS | Status: DC | PRN
  Administered 2018-09-04: 25 ug/min via INTRAVENOUS

## 2018-09-04 MED ORDER — FENTANYL CITRATE (PF) 100 MCG/2ML IJ SOLN: 25.0000 ug | INTRAMUSCULAR | Status: DC | PRN

## 2018-09-04 MED ORDER — PROPOFOL 1000 MG/100ML IV EMUL
INTRAVENOUS | Status: AC
  Filled 2018-09-04: qty 100

## 2018-09-04 MED ORDER — OXYCODONE HCL 5 MG PO TABS: 5.0000 mg | ORAL_TABLET | ORAL | 0 refills | Status: DC | PRN

## 2018-09-04 MED ORDER — ONDANSETRON HCL 4 MG/2ML IJ SOLN
INTRAMUSCULAR | Status: DC | PRN
  Administered 2018-09-04: 4 mg via INTRAVENOUS

## 2018-09-04 MED ORDER — LIDOCAINE HCL (PF) 1 % IJ SOLN
INTRAMUSCULAR | Status: AC
  Filled 2018-09-04: qty 30

## 2018-09-04 MED ORDER — SODIUM CHLORIDE 0.9 % IV SOLN
INTRAVENOUS | Status: AC
  Filled 2018-09-04: qty 1.2

## 2018-09-04 MED ORDER — SODIUM CHLORIDE 0.9 % IV SOLN
INTRAVENOUS | Status: DC
  Administered 2018-09-04: 08:00:00 via INTRAVENOUS

## 2018-09-04 MED ORDER — SODIUM CHLORIDE 0.9 % IR SOLN
Status: DC | PRN
  Administered 2018-09-04: 1000 mL

## 2018-09-04 MED ORDER — PROPOFOL 10 MG/ML IV BOLUS
INTRAVENOUS | Status: AC
  Filled 2018-09-04: qty 20

## 2018-09-04 MED ORDER — MIDAZOLAM HCL 2 MG/2ML IJ SOLN
INTRAMUSCULAR | Status: DC | PRN
  Administered 2018-09-04: 2 mg via INTRAVENOUS

## 2018-09-04 MED ORDER — PROPOFOL 500 MG/50ML IV EMUL
INTRAVENOUS | Status: DC | PRN
  Administered 2018-09-04: 50 ug/kg/min via INTRAVENOUS

## 2018-09-04 MED ORDER — LIDOCAINE-EPINEPHRINE (PF) 1 %-1:200000 IJ SOLN
INTRAMUSCULAR | Status: AC
  Filled 2018-09-04: qty 30

## 2018-09-04 MED ORDER — CEFAZOLIN SODIUM-DEXTROSE 2-4 GM/100ML-% IV SOLN
2.0000 g | INTRAVENOUS | Status: AC
  Administered 2018-09-04: 2 g via INTRAVENOUS
  Filled 2018-09-04: qty 100

## 2018-09-04 MED ORDER — PHENYLEPHRINE 40 MCG/ML (10ML) SYRINGE FOR IV PUSH (FOR BLOOD PRESSURE SUPPORT)
PREFILLED_SYRINGE | INTRAVENOUS | Status: DC | PRN
  Administered 2018-09-04: 80 ug via INTRAVENOUS
  Administered 2018-09-04 (×2): 120 ug via INTRAVENOUS

## 2018-09-04 MED ORDER — FENTANYL CITRATE (PF) 100 MCG/2ML IJ SOLN
INTRAMUSCULAR | Status: DC | PRN
  Administered 2018-09-04: 25 ug via INTRAVENOUS

## 2018-09-04 MED ORDER — LIDOCAINE HCL (PF) 1 % IJ SOLN
INTRAMUSCULAR | Status: DC | PRN
  Administered 2018-09-04: 30 mL

## 2018-09-04 MED ORDER — MIDAZOLAM HCL 2 MG/2ML IJ SOLN
INTRAMUSCULAR | Status: AC
  Filled 2018-09-04: qty 2

## 2018-09-04 MED ORDER — FENTANYL CITRATE (PF) 250 MCG/5ML IJ SOLN
INTRAMUSCULAR | Status: AC
  Filled 2018-09-04: qty 5

## 2018-09-04 SURGICAL SUPPLY — 37 items
ARMBAND PINK RESTRICT EXTREMIT (MISCELLANEOUS) ×2 IMPLANT
CANISTER SUCT 3000ML PPV (MISCELLANEOUS) ×2 IMPLANT
CANNULA VESSEL 3MM 2 BLNT TIP (CANNULA) ×2 IMPLANT
CLIP VESOCCLUDE MED 6/CT (CLIP) ×2 IMPLANT
CLIP VESOCCLUDE SM WIDE 6/CT (CLIP) ×2 IMPLANT
COVER PROBE W GEL 5X96 (DRAPES) IMPLANT
COVER WAND RF STERILE (DRAPES) ×2 IMPLANT
DERMABOND ADVANCED (GAUZE/BANDAGES/DRESSINGS) ×1
DERMABOND ADVANCED .7 DNX12 (GAUZE/BANDAGES/DRESSINGS) ×1 IMPLANT
ELECT REM PT RETURN 9FT ADLT (ELECTROSURGICAL) ×2
ELECTRODE REM PT RTRN 9FT ADLT (ELECTROSURGICAL) ×1 IMPLANT
GLOVE BIO SURGEON STRL SZ7 (GLOVE) ×2 IMPLANT
GLOVE BIO SURGEON STRL SZ7.5 (GLOVE) ×2 IMPLANT
GLOVE BIOGEL PI IND STRL 8 (GLOVE) ×1 IMPLANT
GLOVE BIOGEL PI IND STRL 8.5 (GLOVE) IMPLANT
GLOVE BIOGEL PI INDICATOR 8 (GLOVE) ×1
GLOVE BIOGEL PI INDICATOR 8.5 (GLOVE) ×1
GLOVE SS BIOGEL STRL SZ 8 (GLOVE) IMPLANT
GLOVE SUPERSENSE BIOGEL SZ 8 (GLOVE) ×1
GLOVE SURG SS PI 6.5 STRL IVOR (GLOVE) ×1 IMPLANT
GOWN STRL REUS W/ TWL LRG LVL3 (GOWN DISPOSABLE) ×3 IMPLANT
GOWN STRL REUS W/ TWL XL LVL3 (GOWN DISPOSABLE) IMPLANT
GOWN STRL REUS W/TWL LRG LVL3 (GOWN DISPOSABLE) ×3
GOWN STRL REUS W/TWL XL LVL3 (GOWN DISPOSABLE) ×1
KIT BASIN OR (CUSTOM PROCEDURE TRAY) ×2 IMPLANT
KIT TURNOVER KIT B (KITS) ×2 IMPLANT
NS IRRIG 1000ML POUR BTL (IV SOLUTION) ×2 IMPLANT
PACK CV ACCESS (CUSTOM PROCEDURE TRAY) ×2 IMPLANT
PAD ARMBOARD 7.5X6 YLW CONV (MISCELLANEOUS) ×4 IMPLANT
SPONGE SURGIFOAM ABS GEL 100 (HEMOSTASIS) IMPLANT
SUT PROLENE 6 0 BV (SUTURE) ×2 IMPLANT
SUT VIC AB 3-0 SH 27 (SUTURE) ×1
SUT VIC AB 3-0 SH 27X BRD (SUTURE) ×1 IMPLANT
SUT VICRYL 4-0 PS2 18IN ABS (SUTURE) ×2 IMPLANT
TOWEL GREEN STERILE (TOWEL DISPOSABLE) ×2 IMPLANT
UNDERPAD 30X30 (UNDERPADS AND DIAPERS) ×2 IMPLANT
WATER STERILE IRR 1000ML POUR (IV SOLUTION) ×2 IMPLANT

## 2018-09-04 NOTE — Transfer of Care (Signed)
Immediate Anesthesia Transfer of Care Note  Patient: Javier Jenkins  Procedure(s) Performed: EXCISION OF ULCER ON LEFT ARM ARTERIOVENOUS FISTULA (Left Arm Upper)  Patient Location: PACU  Anesthesia Type:MAC  Level of Consciousness: awake, alert  and oriented  Airway & Oxygen Therapy: Patient Spontanous Breathing  Post-op Assessment: Report given to RN  Post vital signs: Reviewed and stable  Last Vitals:  Vitals Value Taken Time  BP 112/72 09/04/18 1258  Temp    Pulse 52 09/04/18 1258  Resp 16 09/04/18 1258  SpO2 100 % 09/04/18 1258    Last Pain:  Vitals:   09/04/18 0807  TempSrc:   PainSc: 0-No pain         Complications: No apparent anesthesia complications

## 2018-09-04 NOTE — Anesthesia Procedure Notes (Signed)
Procedure Name: MAC Date/Time: 09/04/2018 12:08 PM Performed by: Myrtie Soman, MD Pre-anesthesia Checklist: Patient identified, Suction available, Patient being monitored, Timeout performed and Emergency Drugs available Patient Re-evaluated:Patient Re-evaluated prior to induction Oxygen Delivery Method: Simple face mask Preoxygenation: Pre-oxygenation with 100% oxygen Induction Type: IV induction Dental Injury: Teeth and Oropharynx as per pre-operative assessment

## 2018-09-04 NOTE — Anesthesia Postprocedure Evaluation (Signed)
Anesthesia Post Note  Patient: Javier Jenkins  Procedure(s) Performed: EXCISION OF ULCER ON LEFT ARM ARTERIOVENOUS FISTULA (Left Arm Upper)     Patient location during evaluation: PACU Anesthesia Type: MAC Level of consciousness: awake and alert Pain management: pain level controlled Vital Signs Assessment: post-procedure vital signs reviewed and stable Respiratory status: spontaneous breathing, nonlabored ventilation, respiratory function stable and patient connected to nasal cannula oxygen Cardiovascular status: stable and blood pressure returned to baseline Postop Assessment: no apparent nausea or vomiting Anesthetic complications: no    Last Vitals:  Vitals:   09/04/18 1258 09/04/18 1313  BP: 112/72 121/74  Pulse: (!) 52 (!) 53  Resp: 16 16  Temp: 36.4 C 36.4 C  SpO2: 100% 99%    Last Pain:  Vitals:   09/04/18 1313  TempSrc:   PainSc: 0-No pain                 Tyrome Donatelli S

## 2018-09-04 NOTE — Interval H&P Note (Signed)
History and Physical Interval Note:  09/04/2018 11:05 AM  Javier Jenkins  has presented today for surgery, with the diagnosis of ulcer on left arm fistula.  The various methods of treatment have been discussed with the patient and family. After consideration of risks, benefits and other options for treatment, the patient has consented to  Procedure(s): EXCISION OF ULCER ON LEFT ARM ARTERIOVENOUS FISTULA (Left) as a surgical intervention.  The patient's history has been reviewed, patient examined, no change in status, stable for surgery.  I have reviewed the patient's chart and labs.  Questions were answered to the patient's satisfaction.     [name]

## 2018-09-04 NOTE — Op Note (Signed)
    NAME: [name]    MRN: [id number] DOB: 08-14-1950    DATE OF OPERATION: 09/04/2018  PREOP DIAGNOSIS:    Ulcer overlying left upper arm fistula  POSTOP DIAGNOSIS:    Same  PROCEDURE:    Excision of ulcer overlying left upper arm fistula  SURGEON: Judeth Cornfield. Scot Dock, MD, FACS  ASSIST: Linus Orn, SA  ANESTHESIA: Local with sedation  EBL: Minimal   INDICATIONS:    Javier Jenkins is a 68 y.o. male who has a left upper arm fistula.  He presented with an ulceration overlying the fistula and presents for excision of the ulcer.  FINDINGS:   Good thrill at the completion of the procedure.  TECHNIQUE:   The patient was taken to the operating room and sedated by anesthesia.  The left upper extremity was prepped and draped in usual sterile fashion.  After the skin was anesthetized an elliptical incision was made over the ulcer and I undermined the skin overlying the aneurysmal segment in both directions.  There was not a defect in the fistula itself.  There was enough tissue to close over the top of the fistula.  This was done with a 4-0 Vicryl.  Dermabond was applied.  The patient tolerated the procedure well was transferred to the recovery room in stable condition.  All needle and sponge counts were correct.  [name], MD, FACS [facility]  DATE OF DICTATION:   09/04/2018

## 2018-09-04 NOTE — Anesthesia Preprocedure Evaluation (Addendum)
Anesthesia Evaluation  Patient identified by MRN, date of birth, ID band Patient awake    Reviewed: Allergy & Precautions, NPO status , Patient's Chart, lab work & pertinent test results  Airway Mallampati: II  TM Distance: >3 FB Neck ROM: Limited    Dental no notable dental hx.    Pulmonary neg pulmonary ROS, former smoker,    Pulmonary exam normal breath sounds clear to auscultation       Cardiovascular hypertension, Pt. on medications and Pt. on home beta blockers Normal cardiovascular exam Rhythm:Regular Rate:Normal     Neuro/Psych CVA, Residual Symptoms negative psych ROS   GI/Hepatic negative GI ROS, Neg liver ROS,   Endo/Other  diabetesHypothyroidism   Renal/GU DialysisRenal disease  negative genitourinary   Musculoskeletal negative musculoskeletal ROS (+)   Abdominal   Peds negative pediatric ROS (+)  Hematology negative hematology ROS (+)   Anesthesia Other Findings   Reproductive/Obstetrics negative OB ROS                             Anesthesia Physical Anesthesia Plan  ASA: III  Anesthesia Plan: MAC   Post-op Pain Management:    Induction: Intravenous  PONV Risk Score and Plan: 1 and Ondansetron and Treatment may vary due to age or medical condition  Airway Management Planned: Simple Face Mask  Additional Equipment:   Intra-op Plan:   Post-operative Plan:   Informed Consent: I have reviewed the patients History and Physical, chart, labs and discussed the procedure including the risks, benefits and alternatives for the proposed anesthesia with the patient or authorized representative who has indicated his/her understanding and acceptance.     Dental advisory given  Plan Discussed with: CRNA and Surgeon  Anesthesia Plan Comments:        Anesthesia Quick Evaluation

## 2018-09-05 ENCOUNTER — Encounter (HOSPITAL_COMMUNITY): Payer: Self-pay | Admitting: Vascular Surgery

## 2018-09-05 DIAGNOSIS — N186 End stage renal disease: Secondary | ICD-10-CM | POA: Diagnosis not present

## 2018-09-05 DIAGNOSIS — Z992 Dependence on renal dialysis: Secondary | ICD-10-CM | POA: Diagnosis not present

## 2018-09-05 DIAGNOSIS — D509 Iron deficiency anemia, unspecified: Secondary | ICD-10-CM | POA: Diagnosis not present

## 2018-09-05 DIAGNOSIS — D631 Anemia in chronic kidney disease: Secondary | ICD-10-CM | POA: Diagnosis not present

## 2018-09-05 DIAGNOSIS — N2581 Secondary hyperparathyroidism of renal origin: Secondary | ICD-10-CM | POA: Diagnosis not present

## 2018-09-07 DIAGNOSIS — S42201D Unspecified fracture of upper end of right humerus, subsequent encounter for fracture with routine healing: Secondary | ICD-10-CM | POA: Diagnosis not present

## 2018-09-08 DIAGNOSIS — D631 Anemia in chronic kidney disease: Secondary | ICD-10-CM | POA: Diagnosis not present

## 2018-09-08 DIAGNOSIS — D509 Iron deficiency anemia, unspecified: Secondary | ICD-10-CM | POA: Diagnosis not present

## 2018-09-08 DIAGNOSIS — N2581 Secondary hyperparathyroidism of renal origin: Secondary | ICD-10-CM | POA: Diagnosis not present

## 2018-09-08 DIAGNOSIS — Z992 Dependence on renal dialysis: Secondary | ICD-10-CM | POA: Diagnosis not present

## 2018-09-08 DIAGNOSIS — N186 End stage renal disease: Secondary | ICD-10-CM | POA: Diagnosis not present

## 2018-09-10 DIAGNOSIS — N2581 Secondary hyperparathyroidism of renal origin: Secondary | ICD-10-CM | POA: Diagnosis not present

## 2018-09-10 DIAGNOSIS — Z992 Dependence on renal dialysis: Secondary | ICD-10-CM | POA: Diagnosis not present

## 2018-09-10 DIAGNOSIS — D509 Iron deficiency anemia, unspecified: Secondary | ICD-10-CM | POA: Diagnosis not present

## 2018-09-10 DIAGNOSIS — N186 End stage renal disease: Secondary | ICD-10-CM | POA: Diagnosis not present

## 2018-09-10 DIAGNOSIS — D631 Anemia in chronic kidney disease: Secondary | ICD-10-CM | POA: Diagnosis not present

## 2018-09-12 DIAGNOSIS — D631 Anemia in chronic kidney disease: Secondary | ICD-10-CM | POA: Diagnosis not present

## 2018-09-12 DIAGNOSIS — N2581 Secondary hyperparathyroidism of renal origin: Secondary | ICD-10-CM | POA: Diagnosis not present

## 2018-09-12 DIAGNOSIS — D509 Iron deficiency anemia, unspecified: Secondary | ICD-10-CM | POA: Diagnosis not present

## 2018-09-12 DIAGNOSIS — Z992 Dependence on renal dialysis: Secondary | ICD-10-CM | POA: Diagnosis not present

## 2018-09-12 DIAGNOSIS — N186 End stage renal disease: Secondary | ICD-10-CM | POA: Diagnosis not present

## 2018-09-15 DIAGNOSIS — N2581 Secondary hyperparathyroidism of renal origin: Secondary | ICD-10-CM | POA: Diagnosis not present

## 2018-09-15 DIAGNOSIS — D631 Anemia in chronic kidney disease: Secondary | ICD-10-CM | POA: Diagnosis not present

## 2018-09-15 DIAGNOSIS — D509 Iron deficiency anemia, unspecified: Secondary | ICD-10-CM | POA: Diagnosis not present

## 2018-09-15 DIAGNOSIS — Z992 Dependence on renal dialysis: Secondary | ICD-10-CM | POA: Diagnosis not present

## 2018-09-15 DIAGNOSIS — N186 End stage renal disease: Secondary | ICD-10-CM | POA: Diagnosis not present

## 2018-09-17 DIAGNOSIS — N186 End stage renal disease: Secondary | ICD-10-CM | POA: Diagnosis not present

## 2018-09-17 DIAGNOSIS — N2581 Secondary hyperparathyroidism of renal origin: Secondary | ICD-10-CM | POA: Diagnosis not present

## 2018-09-17 DIAGNOSIS — D631 Anemia in chronic kidney disease: Secondary | ICD-10-CM | POA: Diagnosis not present

## 2018-09-17 DIAGNOSIS — D509 Iron deficiency anemia, unspecified: Secondary | ICD-10-CM | POA: Diagnosis not present

## 2018-09-17 DIAGNOSIS — Z992 Dependence on renal dialysis: Secondary | ICD-10-CM | POA: Diagnosis not present

## 2018-09-19 DIAGNOSIS — D509 Iron deficiency anemia, unspecified: Secondary | ICD-10-CM | POA: Diagnosis not present

## 2018-09-19 DIAGNOSIS — N2581 Secondary hyperparathyroidism of renal origin: Secondary | ICD-10-CM | POA: Diagnosis not present

## 2018-09-19 DIAGNOSIS — Z992 Dependence on renal dialysis: Secondary | ICD-10-CM | POA: Diagnosis not present

## 2018-09-19 DIAGNOSIS — N186 End stage renal disease: Secondary | ICD-10-CM | POA: Diagnosis not present

## 2018-09-19 DIAGNOSIS — D631 Anemia in chronic kidney disease: Secondary | ICD-10-CM | POA: Diagnosis not present

## 2018-09-22 DIAGNOSIS — N186 End stage renal disease: Secondary | ICD-10-CM | POA: Diagnosis not present

## 2018-09-22 DIAGNOSIS — D631 Anemia in chronic kidney disease: Secondary | ICD-10-CM | POA: Diagnosis not present

## 2018-09-22 DIAGNOSIS — D509 Iron deficiency anemia, unspecified: Secondary | ICD-10-CM | POA: Diagnosis not present

## 2018-09-22 DIAGNOSIS — Z992 Dependence on renal dialysis: Secondary | ICD-10-CM | POA: Diagnosis not present

## 2018-09-22 DIAGNOSIS — N2581 Secondary hyperparathyroidism of renal origin: Secondary | ICD-10-CM | POA: Diagnosis not present

## 2018-09-24 DIAGNOSIS — D509 Iron deficiency anemia, unspecified: Secondary | ICD-10-CM | POA: Diagnosis not present

## 2018-09-24 DIAGNOSIS — D631 Anemia in chronic kidney disease: Secondary | ICD-10-CM | POA: Diagnosis not present

## 2018-09-24 DIAGNOSIS — N2581 Secondary hyperparathyroidism of renal origin: Secondary | ICD-10-CM | POA: Diagnosis not present

## 2018-09-24 DIAGNOSIS — N186 End stage renal disease: Secondary | ICD-10-CM | POA: Diagnosis not present

## 2018-09-24 DIAGNOSIS — Z992 Dependence on renal dialysis: Secondary | ICD-10-CM | POA: Diagnosis not present

## 2018-09-26 ENCOUNTER — Encounter: Payer: Self-pay | Admitting: Nurse Practitioner

## 2018-09-26 DIAGNOSIS — N186 End stage renal disease: Secondary | ICD-10-CM | POA: Diagnosis not present

## 2018-09-26 DIAGNOSIS — N2581 Secondary hyperparathyroidism of renal origin: Secondary | ICD-10-CM | POA: Diagnosis not present

## 2018-09-26 DIAGNOSIS — D631 Anemia in chronic kidney disease: Secondary | ICD-10-CM | POA: Diagnosis not present

## 2018-09-26 DIAGNOSIS — D509 Iron deficiency anemia, unspecified: Secondary | ICD-10-CM | POA: Diagnosis not present

## 2018-09-26 DIAGNOSIS — Z992 Dependence on renal dialysis: Secondary | ICD-10-CM | POA: Diagnosis not present

## 2018-09-29 DIAGNOSIS — N186 End stage renal disease: Secondary | ICD-10-CM | POA: Diagnosis not present

## 2018-09-29 DIAGNOSIS — N2581 Secondary hyperparathyroidism of renal origin: Secondary | ICD-10-CM | POA: Diagnosis not present

## 2018-09-29 DIAGNOSIS — Z992 Dependence on renal dialysis: Secondary | ICD-10-CM | POA: Diagnosis not present

## 2018-09-29 DIAGNOSIS — Z23 Encounter for immunization: Secondary | ICD-10-CM | POA: Diagnosis not present

## 2018-09-29 DIAGNOSIS — I129 Hypertensive chronic kidney disease with stage 1 through stage 4 chronic kidney disease, or unspecified chronic kidney disease: Secondary | ICD-10-CM | POA: Diagnosis not present

## 2018-10-01 DIAGNOSIS — Z23 Encounter for immunization: Secondary | ICD-10-CM | POA: Diagnosis not present

## 2018-10-01 DIAGNOSIS — N2581 Secondary hyperparathyroidism of renal origin: Secondary | ICD-10-CM | POA: Diagnosis not present

## 2018-10-01 DIAGNOSIS — N186 End stage renal disease: Secondary | ICD-10-CM | POA: Diagnosis not present

## 2018-10-01 DIAGNOSIS — Z992 Dependence on renal dialysis: Secondary | ICD-10-CM | POA: Diagnosis not present

## 2018-10-03 DIAGNOSIS — N2581 Secondary hyperparathyroidism of renal origin: Secondary | ICD-10-CM | POA: Diagnosis not present

## 2018-10-03 DIAGNOSIS — N186 End stage renal disease: Secondary | ICD-10-CM | POA: Diagnosis not present

## 2018-10-03 DIAGNOSIS — Z992 Dependence on renal dialysis: Secondary | ICD-10-CM | POA: Diagnosis not present

## 2018-10-03 DIAGNOSIS — Z23 Encounter for immunization: Secondary | ICD-10-CM | POA: Diagnosis not present

## 2018-10-06 DIAGNOSIS — Z992 Dependence on renal dialysis: Secondary | ICD-10-CM | POA: Diagnosis not present

## 2018-10-06 DIAGNOSIS — Z23 Encounter for immunization: Secondary | ICD-10-CM | POA: Diagnosis not present

## 2018-10-06 DIAGNOSIS — N186 End stage renal disease: Secondary | ICD-10-CM | POA: Diagnosis not present

## 2018-10-06 DIAGNOSIS — N2581 Secondary hyperparathyroidism of renal origin: Secondary | ICD-10-CM | POA: Diagnosis not present

## 2018-10-08 DIAGNOSIS — N2581 Secondary hyperparathyroidism of renal origin: Secondary | ICD-10-CM | POA: Diagnosis not present

## 2018-10-08 DIAGNOSIS — N186 End stage renal disease: Secondary | ICD-10-CM | POA: Diagnosis not present

## 2018-10-08 DIAGNOSIS — Z23 Encounter for immunization: Secondary | ICD-10-CM | POA: Diagnosis not present

## 2018-10-08 DIAGNOSIS — Z992 Dependence on renal dialysis: Secondary | ICD-10-CM | POA: Diagnosis not present

## 2018-10-10 DIAGNOSIS — N2581 Secondary hyperparathyroidism of renal origin: Secondary | ICD-10-CM | POA: Diagnosis not present

## 2018-10-10 DIAGNOSIS — N186 End stage renal disease: Secondary | ICD-10-CM | POA: Diagnosis not present

## 2018-10-10 DIAGNOSIS — Z23 Encounter for immunization: Secondary | ICD-10-CM | POA: Diagnosis not present

## 2018-10-10 DIAGNOSIS — Z992 Dependence on renal dialysis: Secondary | ICD-10-CM | POA: Diagnosis not present

## 2018-10-13 DIAGNOSIS — N186 End stage renal disease: Secondary | ICD-10-CM | POA: Diagnosis not present

## 2018-10-13 DIAGNOSIS — Z992 Dependence on renal dialysis: Secondary | ICD-10-CM | POA: Diagnosis not present

## 2018-10-13 DIAGNOSIS — Z23 Encounter for immunization: Secondary | ICD-10-CM | POA: Diagnosis not present

## 2018-10-13 DIAGNOSIS — N2581 Secondary hyperparathyroidism of renal origin: Secondary | ICD-10-CM | POA: Diagnosis not present

## 2018-10-15 DIAGNOSIS — Z23 Encounter for immunization: Secondary | ICD-10-CM | POA: Diagnosis not present

## 2018-10-15 DIAGNOSIS — Z992 Dependence on renal dialysis: Secondary | ICD-10-CM | POA: Diagnosis not present

## 2018-10-15 DIAGNOSIS — N186 End stage renal disease: Secondary | ICD-10-CM | POA: Diagnosis not present

## 2018-10-15 DIAGNOSIS — N2581 Secondary hyperparathyroidism of renal origin: Secondary | ICD-10-CM | POA: Diagnosis not present

## 2018-10-17 DIAGNOSIS — N186 End stage renal disease: Secondary | ICD-10-CM | POA: Diagnosis not present

## 2018-10-17 DIAGNOSIS — Z992 Dependence on renal dialysis: Secondary | ICD-10-CM | POA: Diagnosis not present

## 2018-10-17 DIAGNOSIS — N2581 Secondary hyperparathyroidism of renal origin: Secondary | ICD-10-CM | POA: Diagnosis not present

## 2018-10-17 DIAGNOSIS — Z23 Encounter for immunization: Secondary | ICD-10-CM | POA: Diagnosis not present

## 2018-10-19 ENCOUNTER — Other Ambulatory Visit: Payer: Self-pay | Admitting: Nurse Practitioner

## 2018-10-20 DIAGNOSIS — N2581 Secondary hyperparathyroidism of renal origin: Secondary | ICD-10-CM | POA: Diagnosis not present

## 2018-10-20 DIAGNOSIS — Z992 Dependence on renal dialysis: Secondary | ICD-10-CM | POA: Diagnosis not present

## 2018-10-20 DIAGNOSIS — N186 End stage renal disease: Secondary | ICD-10-CM | POA: Diagnosis not present

## 2018-10-20 DIAGNOSIS — Z23 Encounter for immunization: Secondary | ICD-10-CM | POA: Diagnosis not present

## 2018-10-22 DIAGNOSIS — Z23 Encounter for immunization: Secondary | ICD-10-CM | POA: Diagnosis not present

## 2018-10-22 DIAGNOSIS — Z992 Dependence on renal dialysis: Secondary | ICD-10-CM | POA: Diagnosis not present

## 2018-10-22 DIAGNOSIS — N186 End stage renal disease: Secondary | ICD-10-CM | POA: Diagnosis not present

## 2018-10-22 DIAGNOSIS — N2581 Secondary hyperparathyroidism of renal origin: Secondary | ICD-10-CM | POA: Diagnosis not present

## 2018-10-23 ENCOUNTER — Ambulatory Visit: Payer: Self-pay | Admitting: Nurse Practitioner

## 2018-10-24 DIAGNOSIS — Z992 Dependence on renal dialysis: Secondary | ICD-10-CM | POA: Diagnosis not present

## 2018-10-24 DIAGNOSIS — N186 End stage renal disease: Secondary | ICD-10-CM | POA: Diagnosis not present

## 2018-10-24 DIAGNOSIS — N2581 Secondary hyperparathyroidism of renal origin: Secondary | ICD-10-CM | POA: Diagnosis not present

## 2018-10-24 DIAGNOSIS — Z23 Encounter for immunization: Secondary | ICD-10-CM | POA: Diagnosis not present

## 2018-10-27 DIAGNOSIS — Z23 Encounter for immunization: Secondary | ICD-10-CM | POA: Diagnosis not present

## 2018-10-27 DIAGNOSIS — N2581 Secondary hyperparathyroidism of renal origin: Secondary | ICD-10-CM | POA: Diagnosis not present

## 2018-10-27 DIAGNOSIS — Z992 Dependence on renal dialysis: Secondary | ICD-10-CM | POA: Diagnosis not present

## 2018-10-27 DIAGNOSIS — N186 End stage renal disease: Secondary | ICD-10-CM | POA: Diagnosis not present

## 2018-10-28 ENCOUNTER — Ambulatory Visit: Payer: Medicare Other | Admitting: Nurse Practitioner

## 2018-10-28 ENCOUNTER — Ambulatory Visit: Payer: Self-pay

## 2018-10-29 DIAGNOSIS — N2581 Secondary hyperparathyroidism of renal origin: Secondary | ICD-10-CM | POA: Diagnosis not present

## 2018-10-29 DIAGNOSIS — Z992 Dependence on renal dialysis: Secondary | ICD-10-CM | POA: Diagnosis not present

## 2018-10-29 DIAGNOSIS — I129 Hypertensive chronic kidney disease with stage 1 through stage 4 chronic kidney disease, or unspecified chronic kidney disease: Secondary | ICD-10-CM | POA: Diagnosis not present

## 2018-10-29 DIAGNOSIS — N186 End stage renal disease: Secondary | ICD-10-CM | POA: Diagnosis not present

## 2018-10-31 DIAGNOSIS — N186 End stage renal disease: Secondary | ICD-10-CM | POA: Diagnosis not present

## 2018-10-31 DIAGNOSIS — Z992 Dependence on renal dialysis: Secondary | ICD-10-CM | POA: Diagnosis not present

## 2018-10-31 DIAGNOSIS — N2581 Secondary hyperparathyroidism of renal origin: Secondary | ICD-10-CM | POA: Diagnosis not present

## 2018-11-03 DIAGNOSIS — Z992 Dependence on renal dialysis: Secondary | ICD-10-CM | POA: Diagnosis not present

## 2018-11-03 DIAGNOSIS — N186 End stage renal disease: Secondary | ICD-10-CM | POA: Diagnosis not present

## 2018-11-03 DIAGNOSIS — N2581 Secondary hyperparathyroidism of renal origin: Secondary | ICD-10-CM | POA: Diagnosis not present

## 2018-11-05 ENCOUNTER — Other Ambulatory Visit: Payer: Self-pay | Admitting: Nurse Practitioner

## 2018-11-05 DIAGNOSIS — Z992 Dependence on renal dialysis: Secondary | ICD-10-CM | POA: Diagnosis not present

## 2018-11-05 DIAGNOSIS — E119 Type 2 diabetes mellitus without complications: Secondary | ICD-10-CM | POA: Diagnosis not present

## 2018-11-05 DIAGNOSIS — N2581 Secondary hyperparathyroidism of renal origin: Secondary | ICD-10-CM | POA: Diagnosis not present

## 2018-11-05 DIAGNOSIS — N186 End stage renal disease: Secondary | ICD-10-CM | POA: Diagnosis not present

## 2018-11-06 ENCOUNTER — Other Ambulatory Visit: Payer: Self-pay | Admitting: Nurse Practitioner

## 2018-11-07 DIAGNOSIS — Z992 Dependence on renal dialysis: Secondary | ICD-10-CM | POA: Diagnosis not present

## 2018-11-07 DIAGNOSIS — N186 End stage renal disease: Secondary | ICD-10-CM | POA: Diagnosis not present

## 2018-11-07 DIAGNOSIS — N2581 Secondary hyperparathyroidism of renal origin: Secondary | ICD-10-CM | POA: Diagnosis not present

## 2018-11-09 DIAGNOSIS — E1351 Other specified diabetes mellitus with diabetic peripheral angiopathy without gangrene: Secondary | ICD-10-CM | POA: Diagnosis not present

## 2018-11-09 DIAGNOSIS — L602 Onychogryphosis: Secondary | ICD-10-CM | POA: Diagnosis not present

## 2018-11-09 DIAGNOSIS — L84 Corns and callosities: Secondary | ICD-10-CM | POA: Diagnosis not present

## 2018-11-10 DIAGNOSIS — N186 End stage renal disease: Secondary | ICD-10-CM | POA: Diagnosis not present

## 2018-11-10 DIAGNOSIS — Z992 Dependence on renal dialysis: Secondary | ICD-10-CM | POA: Diagnosis not present

## 2018-11-10 DIAGNOSIS — N2581 Secondary hyperparathyroidism of renal origin: Secondary | ICD-10-CM | POA: Diagnosis not present

## 2018-11-12 DIAGNOSIS — Z992 Dependence on renal dialysis: Secondary | ICD-10-CM | POA: Diagnosis not present

## 2018-11-12 DIAGNOSIS — N2581 Secondary hyperparathyroidism of renal origin: Secondary | ICD-10-CM | POA: Diagnosis not present

## 2018-11-12 DIAGNOSIS — N186 End stage renal disease: Secondary | ICD-10-CM | POA: Diagnosis not present

## 2018-11-14 DIAGNOSIS — N186 End stage renal disease: Secondary | ICD-10-CM | POA: Diagnosis not present

## 2018-11-14 DIAGNOSIS — N2581 Secondary hyperparathyroidism of renal origin: Secondary | ICD-10-CM | POA: Diagnosis not present

## 2018-11-14 DIAGNOSIS — Z992 Dependence on renal dialysis: Secondary | ICD-10-CM | POA: Diagnosis not present

## 2018-11-17 DIAGNOSIS — N186 End stage renal disease: Secondary | ICD-10-CM | POA: Diagnosis not present

## 2018-11-17 DIAGNOSIS — N2581 Secondary hyperparathyroidism of renal origin: Secondary | ICD-10-CM | POA: Diagnosis not present

## 2018-11-17 DIAGNOSIS — Z992 Dependence on renal dialysis: Secondary | ICD-10-CM | POA: Diagnosis not present

## 2018-11-19 DIAGNOSIS — N186 End stage renal disease: Secondary | ICD-10-CM | POA: Diagnosis not present

## 2018-11-19 DIAGNOSIS — Z992 Dependence on renal dialysis: Secondary | ICD-10-CM | POA: Diagnosis not present

## 2018-11-19 DIAGNOSIS — N2581 Secondary hyperparathyroidism of renal origin: Secondary | ICD-10-CM | POA: Diagnosis not present

## 2018-11-21 DIAGNOSIS — N2581 Secondary hyperparathyroidism of renal origin: Secondary | ICD-10-CM | POA: Diagnosis not present

## 2018-11-21 DIAGNOSIS — Z992 Dependence on renal dialysis: Secondary | ICD-10-CM | POA: Diagnosis not present

## 2018-11-21 DIAGNOSIS — N186 End stage renal disease: Secondary | ICD-10-CM | POA: Diagnosis not present

## 2018-11-24 ENCOUNTER — Other Ambulatory Visit: Payer: Self-pay

## 2018-11-24 ENCOUNTER — Ambulatory Visit (INDEPENDENT_AMBULATORY_CARE_PROVIDER_SITE_OTHER): Payer: Medicare Other

## 2018-11-24 VITALS — BP 117/83 | Temp 98.3°F | Ht 71.0 in | Wt 238.0 lb

## 2018-11-24 DIAGNOSIS — N2581 Secondary hyperparathyroidism of renal origin: Secondary | ICD-10-CM | POA: Diagnosis not present

## 2018-11-24 DIAGNOSIS — Z0000 Encounter for general adult medical examination without abnormal findings: Secondary | ICD-10-CM

## 2018-11-24 DIAGNOSIS — N186 End stage renal disease: Secondary | ICD-10-CM | POA: Diagnosis not present

## 2018-11-24 DIAGNOSIS — Z992 Dependence on renal dialysis: Secondary | ICD-10-CM | POA: Diagnosis not present

## 2018-11-24 NOTE — Progress Notes (Signed)
Subjective:   Javier Jenkins is a 68 y.o. male who presents for Medicare Annual/Subsequent preventive examination.  This visit type was conducted due to national recommendations for restrictions regarding the COVID-19 Pandemic (e.g. social distancing). This format is felt to be most appropriate for this patient at this time. All issues noted in this document were discussed and addressed. No physical exam was performed (except for noted visual exam findings with Video Visits). The patient, Javier Jenkins, has given consent to perform this visit via video. Vital signs may be absent or patient reported.   Patient location:  At home   Nurse location:  [place] office   Review of Systems:  n/a Cardiac Risk Factors include: advanced age (>67men, >52 women);hypertension;obesity (BMI >30kg/m2);male gender     Objective:    Vitals: BP 117/83 Comment: per patient   Temp 98.3 F (36.8 C) Comment: per patient   Ht 5\' 11"  (1.803 m) Comment: per patient   Wt 238 lb (108 kg) Comment: per patient   BMI 33.19 kg/m   Body mass index is 33.19 kg/m.  Advanced Directives 11/24/2018 08/28/2018 08/19/2018 07/11/2018 11/13/2017 05/12/2017 11/28/2015  Does Patient Have a Medical Advance Directive? No No No No No No No  Would patient like information on creating a medical advance directive? - No - Patient declined No - Patient declined No - Patient declined No - Patient declined - No - patient declined information    Tobacco Social History   Tobacco Use  Smoking Status Former Smoker   Types: Pipe   Quit date: 09/22/1984   Years since quitting: 34.1  Smokeless Tobacco Never Used     Counseling given: Not Answered   Clinical Intake:  Pre-visit preparation completed: Yes  Pain : No/denies pain     Nutritional Status: BMI > 30  Obese Nutritional Risks: None Diabetes: No  How often do you need to have someone help you when you read instructions, pamphlets, or other written materials  from your doctor or pharmacy?: 1 - Never What is the last grade level you completed in school?: Master's degree  Interpreter Needed?: No  Information entered by :: NAllen LPN  Past Medical History:  Diagnosis Date   Anemia    Chronic kidney disease    stage 5  Dialysis T/Th/Sa   Constipation    Diabetes mellitus without complication ([place])    not on medications   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   Hypertension    Hypothyroidism    Non-Hodgkin lymphoma of intra-abdominal lymph nodes ([place] [facility] [place]) 06/07/2015   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   Stroke ([place]) 1998   right side paralysis   Past Surgical History:  Procedure Laterality Date   AV FISTULA PLACEMENT Left 10/18/2014   Procedure: ARTERIOVENOUS (AV) FISTULA CREATION;  Surgeon: Angelia Mould, MD;  Location: [facility];  Service: Vascular;  Laterality: Left;   BURR HOLE Right 03/17/2015   Procedure: Trudee Kuster HOLES FOR RIGHT SUBDURAL HEMATOMA;  Surgeon: Leeroy Cha, MD;  Location: [facility];  Service: Neurosurgery;  Laterality: Right;   COLONOSCOPY     HEMORRHOID SURGERY     IR REMOVAL TUN ACCESS W/ PORT W/O FL MOD SED  05/12/2017   REVISON OF ARTERIOVENOUS FISTULA Left 09/04/2018   Procedure: EXCISION OF ULCER ON LEFT ARM ARTERIOVENOUS FISTULA;  Surgeon: Angelia Mould, MD;  Location: [facility] OR;  Service: Vascular;  Laterality: Left;   Family History  Problem Relation Age of Onset  Hypertension Mother    Cancer Paternal Uncle        prostate ca   Social History   Socioeconomic History   Marital status: Married    Spouse name: Not on file   Number of children: Not on file   Years of education: Not on file   Highest education level: Not on file  Occupational History   Occupation: retired  Scientist, product/process development strain: Not hard at all   Food insecurity    Worry: Never true    Inability: Never true   Transportation needs    Medical: No      Non-medical: No  Tobacco Use   Smoking status: Former Smoker    Types: Pipe    Quit date: 09/22/1984    Years since quitting: 34.1   Smokeless tobacco: Never Used  Substance and Sexual Activity   Alcohol use: No    Alcohol/week: 0.0 standard drinks   Drug use: No   Sexual activity: Yes    Comment: pastor, married, 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  Lifestyle   Physical activity    Days per week: 3 days    Minutes per session: 30 min   Stress: Not at all  Relationships   Social connections    Talks on phone: Not on file    Gets together: Not on file    Attends religious service: Not on file    Active member of club or organization: Not on file    Attends meetings of clubs or organizations: Not on file    Relationship status: Not on file  Other Topics Concern   Not on file  Social History Narrative   Not on file    Outpatient Encounter Medications as of 11/24/2018  Medication Sig   aspirin 325 MG tablet Take 1 tablet (325 mg total) by mouth daily.   atorvastatin (LIPITOR) 10 MG tablet TAKE 1 TABLET BY MOUTH EVERY DAY   cinacalcet (SENSIPAR) 30 MG tablet Take 30 mg by mouth daily.   ferric citrate (AURYXIA) 1 GM 210 MG(Fe) tablet Take 420 mg by mouth 3 (three) times daily with meals.   levETIRAcetam (KEPPRA) 500 MG tablet TAKE 1 TABLET BY MOUTH TWICE A DAY   levothyroxine (SYNTHROID) 25 MCG tablet TAKE 1 TABLET BY MOUTH EVERY DAY   metoprolol (LOPRESSOR) 100 MG tablet Take 100 mg by mouth 2 (two) times daily.    multivitamin (RENA-VIT) TABS tablet Take 1 tablet by mouth at bedtime.   cloNIDine (CATAPRES) 0.2 MG tablet Take 0.2 mg by mouth daily.    oxyCODONE (ROXICODONE) 5 MG immediate release tablet Take 1 tablet (5 mg total) by mouth every 4 (four) hours as needed. (Patient not taking: Reported on 11/24/2018)   No facility-administered encounter medications on file as of 11/24/2018.     Activities of Daily Living In your present state of health, do you have  any difficulty performing the following activities: 11/24/2018 08/28/2018  Hearing? N N  Vision? N N  Difficulty concentrating or making decisions? N N  Walking or climbing stairs? N Y  Comment - due to stroke on right side  Dressing or bathing? N N  Doing errands, shopping? N N  Preparing Food and eating ? N -  Using the Toilet? N -  In the past six months, have you accidently leaked urine? N -  Do you have problems with loss of bowel control? N -  Managing your Medications? N -  Managing your  Finances? N -  Housekeeping or managing your Housekeeping? N -  Some recent data might be hidden    Patient Care Team: [name], MD as PCP - General (Internal Medicine) [name], MD as Consulting Physician (Nephrology) Heath Lark, MD as Consulting Physician (Hematology and Oncology) Center, [facility]   Assessment:   This is a routine wellness examination for Javier Jenkins.  Exercise Activities and Dietary recommendations Current Exercise Habits: Home exercise routine, Type of exercise: walking, Time (Minutes): 30, Frequency (Times/Week): 3, Weekly Exercise (Minutes/Week): 90  Goals     Patient Stated     11/24/2018, no goals at this time       Fall Risk Fall Risk  11/24/2018 07/20/2018 02/20/2018 09/01/2017  Falls in the past year? 1 1 0 No  Comment tripped out the back door - - [place] Telephone Survey: data to providers prior to load  Number falls in past yr: 1 0 - -  Injury with Fall? 1 1 0 -  Comment fractured shoulder - - -  Risk for fall due to : History of fall(s);Impaired balance/gait;Medication side effect - - -  Follow up Falls evaluation completed;Falls prevention discussed - - -   Is the patient's home free of loose throw rugs in walkways, pet beds, electrical cords, etc?   yes      Grab bars in the bathroom? yes      Handrails on the stairs?   yes      Adequate lighting?   yes  Timed Get Up and Go Performed: n/a  Depression Screen PHQ 2/9 Scores  11/24/2018 07/20/2018 02/20/2018  PHQ - 2 Score 0 0 0  PHQ- 9 Score 0 - -    Cognitive Function     6CIT Screen 11/24/2018  What Year? 0 points  What month? 0 points  What time? 0 points  Count back from 20 0 points  Months in reverse 0 points  Repeat phrase 0 points  Total Score 0    Immunization History  Administered Date(s) Administered   Influenza, High Dose Seasonal PF 11/18/2016   Influenza,inj,Quad PF,6+ Mos 11/28/2015    Qualifies for Shingles Vaccine? yes  Screening Tests Health Maintenance  Topic Date Due   Hepatitis C Screening  07/29/1950   TETANUS/TDAP  05/19/1967   PNA vac Low Risk Adult (1 of 2 - PCV13) 05/18/2013   INFLUENZA VACCINE  08/29/2018   COLONOSCOPY  01/13/2028   Cancer Screenings: Lung: Low Dose CT Chest recommended if Age 68-80 years, 30 pack-year currently smoking OR have quit w/in 15years. Patient does not qualify. Colorectal: up to date  Additional Screenings:  Hepatitis C Screening: due      Plan:    Patient has no goals set at this time.  I have personally reviewed and noted the following in the patients chart:    Medical and social history  Use of alcohol, tobacco or illicit drugs   Current medications and supplements  Functional ability and status  Nutritional status  Physical activity  Advanced directives  List of other physicians  Hospitalizations, surgeries, and ER visits in previous 12 months  Vitals  Screenings to include cognitive, depression, and falls  Referrals and appointments  In addition, I have reviewed and discussed with patient certain preventive protocols, quality metrics, and best practice recommendations. A written personalized care plan for preventive services as well as general preventive health recommendations were provided to patient.     Kellie Simmering, LPN  [id number]

## 2018-11-24 NOTE — Patient Instructions (Signed)
Javier Jenkins , Thank you for taking time to come for your Medicare Wellness Visit. I appreciate your ongoing commitment to your health goals. Please review the following plan we discussed and let me know if I can assist you in the future.   Screening recommendations/referrals: Colonoscopy: 12/2017 Recommended yearly ophthalmology/optometry visit for glaucoma screening and checkup Recommended yearly dental visit for hygiene and checkup  Vaccinations: Influenza vaccine: 09/2018 Pneumococcal vaccine: declines Tdap vaccine: declines Shingles vaccine: discussed    Advanced directives: Advance directive discussed with you today.  Conditions/risks identified: obesity  Next appointment: 12/07/2018 at 9:30  Preventive Care 74 Years and Older, Male Preventive care refers to lifestyle choices and visits with your health care provider that can promote health and wellness. What does preventive care include?  A yearly physical exam. This is also called an annual well check.  Dental exams once or twice a year.  Routine eye exams. Ask your health care provider how often you should have your eyes checked.  Personal lifestyle choices, including:  Daily care of your teeth and gums.  Regular physical activity.  Eating a healthy diet.  Avoiding tobacco and drug use.  Limiting alcohol use.  Practicing safe sex.  Taking low doses of aspirin every day.  Taking vitamin and mineral supplements as recommended by your health care provider. What happens during an annual well check? The services and screenings done by your health care provider during your annual well check will depend on your age, overall health, lifestyle risk factors, and family history of disease. Counseling  Your health care provider may ask you questions about your:  Alcohol use.  Tobacco use.  Drug use.  Emotional well-being.  Home and relationship well-being.  Sexual activity.  Eating habits.  History of  falls.  Memory and ability to understand (cognition).  Work and work Statistician. Screening  You may have the following tests or measurements:  Height, weight, and BMI.  Blood pressure.  Lipid and cholesterol levels. These may be checked every 5 years, or more frequently if you are over 60 years old.  Skin check.  Lung cancer screening. You may have this screening every year starting at age 52 if you have a 30-pack-year history of smoking and currently smoke or have quit within the past 15 years.  Fecal occult blood test (FOBT) of the stool. You may have this test every year starting at age 81.  Flexible sigmoidoscopy or colonoscopy. You may have a sigmoidoscopy every 5 years or a colonoscopy every 10 years starting at age 81.  Prostate cancer screening. Recommendations will vary depending on your family history and other risks.  Hepatitis C blood test.  Hepatitis B blood test.  Sexually transmitted disease (STD) testing.  Diabetes screening. This is done by checking your blood sugar (glucose) after you have not eaten for a while (fasting). You may have this done every 1-3 years.  Abdominal aortic aneurysm (AAA) screening. You may need this if you are a current or former smoker.  Osteoporosis. You may be screened starting at age 15 if you are at high risk. Talk with your health care provider about your test results, treatment options, and if necessary, the need for more tests. Vaccines  Your health care provider may recommend certain vaccines, such as:  Influenza vaccine. This is recommended every year.  Tetanus, diphtheria, and acellular pertussis (Tdap, Td) vaccine. You may need a Td booster every 10 years.  Zoster vaccine. You may need this after age 54.  Pneumococcal 13-valent conjugate (PCV13) vaccine. One dose is recommended after age 30.  Pneumococcal polysaccharide (PPSV23) vaccine. One dose is recommended after age 42. Talk to your health care provider about  which screenings and vaccines you need and how often you need them. This information is not intended to replace advice given to you by your health care provider. Make sure you discuss any questions you have with your health care provider. Document Released: 02/10/2015 Document Revised: 10/04/2015 Document Reviewed: 11/15/2014 Elsevier Interactive Patient Education  2017 [place] Prevention in the Home Falls can cause injuries. They can happen to people of all ages. There are many things you can do to make your home safe and to help prevent falls. What can I do on the outside of my home?  Regularly fix the edges of walkways and driveways and fix any cracks.  Remove anything that might make you trip as you walk through a door, such as a raised step or threshold.  Trim any bushes or trees on the path to your home.  Use bright outdoor lighting.  Clear any walking paths of anything that might make someone trip, such as rocks or tools.  Regularly check to see if handrails are loose or broken. Make sure that both sides of any steps have handrails.  Any raised decks and porches should have guardrails on the edges.  Have any leaves, snow, or ice cleared regularly.  Use sand or salt on walking paths during winter.  Clean up any spills in your garage right away. This includes oil or grease spills. What can I do in the bathroom?  Use night lights.  Install grab bars by the toilet and in the tub and shower. Do not use towel bars as grab bars.  Use non-skid mats or decals in the tub or shower.  If you need to sit down in the shower, use a plastic, non-slip stool.  Keep the floor dry. Clean up any water that spills on the floor as soon as it happens.  Remove soap buildup in the tub or shower regularly.  Attach bath mats securely with double-sided non-slip rug tape.  Do not have throw rugs and other things on the floor that can make you trip. What can I do in the bedroom?   Use night lights.  Make sure that you have a light by your bed that is easy to reach.  Do not use any sheets or blankets that are too big for your bed. They should not hang down onto the floor.  Have a firm chair that has side arms. You can use this for support while you get dressed.  Do not have throw rugs and other things on the floor that can make you trip. What can I do in the kitchen?  Clean up any spills right away.  Avoid walking on wet floors.  Keep items that you use a lot in easy-to-reach places.  If you need to reach something above you, use a strong step stool that has a grab bar.  Keep electrical cords out of the way.  Do not use floor polish or wax that makes floors slippery. If you must use wax, use non-skid floor wax.  Do not have throw rugs and other things on the floor that can make you trip. What can I do with my stairs?  Do not leave any items on the stairs.  Make sure that there are handrails on both sides of the stairs and use them.  Fix handrails that are broken or loose. Make sure that handrails are as long as the stairways.  Check any carpeting to make sure that it is firmly attached to the stairs. Fix any carpet that is loose or worn.  Avoid having throw rugs at the top or bottom of the stairs. If you do have throw rugs, attach them to the floor with carpet tape.  Make sure that you have a light switch at the top of the stairs and the bottom of the stairs. If you do not have them, ask someone to add them for you. What else can I do to help prevent falls?  Wear shoes that:  Do not have high heels.  Have rubber bottoms.  Are comfortable and fit you well.  Are closed at the toe. Do not wear sandals.  If you use a stepladder:  Make sure that it is fully opened. Do not climb a closed stepladder.  Make sure that both sides of the stepladder are locked into place.  Ask someone to hold it for you, if possible.  Clearly mark and make sure that  you can see:  Any grab bars or handrails.  First and last steps.  Where the edge of each step is.  Use tools that help you move around (mobility aids) if they are needed. These include:  Canes.  Walkers.  Scooters.  Crutches.  Turn on the lights when you go into a dark area. Replace any light bulbs as soon as they burn out.  Set up your furniture so you have a clear path. Avoid moving your furniture around.  If any of your floors are uneven, fix them.  If there are any pets around you, be aware of where they are.  Review your medicines with your doctor. Some medicines can make you feel dizzy. This can increase your chance of falling. Ask your doctor what other things that you can do to help prevent falls. This information is not intended to replace advice given to you by your health care provider. Make sure you discuss any questions you have with your health care provider. Document Released: 11/10/2008 Document Revised: 06/22/2015 Document Reviewed: 02/18/2014 Elsevier Interactive Patient Education  2017 [employer].

## 2018-11-26 DIAGNOSIS — N2581 Secondary hyperparathyroidism of renal origin: Secondary | ICD-10-CM | POA: Diagnosis not present

## 2018-11-26 DIAGNOSIS — Z992 Dependence on renal dialysis: Secondary | ICD-10-CM | POA: Diagnosis not present

## 2018-11-26 DIAGNOSIS — N186 End stage renal disease: Secondary | ICD-10-CM | POA: Diagnosis not present

## 2018-11-28 DIAGNOSIS — N186 End stage renal disease: Secondary | ICD-10-CM | POA: Diagnosis not present

## 2018-11-28 DIAGNOSIS — N2581 Secondary hyperparathyroidism of renal origin: Secondary | ICD-10-CM | POA: Diagnosis not present

## 2018-11-28 DIAGNOSIS — Z992 Dependence on renal dialysis: Secondary | ICD-10-CM | POA: Diagnosis not present

## 2018-11-29 DIAGNOSIS — Z992 Dependence on renal dialysis: Secondary | ICD-10-CM | POA: Diagnosis not present

## 2018-11-29 DIAGNOSIS — I129 Hypertensive chronic kidney disease with stage 1 through stage 4 chronic kidney disease, or unspecified chronic kidney disease: Secondary | ICD-10-CM | POA: Diagnosis not present

## 2018-11-29 DIAGNOSIS — N186 End stage renal disease: Secondary | ICD-10-CM | POA: Diagnosis not present

## 2018-12-01 DIAGNOSIS — Z992 Dependence on renal dialysis: Secondary | ICD-10-CM | POA: Diagnosis not present

## 2018-12-01 DIAGNOSIS — N186 End stage renal disease: Secondary | ICD-10-CM | POA: Diagnosis not present

## 2018-12-01 DIAGNOSIS — N2581 Secondary hyperparathyroidism of renal origin: Secondary | ICD-10-CM | POA: Diagnosis not present

## 2018-12-03 DIAGNOSIS — N2581 Secondary hyperparathyroidism of renal origin: Secondary | ICD-10-CM | POA: Diagnosis not present

## 2018-12-03 DIAGNOSIS — N186 End stage renal disease: Secondary | ICD-10-CM | POA: Diagnosis not present

## 2018-12-03 DIAGNOSIS — Z992 Dependence on renal dialysis: Secondary | ICD-10-CM | POA: Diagnosis not present

## 2018-12-05 DIAGNOSIS — Z992 Dependence on renal dialysis: Secondary | ICD-10-CM | POA: Diagnosis not present

## 2018-12-05 DIAGNOSIS — N2581 Secondary hyperparathyroidism of renal origin: Secondary | ICD-10-CM | POA: Diagnosis not present

## 2018-12-05 DIAGNOSIS — N186 End stage renal disease: Secondary | ICD-10-CM | POA: Diagnosis not present

## 2018-12-07 ENCOUNTER — Ambulatory Visit (INDEPENDENT_AMBULATORY_CARE_PROVIDER_SITE_OTHER): Payer: Medicare Other | Admitting: Nurse Practitioner

## 2018-12-07 ENCOUNTER — Other Ambulatory Visit: Payer: Self-pay

## 2018-12-07 ENCOUNTER — Encounter: Payer: Self-pay | Admitting: Nurse Practitioner

## 2018-12-07 VITALS — BP 110/80 | HR 67 | Temp 98.4°F | Ht 69.6 in | Wt 205.2 lb

## 2018-12-07 DIAGNOSIS — R7303 Prediabetes: Secondary | ICD-10-CM

## 2018-12-07 DIAGNOSIS — Z992 Dependence on renal dialysis: Secondary | ICD-10-CM | POA: Diagnosis not present

## 2018-12-07 DIAGNOSIS — E039 Hypothyroidism, unspecified: Secondary | ICD-10-CM

## 2018-12-07 DIAGNOSIS — I120 Hypertensive chronic kidney disease with stage 5 chronic kidney disease or end stage renal disease: Secondary | ICD-10-CM | POA: Diagnosis not present

## 2018-12-07 DIAGNOSIS — I10 Essential (primary) hypertension: Secondary | ICD-10-CM

## 2018-12-07 DIAGNOSIS — N186 End stage renal disease: Secondary | ICD-10-CM | POA: Diagnosis not present

## 2018-12-07 DIAGNOSIS — I69351 Hemiplegia and hemiparesis following cerebral infarction affecting right dominant side: Secondary | ICD-10-CM

## 2018-12-07 MED ORDER — METOPROLOL TARTRATE 100 MG PO TABS: 100.0000 mg | ORAL_TABLET | Freq: Two times a day (BID) | ORAL | 0 refills | Status: DC

## 2018-12-07 NOTE — Progress Notes (Signed)
Subjective:     Patient ID: Javier Jenkins , male    DOB: 11/23/1950 , 68 y.o.   MRN: [id number]   Chief Complaint  Patient presents with  . Hypothyroidism    HPI  Continues with hemodialysis - TThSat - doing well after fistula repair  Thyroid Problem Presents for follow-up visit. Patient reports no anxiety, fatigue or palpitations. The symptoms have been stable.     Past Medical History:  Diagnosis Date  . Anemia   . Chronic kidney disease    stage 5  Dialysis T/Th/Sa  . Constipation   . Diabetes mellitus without complication ([place])    not on medications  . 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 Hypertension   . Hypothyroidism   . Non-Hodgkin lymphoma of intra-abdominal lymph nodes ([place]) 06/07/2015  . 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 Stroke [facility]) 1998   right side paralysis     Family History  Problem Relation Age of Onset  . Hypertension Mother   . Cancer Paternal Uncle        prostate ca     Current Outpatient Medications:  .  aspirin 325 MG tablet, Take 1 tablet (325 mg total) by mouth daily., Disp: 30 tablet, Rfl: 0 .  atorvastatin (LIPITOR) 10 MG tablet, TAKE 1 TABLET BY MOUTH EVERY DAY, Disp: 90 tablet, Rfl: 0 .  cinacalcet (SENSIPAR) 30 MG tablet, Take 30 mg by mouth daily., Disp: , Rfl:  .  ferric citrate (AURYXIA) 1 GM 210 MG(Fe) tablet, Take 420 mg by mouth 3 (three) times daily with meals., Disp: , Rfl:  .  levETIRAcetam (KEPPRA) 500 MG tablet, TAKE 1 TABLET BY MOUTH TWICE A DAY, Disp: 60 tablet, Rfl: 5 .  levothyroxine (SYNTHROID) 25 MCG tablet, TAKE 1 TABLET BY MOUTH EVERY DAY, Disp: 90 tablet, Rfl: 1 .  metoprolol (LOPRESSOR) 100 MG tablet, Take 100 mg by mouth 2 (two) times daily. , Disp: , Rfl:  .  multivitamin (RENA-VIT) TABS tablet, Take 1 tablet by mouth at bedtime., Disp: 90 tablet, Rfl: 0 .  cloNIDine (CATAPRES) 0.2 MG tablet, Take 0.2 mg by mouth daily. , Disp: , Rfl:  .  oxyCODONE (ROXICODONE) 5  MG immediate release tablet, Take 1 tablet (5 mg total) by mouth every 4 (four) hours as needed. (Patient not taking: Reported on 11/24/2018), Disp: 15 tablet, Rfl: 0   Allergies  Allergen Reactions  . Tape Dermatitis    Paper tape     Review of Systems  Constitutional: Negative for fatigue.  Respiratory: Negative.   Cardiovascular: Negative for chest pain, palpitations and leg swelling.  Endocrine: Negative for polydipsia, polyphagia and polyuria.  Neurological: Negative for dizziness and headaches.  Psychiatric/Behavioral: Negative.  The patient is not nervous/anxious.      Today's Vitals   12/07/18 0940  BP: 110/80  Pulse: 67  Temp: 98.4 F (36.9 C)  TempSrc: Oral  Weight: 205 lb 3.2 oz (93.1 kg)  Height: 5' 9.6" (1.768 m)  PainSc: 0-No pain   Body mass index is 29.78 kg/m.   Objective:  Physical Exam Constitutional:      General: He is not in acute distress.    Appearance: Normal appearance.  Cardiovascular:     Rate and Rhythm: Normal rate and regular rhythm.     Pulses: Normal pulses.     Heart sounds: Normal heart sounds. No murmur.  Pulmonary:     Effort: Pulmonary effort is normal.  Breath sounds: Normal breath sounds.  Musculoskeletal:     Comments: Right hand contracted and right lower extremity has a brace  Neurological:     General: No focal deficit present.     Mental Status: He is alert.  Psychiatric:        Mood and Affect: Mood normal.         Assessment And Plan:     1. Essential hypertension . B/P is controlled.  Marland Kitchen BMP ordered to check renal function.  - metoprolol tartrate (LOPRESSOR) 100 MG tablet; Take 1 tablet (100 mg total) by mouth 2 (two) times daily.  Dispense: 180 tablet; Refill: 0 - BMP8+eGFR  2. Prediabetes  Chronic, controlled with diet   No current medications  Encouraged to limit intake of sugary foods and drinks - Hemoglobin A1c  3. Acquired hypothyroidism  Chronic, controlled  Continue with current  medications - T4 - T3, free - TSH  4. End stage renal failure on dialysis [facility])  Continue with hemodialysis Tuesday, Thursday and Saturday   [name], FNP    THE PATIENT IS ENCOURAGED TO PRACTICE SOCIAL DISTANCING DUE TO THE COVID-19 PANDEMIC.

## 2018-12-08 DIAGNOSIS — Z992 Dependence on renal dialysis: Secondary | ICD-10-CM | POA: Diagnosis not present

## 2018-12-08 DIAGNOSIS — N186 End stage renal disease: Secondary | ICD-10-CM | POA: Diagnosis not present

## 2018-12-08 DIAGNOSIS — N2581 Secondary hyperparathyroidism of renal origin: Secondary | ICD-10-CM | POA: Diagnosis not present

## 2018-12-08 LAB — BMP8+EGFR
BUN/Creatinine Ratio: 5 — ABNORMAL LOW (ref 10–24)
BUN: 48 mg/dL — ABNORMAL HIGH (ref 8–27)
CO2: 24 mmol/L (ref 20–29)
Calcium: 8.7 mg/dL (ref 8.6–10.2)
Chloride: 89 mmol/L — ABNORMAL LOW (ref 96–106)
Creatinine, Ser: 10.1 mg/dL — ABNORMAL HIGH (ref 0.76–1.27)
GFR calc Af Amer: 5 mL/min/{1.73_m2} — ABNORMAL LOW (ref >59)
GFR calc non Af Amer: 5 mL/min/{1.73_m2} — ABNORMAL LOW (ref >59)
Glucose: 84 mg/dL (ref 65–99)
Potassium: 5.1 mmol/L (ref 3.5–5.2)
Sodium: 136 mmol/L (ref 134–144)

## 2018-12-08 LAB — HEMOGLOBIN A1C
Est. average glucose Bld gHb Est-mCnc: 123 mg/dL
Hgb A1c MFr Bld: 5.9 % — ABNORMAL HIGH (ref 4.8–5.6)

## 2018-12-08 LAB — T3, FREE: T3, Free: 2.6 pg/mL (ref 2.0–4.4)

## 2018-12-08 LAB — TSH: TSH: 2.16 u[IU]/mL (ref 0.450–4.500)

## 2018-12-08 LAB — T4: T4, Total: 5.7 ug/dL (ref 4.5–12.0)

## 2018-12-10 DIAGNOSIS — N2581 Secondary hyperparathyroidism of renal origin: Secondary | ICD-10-CM | POA: Diagnosis not present

## 2018-12-10 DIAGNOSIS — N186 End stage renal disease: Secondary | ICD-10-CM | POA: Diagnosis not present

## 2018-12-10 DIAGNOSIS — Z992 Dependence on renal dialysis: Secondary | ICD-10-CM | POA: Diagnosis not present

## 2018-12-12 DIAGNOSIS — N186 End stage renal disease: Secondary | ICD-10-CM | POA: Diagnosis not present

## 2018-12-12 DIAGNOSIS — N2581 Secondary hyperparathyroidism of renal origin: Secondary | ICD-10-CM | POA: Diagnosis not present

## 2018-12-12 DIAGNOSIS — Z992 Dependence on renal dialysis: Secondary | ICD-10-CM | POA: Diagnosis not present

## 2018-12-15 ENCOUNTER — Other Ambulatory Visit: Payer: Self-pay

## 2018-12-15 DIAGNOSIS — N2581 Secondary hyperparathyroidism of renal origin: Secondary | ICD-10-CM | POA: Diagnosis not present

## 2018-12-15 DIAGNOSIS — N186 End stage renal disease: Secondary | ICD-10-CM | POA: Diagnosis not present

## 2018-12-15 DIAGNOSIS — Z992 Dependence on renal dialysis: Secondary | ICD-10-CM | POA: Diagnosis not present

## 2018-12-17 DIAGNOSIS — N186 End stage renal disease: Secondary | ICD-10-CM | POA: Diagnosis not present

## 2018-12-17 DIAGNOSIS — Z992 Dependence on renal dialysis: Secondary | ICD-10-CM | POA: Diagnosis not present

## 2018-12-17 DIAGNOSIS — N2581 Secondary hyperparathyroidism of renal origin: Secondary | ICD-10-CM | POA: Diagnosis not present

## 2018-12-19 DIAGNOSIS — N2581 Secondary hyperparathyroidism of renal origin: Secondary | ICD-10-CM | POA: Diagnosis not present

## 2018-12-19 DIAGNOSIS — N186 End stage renal disease: Secondary | ICD-10-CM | POA: Diagnosis not present

## 2018-12-19 DIAGNOSIS — Z992 Dependence on renal dialysis: Secondary | ICD-10-CM | POA: Diagnosis not present

## 2018-12-21 DIAGNOSIS — N186 End stage renal disease: Secondary | ICD-10-CM | POA: Diagnosis not present

## 2018-12-21 DIAGNOSIS — N2581 Secondary hyperparathyroidism of renal origin: Secondary | ICD-10-CM | POA: Diagnosis not present

## 2018-12-21 DIAGNOSIS — Z992 Dependence on renal dialysis: Secondary | ICD-10-CM | POA: Diagnosis not present

## 2018-12-23 DIAGNOSIS — N2581 Secondary hyperparathyroidism of renal origin: Secondary | ICD-10-CM | POA: Diagnosis not present

## 2018-12-23 DIAGNOSIS — N186 End stage renal disease: Secondary | ICD-10-CM | POA: Diagnosis not present

## 2018-12-23 DIAGNOSIS — Z992 Dependence on renal dialysis: Secondary | ICD-10-CM | POA: Diagnosis not present

## 2018-12-26 DIAGNOSIS — Z992 Dependence on renal dialysis: Secondary | ICD-10-CM | POA: Diagnosis not present

## 2018-12-26 DIAGNOSIS — N2581 Secondary hyperparathyroidism of renal origin: Secondary | ICD-10-CM | POA: Diagnosis not present

## 2018-12-26 DIAGNOSIS — N186 End stage renal disease: Secondary | ICD-10-CM | POA: Diagnosis not present

## 2019-01-15 ENCOUNTER — Encounter: Payer: Self-pay | Admitting: Family

## 2019-01-15 ENCOUNTER — Other Ambulatory Visit: Payer: Self-pay

## 2019-01-15 ENCOUNTER — Ambulatory Visit (INDEPENDENT_AMBULATORY_CARE_PROVIDER_SITE_OTHER): Payer: Medicare Other | Admitting: Family

## 2019-01-15 VITALS — BP 116/84 | HR 71 | Temp 97.1°F | Resp 16 | Ht 69.6 in | Wt 205.0 lb

## 2019-01-15 DIAGNOSIS — N186 End stage renal disease: Secondary | ICD-10-CM | POA: Diagnosis not present

## 2019-01-15 DIAGNOSIS — I770 Arteriovenous fistula, acquired: Secondary | ICD-10-CM | POA: Diagnosis not present

## 2019-01-15 DIAGNOSIS — Z992 Dependence on renal dialysis: Secondary | ICD-10-CM

## 2019-01-15 NOTE — Progress Notes (Signed)
CC: report received in referral from pt [place] that there is pus at the AVF  History of Present Illness  Javier Jenkins is a 68 y.o. ([birth year]) male who is s/p excision of ulcer overlying left upper arm fistula on 09-04-18 by Dr. Scot Dock for ulcer overlying left upper arm fistula.   He returns today at th request of Dr. Marval Regal for evaluation of pus filled lesions at the left upper arm AVF. However, pt states there has never been any pus at his AVF, he denies fever or chills, denies any bleeding issues from his AVF, denies any steal symptoms in his left hand.   He dialyzes via left upper arm AVF on TTS at [facility].  He is was right hand dominant before his stroke in 1998 that left his right upper extremity flaccid, now contracted.     Past Medical History:  Diagnosis Date  . Anemia   . Chronic kidney disease    stage 5  Dialysis T/Th/Sa  . Constipation   . Diabetes mellitus without complication ([place])    not on medications  . 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 Hypertension   . Hypothyroidism   . Non-Hodgkin lymphoma of intra-abdominal lymph nodes ([place]) 06/07/2015  . 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 Stroke [facility]) 1998   right side paralysis    Social History Social History   Tobacco Use  . Smoking status: Former Smoker    Types: Pipe    Quit date: 09/22/1984    Years since quitting: 34.3  . Smokeless tobacco: Never Used  Substance Use Topics  . Alcohol use: No    Alcohol/week: 0.0 standard drinks  . Drug use: No    Family History Family History  Problem Relation Age of Onset  . Hypertension Mother   . Cancer Paternal Uncle        prostate ca    Surgical History Past Surgical History:  Procedure Laterality Date  . AV FISTULA PLACEMENT Left 10/18/2014   Procedure: ARTERIOVENOUS (AV) FISTULA CREATION;  Surgeon: Angelia Mould, MD;  Location: [place];  Service: Vascular;  Laterality: Left;  . BURR  HOLE Right 03/17/2015   Procedure: BURR HOLES FOR RIGHT SUBDURAL HEMATOMA;  Surgeon: Leeroy Cha, MD;  Location: [place] NEURO ORS;  Service: Neurosurgery;  Laterality: Right;  . COLONOSCOPY    . HEMORRHOID SURGERY    . IR REMOVAL TUN ACCESS W/ PORT W/O FL MOD SED  05/12/2017  . REVISON OF ARTERIOVENOUS FISTULA Left 09/04/2018   Procedure: EXCISION OF ULCER ON LEFT ARM ARTERIOVENOUS FISTULA;  Surgeon: Angelia Mould, MD;  Location: [place];  Service: Vascular;  Laterality: Left;    Allergies  Allergen Reactions  . Tape Dermatitis    Paper tape    Current Outpatient Medications  Medication Sig Dispense Refill  . aspirin 325 MG tablet Take 1 tablet (325 mg total) by mouth daily. 30 tablet 0  . atorvastatin (LIPITOR) 10 MG tablet TAKE 1 TABLET BY MOUTH EVERY DAY 90 tablet 0  . cinacalcet (SENSIPAR) 30 MG tablet Take 30 mg by mouth daily.    . cloNIDine (CATAPRES) 0.2 MG tablet Take 0.2 mg by mouth daily.     . ferric citrate (AURYXIA) 1 GM 210 MG(Fe) tablet Take 420 mg by mouth 3 (three) times daily with meals.    . levETIRAcetam (KEPPRA) 500 MG tablet TAKE 1 TABLET BY MOUTH TWICE A DAY 60 tablet 5  .  levothyroxine (SYNTHROID) 25 MCG tablet TAKE 1 TABLET BY MOUTH EVERY DAY 90 tablet 1  . metoprolol tartrate (LOPRESSOR) 100 MG tablet Take 1 tablet (100 mg total) by mouth 2 (two) times daily. 180 tablet 0  . multivitamin (RENA-VIT) TABS tablet Take 1 tablet by mouth at bedtime. 90 tablet 0  . oxyCODONE (ROXICODONE) 5 MG immediate release tablet Take 1 tablet (5 mg total) by mouth every 4 (four) hours as needed. 15 tablet 0   No current facility-administered medications for this visit.     REVIEW OF SYSTEMS: see HPI for pertinent positives and negatives    PHYSICAL EXAMINATION:  Vitals:   01/15/19 1044  BP: 116/84  Pulse: 71  Resp: 16  Temp: (!) 97.1 F (36.2 C)  TempSrc: Oral  SpO2: 97%  Weight: 205 lb (93 kg)  Height: 5' 9.6" (1.768 m)   Body mass index is 29.75  kg/m.  General: The patient appears his stated age.   HEENT:  No gross abnormalities Pulmonary: Respirations are non-labored, no rales, rhonchi, or wheezes Abdomen: Soft and non-tender with normal bowel sounds. Musculoskeletal: Right upper extremity is contracted (s/p stroke in 1998) Neurologic: No focal weakness or paresthesias are detected, Skin: There are no ulcer or rashes noted. Psychiatric: The patient has normal affect. Cardiovascular: There is a regular rate and rhythm without significant murmur appreciated.  Left upper arm AVF with palpable thrill and audible bruit. There is no evidence of infection at the AVF. There is no thinning of skin at the AVF.  Left radial pulse is 2+ palpable.    Left upper arm AVF   Medical Decision Making  Javier Jenkins is a 68 y.o. male who presents with no evidence of infection at his left upper arm AVF, no thinning of skin at the AVF, no bleeding issues at the AVF per pt.  No steal symptoms per pt.   I discussed the above with Dr. Donzetta Matters, no intervention is needed as there is no issue with his AVF.     Clemon Chambers, RN, MSN, FNP-C [facility] Office: [phone]  01/15/2019, 11:10 AM  Clinic MD: Donzetta Matters

## 2019-01-20 ENCOUNTER — Other Ambulatory Visit: Payer: Self-pay | Admitting: Nurse Practitioner

## 2019-02-05 LAB — HM HEPATITIS C SCREENING LAB: HM Hepatitis Screen: NEGATIVE

## 2019-02-05 LAB — HEMOGLOBIN A1C: Hemoglobin A1C: 5.4

## 2019-03-02 ENCOUNTER — Other Ambulatory Visit: Payer: Self-pay | Admitting: Nurse Practitioner

## 2019-03-02 DIAGNOSIS — I10 Essential (primary) hypertension: Secondary | ICD-10-CM

## 2019-04-05 ENCOUNTER — Encounter: Payer: Self-pay | Admitting: Nurse Practitioner

## 2019-04-05 ENCOUNTER — Ambulatory Visit (INDEPENDENT_AMBULATORY_CARE_PROVIDER_SITE_OTHER): Payer: Medicare PPO | Admitting: Nurse Practitioner

## 2019-04-05 ENCOUNTER — Other Ambulatory Visit: Payer: Self-pay

## 2019-04-05 VITALS — BP 120/82 | HR 64 | Temp 97.8°F | Ht 69.6 in | Wt 206.8 lb

## 2019-04-05 DIAGNOSIS — I120 Hypertensive chronic kidney disease with stage 5 chronic kidney disease or end stage renal disease: Secondary | ICD-10-CM | POA: Diagnosis not present

## 2019-04-05 DIAGNOSIS — E039 Hypothyroidism, unspecified: Secondary | ICD-10-CM

## 2019-04-05 DIAGNOSIS — N186 End stage renal disease: Secondary | ICD-10-CM

## 2019-04-05 DIAGNOSIS — Z992 Dependence on renal dialysis: Secondary | ICD-10-CM | POA: Diagnosis not present

## 2019-04-05 DIAGNOSIS — I770 Arteriovenous fistula, acquired: Secondary | ICD-10-CM | POA: Insufficient documentation

## 2019-04-05 DIAGNOSIS — R7303 Prediabetes: Secondary | ICD-10-CM | POA: Insufficient documentation

## 2019-04-05 DIAGNOSIS — I10 Essential (primary) hypertension: Secondary | ICD-10-CM

## 2019-04-05 DIAGNOSIS — I69351 Hemiplegia and hemiparesis following cerebral infarction affecting right dominant side: Secondary | ICD-10-CM

## 2019-04-05 MED ORDER — ATORVASTATIN CALCIUM 10 MG PO TABS: 10.0000 mg | ORAL_TABLET | Freq: Every day | ORAL | 1 refills | Status: DC

## 2019-04-05 NOTE — Progress Notes (Signed)
This visit occurred during the SARS-CoV-2 public health emergency.  Safety protocols were in place, including screening questions prior to the visit, additional usage of staff PPE, and extensive cleaning of exam room while observing appropriate contact time as indicated for disinfecting solutions.  Subjective:     Patient ID: Javier Jenkins , male    DOB: [birth year] , 69 y.o.   MRN: [id number]   Chief Complaint  Patient presents with  . Hypertension    HPI  Continues with hemodialysis - TThSat - doing well after fistula repair  He will see the Oncologist once a year in July, currently in remission.   Hypertension Pertinent negatives include no palpitations. The current treatment provides significant improvement. There are no compliance problems.  Identifiable causes of hypertension include a thyroid problem.  Thyroid Problem Presents for follow-up visit. Patient reports no anxiety, fatigue or palpitations. The symptoms have been stable.     Past Medical History:  Diagnosis Date  . Anemia   . Chronic kidney disease    stage 5  Dialysis T/Th/Sa  . Constipation   . Diabetes mellitus without complication ([place])    not on medications  . 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 Hypertension   . Hypothyroidism   . Non-Hodgkin lymphoma of intra-abdominal lymph nodes ([place]) 06/07/2015  . 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 Stroke [facility]) 1998   right side paralysis     Family History  Problem Relation Age of Onset  . Hypertension Mother   . Cancer Paternal Uncle        prostate ca     Current Outpatient Medications:  .  aspirin 325 MG tablet, Take 1 tablet (325 mg total) by mouth daily., Disp: 30 tablet, Rfl: 0 .  atorvastatin (LIPITOR) 10 MG tablet, TAKE 1 TABLET BY MOUTH EVERY DAY, Disp: 90 tablet, Rfl: 0 .  cinacalcet (SENSIPAR) 30 MG tablet, Take 30 mg by mouth daily., Disp: , Rfl:  .  ferric citrate (AURYXIA) 1 GM 210 MG(Fe)  tablet, Take 420 mg by mouth 3 (three) times daily with meals., Disp: , Rfl:  .  levETIRAcetam (KEPPRA) 500 MG tablet, TAKE 1 TABLET BY MOUTH TWICE A DAY, Disp: 60 tablet, Rfl: 5 .  levothyroxine (SYNTHROID) 25 MCG tablet, TAKE 1 TABLET BY MOUTH EVERY DAY, Disp: 90 tablet, Rfl: 1 .  metoprolol tartrate (LOPRESSOR) 100 MG tablet, TAKE 1 TABLET BY MOUTH TWICE A DAY, Disp: 180 tablet, Rfl: 0 .  multivitamin (RENA-VIT) TABS tablet, Take 1 tablet by mouth at bedtime., Disp: 90 tablet, Rfl: 0 .  cloNIDine (CATAPRES) 0.2 MG tablet, Take 0.2 mg by mouth daily. , Disp: , Rfl:  .  oxyCODONE (ROXICODONE) 5 MG immediate release tablet, Take 1 tablet (5 mg total) by mouth every 4 (four) hours as needed. (Patient not taking: Reported on 04/05/2019), Disp: 15 tablet, Rfl: 0   Allergies  Allergen Reactions  . Tape Dermatitis    Paper tape     Review of Systems  Constitutional: Negative for fatigue.  Cardiovascular: Negative for palpitations.  Psychiatric/Behavioral: The patient is not nervous/anxious.      Today's Vitals   04/05/19 0954  BP: 120/82  Pulse: 64  Temp: 97.8 F (36.6 C)  Weight: 206 lb 12.8 oz (93.8 kg)  Height: 5' 9.6" (1.768 m)  PainSc: 0-No pain   Body mass index is 30.01 kg/m.   Objective:  Physical Exam Constitutional:      General: He  is not in acute distress.    Appearance: Normal appearance.  Cardiovascular:     Rate and Rhythm: Normal rate and regular rhythm.     Pulses: Normal pulses.     Heart sounds: Normal heart sounds. No murmur.     Arteriovenous access: left arteriovenous access is present.    Comments: Positive thrill and bruit to left AV fistula Pulmonary:     Effort: Pulmonary effort is normal. No respiratory distress.     Breath sounds: Normal breath sounds.  Musculoskeletal:     Comments: Right hand contracted and right lower extremity has a brace  Skin:    Capillary Refill: Capillary refill takes less than 2 seconds.  Neurological:     General: No  focal deficit present.     Mental Status: He is alert and oriented to person, place, and time.     Cranial Nerves: No cranial nerve deficit.  Psychiatric:        Mood and Affect: Mood normal.         Assessment And Plan:     1. Essential hypertension  Chronic, good control  Continue with current medications - CMP14+EGFR  2. Prediabetes  Chronic, slight elevation of HgbA1c is 5.9  Continue with diet controlled. - atorvastatin (LIPITOR) 10 MG tablet; Take 1 tablet (10 mg total) by mouth daily.  Dispense: 90 tablet; Refill: 1 - CMP14+EGFR - Hemoglobin A1c  3. Acquired hypothyroidism  Chronic, stable.  - CMP14+EGFR - TSH - T4 - T3, free  4. End stage renal failure on dialysis ([place])  Stable, continues with Nephrologist and dialysis on TThSat  5. AVF (arteriovenous fistula) (HCC)  Continues to have a thrill and bruit  6. Hemiparesis affecting right side as late effect of stroke (HCC)  Chronic, doing well.   Wears a brace to right lower extremity.  Right hand/wrist with contrature   [name], FNP    THE PATIENT IS ENCOURAGED TO PRACTICE SOCIAL DISTANCING DUE TO THE COVID-19 PANDEMIC.

## 2019-04-06 LAB — HEMOGLOBIN A1C
Est. average glucose Bld gHb Est-mCnc: 111 mg/dL
Hgb A1c MFr Bld: 5.5 % (ref 4.8–5.6)

## 2019-04-06 LAB — T3, FREE: T3, Free: 2.3 pg/mL (ref 2.0–4.4)

## 2019-04-06 LAB — CMP14+EGFR
ALT: 14 IU/L (ref 0–44)
AST: 20 IU/L (ref 0–40)
Albumin/Globulin Ratio: 1.3 (ref 1.2–2.2)
Albumin: 4.3 g/dL (ref 3.8–4.8)
Alkaline Phosphatase: 79 IU/L (ref 39–117)
BUN/Creatinine Ratio: 5 — ABNORMAL LOW (ref 10–24)
BUN: 49 mg/dL — ABNORMAL HIGH (ref 8–27)
Bilirubin Total: <0.2 mg/dL (ref 0.0–1.2)
CO2: 25 mmol/L (ref 20–29)
Calcium: 9.1 mg/dL (ref 8.6–10.2)
Chloride: 91 mmol/L — ABNORMAL LOW (ref 96–106)
Creatinine, Ser: 9.61 mg/dL — ABNORMAL HIGH (ref 0.76–1.27)
GFR calc Af Amer: 6 mL/min/{1.73_m2} — ABNORMAL LOW (ref >59)
GFR calc non Af Amer: 5 mL/min/{1.73_m2} — ABNORMAL LOW (ref >59)
Globulin, Total: 3.2 g/dL (ref 1.5–4.5)
Glucose: 79 mg/dL (ref 65–99)
Potassium: 5.9 mmol/L — ABNORMAL HIGH (ref 3.5–5.2)
Sodium: 135 mmol/L (ref 134–144)
Total Protein: 7.5 g/dL (ref 6.0–8.5)

## 2019-04-06 LAB — TSH: TSH: 2.81 u[IU]/mL (ref 0.450–4.500)

## 2019-04-06 LAB — T4: T4, Total: 5.9 ug/dL (ref 4.5–12.0)

## 2019-04-19 ENCOUNTER — Other Ambulatory Visit: Payer: Self-pay | Admitting: Nurse Practitioner

## 2019-05-27 ENCOUNTER — Other Ambulatory Visit: Payer: Self-pay | Admitting: Nurse Practitioner

## 2019-05-27 DIAGNOSIS — I10 Essential (primary) hypertension: Secondary | ICD-10-CM

## 2019-06-17 ENCOUNTER — Other Ambulatory Visit: Payer: Self-pay | Admitting: Nurse Practitioner

## 2019-07-15 ENCOUNTER — Telehealth: Payer: Self-pay | Admitting: Hematology and Oncology

## 2019-07-15 NOTE — Telephone Encounter (Signed)
Scheduled per 6/15 sch message. Unable to reach pt. Left voicemail. appts moved to 8/3. Provider on [facility] 7/30

## 2019-08-09 ENCOUNTER — Ambulatory Visit (INDEPENDENT_AMBULATORY_CARE_PROVIDER_SITE_OTHER): Payer: Medicare PPO | Admitting: Nurse Practitioner

## 2019-08-09 ENCOUNTER — Encounter: Payer: Self-pay | Admitting: Nurse Practitioner

## 2019-08-09 ENCOUNTER — Other Ambulatory Visit: Payer: Self-pay

## 2019-08-09 VITALS — BP 102/60 | HR 75 | Temp 98.1°F | Ht 69.6 in | Wt 205.4 lb

## 2019-08-09 DIAGNOSIS — I10 Essential (primary) hypertension: Secondary | ICD-10-CM | POA: Diagnosis not present

## 2019-08-09 DIAGNOSIS — Z992 Dependence on renal dialysis: Secondary | ICD-10-CM

## 2019-08-09 DIAGNOSIS — R7303 Prediabetes: Secondary | ICD-10-CM | POA: Diagnosis not present

## 2019-08-09 DIAGNOSIS — N186 End stage renal disease: Secondary | ICD-10-CM | POA: Diagnosis not present

## 2019-08-09 DIAGNOSIS — E039 Hypothyroidism, unspecified: Secondary | ICD-10-CM | POA: Diagnosis not present

## 2019-08-09 NOTE — Progress Notes (Signed)
This visit occurred during the SARS-CoV-2 public health emergency.  Safety protocols were in place, including screening questions prior to the visit, additional usage of staff PPE, and extensive cleaning of exam room while observing appropriate contact time as indicated for disinfecting solutions.  Subjective:     Patient ID: Javier Jenkins , male    DOB: 12/10/1950 , 69 y.o.   MRN: [id number]   Chief Complaint  Patient presents with  . Hypertension    HPI  Presents today for 4 month Hypertension follow up. He is doing well with his diet and watching his salt. Weight is stable since last check up. He reports that he is seeing the transplant doctor at [facility] next week. He is staying active and is tolerating all  of his medications. He needs a refill on his synthroid and would like a 90 day supply. He will see his oncologist on August 1st instead of July. He is doing well with HD and is on the same schedule for treatment. He went to the eye doctor and got a good report last month and had no changes to his visions reported. His due for the Hepatitis C screening but has HD and may have documentation thru his dialysis center.  Wt Readings from Last 3 Encounters: 08/09/19 : 205 lb 6.4 oz (93.2 kg) 04/05/19 : 206 lb 12.8 oz (93.8 kg) 01/15/19 : 205 lb (93 kg)  Hypertension This is a chronic problem. The current episode started more than 1 year ago. The problem is unchanged. The problem is controlled. Pertinent negatives include no blurred vision, chest pain, headaches or palpitations. There are no associated agents to hypertension. Risk factors for coronary artery disease include male gender. Past treatments include beta blockers and central alpha agonists. The current treatment provides significant improvement. There are no compliance problems.  Hypertensive end-organ damage includes kidney disease.     Past Medical History:  Diagnosis Date  . Anemia   . Chronic kidney disease    stage 5   Dialysis T/Th/Sa  . Constipation   . Diabetes mellitus without complication ([place])    not on medications  . 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 Hypertension   . Hypothyroidism   . Non-Hodgkin lymphoma of intra-abdominal lymph nodes ([place]) 06/07/2015  . 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 Stroke [facility]) 1998   right side paralysis     Family History  Problem Relation Age of Onset  . Hypertension Mother   . Cancer Paternal Uncle        prostate ca     Current Outpatient Medications:  .  aspirin 325 MG tablet, Take 1 tablet (325 mg total) by mouth daily., Disp: 30 tablet, Rfl: 0 .  atorvastatin (LIPITOR) 10 MG tablet, Take 1 tablet (10 mg total) by mouth daily., Disp: 90 tablet, Rfl: 1 .  cinacalcet (SENSIPAR) 30 MG tablet, Take 30 mg by mouth daily., Disp: , Rfl:  .  ferric citrate (AURYXIA) 1 GM 210 MG(Fe) tablet, Take 420 mg by mouth 3 (three) times daily with meals., Disp: , Rfl:  .  levETIRAcetam (KEPPRA) 500 MG tablet, TAKE 1 TABLET BY MOUTH TWICE A DAY, Disp: 180 tablet, Rfl: 1 .  levothyroxine (SYNTHROID) 25 MCG tablet, TAKE 1 TABLET BY MOUTH EVERY DAY, Disp: 90 tablet, Rfl: 1 .  metoprolol tartrate (LOPRESSOR) 100 MG tablet, TAKE 1 TABLET BY MOUTH TWICE A DAY, Disp: 180 tablet, Rfl: 0 .  multivitamin (RENA-VIT) TABS tablet,  Take 1 tablet by mouth at bedtime., Disp: 90 tablet, Rfl: 0 .  cloNIDine (CATAPRES) 0.2 MG tablet, Take 0.2 mg by mouth daily.  (Patient not taking: Reported on 08/09/2019), Disp: , Rfl:    Allergies  Allergen Reactions  . Tape Dermatitis    Paper tape     Review of Systems  Constitutional: Negative.   Eyes: Negative.  Negative for blurred vision.  Respiratory: Negative.   Cardiovascular: Negative for chest pain, palpitations and leg swelling.  Gastrointestinal: Negative.   Endocrine: Negative.   Genitourinary: Negative.   Musculoskeletal: Negative.   Skin: Negative.   Allergic/Immunologic: Negative.    Neurological: Negative.  Negative for syncope and headaches.  Hematological: Negative.   Psychiatric/Behavioral: Negative.      Today's Vitals   08/09/19 0950  BP: 102/60  Pulse: 75  Temp: 98.1 F (36.7 C)  TempSrc: Oral  Weight: 205 lb 6.4 oz (93.2 kg)  Height: 5' 9.6" (1.768 m)  PainSc: 0-No pain   Body mass index is 29.81 kg/m.   Objective:  Physical Exam Constitutional:      General: He is not in acute distress.    Appearance: Normal appearance.  HENT:     Head: Normocephalic and atraumatic.  Cardiovascular:     Rate and Rhythm: Normal rate and regular rhythm.     Pulses: Normal pulses.     Heart sounds: Normal heart sounds. No murmur heard.      Arteriovenous access: left arteriovenous access is present.    Comments: Positive thrill and bruit to left AV fistula Pulmonary:     Effort: Pulmonary effort is normal. No respiratory distress.     Breath sounds: Normal breath sounds.  Musculoskeletal:     Right lower leg: No edema.     Left lower leg: No edema.     Comments: Right hand contracted and right lower extremity wearing brace   Skin:    General: Skin is warm and dry.     Capillary Refill: Capillary refill takes less than 2 seconds.  Neurological:     General: No focal deficit present.     Mental Status: He is alert and oriented to person, place, and time.     Cranial Nerves: No cranial nerve deficit.     Motor: Weakness present.     Gait: Gait abnormal.     Comments: Chronic Right side hemiparesis  Psychiatric:        Mood and Affect: Mood normal.        Behavior: Behavior normal.        Thought Content: Thought content normal.        Judgment: Judgment normal.         Assessment And Plan:   1. Essential hypertension  Chronic   Well controlled  Continue with current medications  Will obtain labs from [facility] which may include Hepatitis c screening.   2. Prediabetes  Chronic, no current medications  3. Acquired  hypothyroidism  Chronic, excellent control  No labs this visit  4. End stage renal failure on dialysis [facility])  He continues with dialysis and is in the process of starting the process for kidney transplant.    Marylu Lund, RN    [name], DNP, FNP-BC THE PATIENT IS ENCOURAGED TO PRACTICE SOCIAL DISTANCING DUE TO THE COVID-19 PANDEMIC.

## 2019-08-11 ENCOUNTER — Encounter: Payer: Self-pay | Admitting: Nurse Practitioner

## 2019-08-27 ENCOUNTER — Ambulatory Visit: Payer: Medicare Other | Admitting: Hematology and Oncology

## 2019-08-27 ENCOUNTER — Other Ambulatory Visit: Payer: Medicare Other

## 2019-08-31 ENCOUNTER — Other Ambulatory Visit: Payer: Medicare Other

## 2019-08-31 ENCOUNTER — Ambulatory Visit: Payer: Medicare Other | Admitting: Hematology and Oncology

## 2019-09-02 ENCOUNTER — Other Ambulatory Visit: Payer: Self-pay

## 2019-09-02 DIAGNOSIS — C8338 Diffuse large B-cell lymphoma, lymph nodes of multiple sites: Secondary | ICD-10-CM

## 2019-09-03 ENCOUNTER — Other Ambulatory Visit: Payer: Self-pay

## 2019-09-03 ENCOUNTER — Inpatient Hospital Stay: Payer: Medicare PPO | Attending: Hematology and Oncology

## 2019-09-03 ENCOUNTER — Telehealth: Payer: Self-pay | Admitting: Hematology and Oncology

## 2019-09-03 ENCOUNTER — Inpatient Hospital Stay (HOSPITAL_BASED_OUTPATIENT_CLINIC_OR_DEPARTMENT_OTHER): Payer: Medicare PPO | Admitting: Hematology and Oncology

## 2019-09-03 ENCOUNTER — Encounter: Payer: Self-pay | Admitting: Hematology and Oncology

## 2019-09-03 DIAGNOSIS — N186 End stage renal disease: Secondary | ICD-10-CM

## 2019-09-03 DIAGNOSIS — D631 Anemia in chronic kidney disease: Secondary | ICD-10-CM | POA: Diagnosis not present

## 2019-09-03 DIAGNOSIS — Z79899 Other long term (current) drug therapy: Secondary | ICD-10-CM | POA: Diagnosis not present

## 2019-09-03 DIAGNOSIS — Z9181 History of falling: Secondary | ICD-10-CM | POA: Diagnosis not present

## 2019-09-03 DIAGNOSIS — Z992 Dependence on renal dialysis: Secondary | ICD-10-CM | POA: Diagnosis not present

## 2019-09-03 DIAGNOSIS — Z87820 Personal history of traumatic brain injury: Secondary | ICD-10-CM | POA: Insufficient documentation

## 2019-09-03 DIAGNOSIS — C8338 Diffuse large B-cell lymphoma, lymph nodes of multiple sites: Secondary | ICD-10-CM

## 2019-09-03 DIAGNOSIS — I69351 Hemiplegia and hemiparesis following cerebral infarction affecting right dominant side: Secondary | ICD-10-CM | POA: Diagnosis not present

## 2019-09-03 DIAGNOSIS — Z888 Allergy status to other drugs, medicaments and biological substances status: Secondary | ICD-10-CM | POA: Insufficient documentation

## 2019-09-03 LAB — CBC WITH DIFFERENTIAL (CANCER CENTER ONLY)
Abs Immature Granulocytes: 0.02 10*3/uL (ref 0.00–0.07)
Basophils Absolute: 0.1 10*3/uL (ref 0.0–0.1)
Basophils Relative: 1 %
Eosinophils Absolute: 0.9 10*3/uL — ABNORMAL HIGH (ref 0.0–0.5)
Eosinophils Relative: 13 %
HCT: 38.4 % — ABNORMAL LOW (ref 39.0–52.0)
Hemoglobin: 12.1 g/dL — ABNORMAL LOW (ref 13.0–17.0)
Immature Granulocytes: 0 %
Lymphocytes Relative: 27 %
Lymphs Abs: 1.8 10*3/uL (ref 0.7–4.0)
MCH: 31.5 pg (ref 26.0–34.0)
MCHC: 31.5 g/dL (ref 30.0–36.0)
MCV: 100 fL (ref 80.0–100.0)
Monocytes Absolute: 0.6 10*3/uL (ref 0.1–1.0)
Monocytes Relative: 10 %
Neutro Abs: 3.3 10*3/uL (ref 1.7–7.7)
Neutrophils Relative %: 49 %
Platelet Count: 208 10*3/uL (ref 150–400)
RBC: 3.84 MIL/uL — ABNORMAL LOW (ref 4.22–5.81)
RDW: 14.6 % (ref 11.5–15.5)
WBC Count: 6.7 10*3/uL (ref 4.0–10.5)
nRBC: 0 % (ref 0.0–0.2)

## 2019-09-03 NOTE — Progress Notes (Signed)
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 [facility]) He has recent CT imaging done elsewhere which show no evidence of lymphoma recurrence Clinically, he has no signs of cancer recurrence I plan to see him back in 12 months with history and physical examination The patient is educated to watch for signs and symptoms of cancer recurrence We discussed the importance of annual influenza vaccination  End stage renal failure on dialysis [facility]) Patient is receiving hemodialysis on Tuesdays, Thursdays and Saturdays He is wondering about the role of kidney transplant I told him he is not consider a long-term cancer survivor until he reached a 5 years' mark after last dose of chemo  Anemia in chronic kidney disease This is likely anemia of chronic disease. The patient denies recent history of bleeding such as epistaxis, hematuria or hematochezia. He is asymptomatic from the anemia. We will observe for now.  I would defer ESA management to nephrologist.  Hemiparesis affecting right side as late effect of stroke [facility]) The patient had history of fall in 2017 with subdural hematoma. Since then, he is able to manage at home without further falls. He continues to use cane whenever he walks.    No orders of the defined types were placed in this encounter.   All questions were answered. The patient knows to call the clinic with any problems, questions or concerns. The total time spent in the appointment was 20 minutes encounter with patients including review of chart and various tests results, discussions about plan of care and coordination of care plan   Heath Lark, MD 09/03/2019 4:27  PM  INTERVAL HISTORY: Please see below for problem oriented charting. 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 ([place])  06/07/2015 Initial Diagnosis   Non-Hodgkin lymphoma of intra-abdominal lymph nodes ([place])   06/12/2015 Imaging   CT chest showed lymphadenopathy within the low neck and low chest, consistent with active lymphoma   06/14/2015 Procedure   He underwent CT-guided biopsy   06/14/2015 Pathology Results   Biopsy of the retroperitoneal mass confirmed diffuse large B-cell lymphoma   06/21/2015 Imaging   ECHO showed normal EF   06/21/2015 Procedure   He has port placement   06/23/2015 - 10/12/2015 Chemotherapy   He received mini-RCHOP chemo x 6 cycles   08/25/2015 PET scan   Significant interval decrease in size of the bulky retroperitoneal lymphadenopathy suggesting an excellent response to therapy. Residual matted soft tissue density in the retroperitoneum with SUV max of approximately 3.3   11/27/2015 PET scan   Continued improved appearance of the treated lymphoma. Minimal residual matted soft tissue density but no discrete measurable disease and no hypermetabolism to suggest metabolically active tumor.   05/24/2016 Imaging   Unchanged retroperitoneal soft tissue most compatible with history of treated lymphoma. No new or enlarging adenopathy in the chest, abdomen or pelvis. Polycystic renal disease. Bilateral fat containing inguinal hernias. Aortic atherosclerosis.   01/15/2017 Imaging  1. Stable findings of treated tumor in the retroperitoneum and right pelvis. No evidence of recurrent lymphoma in the abdomen or pelvis. 2. New 2.1 cm nodular opacity in the dependent right  lower lobe, nonspecific, more likely inflammatory. No thoracic adenopathy. Recommend attention on follow-up chest CT in 3 months. 3. Polycystic kidneys. Increased thin mural calcification associated with the dominant exophytic 6.2 cm medial lower left renal cyst, which is stable in size and technically indeterminate for neoplasm. Presumably, an IV contrast-enhanced study is precluded by poor renal function. Given the size stability, continued surveillance at CT abdomen is a reasonable option. 4. Chronic findings include: Aortic Atherosclerosis (ICD10-I70.0). Small hiatal hernia. Mild terminal ileum and large bowel diverticulosis. Mild prostatomegaly.   04/17/2017 Imaging   CT chest 1. Stable pleural or subpleural cyst in the right lower lobe. No worrisome pulmonary lesions or acute pulmonary findings. 2. No mediastinal or hilar mass or adenopathy. No supraclavicular or axillary adenopathy. CT abdomen 3. No abdominal/pelvic/inguinal lymphadenopathy. 4. Polycystic kidney disease. 5. No acute abdominal/pelvic findings.   05/12/2017 Procedure   Technically successful tunneled Port catheter removal.   01/12/2018 Procedure   He had negative colonoscopy.  Internal hemorrhoids were noted.  Sessile polyp biopsy came back tubular adenoma    08/16/2019 Imaging   CT imaging studies elsewhere Mildly tortuous, minimally calcified bilateral iliac vasculature.   -Bilateral multicystic kidneys with minimal appreciable renal parenchyma. No definite evidence of malignant appearing lesions; however, lack of contrast significantly limits evaluation.   -Similar-appearing 2.7 cm left hepatic lobe hypodensity probably normal left portal vasculature, less likely focal fatty infiltrate or hemangioma. Given interval stability, nonaggressive process is favored. If clinical concern persists, contrast-enhanced ultrasound or MRI of the abdomen may be performed for further evaluation if clinically indicated     REVIEW  OF SYSTEMS:   Constitutional: Denies fevers, chills or abnormal weight loss Eyes: Denies blurriness of vision Ears, nose, mouth, throat, and face: Denies mucositis or sore throat Respiratory: Denies cough, dyspnea or wheezes Cardiovascular: Denies palpitation, chest discomfort or lower extremity swelling Gastrointestinal:  Denies nausea, heartburn or change in bowel habits Skin: Denies abnormal skin rashes Lymphatics: Denies new lymphadenopathy or easy bruising Neurological:Denies numbness, tingling or new weaknesses Behavioral/Psych: Mood is stable, no new changes  All other systems were reviewed with the patient and are negative.  I have reviewed the past medical history, past surgical history, social history and family history with the patient and they are unchanged from previous note.  ALLERGIES:  is allergic to tape.  MEDICATIONS:  Current Outpatient Medications  Medication Sig Dispense Refill  . aspirin 325 MG tablet Take 1 tablet (325 mg total) by mouth daily. 30 tablet 0  . atorvastatin (LIPITOR) 10 MG tablet Take 1 tablet (10 mg total) by mouth daily. 90 tablet 1  . cinacalcet (SENSIPAR) 30 MG tablet Take 30 mg by mouth daily.    . ferric citrate (AURYXIA) 1 GM 210 MG(Fe) tablet Take 420 mg by mouth 3 (three) times daily with meals.    . levETIRAcetam (KEPPRA) 500 MG tablet TAKE 1 TABLET BY MOUTH TWICE A DAY 180 tablet 1  . levothyroxine (SYNTHROID) 25 MCG tablet TAKE 1 TABLET BY MOUTH EVERY DAY 90 tablet 1  . metoprolol tartrate (LOPRESSOR) 100 MG tablet TAKE 1 TABLET BY MOUTH TWICE A DAY 180 tablet 0  . multivitamin (RENA-VIT) TABS tablet Take 1 tablet by mouth at bedtime. 90 tablet 0   No current facility-administered medications for this visit.  PHYSICAL EXAMINATION: ECOG PERFORMANCE STATUS: 2 - Symptomatic, <50% confined to bed  Vitals:   09/03/19 1025  BP: 119/77  Pulse: 68  Resp: 18  Temp: 98.2 F (36.8 C)  SpO2: 99%   Filed Weights   09/03/19 1025   Weight: 206 lb (93.4 kg)    GENERAL:alert, no distress and comfortable SKIN: skin color, texture, turgor are normal, no rashes or significant lesions EYES: normal, Conjunctiva are pink and non-injected, sclera clear OROPHARYNX:no exudate, no erythema and lips, buccal mucosa, and tongue normal  NECK: supple, thyroid normal size, non-tender, without nodularity LYMPH:  no palpable lymphadenopathy in the cervical, axillary or inguinal LUNGS: clear to auscultation and percussion with normal breathing effort HEART: regular rate & rhythm and no murmurs and no lower extremity edema ABDOMEN:abdomen soft, non-tender and normal bowel sounds Musculoskeletal:no cyanosis of digits and no clubbing  NEURO: alert & oriented x 3 with fluent speech, with chronic right hemiparesis  LABORATORY DATA:  I have reviewed the data as listed    Component Value Date/Time   NA 135 04/05/2019 1615   NA 138 07/08/2016 1052   K 5.9 (H) 04/05/2019 1615   K 4.3 07/08/2016 1052   CL 91 (L) 04/05/2019 1615   CO2 25 04/05/2019 1615   CO2 29 07/08/2016 1052   GLUCOSE 79 04/05/2019 1615   GLUCOSE 105 (H) 09/04/2018 0831   GLUCOSE 87 07/08/2016 1052   BUN 49 (H) 04/05/2019 1615   BUN 42.7 (H) 07/08/2016 1052   CREATININE 9.61 (H) 04/05/2019 1615   CREATININE 8.7 (HH) 07/08/2016 1052   CALCIUM 9.1 04/05/2019 1615   CALCIUM 9.6 07/08/2016 1052   PROT 7.5 04/05/2019 1615   PROT 6.7 07/08/2016 1052   ALBUMIN 4.3 04/05/2019 1615   ALBUMIN 4.4 12/30/2016 1005   ALBUMIN 3.1 (L) 07/08/2016 1052   AST 20 04/05/2019 1615   AST 24 12/30/2016 1005   AST 15 07/08/2016 1052   ALT 14 04/05/2019 1615   ALT 22 12/30/2016 1005   ALT 14 07/08/2016 1052   ALKPHOS 79 04/05/2019 1615   ALKPHOS 72 12/30/2016 1005   ALKPHOS 62 07/08/2016 1052   BILITOT <0.2 04/05/2019 1615   BILITOT 0.41 07/08/2016 1052   GFRNONAA 5 (L) 04/05/2019 1615   GFRAA 6 (L) 04/05/2019 1615    No results found for: SPEP, UPEP  Lab Results   Component Value Date   WBC 6.7 09/03/2019   NEUTROABS 3.3 09/03/2019   HGB 12.1 (L) 09/03/2019   HCT 38.4 (L) 09/03/2019   MCV 100.0 09/03/2019   PLT 208 09/03/2019      Chemistry      Component Value Date/Time   NA 135 04/05/2019 1615   NA 138 07/08/2016 1052   K 5.9 (H) 04/05/2019 1615   K 4.3 07/08/2016 1052   CL 91 (L) 04/05/2019 1615   CO2 25 04/05/2019 1615   CO2 29 07/08/2016 1052   BUN 49 (H) 04/05/2019 1615   BUN 42.7 (H) 07/08/2016 1052   CREATININE 9.61 (H) 04/05/2019 1615   CREATININE 8.7 (HH) 07/08/2016 1052      Component Value Date/Time   CALCIUM 9.1 04/05/2019 1615   CALCIUM 9.6 07/08/2016 1052   ALKPHOS 79 04/05/2019 1615   ALKPHOS 72 12/30/2016 1005   ALKPHOS 62 07/08/2016 1052   AST 20 04/05/2019 1615   AST 24 12/30/2016 1005   AST 15 07/08/2016 1052   ALT 14 04/05/2019 1615   ALT 22 12/30/2016 1005   ALT 14  07/08/2016 1052   BILITOT <0.2 04/05/2019 1615   BILITOT 0.41 07/08/2016 1052

## 2019-09-03 NOTE — Telephone Encounter (Signed)
Scheduled appts per 8/6 sch msg. Gave pt a print out of AVS.

## 2019-09-03 NOTE — Assessment & Plan Note (Signed)
He has recent CT imaging done elsewhere which show no evidence of lymphoma recurrence Clinically, he has no signs of cancer recurrence I plan to see him back in 12 months with history and physical examination The patient is educated to watch for signs and symptoms of cancer recurrence We discussed the importance of annual influenza vaccination

## 2019-09-03 NOTE — Assessment & Plan Note (Signed)
This is likely anemia of chronic disease. The patient denies recent history of bleeding such as epistaxis, hematuria or hematochezia. He is asymptomatic from the anemia. We will observe for now.  I would defer ESA management to nephrologist. 

## 2019-09-03 NOTE — Assessment & Plan Note (Signed)
Patient is receiving hemodialysis on Tuesdays, Thursdays and Saturdays He is wondering about the role of kidney transplant I told him he is not consider a long-term cancer survivor until he reached a 5 years' mark after last dose of chemo

## 2019-09-03 NOTE — Assessment & Plan Note (Signed)
The patient had history of fall in 2017 with subdural hematoma. Since then, he is able to manage at home without further falls. He continues to use cane whenever he walks.

## 2019-09-30 DIAGNOSIS — N186 End stage renal disease: Secondary | ICD-10-CM | POA: Diagnosis not present

## 2019-09-30 DIAGNOSIS — N2581 Secondary hyperparathyroidism of renal origin: Secondary | ICD-10-CM | POA: Diagnosis not present

## 2019-09-30 DIAGNOSIS — Z992 Dependence on renal dialysis: Secondary | ICD-10-CM | POA: Diagnosis not present

## 2019-10-02 DIAGNOSIS — Z992 Dependence on renal dialysis: Secondary | ICD-10-CM | POA: Diagnosis not present

## 2019-10-02 DIAGNOSIS — N186 End stage renal disease: Secondary | ICD-10-CM | POA: Diagnosis not present

## 2019-10-02 DIAGNOSIS — N2581 Secondary hyperparathyroidism of renal origin: Secondary | ICD-10-CM | POA: Diagnosis not present

## 2019-10-05 DIAGNOSIS — Z992 Dependence on renal dialysis: Secondary | ICD-10-CM | POA: Diagnosis not present

## 2019-10-05 DIAGNOSIS — N186 End stage renal disease: Secondary | ICD-10-CM | POA: Diagnosis not present

## 2019-10-05 DIAGNOSIS — N2581 Secondary hyperparathyroidism of renal origin: Secondary | ICD-10-CM | POA: Diagnosis not present

## 2019-10-07 DIAGNOSIS — N186 End stage renal disease: Secondary | ICD-10-CM | POA: Diagnosis not present

## 2019-10-07 DIAGNOSIS — N2581 Secondary hyperparathyroidism of renal origin: Secondary | ICD-10-CM | POA: Diagnosis not present

## 2019-10-07 DIAGNOSIS — Z992 Dependence on renal dialysis: Secondary | ICD-10-CM | POA: Diagnosis not present

## 2019-10-09 DIAGNOSIS — N2581 Secondary hyperparathyroidism of renal origin: Secondary | ICD-10-CM | POA: Diagnosis not present

## 2019-10-09 DIAGNOSIS — N186 End stage renal disease: Secondary | ICD-10-CM | POA: Diagnosis not present

## 2019-10-09 DIAGNOSIS — Z992 Dependence on renal dialysis: Secondary | ICD-10-CM | POA: Diagnosis not present

## 2019-10-12 DIAGNOSIS — N186 End stage renal disease: Secondary | ICD-10-CM | POA: Diagnosis not present

## 2019-10-12 DIAGNOSIS — Z992 Dependence on renal dialysis: Secondary | ICD-10-CM | POA: Diagnosis not present

## 2019-10-12 DIAGNOSIS — N2581 Secondary hyperparathyroidism of renal origin: Secondary | ICD-10-CM | POA: Diagnosis not present

## 2019-10-14 DIAGNOSIS — Z992 Dependence on renal dialysis: Secondary | ICD-10-CM | POA: Diagnosis not present

## 2019-10-14 DIAGNOSIS — N2581 Secondary hyperparathyroidism of renal origin: Secondary | ICD-10-CM | POA: Diagnosis not present

## 2019-10-14 DIAGNOSIS — N186 End stage renal disease: Secondary | ICD-10-CM | POA: Diagnosis not present

## 2019-10-16 DIAGNOSIS — N2581 Secondary hyperparathyroidism of renal origin: Secondary | ICD-10-CM | POA: Diagnosis not present

## 2019-10-16 DIAGNOSIS — Z992 Dependence on renal dialysis: Secondary | ICD-10-CM | POA: Diagnosis not present

## 2019-10-16 DIAGNOSIS — N186 End stage renal disease: Secondary | ICD-10-CM | POA: Diagnosis not present

## 2019-10-19 DIAGNOSIS — Z992 Dependence on renal dialysis: Secondary | ICD-10-CM | POA: Diagnosis not present

## 2019-10-19 DIAGNOSIS — N186 End stage renal disease: Secondary | ICD-10-CM | POA: Diagnosis not present

## 2019-10-19 DIAGNOSIS — N2581 Secondary hyperparathyroidism of renal origin: Secondary | ICD-10-CM | POA: Diagnosis not present

## 2019-10-21 DIAGNOSIS — N2581 Secondary hyperparathyroidism of renal origin: Secondary | ICD-10-CM | POA: Diagnosis not present

## 2019-10-21 DIAGNOSIS — Z992 Dependence on renal dialysis: Secondary | ICD-10-CM | POA: Diagnosis not present

## 2019-10-21 DIAGNOSIS — N186 End stage renal disease: Secondary | ICD-10-CM | POA: Diagnosis not present

## 2019-10-23 DIAGNOSIS — N186 End stage renal disease: Secondary | ICD-10-CM | POA: Diagnosis not present

## 2019-10-23 DIAGNOSIS — N2581 Secondary hyperparathyroidism of renal origin: Secondary | ICD-10-CM | POA: Diagnosis not present

## 2019-10-23 DIAGNOSIS — Z992 Dependence on renal dialysis: Secondary | ICD-10-CM | POA: Diagnosis not present

## 2019-10-28 DIAGNOSIS — I129 Hypertensive chronic kidney disease with stage 1 through stage 4 chronic kidney disease, or unspecified chronic kidney disease: Secondary | ICD-10-CM | POA: Diagnosis not present

## 2019-10-28 DIAGNOSIS — N2581 Secondary hyperparathyroidism of renal origin: Secondary | ICD-10-CM | POA: Diagnosis not present

## 2019-10-28 DIAGNOSIS — Z992 Dependence on renal dialysis: Secondary | ICD-10-CM | POA: Diagnosis not present

## 2019-10-28 DIAGNOSIS — N186 End stage renal disease: Secondary | ICD-10-CM | POA: Diagnosis not present

## 2019-10-30 DIAGNOSIS — Z992 Dependence on renal dialysis: Secondary | ICD-10-CM | POA: Diagnosis not present

## 2019-10-30 DIAGNOSIS — N2581 Secondary hyperparathyroidism of renal origin: Secondary | ICD-10-CM | POA: Diagnosis not present

## 2019-10-30 DIAGNOSIS — N186 End stage renal disease: Secondary | ICD-10-CM | POA: Diagnosis not present

## 2019-11-02 DIAGNOSIS — N2581 Secondary hyperparathyroidism of renal origin: Secondary | ICD-10-CM | POA: Diagnosis not present

## 2019-11-02 DIAGNOSIS — N186 End stage renal disease: Secondary | ICD-10-CM | POA: Diagnosis not present

## 2019-11-02 DIAGNOSIS — Z992 Dependence on renal dialysis: Secondary | ICD-10-CM | POA: Diagnosis not present

## 2019-11-04 DIAGNOSIS — Z992 Dependence on renal dialysis: Secondary | ICD-10-CM | POA: Diagnosis not present

## 2019-11-04 DIAGNOSIS — N186 End stage renal disease: Secondary | ICD-10-CM | POA: Diagnosis not present

## 2019-11-04 DIAGNOSIS — N2581 Secondary hyperparathyroidism of renal origin: Secondary | ICD-10-CM | POA: Diagnosis not present

## 2019-11-06 DIAGNOSIS — Z992 Dependence on renal dialysis: Secondary | ICD-10-CM | POA: Diagnosis not present

## 2019-11-06 DIAGNOSIS — N2581 Secondary hyperparathyroidism of renal origin: Secondary | ICD-10-CM | POA: Diagnosis not present

## 2019-11-06 DIAGNOSIS — N186 End stage renal disease: Secondary | ICD-10-CM | POA: Diagnosis not present

## 2019-11-09 DIAGNOSIS — Z992 Dependence on renal dialysis: Secondary | ICD-10-CM | POA: Diagnosis not present

## 2019-11-09 DIAGNOSIS — N186 End stage renal disease: Secondary | ICD-10-CM | POA: Diagnosis not present

## 2019-11-09 DIAGNOSIS — N2581 Secondary hyperparathyroidism of renal origin: Secondary | ICD-10-CM | POA: Diagnosis not present

## 2019-11-11 DIAGNOSIS — Z992 Dependence on renal dialysis: Secondary | ICD-10-CM | POA: Diagnosis not present

## 2019-11-11 DIAGNOSIS — N186 End stage renal disease: Secondary | ICD-10-CM | POA: Diagnosis not present

## 2019-11-11 DIAGNOSIS — N2581 Secondary hyperparathyroidism of renal origin: Secondary | ICD-10-CM | POA: Diagnosis not present

## 2019-11-13 DIAGNOSIS — Z992 Dependence on renal dialysis: Secondary | ICD-10-CM | POA: Diagnosis not present

## 2019-11-13 DIAGNOSIS — N2581 Secondary hyperparathyroidism of renal origin: Secondary | ICD-10-CM | POA: Diagnosis not present

## 2019-11-13 DIAGNOSIS — N186 End stage renal disease: Secondary | ICD-10-CM | POA: Diagnosis not present

## 2019-11-16 DIAGNOSIS — N2581 Secondary hyperparathyroidism of renal origin: Secondary | ICD-10-CM | POA: Diagnosis not present

## 2019-11-16 DIAGNOSIS — N186 End stage renal disease: Secondary | ICD-10-CM | POA: Diagnosis not present

## 2019-11-16 DIAGNOSIS — Z992 Dependence on renal dialysis: Secondary | ICD-10-CM | POA: Diagnosis not present

## 2019-11-18 DIAGNOSIS — Z992 Dependence on renal dialysis: Secondary | ICD-10-CM | POA: Diagnosis not present

## 2019-11-18 DIAGNOSIS — N186 End stage renal disease: Secondary | ICD-10-CM | POA: Diagnosis not present

## 2019-11-18 DIAGNOSIS — N2581 Secondary hyperparathyroidism of renal origin: Secondary | ICD-10-CM | POA: Diagnosis not present

## 2019-11-20 DIAGNOSIS — N2581 Secondary hyperparathyroidism of renal origin: Secondary | ICD-10-CM | POA: Diagnosis not present

## 2019-11-20 DIAGNOSIS — N186 End stage renal disease: Secondary | ICD-10-CM | POA: Diagnosis not present

## 2019-11-20 DIAGNOSIS — Z992 Dependence on renal dialysis: Secondary | ICD-10-CM | POA: Diagnosis not present

## 2019-11-23 DIAGNOSIS — N186 End stage renal disease: Secondary | ICD-10-CM | POA: Diagnosis not present

## 2019-11-23 DIAGNOSIS — N2581 Secondary hyperparathyroidism of renal origin: Secondary | ICD-10-CM | POA: Diagnosis not present

## 2019-11-23 DIAGNOSIS — Z992 Dependence on renal dialysis: Secondary | ICD-10-CM | POA: Diagnosis not present

## 2019-11-25 DIAGNOSIS — Z992 Dependence on renal dialysis: Secondary | ICD-10-CM | POA: Diagnosis not present

## 2019-11-25 DIAGNOSIS — N186 End stage renal disease: Secondary | ICD-10-CM | POA: Diagnosis not present

## 2019-11-25 DIAGNOSIS — N2581 Secondary hyperparathyroidism of renal origin: Secondary | ICD-10-CM | POA: Diagnosis not present

## 2019-11-27 DIAGNOSIS — N186 End stage renal disease: Secondary | ICD-10-CM | POA: Diagnosis not present

## 2019-11-27 DIAGNOSIS — N2581 Secondary hyperparathyroidism of renal origin: Secondary | ICD-10-CM | POA: Diagnosis not present

## 2019-11-27 DIAGNOSIS — Z992 Dependence on renal dialysis: Secondary | ICD-10-CM | POA: Diagnosis not present

## 2019-11-28 DIAGNOSIS — Z992 Dependence on renal dialysis: Secondary | ICD-10-CM | POA: Diagnosis not present

## 2019-11-28 DIAGNOSIS — I129 Hypertensive chronic kidney disease with stage 1 through stage 4 chronic kidney disease, or unspecified chronic kidney disease: Secondary | ICD-10-CM | POA: Diagnosis not present

## 2019-11-28 DIAGNOSIS — N186 End stage renal disease: Secondary | ICD-10-CM | POA: Diagnosis not present

## 2019-11-29 ENCOUNTER — Other Ambulatory Visit: Payer: Self-pay | Admitting: Nurse Practitioner

## 2019-11-30 ENCOUNTER — Ambulatory Visit: Payer: PRIVATE HEALTH INSURANCE

## 2019-11-30 ENCOUNTER — Ambulatory Visit: Payer: PRIVATE HEALTH INSURANCE | Admitting: Nurse Practitioner

## 2019-11-30 DIAGNOSIS — Z992 Dependence on renal dialysis: Secondary | ICD-10-CM | POA: Diagnosis not present

## 2019-11-30 DIAGNOSIS — N2581 Secondary hyperparathyroidism of renal origin: Secondary | ICD-10-CM | POA: Diagnosis not present

## 2019-11-30 DIAGNOSIS — N186 End stage renal disease: Secondary | ICD-10-CM | POA: Diagnosis not present

## 2019-12-01 ENCOUNTER — Ambulatory Visit: Payer: PRIVATE HEALTH INSURANCE

## 2019-12-01 ENCOUNTER — Telehealth: Payer: Self-pay

## 2019-12-01 ENCOUNTER — Ambulatory Visit: Payer: PRIVATE HEALTH INSURANCE | Admitting: Nurse Practitioner

## 2019-12-01 NOTE — Telephone Encounter (Signed)
This nurse called patient in order to follow up on today's missed appointments. Patient states that he called and left a message that he needed to cancel. Rescheduled for 01/05/2020.

## 2019-12-02 DIAGNOSIS — N186 End stage renal disease: Secondary | ICD-10-CM | POA: Diagnosis not present

## 2019-12-02 DIAGNOSIS — N2581 Secondary hyperparathyroidism of renal origin: Secondary | ICD-10-CM | POA: Diagnosis not present

## 2019-12-02 DIAGNOSIS — Z992 Dependence on renal dialysis: Secondary | ICD-10-CM | POA: Diagnosis not present

## 2019-12-04 DIAGNOSIS — N2581 Secondary hyperparathyroidism of renal origin: Secondary | ICD-10-CM | POA: Diagnosis not present

## 2019-12-04 DIAGNOSIS — Z992 Dependence on renal dialysis: Secondary | ICD-10-CM | POA: Diagnosis not present

## 2019-12-04 DIAGNOSIS — N186 End stage renal disease: Secondary | ICD-10-CM | POA: Diagnosis not present

## 2019-12-07 DIAGNOSIS — Z992 Dependence on renal dialysis: Secondary | ICD-10-CM | POA: Diagnosis not present

## 2019-12-07 DIAGNOSIS — N186 End stage renal disease: Secondary | ICD-10-CM | POA: Diagnosis not present

## 2019-12-07 DIAGNOSIS — N2581 Secondary hyperparathyroidism of renal origin: Secondary | ICD-10-CM | POA: Diagnosis not present

## 2019-12-09 DIAGNOSIS — Z992 Dependence on renal dialysis: Secondary | ICD-10-CM | POA: Diagnosis not present

## 2019-12-09 DIAGNOSIS — N2581 Secondary hyperparathyroidism of renal origin: Secondary | ICD-10-CM | POA: Diagnosis not present

## 2019-12-09 DIAGNOSIS — N186 End stage renal disease: Secondary | ICD-10-CM | POA: Diagnosis not present

## 2019-12-11 DIAGNOSIS — N2581 Secondary hyperparathyroidism of renal origin: Secondary | ICD-10-CM | POA: Diagnosis not present

## 2019-12-11 DIAGNOSIS — N186 End stage renal disease: Secondary | ICD-10-CM | POA: Diagnosis not present

## 2019-12-11 DIAGNOSIS — Z992 Dependence on renal dialysis: Secondary | ICD-10-CM | POA: Diagnosis not present

## 2019-12-14 DIAGNOSIS — Z992 Dependence on renal dialysis: Secondary | ICD-10-CM | POA: Diagnosis not present

## 2019-12-14 DIAGNOSIS — N186 End stage renal disease: Secondary | ICD-10-CM | POA: Diagnosis not present

## 2019-12-14 DIAGNOSIS — N2581 Secondary hyperparathyroidism of renal origin: Secondary | ICD-10-CM | POA: Diagnosis not present

## 2019-12-16 DIAGNOSIS — Z992 Dependence on renal dialysis: Secondary | ICD-10-CM | POA: Diagnosis not present

## 2019-12-16 DIAGNOSIS — N186 End stage renal disease: Secondary | ICD-10-CM | POA: Diagnosis not present

## 2019-12-16 DIAGNOSIS — N2581 Secondary hyperparathyroidism of renal origin: Secondary | ICD-10-CM | POA: Diagnosis not present

## 2019-12-18 DIAGNOSIS — Z992 Dependence on renal dialysis: Secondary | ICD-10-CM | POA: Diagnosis not present

## 2019-12-18 DIAGNOSIS — N186 End stage renal disease: Secondary | ICD-10-CM | POA: Diagnosis not present

## 2019-12-18 DIAGNOSIS — N2581 Secondary hyperparathyroidism of renal origin: Secondary | ICD-10-CM | POA: Diagnosis not present

## 2019-12-20 ENCOUNTER — Other Ambulatory Visit: Payer: Self-pay | Admitting: Nurse Practitioner

## 2019-12-20 DIAGNOSIS — Z992 Dependence on renal dialysis: Secondary | ICD-10-CM | POA: Diagnosis not present

## 2019-12-20 DIAGNOSIS — N186 End stage renal disease: Secondary | ICD-10-CM | POA: Diagnosis not present

## 2019-12-20 DIAGNOSIS — N2581 Secondary hyperparathyroidism of renal origin: Secondary | ICD-10-CM | POA: Diagnosis not present

## 2019-12-22 DIAGNOSIS — N2581 Secondary hyperparathyroidism of renal origin: Secondary | ICD-10-CM | POA: Diagnosis not present

## 2019-12-22 DIAGNOSIS — Z992 Dependence on renal dialysis: Secondary | ICD-10-CM | POA: Diagnosis not present

## 2019-12-22 DIAGNOSIS — N186 End stage renal disease: Secondary | ICD-10-CM | POA: Diagnosis not present

## 2019-12-25 DIAGNOSIS — N186 End stage renal disease: Secondary | ICD-10-CM | POA: Diagnosis not present

## 2019-12-25 DIAGNOSIS — N2581 Secondary hyperparathyroidism of renal origin: Secondary | ICD-10-CM | POA: Diagnosis not present

## 2019-12-25 DIAGNOSIS — Z992 Dependence on renal dialysis: Secondary | ICD-10-CM | POA: Diagnosis not present

## 2019-12-28 DIAGNOSIS — N186 End stage renal disease: Secondary | ICD-10-CM | POA: Diagnosis not present

## 2019-12-28 DIAGNOSIS — N2581 Secondary hyperparathyroidism of renal origin: Secondary | ICD-10-CM | POA: Diagnosis not present

## 2019-12-28 DIAGNOSIS — Z992 Dependence on renal dialysis: Secondary | ICD-10-CM | POA: Diagnosis not present

## 2019-12-28 DIAGNOSIS — I129 Hypertensive chronic kidney disease with stage 1 through stage 4 chronic kidney disease, or unspecified chronic kidney disease: Secondary | ICD-10-CM | POA: Diagnosis not present

## 2019-12-30 DIAGNOSIS — N186 End stage renal disease: Secondary | ICD-10-CM | POA: Diagnosis not present

## 2019-12-30 DIAGNOSIS — Z992 Dependence on renal dialysis: Secondary | ICD-10-CM | POA: Diagnosis not present

## 2019-12-30 DIAGNOSIS — N2581 Secondary hyperparathyroidism of renal origin: Secondary | ICD-10-CM | POA: Diagnosis not present

## 2019-12-30 LAB — BASIC METABOLIC PANEL
BUN: 51 — AB (ref 4–21)
Chloride: 95 — AB (ref 99–108)
Potassium: 5.7 — AB (ref 3.4–5.3)
Sodium: 138 (ref 137–147)

## 2019-12-30 LAB — CBC AND DIFFERENTIAL
Hemoglobin: 12.6 — AB (ref 13.5–17.5)
Platelets: 221 (ref 150–399)
WBC: 6

## 2019-12-30 LAB — CBC: RBC: 3.66 — AB (ref 3.87–5.11)

## 2019-12-30 LAB — IRON,TIBC AND FERRITIN PANEL
Iron: 82
TIBC: 228
UIBC: 146

## 2019-12-30 LAB — COMPREHENSIVE METABOLIC PANEL: Albumin: 3.8 (ref 3.5–5.0)

## 2020-01-01 ENCOUNTER — Other Ambulatory Visit: Payer: Self-pay | Admitting: Nurse Practitioner

## 2020-01-01 DIAGNOSIS — Z992 Dependence on renal dialysis: Secondary | ICD-10-CM | POA: Diagnosis not present

## 2020-01-01 DIAGNOSIS — R7303 Prediabetes: Secondary | ICD-10-CM

## 2020-01-01 DIAGNOSIS — N2581 Secondary hyperparathyroidism of renal origin: Secondary | ICD-10-CM | POA: Diagnosis not present

## 2020-01-01 DIAGNOSIS — N186 End stage renal disease: Secondary | ICD-10-CM | POA: Diagnosis not present

## 2020-01-04 DIAGNOSIS — N2581 Secondary hyperparathyroidism of renal origin: Secondary | ICD-10-CM | POA: Diagnosis not present

## 2020-01-04 DIAGNOSIS — Z992 Dependence on renal dialysis: Secondary | ICD-10-CM | POA: Diagnosis not present

## 2020-01-04 DIAGNOSIS — N186 End stage renal disease: Secondary | ICD-10-CM | POA: Diagnosis not present

## 2020-01-06 DIAGNOSIS — Z992 Dependence on renal dialysis: Secondary | ICD-10-CM | POA: Diagnosis not present

## 2020-01-06 DIAGNOSIS — N2581 Secondary hyperparathyroidism of renal origin: Secondary | ICD-10-CM | POA: Diagnosis not present

## 2020-01-06 DIAGNOSIS — N186 End stage renal disease: Secondary | ICD-10-CM | POA: Diagnosis not present

## 2020-01-08 DIAGNOSIS — Z992 Dependence on renal dialysis: Secondary | ICD-10-CM | POA: Diagnosis not present

## 2020-01-08 DIAGNOSIS — N2581 Secondary hyperparathyroidism of renal origin: Secondary | ICD-10-CM | POA: Diagnosis not present

## 2020-01-08 DIAGNOSIS — N186 End stage renal disease: Secondary | ICD-10-CM | POA: Diagnosis not present

## 2020-01-10 DIAGNOSIS — L602 Onychogryphosis: Secondary | ICD-10-CM | POA: Diagnosis not present

## 2020-01-10 DIAGNOSIS — E1351 Other specified diabetes mellitus with diabetic peripheral angiopathy without gangrene: Secondary | ICD-10-CM | POA: Diagnosis not present

## 2020-01-11 DIAGNOSIS — N2581 Secondary hyperparathyroidism of renal origin: Secondary | ICD-10-CM | POA: Diagnosis not present

## 2020-01-11 DIAGNOSIS — Z992 Dependence on renal dialysis: Secondary | ICD-10-CM | POA: Diagnosis not present

## 2020-01-11 DIAGNOSIS — N186 End stage renal disease: Secondary | ICD-10-CM | POA: Diagnosis not present

## 2020-01-12 ENCOUNTER — Other Ambulatory Visit: Payer: Self-pay

## 2020-01-12 ENCOUNTER — Encounter: Payer: Self-pay | Admitting: Nurse Practitioner

## 2020-01-12 ENCOUNTER — Ambulatory Visit (INDEPENDENT_AMBULATORY_CARE_PROVIDER_SITE_OTHER): Payer: Medicare PPO | Admitting: Nurse Practitioner

## 2020-01-12 ENCOUNTER — Ambulatory Visit (INDEPENDENT_AMBULATORY_CARE_PROVIDER_SITE_OTHER): Payer: Medicare PPO

## 2020-01-12 VITALS — BP 122/82 | HR 85 | Temp 97.9°F | Ht 70.0 in | Wt 206.4 lb

## 2020-01-12 DIAGNOSIS — I700 Atherosclerosis of aorta: Secondary | ICD-10-CM

## 2020-01-12 DIAGNOSIS — I770 Arteriovenous fistula, acquired: Secondary | ICD-10-CM

## 2020-01-12 DIAGNOSIS — C8338 Diffuse large B-cell lymphoma, lymph nodes of multiple sites: Secondary | ICD-10-CM | POA: Diagnosis not present

## 2020-01-12 DIAGNOSIS — E039 Hypothyroidism, unspecified: Secondary | ICD-10-CM | POA: Diagnosis not present

## 2020-01-12 DIAGNOSIS — I10 Essential (primary) hypertension: Secondary | ICD-10-CM

## 2020-01-12 DIAGNOSIS — Z992 Dependence on renal dialysis: Secondary | ICD-10-CM | POA: Diagnosis not present

## 2020-01-12 DIAGNOSIS — N186 End stage renal disease: Secondary | ICD-10-CM | POA: Diagnosis not present

## 2020-01-12 DIAGNOSIS — I129 Hypertensive chronic kidney disease with stage 1 through stage 4 chronic kidney disease, or unspecified chronic kidney disease: Secondary | ICD-10-CM | POA: Diagnosis not present

## 2020-01-12 DIAGNOSIS — R7303 Prediabetes: Secondary | ICD-10-CM | POA: Diagnosis not present

## 2020-01-12 DIAGNOSIS — Z0000 Encounter for general adult medical examination without abnormal findings: Secondary | ICD-10-CM | POA: Diagnosis not present

## 2020-01-12 NOTE — Patient Instructions (Signed)

## 2020-01-12 NOTE — Progress Notes (Signed)
This visit occurred during the SARS-CoV-2 public health emergency.  Safety protocols were in place, including screening questions prior to the visit, additional usage of staff PPE, and extensive cleaning of exam room while observing appropriate contact time as indicated for disinfecting solutions.  Subjective:   Javier Jenkins is a 69 y.o. male who presents for Medicare Annual/Subsequent preventive examination.  Review of Systems     Cardiac Risk Factors include: advanced age (>9men, >75 women);male gender;hypertension     Objective:    Today's Vitals   01/12/20 1539  BP: 122/82  Pulse: 85  Temp: 97.9 F (36.6 C)  TempSrc: Oral  SpO2: 97%  Weight: 206 lb 6.4 oz (93.6 kg)  Height: 5\' 10"  (1.778 m)   Body mass index is 29.62 kg/m.  Advanced Directives 01/12/2020 11/24/2018 08/28/2018 08/19/2018 07/11/2018 11/13/2017 05/12/2017  Does Patient Have a Medical Advance Directive? No No No No No No No  Would patient like information on creating a medical advance directive? - - No - Patient declined No - Patient declined No - Patient declined No - Patient declined -    Current Medications (verified) Outpatient Encounter Medications as of 01/12/2020  Medication Sig  . aspirin 325 MG tablet Take 1 tablet (325 mg total) by mouth daily.  Marland Kitchen atorvastatin (LIPITOR) 10 MG tablet TAKE 1 TABLET BY MOUTH EVERY DAY  . cinacalcet (SENSIPAR) 30 MG tablet Take 30 mg by mouth daily.  . ferric citrate (AURYXIA) 1 GM 210 MG(Fe) tablet Take 420 mg by mouth 3 (three) times daily with meals.  . levETIRAcetam (KEPPRA) 500 MG tablet TAKE 1 TABLET BY MOUTH TWICE A DAY  . levothyroxine (SYNTHROID) 25 MCG tablet TAKE 1 TABLET BY MOUTH EVERY DAY  . metoprolol tartrate (LOPRESSOR) 100 MG tablet TAKE 1 TABLET BY MOUTH TWICE A DAY  . multivitamin (RENA-VIT) TABS tablet Take 1 tablet by mouth at bedtime.   No facility-administered encounter medications on file as of 01/12/2020.    Allergies (verified) Tape    History: Past Medical History:  Diagnosis Date  . Anemia   . Chronic kidney disease    stage 5  Dialysis T/Th/Sa  . Constipation   . Diabetes mellitus without complication ([place])    not on medications  . 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 Hypertension   . Hypothyroidism   . Non-Hodgkin lymphoma of intra-abdominal lymph nodes ([place]) 06/07/2015  . 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 Stroke [facility]) 1998   right side paralysis   Past Surgical History:  Procedure Laterality Date  . AV FISTULA PLACEMENT Left 10/18/2014   Procedure: ARTERIOVENOUS (AV) FISTULA CREATION;  Surgeon: Angelia Mould, MD;  Location: [facility];  Service: Vascular;  Laterality: Left;  . BURR HOLE Right 03/17/2015   Procedure: BURR HOLES FOR RIGHT SUBDURAL HEMATOMA;  Surgeon: Leeroy Cha, MD;  Location: [facility];  Service: Neurosurgery;  Laterality: Right;  . COLONOSCOPY    . HEMORRHOID SURGERY    . IR REMOVAL TUN ACCESS W/ PORT W/O FL MOD SED  05/12/2017  . REVISON OF ARTERIOVENOUS FISTULA Left 09/04/2018   Procedure: EXCISION OF ULCER ON LEFT ARM ARTERIOVENOUS FISTULA;  Surgeon: Angelia Mould, MD;  Location: [facility] OR;  Service: Vascular;  Laterality: Left;   Family History  Problem Relation Age of Onset  . Hypertension Mother   . Cancer Paternal Uncle        prostate ca   Social History   Socioeconomic History  .  Marital status: Married    Spouse name: Not on file  . Number of children: Not on file  . Years of education: Not on file  . Highest education level: Not on file  Occupational History  . Occupation: retired  Tobacco Use  . Smoking status: Former Smoker    Types: Pipe    Quit date: 09/22/1984    Years since quitting: 35.3  . Smokeless tobacco: Never Used  Vaping Use  . Vaping Use: Never used  Substance and Sexual Activity  . Alcohol use: No    Alcohol/week: 0.0 standard drinks  . Drug use: No  . 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 Not on file  Social History Narrative  . Not on file   Social Determinants of Health   Financial Resource Strain: Low Risk   . Difficulty of Paying Living Expenses: Not hard at all  Food Insecurity: No Food Insecurity  . Worried About Charity fundraiser in the Last Year: Never true  . Ran Out of Food in the Last Year: Never true  Transportation Needs: No Transportation Needs  . Lack of Transportation (Medical): No  . Lack of Transportation (Non-Medical): No  Physical Activity: Insufficiently Active  . Days of Exercise per Week: 7 days  . Minutes of Exercise per Session: 20 min  Stress: No Stress Concern Present  . Feeling of Stress : Not at all  Social Connections: Not on file    Tobacco Counseling Counseling given: Not Answered   Clinical Intake:  Pre-visit preparation completed: Yes  Pain : No/denies pain     Nutritional Status: BMI 25 -29 Overweight Nutritional Risks: None Diabetes: No  How often do you need to have someone help you when you read instructions, pamphlets, or other written materials from your doctor or pharmacy?: 1 - Never What is the last grade level you completed in school?: master's degree  Diabetic? no  Interpreter Needed?: No  Information entered by :: NAllen LPN   Activities of Daily Living In your present state of health, do you have any difficulty performing the following activities: 01/12/2020  Hearing? N  Vision? N  Difficulty concentrating or making decisions? N  Walking or climbing stairs? N  Comment just takes time  Dressing or bathing? N  Doing errands, shopping? N  Preparing Food and eating ? N  Using the Toilet? N  In the past six months, have you accidently leaked urine? N  Do you have problems with loss of bowel control? N  Managing your Medications? N  Managing your Finances? N  Housekeeping or managing your Housekeeping? N  Some recent data might be  hidden    Patient Care Team: [name], MD as PCP - General (Internal Medicine) [name], MD as Consulting Physician (Nephrology) Heath Lark, MD as Consulting Physician (Hematology and Oncology) [facility] [place] any recent Medical Services you may have received from other than [facility] providers in the past year (date may be approximate).     Assessment:   This is a routine wellness examination for Keishon.  Hearing/Vision screen No exam data present  Dietary issues and exercise activities discussed: Current Exercise Habits: Home exercise routine, Type of exercise: walking, Time (Minutes): 20, Frequency (Times/Week): 7, Weekly Exercise (Minutes/Week): 140  Goals    . Patient Stated     11/24/2018, no goals at this time    .  Patient Stated     01/12/2020, wants kidney transplant      Depression Screen PHQ 2/9 Scores 01/12/2020 04/05/2019 12/07/2018 11/24/2018 07/20/2018 02/20/2018  PHQ - 2 Score 0 0 0 0 0 0  PHQ- 9 Score - - - 0 - -    Fall Risk Fall Risk  01/12/2020 04/05/2019 12/07/2018 11/24/2018 07/20/2018  Falls in the past year? 0 0 0 1 1  Comment - - - tripped out the back door -  Number falls in past yr: - - - 1 0  Injury with Fall? - - - 1 1  Comment - - - fractured shoulder -  Risk for fall due to : Impaired balance/gait;Medication side effect - - History of fall(s);Impaired balance/gait;Medication side effect -  Follow up Falls evaluation completed;Education provided;Falls prevention discussed - - Falls evaluation completed;Falls prevention discussed -    FALL RISK PREVENTION PERTAINING TO THE HOME:  Any stairs in or around the home? Yes  If so, are there any without handrails? No  Home free of loose throw rugs in walkways, pet beds, electrical cords, etc? Yes  Adequate lighting in your home to reduce risk of falls? Yes   ASSISTIVE DEVICES UTILIZED TO PREVENT FALLS:  Life alert? No  Use of a cane, walker or w/c? No  Grab bars  in the bathroom? Yes  Shower chair or bench in shower? Yes  Elevated toilet seat or a handicapped toilet? Yes   TIMED UP AND GO:  Was the test performed? No .  .   Gait slow and steady without use of assistive device  Cognitive Function:     6CIT Screen 01/12/2020 11/24/2018  What Year? 0 points 0 points  What month? 0 points 0 points  What time? 0 points 0 points  Count back from 20 0 points 0 points  Months in reverse 0 points 0 points  Repeat phrase 0 points 0 points  Total Score 0 0    Immunizations Immunization History  Administered Date(s) Administered  . Influenza, High Dose Seasonal PF 11/18/2016, 10/22/2018  . Influenza,inj,Quad PF,6+ Mos 11/28/2015  . Moderna Sars-Covid-2 Vaccination 03/11/2019, 04/08/2019    TDAP status: Due, Education has been provided regarding the importance of this vaccine. Advised may receive this vaccine at local pharmacy or Health Dept. Aware to provide a copy of the vaccination record if obtained from local pharmacy or Health Dept. Verbalized acceptance and understanding.  Flu Vaccine status: Up to date  Pneumococcal vaccine status: Declined,  Education has been provided regarding the importance of this vaccine but patient still declined. Advised may receive this vaccine at local pharmacy or Health Dept. Aware to provide a copy of the vaccination record if obtained from local pharmacy or Health Dept. Verbalized acceptance and understanding.   Covid-19 vaccine status: Completed vaccines  Qualifies for Shingles Vaccine? Yes   Zostavax completed No   Shingrix Completed?: No.    Education has been provided regarding the importance of this vaccine. Patient has been advised to call insurance company to determine out of pocket expense if they have not yet received this vaccine. Advised may also receive vaccine at local pharmacy or Health Dept. Verbalized acceptance and understanding.  Screening Tests Health Maintenance  Topic Date Due  .  URINE MICROALBUMIN  Never done  . TETANUS/TDAP  Never done  . PNA vac Low Risk Adult (1 of 2 - PCV13) Never done  . INFLUENZA VACCINE  08/29/2019  . COVID-19 Vaccine (4 - Booster for Moderna series)  06/14/2020  . COLONOSCOPY  01/13/2028  . Hepatitis C Screening  Completed    Health Maintenance  Health Maintenance Due  Topic Date Due  . URINE MICROALBUMIN  Never done  . TETANUS/TDAP  Never done  . PNA vac Low Risk Adult (1 of 2 - PCV13) Never done  . INFLUENZA VACCINE  08/29/2019    Colorectal cancer screening: Type of screening: Colonoscopy. Completed 01/12/2018. Repeat every 10 years  Lung Cancer Screening: (Low Dose CT Chest recommended if Age 75-80 years, 30 pack-year currently smoking OR have quit w/in 15years.) does not qualify.   Lung Cancer Screening Referral: no  Additional Screening:  Hepatitis C Screening: does qualify; Completed 02/04/2019  Vision Screening: Recommended annual ophthalmology exams for early detection of glaucoma and other disorders of the eye. Is the patient up to date with their annual eye exam?  Yes  Who is the provider or what is the name of the office in which the patient attends annual eye exams? Dr. Katy Fitch If pt is not established with a provider, would they like to be referred to a provider to establish care? No .   Dental Screening: Recommended annual dental exams for proper oral hygiene  Community Resource Referral / Chronic Care Management: CRR required this visit?  No   CCM required this visit?  No      Plan:     I have personally reviewed and noted the following in the patient's chart:   . Medical and social history . Use of alcohol, tobacco or illicit drugs  . Current medications and supplements . Functional ability and status . Nutritional status . Physical activity . Advanced directives . List of other physicians . Hospitalizations, surgeries, and ER visits in previous 12 months . Vitals . Screenings to include  cognitive, depression, and falls . Referrals and appointments  In addition, I have reviewed and discussed with patient certain preventive protocols, quality metrics, and best practice recommendations. A written personalized care plan for preventive services as well as general preventive health recommendations were provided to patient.     Kellie Simmering, LPN   [id number]   Nurse Notes:

## 2020-01-12 NOTE — Progress Notes (Signed)
I,Yamilka Roman [employer] as a Education administrator for [employer], FNP.,have documented all relevant documentation on the behalf of [name], FNP,as directed by  [name], FNP while in the presence of [name], [place]. This visit occurred during the SARS-CoV-2 public health emergency.  Safety protocols were in place, including screening questions prior to the visit, additional usage of staff PPE, and extensive cleaning of exam room while observing appropriate contact time as indicated for disinfecting solutions.  Subjective:     Patient ID: Javier Jenkins , male    DOB: 07-17-1950 , 69 y.o.   MRN: [id number]   Chief Complaint  Patient presents with  . Hypertension    HPI  Here for follow up for blood pressure; he is no longer on blood pressure medications. He continues to go to dialysis for TThSa. He is trying to get on the kidney [facility] list [facility].  Continues to see an oncologist once a year for large b cell lymphoma    Past Medical History:  Diagnosis Date  . Anemia   . Chronic kidney disease    stage 5  Dialysis T/Th/Sa  . Constipation   . Diabetes mellitus without complication ([place])    not on medications  . 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 Hypertension   . Hypothyroidism   . Non-Hodgkin lymphoma of intra-abdominal lymph nodes ([place]) 06/07/2015  . 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 Stroke [facility]) 1998   right side paralysis     Family History  Problem Relation Age of Onset  . Hypertension Mother   . Cancer Paternal Uncle        prostate ca     Current Outpatient Medications:  .  aspirin 325 MG tablet, Take 1 tablet (325 mg total) by mouth daily., Disp: 30 tablet, Rfl: 0 .  atorvastatin (LIPITOR) 10 MG tablet, TAKE 1 TABLET BY MOUTH EVERY DAY, Disp: 90 tablet, Rfl: 1 .  cinacalcet (SENSIPAR) 30 MG tablet, Take 30 mg by mouth daily., Disp: , Rfl:  .  ferric citrate (AURYXIA) 1 GM 210 MG(Fe) tablet, Take 420 mg by  mouth 3 (three) times daily with meals., Disp: , Rfl:  .  levETIRAcetam (KEPPRA) 500 MG tablet, TAKE 1 TABLET BY MOUTH TWICE A DAY, Disp: 180 tablet, Rfl: 1 .  levothyroxine (SYNTHROID) 25 MCG tablet, TAKE 1 TABLET BY MOUTH EVERY DAY, Disp: 90 tablet, Rfl: 1 .  metoprolol tartrate (LOPRESSOR) 100 MG tablet, TAKE 1 TABLET BY MOUTH TWICE A DAY, Disp: 180 tablet, Rfl: 0 .  multivitamin (RENA-VIT) TABS tablet, Take 1 tablet by mouth at bedtime., Disp: 90 tablet, Rfl: 0   Allergies  Allergen Reactions  . Tape Dermatitis    Paper tape     Review of Systems  Constitutional: Negative.   HENT: Negative.   Eyes: Negative.   Respiratory: Negative.   Cardiovascular: Negative.   Gastrointestinal: Negative.   Endocrine: Negative.   Genitourinary: Negative.   Musculoskeletal: Negative.   Skin: Negative.   Neurological: Negative.   Hematological: Negative.   Psychiatric/Behavioral: Negative.      Today's Vitals   01/12/20 1551  BP: 122/82  Pulse: 85  Temp: 97.9 F (36.6 C)  TempSrc: Oral  Weight: 206 lb 5.6 oz (93.6 kg)  Height: 5\' 10"  (1.778 m)  PainSc: 0-No pain   Body mass index is 29.61 kg/m.   Objective:  Physical Exam Constitutional:      General: He is not in acute  distress.    Appearance: Normal appearance.  Cardiovascular:     Rate and Rhythm: Normal rate and regular rhythm.     Pulses: Normal pulses.     Heart sounds: Normal heart sounds. No murmur heard.     Arteriovenous access: left arteriovenous access is present.    Comments: Positive thrill and bruit to left AV fistula Pulmonary:     Effort: Pulmonary effort is normal. No respiratory distress.     Breath sounds: Normal breath sounds.  Musculoskeletal:     Comments: Right hand contracted and right lower extremity has a brace  Skin:    Capillary Refill: Capillary refill takes less than 2 seconds.  Neurological:     General: No focal deficit present.     Mental Status: He is alert and oriented to person,  place, and time.     Cranial Nerves: No cranial nerve deficit.  Psychiatric:        Mood and Affect: Mood normal.         Assessment And Plan:     1. Essential hypertension . B/P is controlled.  . Will get copy of his labs . The importance of regular exercise and dietary modification was stressed to the patient.    2. Acquired hypothyroidism  Chronic, controlled  Continue with current medications, tolerating well - T4 - T3, free - TSH  3. Aortic atherosclerosis (HCC)  Tolerating statin well  4. Prediabetes  Chronic, controlled  Continue with current medications  Encouraged to limit intake of sugary foods and drinks  Encouraged to increase physical activity to 150 minutes per week, as tolerated  5. 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 ESRD (end stage renal disease) ([place]) Continues with dialysis TThSat  7. AVF (arteriovenous fistula) ([place]) Good thrill/bruit    Patient was given opportunity to ask questions. Patient verbalized understanding of the plan and was able to repeat key elements of the plan. All questions were answered to their satisfaction.  [name], FNP   I, [name], FNP, have reviewed all documentation for this visit. The documentation on 01/12/20 for the exam, diagnosis, procedures, and orders are all accurate and complete.  THE PATIENT IS ENCOURAGED TO PRACTICE SOCIAL DISTANCING DUE TO THE COVID-19 PANDEMIC.

## 2020-01-12 NOTE — Patient Instructions (Signed)
Javier Jenkins , Thank you for taking time to come for your Medicare Wellness Visit. I appreciate your ongoing commitment to your health goals. Please review the following plan we discussed and let me know if I can assist you in the future.   Screening recommendations/referrals: Colonoscopy: completed 01/12/2018, due 01/13/2028 Recommended yearly ophthalmology/optometry visit for glaucoma screening and checkup Recommended yearly dental visit for hygiene and checkup  Vaccinations: Influenza vaccine: completed 10/28/2019 Pneumococcal vaccine: decline Tdap vaccine: decline Shingles vaccine: discussed   Covid-19:  12/16/2019, 04/08/2019, 03/11/2019  Advanced directives: Advance directive discussed with you today. Even though you declined this today please call our office should you change your mind and we can give you the proper paperwork for you to fill out.  Conditions/risks identified: none  Next appointment: Follow up in one year for your annual wellness visit.   Preventive Care 8 Years and Older, Male Preventive care refers to lifestyle choices and visits with your health care provider that can promote health and wellness. What does preventive care include?  A yearly physical exam. This is also called an annual well check.  Dental exams once or twice a year.  Routine eye exams. Ask your health care provider how often you should have your eyes checked.  Personal lifestyle choices, including:  Daily care of your teeth and gums.  Regular physical activity.  Eating a healthy diet.  Avoiding tobacco and drug use.  Limiting alcohol use.  Practicing safe sex.  Taking low doses of aspirin every day.  Taking vitamin and mineral supplements as recommended by your health care provider. What happens during an annual well check? The services and screenings done by your health care provider during your annual well check will depend on your age, overall health, lifestyle risk  factors, and family history of disease. Counseling  Your health care provider may ask you questions about your:  Alcohol use.  Tobacco use.  Drug use.  Emotional well-being.  Home and relationship well-being.  Sexual activity.  Eating habits.  History of falls.  Memory and ability to understand (cognition).  Work and work Statistician. Screening  You may have the following tests or measurements:  Height, weight, and BMI.  Blood pressure.  Lipid and cholesterol levels. These may be checked every 5 years, or more frequently if you are over 39 years old.  Skin check.  Lung cancer screening. You may have this screening every year starting at age 33 if you have a 30-pack-year history of smoking and currently smoke or have quit within the past 15 years.  Fecal occult blood test (FOBT) of the stool. You may have this test every year starting at age 47.  Flexible sigmoidoscopy or colonoscopy. You may have a sigmoidoscopy every 5 years or a colonoscopy every 10 years starting at age 22.  Prostate cancer screening. Recommendations will vary depending on your family history and other risks.  Hepatitis C blood test.  Hepatitis B blood test.  Sexually transmitted disease (STD) testing.  Diabetes screening. This is done by checking your blood sugar (glucose) after you have not eaten for a while (fasting). You may have this done every 1-3 years.  Abdominal aortic aneurysm (AAA) screening. You may need this if you are a current or former smoker.  Osteoporosis. You may be screened starting at age 65 if you are at high risk. Talk with your health care provider about your test results, treatment options, and if necessary, the need for more tests. Vaccines  Your health  care provider may recommend certain vaccines, such as:  Influenza vaccine. This is recommended every year.  Tetanus, diphtheria, and acellular pertussis (Tdap, Td) vaccine. You may need a Td booster every 10  years.  Zoster vaccine. You may need this after age 64.  Pneumococcal 13-valent conjugate (PCV13) vaccine. One dose is recommended after age 46.  Pneumococcal polysaccharide (PPSV23) vaccine. One dose is recommended after age 76. Talk to your health care provider about which screenings and vaccines you need and how often you need them. This information is not intended to replace advice given to you by your health care provider. Make sure you discuss any questions you have with your health care provider. Document Released: 02/10/2015 Document Revised: 10/04/2015 Document Reviewed: 11/15/2014 Elsevier Interactive Patient Education  2017 [place] Prevention in the Home Falls can cause injuries. They can happen to people of all ages. There are many things you can do to make your home safe and to help prevent falls. What can I do on the outside of my home?  Regularly fix the edges of walkways and driveways and fix any cracks.  Remove anything that might make you trip as you walk through a door, such as a raised step or threshold.  Trim any bushes or trees on the path to your home.  Use bright outdoor lighting.  Clear any walking paths of anything that might make someone trip, such as rocks or tools.  Regularly check to see if handrails are loose or broken. Make sure that both sides of any steps have handrails.  Any raised decks and porches should have guardrails on the edges.  Have any leaves, snow, or ice cleared regularly.  Use sand or salt on walking paths during winter.  Clean up any spills in your garage right away. This includes oil or grease spills. What can I do in the bathroom?  Use night lights.  Install grab bars by the toilet and in the tub and shower. Do not use towel bars as grab bars.  Use non-skid mats or decals in the tub or shower.  If you need to sit down in the shower, use a plastic, non-slip stool.  Keep the floor dry. Clean up any water that  spills on the floor as soon as it happens.  Remove soap buildup in the tub or shower regularly.  Attach bath mats securely with double-sided non-slip rug tape.  Do not have throw rugs and other things on the floor that can make you trip. What can I do in the bedroom?  Use night lights.  Make sure that you have a light by your bed that is easy to reach.  Do not use any sheets or blankets that are too big for your bed. They should not hang down onto the floor.  Have a firm chair that has side arms. You can use this for support while you get dressed.  Do not have throw rugs and other things on the floor that can make you trip. What can I do in the kitchen?  Clean up any spills right away.  Avoid walking on wet floors.  Keep items that you use a lot in easy-to-reach places.  If you need to reach something above you, use a strong step stool that has a grab bar.  Keep electrical cords out of the way.  Do not use floor polish or wax that makes floors slippery. If you must use wax, use non-skid floor wax.  Do not have  throw rugs and other things on the floor that can make you trip. What can I do with my stairs?  Do not leave any items on the stairs.  Make sure that there are handrails on both sides of the stairs and use them. Fix handrails that are broken or loose. Make sure that handrails are as long as the stairways.  Check any carpeting to make sure that it is firmly attached to the stairs. Fix any carpet that is loose or worn.  Avoid having throw rugs at the top or bottom of the stairs. If you do have throw rugs, attach them to the floor with carpet tape.  Make sure that you have a light switch at the top of the stairs and the bottom of the stairs. If you do not have them, ask someone to add them for you. What else can I do to help prevent falls?  Wear shoes that:  Do not have high heels.  Have rubber bottoms.  Are comfortable and fit you well.  Are closed at the  toe. Do not wear sandals.  If you use a stepladder:  Make sure that it is fully opened. Do not climb a closed stepladder.  Make sure that both sides of the stepladder are locked into place.  Ask someone to hold it for you, if possible.  Clearly mark and make sure that you can see:  Any grab bars or handrails.  First and last steps.  Where the edge of each step is.  Use tools that help you move around (mobility aids) if they are needed. These include:  Canes.  Walkers.  Scooters.  Crutches.  Turn on the lights when you go into a dark area. Replace any light bulbs as soon as they burn out.  Set up your furniture so you have a clear path. Avoid moving your furniture around.  If any of your floors are uneven, fix them.  If there are any pets around you, be aware of where they are.  Review your medicines with your doctor. Some medicines can make you feel dizzy. This can increase your chance of falling. Ask your doctor what other things that you can do to help prevent falls. This information is not intended to replace advice given to you by your health care provider. Make sure you discuss any questions you have with your health care provider. Document Released: 11/10/2008 Document Revised: 06/22/2015 Document Reviewed: 02/18/2014 Elsevier Interactive Patient Education  2017 [employer].

## 2020-01-13 DIAGNOSIS — N186 End stage renal disease: Secondary | ICD-10-CM | POA: Diagnosis not present

## 2020-01-13 DIAGNOSIS — Z992 Dependence on renal dialysis: Secondary | ICD-10-CM | POA: Diagnosis not present

## 2020-01-13 DIAGNOSIS — N2581 Secondary hyperparathyroidism of renal origin: Secondary | ICD-10-CM | POA: Diagnosis not present

## 2020-01-13 LAB — TSH: TSH: 2.73 u[IU]/mL (ref 0.450–4.500)

## 2020-01-13 LAB — T3, FREE: T3, Free: 2.4 pg/mL (ref 2.0–4.4)

## 2020-01-13 LAB — T4: T4, Total: 6.5 ug/dL (ref 4.5–12.0)

## 2020-01-15 DIAGNOSIS — Z992 Dependence on renal dialysis: Secondary | ICD-10-CM | POA: Diagnosis not present

## 2020-01-15 DIAGNOSIS — N186 End stage renal disease: Secondary | ICD-10-CM | POA: Diagnosis not present

## 2020-01-15 DIAGNOSIS — N2581 Secondary hyperparathyroidism of renal origin: Secondary | ICD-10-CM | POA: Diagnosis not present

## 2020-01-18 DIAGNOSIS — N186 End stage renal disease: Secondary | ICD-10-CM | POA: Diagnosis not present

## 2020-01-18 DIAGNOSIS — Z992 Dependence on renal dialysis: Secondary | ICD-10-CM | POA: Diagnosis not present

## 2020-01-18 DIAGNOSIS — N2581 Secondary hyperparathyroidism of renal origin: Secondary | ICD-10-CM | POA: Diagnosis not present

## 2020-01-20 DIAGNOSIS — Z992 Dependence on renal dialysis: Secondary | ICD-10-CM | POA: Diagnosis not present

## 2020-01-20 DIAGNOSIS — N186 End stage renal disease: Secondary | ICD-10-CM | POA: Diagnosis not present

## 2020-01-20 DIAGNOSIS — N2581 Secondary hyperparathyroidism of renal origin: Secondary | ICD-10-CM | POA: Diagnosis not present

## 2020-01-23 DIAGNOSIS — Z992 Dependence on renal dialysis: Secondary | ICD-10-CM | POA: Diagnosis not present

## 2020-01-23 DIAGNOSIS — N2581 Secondary hyperparathyroidism of renal origin: Secondary | ICD-10-CM | POA: Diagnosis not present

## 2020-01-23 DIAGNOSIS — N186 End stage renal disease: Secondary | ICD-10-CM | POA: Diagnosis not present

## 2020-01-25 DIAGNOSIS — Z992 Dependence on renal dialysis: Secondary | ICD-10-CM | POA: Diagnosis not present

## 2020-01-25 DIAGNOSIS — N2581 Secondary hyperparathyroidism of renal origin: Secondary | ICD-10-CM | POA: Diagnosis not present

## 2020-01-25 DIAGNOSIS — N186 End stage renal disease: Secondary | ICD-10-CM | POA: Diagnosis not present

## 2020-01-27 DIAGNOSIS — Z992 Dependence on renal dialysis: Secondary | ICD-10-CM | POA: Diagnosis not present

## 2020-01-27 DIAGNOSIS — N186 End stage renal disease: Secondary | ICD-10-CM | POA: Diagnosis not present

## 2020-01-27 DIAGNOSIS — N2581 Secondary hyperparathyroidism of renal origin: Secondary | ICD-10-CM | POA: Diagnosis not present

## 2020-01-28 DIAGNOSIS — I129 Hypertensive chronic kidney disease with stage 1 through stage 4 chronic kidney disease, or unspecified chronic kidney disease: Secondary | ICD-10-CM | POA: Diagnosis not present

## 2020-01-28 DIAGNOSIS — N186 End stage renal disease: Secondary | ICD-10-CM | POA: Diagnosis not present

## 2020-01-28 DIAGNOSIS — Z992 Dependence on renal dialysis: Secondary | ICD-10-CM | POA: Diagnosis not present

## 2020-01-29 DIAGNOSIS — U071 COVID-19: Secondary | ICD-10-CM

## 2020-01-29 HISTORY — DX: COVID-19: U07.1

## 2020-01-30 DIAGNOSIS — N186 End stage renal disease: Secondary | ICD-10-CM | POA: Diagnosis not present

## 2020-01-30 DIAGNOSIS — Z992 Dependence on renal dialysis: Secondary | ICD-10-CM | POA: Diagnosis not present

## 2020-01-30 DIAGNOSIS — N2581 Secondary hyperparathyroidism of renal origin: Secondary | ICD-10-CM | POA: Diagnosis not present

## 2020-02-01 DIAGNOSIS — N2581 Secondary hyperparathyroidism of renal origin: Secondary | ICD-10-CM | POA: Diagnosis not present

## 2020-02-01 DIAGNOSIS — N186 End stage renal disease: Secondary | ICD-10-CM | POA: Diagnosis not present

## 2020-02-01 DIAGNOSIS — Z992 Dependence on renal dialysis: Secondary | ICD-10-CM | POA: Diagnosis not present

## 2020-02-03 DIAGNOSIS — Z992 Dependence on renal dialysis: Secondary | ICD-10-CM | POA: Diagnosis not present

## 2020-02-03 DIAGNOSIS — N2581 Secondary hyperparathyroidism of renal origin: Secondary | ICD-10-CM | POA: Diagnosis not present

## 2020-02-03 DIAGNOSIS — N186 End stage renal disease: Secondary | ICD-10-CM | POA: Diagnosis not present

## 2020-02-05 DIAGNOSIS — N2581 Secondary hyperparathyroidism of renal origin: Secondary | ICD-10-CM | POA: Diagnosis not present

## 2020-02-05 DIAGNOSIS — N186 End stage renal disease: Secondary | ICD-10-CM | POA: Diagnosis not present

## 2020-02-05 DIAGNOSIS — Z992 Dependence on renal dialysis: Secondary | ICD-10-CM | POA: Diagnosis not present

## 2020-02-08 DIAGNOSIS — Z992 Dependence on renal dialysis: Secondary | ICD-10-CM | POA: Diagnosis not present

## 2020-02-08 DIAGNOSIS — N2581 Secondary hyperparathyroidism of renal origin: Secondary | ICD-10-CM | POA: Diagnosis not present

## 2020-02-08 DIAGNOSIS — N186 End stage renal disease: Secondary | ICD-10-CM | POA: Diagnosis not present

## 2020-02-10 DIAGNOSIS — Z992 Dependence on renal dialysis: Secondary | ICD-10-CM | POA: Diagnosis not present

## 2020-02-10 DIAGNOSIS — N2581 Secondary hyperparathyroidism of renal origin: Secondary | ICD-10-CM | POA: Diagnosis not present

## 2020-02-10 DIAGNOSIS — N186 End stage renal disease: Secondary | ICD-10-CM | POA: Diagnosis not present

## 2020-02-12 DIAGNOSIS — Z992 Dependence on renal dialysis: Secondary | ICD-10-CM | POA: Diagnosis not present

## 2020-02-12 DIAGNOSIS — N2581 Secondary hyperparathyroidism of renal origin: Secondary | ICD-10-CM | POA: Diagnosis not present

## 2020-02-12 DIAGNOSIS — N186 End stage renal disease: Secondary | ICD-10-CM | POA: Diagnosis not present

## 2020-02-15 DIAGNOSIS — N2581 Secondary hyperparathyroidism of renal origin: Secondary | ICD-10-CM | POA: Diagnosis not present

## 2020-02-15 DIAGNOSIS — N186 End stage renal disease: Secondary | ICD-10-CM | POA: Diagnosis not present

## 2020-02-15 DIAGNOSIS — Z992 Dependence on renal dialysis: Secondary | ICD-10-CM | POA: Diagnosis not present

## 2020-02-17 DIAGNOSIS — N2581 Secondary hyperparathyroidism of renal origin: Secondary | ICD-10-CM | POA: Diagnosis not present

## 2020-02-17 DIAGNOSIS — Z992 Dependence on renal dialysis: Secondary | ICD-10-CM | POA: Diagnosis not present

## 2020-02-17 DIAGNOSIS — N186 End stage renal disease: Secondary | ICD-10-CM | POA: Diagnosis not present

## 2020-02-19 DIAGNOSIS — N186 End stage renal disease: Secondary | ICD-10-CM | POA: Diagnosis not present

## 2020-02-19 DIAGNOSIS — Z992 Dependence on renal dialysis: Secondary | ICD-10-CM | POA: Diagnosis not present

## 2020-02-19 DIAGNOSIS — N2581 Secondary hyperparathyroidism of renal origin: Secondary | ICD-10-CM | POA: Diagnosis not present

## 2020-02-22 DIAGNOSIS — N186 End stage renal disease: Secondary | ICD-10-CM | POA: Diagnosis not present

## 2020-02-22 DIAGNOSIS — Z992 Dependence on renal dialysis: Secondary | ICD-10-CM | POA: Diagnosis not present

## 2020-02-22 DIAGNOSIS — N2581 Secondary hyperparathyroidism of renal origin: Secondary | ICD-10-CM | POA: Diagnosis not present

## 2020-02-24 DIAGNOSIS — N186 End stage renal disease: Secondary | ICD-10-CM | POA: Diagnosis not present

## 2020-02-24 DIAGNOSIS — Z992 Dependence on renal dialysis: Secondary | ICD-10-CM | POA: Diagnosis not present

## 2020-02-24 DIAGNOSIS — N2581 Secondary hyperparathyroidism of renal origin: Secondary | ICD-10-CM | POA: Diagnosis not present

## 2020-02-26 DIAGNOSIS — Z992 Dependence on renal dialysis: Secondary | ICD-10-CM | POA: Diagnosis not present

## 2020-02-26 DIAGNOSIS — N186 End stage renal disease: Secondary | ICD-10-CM | POA: Diagnosis not present

## 2020-02-26 DIAGNOSIS — N2581 Secondary hyperparathyroidism of renal origin: Secondary | ICD-10-CM | POA: Diagnosis not present

## 2020-02-28 DIAGNOSIS — I129 Hypertensive chronic kidney disease with stage 1 through stage 4 chronic kidney disease, or unspecified chronic kidney disease: Secondary | ICD-10-CM | POA: Diagnosis not present

## 2020-02-28 DIAGNOSIS — Z992 Dependence on renal dialysis: Secondary | ICD-10-CM | POA: Diagnosis not present

## 2020-02-28 DIAGNOSIS — N186 End stage renal disease: Secondary | ICD-10-CM | POA: Diagnosis not present

## 2020-02-29 DIAGNOSIS — Z992 Dependence on renal dialysis: Secondary | ICD-10-CM | POA: Diagnosis not present

## 2020-02-29 DIAGNOSIS — N2581 Secondary hyperparathyroidism of renal origin: Secondary | ICD-10-CM | POA: Diagnosis not present

## 2020-02-29 DIAGNOSIS — N186 End stage renal disease: Secondary | ICD-10-CM | POA: Diagnosis not present

## 2020-03-02 DIAGNOSIS — Z992 Dependence on renal dialysis: Secondary | ICD-10-CM | POA: Diagnosis not present

## 2020-03-02 DIAGNOSIS — N2581 Secondary hyperparathyroidism of renal origin: Secondary | ICD-10-CM | POA: Diagnosis not present

## 2020-03-02 DIAGNOSIS — N186 End stage renal disease: Secondary | ICD-10-CM | POA: Diagnosis not present

## 2020-03-04 DIAGNOSIS — Z992 Dependence on renal dialysis: Secondary | ICD-10-CM | POA: Diagnosis not present

## 2020-03-04 DIAGNOSIS — N2581 Secondary hyperparathyroidism of renal origin: Secondary | ICD-10-CM | POA: Diagnosis not present

## 2020-03-04 DIAGNOSIS — N186 End stage renal disease: Secondary | ICD-10-CM | POA: Diagnosis not present

## 2020-03-07 DIAGNOSIS — N2581 Secondary hyperparathyroidism of renal origin: Secondary | ICD-10-CM | POA: Diagnosis not present

## 2020-03-07 DIAGNOSIS — Z992 Dependence on renal dialysis: Secondary | ICD-10-CM | POA: Diagnosis not present

## 2020-03-07 DIAGNOSIS — N186 End stage renal disease: Secondary | ICD-10-CM | POA: Diagnosis not present

## 2020-03-09 DIAGNOSIS — Z992 Dependence on renal dialysis: Secondary | ICD-10-CM | POA: Diagnosis not present

## 2020-03-09 DIAGNOSIS — N2581 Secondary hyperparathyroidism of renal origin: Secondary | ICD-10-CM | POA: Diagnosis not present

## 2020-03-09 DIAGNOSIS — N186 End stage renal disease: Secondary | ICD-10-CM | POA: Diagnosis not present

## 2020-03-11 DIAGNOSIS — Z992 Dependence on renal dialysis: Secondary | ICD-10-CM | POA: Diagnosis not present

## 2020-03-11 DIAGNOSIS — N186 End stage renal disease: Secondary | ICD-10-CM | POA: Diagnosis not present

## 2020-03-11 DIAGNOSIS — N2581 Secondary hyperparathyroidism of renal origin: Secondary | ICD-10-CM | POA: Diagnosis not present

## 2020-03-14 DIAGNOSIS — N186 End stage renal disease: Secondary | ICD-10-CM | POA: Diagnosis not present

## 2020-03-14 DIAGNOSIS — N2581 Secondary hyperparathyroidism of renal origin: Secondary | ICD-10-CM | POA: Diagnosis not present

## 2020-03-14 DIAGNOSIS — Z992 Dependence on renal dialysis: Secondary | ICD-10-CM | POA: Diagnosis not present

## 2020-03-16 DIAGNOSIS — N2581 Secondary hyperparathyroidism of renal origin: Secondary | ICD-10-CM | POA: Diagnosis not present

## 2020-03-16 DIAGNOSIS — N186 End stage renal disease: Secondary | ICD-10-CM | POA: Diagnosis not present

## 2020-03-16 DIAGNOSIS — Z992 Dependence on renal dialysis: Secondary | ICD-10-CM | POA: Diagnosis not present

## 2020-03-18 DIAGNOSIS — Z992 Dependence on renal dialysis: Secondary | ICD-10-CM | POA: Diagnosis not present

## 2020-03-18 DIAGNOSIS — N186 End stage renal disease: Secondary | ICD-10-CM | POA: Diagnosis not present

## 2020-03-18 DIAGNOSIS — N2581 Secondary hyperparathyroidism of renal origin: Secondary | ICD-10-CM | POA: Diagnosis not present

## 2020-03-21 DIAGNOSIS — N2581 Secondary hyperparathyroidism of renal origin: Secondary | ICD-10-CM | POA: Diagnosis not present

## 2020-03-21 DIAGNOSIS — Z992 Dependence on renal dialysis: Secondary | ICD-10-CM | POA: Diagnosis not present

## 2020-03-21 DIAGNOSIS — N186 End stage renal disease: Secondary | ICD-10-CM | POA: Diagnosis not present

## 2020-03-23 DIAGNOSIS — N2581 Secondary hyperparathyroidism of renal origin: Secondary | ICD-10-CM | POA: Diagnosis not present

## 2020-03-23 DIAGNOSIS — Z992 Dependence on renal dialysis: Secondary | ICD-10-CM | POA: Diagnosis not present

## 2020-03-23 DIAGNOSIS — N186 End stage renal disease: Secondary | ICD-10-CM | POA: Diagnosis not present

## 2020-03-25 DIAGNOSIS — Z992 Dependence on renal dialysis: Secondary | ICD-10-CM | POA: Diagnosis not present

## 2020-03-25 DIAGNOSIS — N186 End stage renal disease: Secondary | ICD-10-CM | POA: Diagnosis not present

## 2020-03-25 DIAGNOSIS — N2581 Secondary hyperparathyroidism of renal origin: Secondary | ICD-10-CM | POA: Diagnosis not present

## 2020-03-27 DIAGNOSIS — N186 End stage renal disease: Secondary | ICD-10-CM | POA: Diagnosis not present

## 2020-03-27 DIAGNOSIS — Z992 Dependence on renal dialysis: Secondary | ICD-10-CM | POA: Diagnosis not present

## 2020-03-27 DIAGNOSIS — I129 Hypertensive chronic kidney disease with stage 1 through stage 4 chronic kidney disease, or unspecified chronic kidney disease: Secondary | ICD-10-CM | POA: Diagnosis not present

## 2020-03-28 DIAGNOSIS — N2581 Secondary hyperparathyroidism of renal origin: Secondary | ICD-10-CM | POA: Diagnosis not present

## 2020-03-28 DIAGNOSIS — Z992 Dependence on renal dialysis: Secondary | ICD-10-CM | POA: Diagnosis not present

## 2020-03-28 DIAGNOSIS — N186 End stage renal disease: Secondary | ICD-10-CM | POA: Diagnosis not present

## 2020-03-30 DIAGNOSIS — N2581 Secondary hyperparathyroidism of renal origin: Secondary | ICD-10-CM | POA: Diagnosis not present

## 2020-03-30 DIAGNOSIS — N186 End stage renal disease: Secondary | ICD-10-CM | POA: Diagnosis not present

## 2020-03-30 DIAGNOSIS — Z992 Dependence on renal dialysis: Secondary | ICD-10-CM | POA: Diagnosis not present

## 2020-04-01 DIAGNOSIS — N2581 Secondary hyperparathyroidism of renal origin: Secondary | ICD-10-CM | POA: Diagnosis not present

## 2020-04-01 DIAGNOSIS — Z992 Dependence on renal dialysis: Secondary | ICD-10-CM | POA: Diagnosis not present

## 2020-04-01 DIAGNOSIS — N186 End stage renal disease: Secondary | ICD-10-CM | POA: Diagnosis not present

## 2020-04-04 DIAGNOSIS — Z992 Dependence on renal dialysis: Secondary | ICD-10-CM | POA: Diagnosis not present

## 2020-04-04 DIAGNOSIS — N186 End stage renal disease: Secondary | ICD-10-CM | POA: Diagnosis not present

## 2020-04-04 DIAGNOSIS — N2581 Secondary hyperparathyroidism of renal origin: Secondary | ICD-10-CM | POA: Diagnosis not present

## 2020-04-06 DIAGNOSIS — Z992 Dependence on renal dialysis: Secondary | ICD-10-CM | POA: Diagnosis not present

## 2020-04-06 DIAGNOSIS — N2581 Secondary hyperparathyroidism of renal origin: Secondary | ICD-10-CM | POA: Diagnosis not present

## 2020-04-06 DIAGNOSIS — N186 End stage renal disease: Secondary | ICD-10-CM | POA: Diagnosis not present

## 2020-04-08 DIAGNOSIS — N2581 Secondary hyperparathyroidism of renal origin: Secondary | ICD-10-CM | POA: Diagnosis not present

## 2020-04-08 DIAGNOSIS — N186 End stage renal disease: Secondary | ICD-10-CM | POA: Diagnosis not present

## 2020-04-08 DIAGNOSIS — Z992 Dependence on renal dialysis: Secondary | ICD-10-CM | POA: Diagnosis not present

## 2020-04-10 DIAGNOSIS — L602 Onychogryphosis: Secondary | ICD-10-CM | POA: Diagnosis not present

## 2020-04-10 DIAGNOSIS — E1351 Other specified diabetes mellitus with diabetic peripheral angiopathy without gangrene: Secondary | ICD-10-CM | POA: Diagnosis not present

## 2020-04-11 DIAGNOSIS — Z992 Dependence on renal dialysis: Secondary | ICD-10-CM | POA: Diagnosis not present

## 2020-04-11 DIAGNOSIS — N186 End stage renal disease: Secondary | ICD-10-CM | POA: Diagnosis not present

## 2020-04-11 DIAGNOSIS — N2581 Secondary hyperparathyroidism of renal origin: Secondary | ICD-10-CM | POA: Diagnosis not present

## 2020-04-12 ENCOUNTER — Other Ambulatory Visit: Payer: Self-pay

## 2020-04-12 ENCOUNTER — Ambulatory Visit (INDEPENDENT_AMBULATORY_CARE_PROVIDER_SITE_OTHER): Payer: Medicare PPO | Admitting: Nurse Practitioner

## 2020-04-12 ENCOUNTER — Encounter: Payer: Self-pay | Admitting: Nurse Practitioner

## 2020-04-12 VITALS — BP 122/80 | HR 89 | Temp 97.7°F | Ht 70.0 in | Wt 207.0 lb

## 2020-04-12 DIAGNOSIS — Z992 Dependence on renal dialysis: Secondary | ICD-10-CM

## 2020-04-12 DIAGNOSIS — I69351 Hemiplegia and hemiparesis following cerebral infarction affecting right dominant side: Secondary | ICD-10-CM

## 2020-04-12 DIAGNOSIS — E039 Hypothyroidism, unspecified: Secondary | ICD-10-CM

## 2020-04-12 DIAGNOSIS — Z2821 Immunization not carried out because of patient refusal: Secondary | ICD-10-CM

## 2020-04-12 DIAGNOSIS — I129 Hypertensive chronic kidney disease with stage 1 through stage 4 chronic kidney disease, or unspecified chronic kidney disease: Secondary | ICD-10-CM | POA: Diagnosis not present

## 2020-04-12 DIAGNOSIS — C8338 Diffuse large B-cell lymphoma, lymph nodes of multiple sites: Secondary | ICD-10-CM

## 2020-04-12 DIAGNOSIS — I700 Atherosclerosis of aorta: Secondary | ICD-10-CM | POA: Diagnosis not present

## 2020-04-12 DIAGNOSIS — N186 End stage renal disease: Secondary | ICD-10-CM | POA: Diagnosis not present

## 2020-04-12 DIAGNOSIS — I10 Essential (primary) hypertension: Secondary | ICD-10-CM

## 2020-04-12 NOTE — Patient Instructions (Signed)

## 2020-04-12 NOTE — Progress Notes (Signed)
Rutherford Nail as a scribe for [name], FNP.,have documented all relevant documentation on the behalf of [name], FNP,as directed by  [name], FNP while in the presence of [name], [place]. This visit occurred during the SARS-CoV-2 public health emergency.  Safety protocols were in place, including screening questions prior to the visit, additional usage of staff PPE, and extensive cleaning of exam room while observing appropriate contact time as indicated for disinfecting solutions.  Subjective:     Patient ID: Javier Jenkins , male    DOB: 27-May-1950 , 70 y.o.   MRN: [id number]   Chief Complaint  Patient presents with  . Hypertension    HPI  Pt is here today for follow up for blood pressure, he has no other concerns today he has been compliant with all meds. Continues to have hemodialysis 3 days a week. Lives with his wife.   Wt Readings from Last 3 Encounters: 04/12/20 : 207 lb (93.9 kg) 01/12/20 : 206 lb 5.6 oz (93.6 kg) 01/12/20 : 206 lb 6.4 oz (93.6 kg)   Hypertension This is a chronic problem. The current episode started more than 1 year ago. The problem is unchanged. The problem is controlled. Pertinent negatives include no anxiety or headaches. There are no associated agents to hypertension. Risk factors for coronary artery disease include sedentary lifestyle and male gender. Past treatments include beta blockers. The current treatment provides no improvement. Compliance problems: unable to exercise due to history of stroke.  There is no history of angina. There is no history of chronic renal disease.     Past Medical History:  Diagnosis Date  . Anemia   . Chronic kidney disease    stage 5  Dialysis T/Th/Sa  . Constipation   . Diabetes mellitus without complication ([place])    not on medications  . 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 Hypertension   . Hypothyroidism   . Non-Hodgkin lymphoma of  intra-abdominal lymph nodes ([place]) 06/07/2015  . 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 Stroke [facility]) 1998   right side paralysis     Family History  Problem Relation Age of Onset  . Hypertension Mother   . Cancer Paternal Uncle        prostate ca     Current Outpatient Medications:  .  aspirin 325 MG tablet, Take 1 tablet (325 mg total) by mouth daily., Disp: 30 tablet, Rfl: 0 .  atorvastatin (LIPITOR) 10 MG tablet, TAKE 1 TABLET BY MOUTH EVERY DAY, Disp: 90 tablet, Rfl: 1 .  cinacalcet (SENSIPAR) 30 MG tablet, Take 30 mg by mouth daily., Disp: , Rfl:  .  ferric citrate (AURYXIA) 1 GM 210 MG(Fe) tablet, Take 420 mg by mouth 3 (three) times daily with meals., Disp: , Rfl:  .  levETIRAcetam (KEPPRA) 500 MG tablet, TAKE 1 TABLET BY MOUTH TWICE A DAY, Disp: 180 tablet, Rfl: 1 .  levothyroxine (SYNTHROID) 25 MCG tablet, TAKE 1 TABLET BY MOUTH EVERY DAY, Disp: 90 tablet, Rfl: 1 .  metoprolol tartrate (LOPRESSOR) 100 MG tablet, TAKE 1 TABLET BY MOUTH TWICE A DAY, Disp: 180 tablet, Rfl: 0 .  multivitamin (RENA-VIT) TABS tablet, Take 1 tablet by mouth at bedtime., Disp: 90 tablet, Rfl: 0   Allergies  Allergen Reactions  . Tape Dermatitis    Paper tape     Review of Systems  Constitutional: Negative.  Negative for fatigue.  HENT: Negative.   Cardiovascular: Negative.   Endocrine:  Negative for polydipsia, polyphagia and polyuria.  Musculoskeletal: Negative.        Right side hemiparesis  Skin: Negative.   Neurological: Negative for dizziness and headaches.  Psychiatric/Behavioral: Negative.      Today's Vitals   04/12/20 1509  BP: 122/80  Pulse: 89  Temp: 97.7 F (36.5 C)  TempSrc: Oral  Weight: 207 lb (93.9 kg)  Height: 5\' 10"  (1.778 m)   Body mass index is 29.7 kg/m.  Wt Readings from Last 3 Encounters:  04/12/20 207 lb (93.9 kg)  01/12/20 206 lb 5.6 oz (93.6 kg)  01/12/20 206 lb 6.4 oz (93.6 kg)   Objective:  Physical Exam Vitals reviewed.  Constitutional:       General: He is not in acute distress.    Appearance: Normal appearance. He is obese.  HENT:     Head: Normocephalic.     Right Ear: Tympanic membrane, ear canal and external ear normal. There is no impacted cerumen.     Left Ear: Tympanic membrane, ear canal and external ear normal. There is no impacted cerumen.     Nose: Nose normal. No congestion.     Mouth/Throat:     Mouth: Mucous membranes are moist.  Eyes:     Extraocular Movements: Extraocular movements intact.     Conjunctiva/sclera: Conjunctivae normal.     Pupils: Pupils are equal, round, and reactive to light.  Cardiovascular:     Rate and Rhythm: Normal rate and regular rhythm.     Pulses: Normal pulses.     Heart sounds: Normal heart sounds. No murmur heard.   Pulmonary:     Effort: Pulmonary effort is normal. No respiratory distress.     Breath sounds: Normal breath sounds. No wheezing.  Skin:    General: Skin is warm and dry.     Capillary Refill: Capillary refill takes less than 2 seconds.  Neurological:     General: No focal deficit present.     Mental Status: He is alert and oriented to person, place, and time.     Cranial Nerves: No cranial nerve deficit.  Psychiatric:        Mood and Affect: Mood normal.        Behavior: Behavior normal.        Thought Content: Thought content normal.        Judgment: Judgment normal.         Assessment And Plan:     1. Essential hypertension . B/P is well controlled.  . CMP ordered to check renal function.   2. Atherosclerosis of aorta (HCC)  Chronic, continue with statin  3. 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 Hemiparesis affecting right side as late effect of stroke (HCC)  Stable, wears right leg brace  5. End stage renal failure on dialysis (HCC)  Stable  6. Acquired hypothyroidism  Chronic,   Continue with current medications, pending results will make changes  to medications - T4 - T3, free - TSH  7. Tetanus, diphtheria, and acellular pertussis (Tdap) vaccination declined  He is advised if he has a "cut or steps on rusty nail" he needs to have a tetanus  8. Pneumococcal vaccination declined     Patient was given opportunity to ask questions. Patient verbalized understanding of the plan and was able to repeat key elements of the plan. All questions were answered to their satisfaction.  [name], FNP  Teola Bradley, FNP, have reviewed all documentation for this visit. The documentation on 04/26/20 for the exam, diagnosis, procedures, and orders are all accurate and complete.   IF YOU HAVE BEEN REFERRED TO A SPECIALIST, IT MAY TAKE 1-2 WEEKS TO SCHEDULE/PROCESS THE REFERRAL. IF YOU HAVE NOT HEARD FROM US/SPECIALIST IN TWO WEEKS, PLEASE GIVE [place] A CALL AT [phone].   THE PATIENT IS ENCOURAGED TO PRACTICE SOCIAL DISTANCING DUE TO THE COVID-19 PANDEMIC.

## 2020-04-13 DIAGNOSIS — Z992 Dependence on renal dialysis: Secondary | ICD-10-CM | POA: Diagnosis not present

## 2020-04-13 DIAGNOSIS — N2581 Secondary hyperparathyroidism of renal origin: Secondary | ICD-10-CM | POA: Diagnosis not present

## 2020-04-13 DIAGNOSIS — N186 End stage renal disease: Secondary | ICD-10-CM | POA: Diagnosis not present

## 2020-04-15 DIAGNOSIS — N186 End stage renal disease: Secondary | ICD-10-CM | POA: Diagnosis not present

## 2020-04-15 DIAGNOSIS — N2581 Secondary hyperparathyroidism of renal origin: Secondary | ICD-10-CM | POA: Diagnosis not present

## 2020-04-15 DIAGNOSIS — Z992 Dependence on renal dialysis: Secondary | ICD-10-CM | POA: Diagnosis not present

## 2020-04-18 DIAGNOSIS — Z992 Dependence on renal dialysis: Secondary | ICD-10-CM | POA: Diagnosis not present

## 2020-04-18 DIAGNOSIS — N186 End stage renal disease: Secondary | ICD-10-CM | POA: Diagnosis not present

## 2020-04-18 DIAGNOSIS — N2581 Secondary hyperparathyroidism of renal origin: Secondary | ICD-10-CM | POA: Diagnosis not present

## 2020-04-20 DIAGNOSIS — N2581 Secondary hyperparathyroidism of renal origin: Secondary | ICD-10-CM | POA: Diagnosis not present

## 2020-04-20 DIAGNOSIS — N186 End stage renal disease: Secondary | ICD-10-CM | POA: Diagnosis not present

## 2020-04-20 DIAGNOSIS — Z992 Dependence on renal dialysis: Secondary | ICD-10-CM | POA: Diagnosis not present

## 2020-04-22 DIAGNOSIS — N2581 Secondary hyperparathyroidism of renal origin: Secondary | ICD-10-CM | POA: Diagnosis not present

## 2020-04-22 DIAGNOSIS — Z992 Dependence on renal dialysis: Secondary | ICD-10-CM | POA: Diagnosis not present

## 2020-04-22 DIAGNOSIS — N186 End stage renal disease: Secondary | ICD-10-CM | POA: Diagnosis not present

## 2020-04-25 DIAGNOSIS — N2581 Secondary hyperparathyroidism of renal origin: Secondary | ICD-10-CM | POA: Diagnosis not present

## 2020-04-25 DIAGNOSIS — Z992 Dependence on renal dialysis: Secondary | ICD-10-CM | POA: Diagnosis not present

## 2020-04-25 DIAGNOSIS — N186 End stage renal disease: Secondary | ICD-10-CM | POA: Diagnosis not present

## 2020-04-27 DIAGNOSIS — N2581 Secondary hyperparathyroidism of renal origin: Secondary | ICD-10-CM | POA: Diagnosis not present

## 2020-04-27 DIAGNOSIS — N186 End stage renal disease: Secondary | ICD-10-CM | POA: Diagnosis not present

## 2020-04-27 DIAGNOSIS — I129 Hypertensive chronic kidney disease with stage 1 through stage 4 chronic kidney disease, or unspecified chronic kidney disease: Secondary | ICD-10-CM | POA: Diagnosis not present

## 2020-04-27 DIAGNOSIS — Z992 Dependence on renal dialysis: Secondary | ICD-10-CM | POA: Diagnosis not present

## 2020-04-29 DIAGNOSIS — N2581 Secondary hyperparathyroidism of renal origin: Secondary | ICD-10-CM | POA: Diagnosis not present

## 2020-04-29 DIAGNOSIS — Z992 Dependence on renal dialysis: Secondary | ICD-10-CM | POA: Diagnosis not present

## 2020-04-29 DIAGNOSIS — N186 End stage renal disease: Secondary | ICD-10-CM | POA: Diagnosis not present

## 2020-05-02 DIAGNOSIS — Z992 Dependence on renal dialysis: Secondary | ICD-10-CM | POA: Diagnosis not present

## 2020-05-02 DIAGNOSIS — N2581 Secondary hyperparathyroidism of renal origin: Secondary | ICD-10-CM | POA: Diagnosis not present

## 2020-05-02 DIAGNOSIS — N186 End stage renal disease: Secondary | ICD-10-CM | POA: Diagnosis not present

## 2020-05-04 DIAGNOSIS — N186 End stage renal disease: Secondary | ICD-10-CM | POA: Diagnosis not present

## 2020-05-04 DIAGNOSIS — Z992 Dependence on renal dialysis: Secondary | ICD-10-CM | POA: Diagnosis not present

## 2020-05-04 DIAGNOSIS — N2581 Secondary hyperparathyroidism of renal origin: Secondary | ICD-10-CM | POA: Diagnosis not present

## 2020-05-06 DIAGNOSIS — N186 End stage renal disease: Secondary | ICD-10-CM | POA: Diagnosis not present

## 2020-05-06 DIAGNOSIS — N2581 Secondary hyperparathyroidism of renal origin: Secondary | ICD-10-CM | POA: Diagnosis not present

## 2020-05-06 DIAGNOSIS — Z992 Dependence on renal dialysis: Secondary | ICD-10-CM | POA: Diagnosis not present

## 2020-05-09 DIAGNOSIS — Z992 Dependence on renal dialysis: Secondary | ICD-10-CM | POA: Diagnosis not present

## 2020-05-09 DIAGNOSIS — N186 End stage renal disease: Secondary | ICD-10-CM | POA: Diagnosis not present

## 2020-05-09 DIAGNOSIS — N2581 Secondary hyperparathyroidism of renal origin: Secondary | ICD-10-CM | POA: Diagnosis not present

## 2020-05-11 DIAGNOSIS — Z992 Dependence on renal dialysis: Secondary | ICD-10-CM | POA: Diagnosis not present

## 2020-05-11 DIAGNOSIS — N186 End stage renal disease: Secondary | ICD-10-CM | POA: Diagnosis not present

## 2020-05-11 DIAGNOSIS — N2581 Secondary hyperparathyroidism of renal origin: Secondary | ICD-10-CM | POA: Diagnosis not present

## 2020-05-13 DIAGNOSIS — Z992 Dependence on renal dialysis: Secondary | ICD-10-CM | POA: Diagnosis not present

## 2020-05-13 DIAGNOSIS — N186 End stage renal disease: Secondary | ICD-10-CM | POA: Diagnosis not present

## 2020-05-13 DIAGNOSIS — N2581 Secondary hyperparathyroidism of renal origin: Secondary | ICD-10-CM | POA: Diagnosis not present

## 2020-05-16 DIAGNOSIS — N186 End stage renal disease: Secondary | ICD-10-CM | POA: Diagnosis not present

## 2020-05-16 DIAGNOSIS — N2581 Secondary hyperparathyroidism of renal origin: Secondary | ICD-10-CM | POA: Diagnosis not present

## 2020-05-16 DIAGNOSIS — Z992 Dependence on renal dialysis: Secondary | ICD-10-CM | POA: Diagnosis not present

## 2020-05-18 DIAGNOSIS — Z992 Dependence on renal dialysis: Secondary | ICD-10-CM | POA: Diagnosis not present

## 2020-05-18 DIAGNOSIS — N186 End stage renal disease: Secondary | ICD-10-CM | POA: Diagnosis not present

## 2020-05-18 DIAGNOSIS — N2581 Secondary hyperparathyroidism of renal origin: Secondary | ICD-10-CM | POA: Diagnosis not present

## 2020-05-20 DIAGNOSIS — N2581 Secondary hyperparathyroidism of renal origin: Secondary | ICD-10-CM | POA: Diagnosis not present

## 2020-05-20 DIAGNOSIS — Z992 Dependence on renal dialysis: Secondary | ICD-10-CM | POA: Diagnosis not present

## 2020-05-20 DIAGNOSIS — N186 End stage renal disease: Secondary | ICD-10-CM | POA: Diagnosis not present

## 2020-05-23 DIAGNOSIS — N186 End stage renal disease: Secondary | ICD-10-CM | POA: Diagnosis not present

## 2020-05-23 DIAGNOSIS — Z992 Dependence on renal dialysis: Secondary | ICD-10-CM | POA: Diagnosis not present

## 2020-05-23 DIAGNOSIS — N2581 Secondary hyperparathyroidism of renal origin: Secondary | ICD-10-CM | POA: Diagnosis not present

## 2020-05-25 DIAGNOSIS — N2581 Secondary hyperparathyroidism of renal origin: Secondary | ICD-10-CM | POA: Diagnosis not present

## 2020-05-25 DIAGNOSIS — N186 End stage renal disease: Secondary | ICD-10-CM | POA: Diagnosis not present

## 2020-05-25 DIAGNOSIS — Z992 Dependence on renal dialysis: Secondary | ICD-10-CM | POA: Diagnosis not present

## 2020-05-27 DIAGNOSIS — I129 Hypertensive chronic kidney disease with stage 1 through stage 4 chronic kidney disease, or unspecified chronic kidney disease: Secondary | ICD-10-CM | POA: Diagnosis not present

## 2020-05-27 DIAGNOSIS — N186 End stage renal disease: Secondary | ICD-10-CM | POA: Diagnosis not present

## 2020-05-27 DIAGNOSIS — N2581 Secondary hyperparathyroidism of renal origin: Secondary | ICD-10-CM | POA: Diagnosis not present

## 2020-05-27 DIAGNOSIS — Z992 Dependence on renal dialysis: Secondary | ICD-10-CM | POA: Diagnosis not present

## 2020-05-30 DIAGNOSIS — Z992 Dependence on renal dialysis: Secondary | ICD-10-CM | POA: Diagnosis not present

## 2020-05-30 DIAGNOSIS — N186 End stage renal disease: Secondary | ICD-10-CM | POA: Diagnosis not present

## 2020-05-30 DIAGNOSIS — N2581 Secondary hyperparathyroidism of renal origin: Secondary | ICD-10-CM | POA: Diagnosis not present

## 2020-06-01 DIAGNOSIS — N186 End stage renal disease: Secondary | ICD-10-CM | POA: Diagnosis not present

## 2020-06-01 DIAGNOSIS — Z992 Dependence on renal dialysis: Secondary | ICD-10-CM | POA: Diagnosis not present

## 2020-06-01 DIAGNOSIS — N2581 Secondary hyperparathyroidism of renal origin: Secondary | ICD-10-CM | POA: Diagnosis not present

## 2020-06-03 DIAGNOSIS — Z992 Dependence on renal dialysis: Secondary | ICD-10-CM | POA: Diagnosis not present

## 2020-06-03 DIAGNOSIS — N2581 Secondary hyperparathyroidism of renal origin: Secondary | ICD-10-CM | POA: Diagnosis not present

## 2020-06-03 DIAGNOSIS — N186 End stage renal disease: Secondary | ICD-10-CM | POA: Diagnosis not present

## 2020-06-06 DIAGNOSIS — Z992 Dependence on renal dialysis: Secondary | ICD-10-CM | POA: Diagnosis not present

## 2020-06-06 DIAGNOSIS — N2581 Secondary hyperparathyroidism of renal origin: Secondary | ICD-10-CM | POA: Diagnosis not present

## 2020-06-06 DIAGNOSIS — N186 End stage renal disease: Secondary | ICD-10-CM | POA: Diagnosis not present

## 2020-06-10 DIAGNOSIS — N186 End stage renal disease: Secondary | ICD-10-CM | POA: Diagnosis not present

## 2020-06-10 DIAGNOSIS — N2581 Secondary hyperparathyroidism of renal origin: Secondary | ICD-10-CM | POA: Diagnosis not present

## 2020-06-10 DIAGNOSIS — Z992 Dependence on renal dialysis: Secondary | ICD-10-CM | POA: Diagnosis not present

## 2020-06-13 DIAGNOSIS — N186 End stage renal disease: Secondary | ICD-10-CM | POA: Diagnosis not present

## 2020-06-13 DIAGNOSIS — Z992 Dependence on renal dialysis: Secondary | ICD-10-CM | POA: Diagnosis not present

## 2020-06-13 DIAGNOSIS — N2581 Secondary hyperparathyroidism of renal origin: Secondary | ICD-10-CM | POA: Diagnosis not present

## 2020-06-14 ENCOUNTER — Other Ambulatory Visit: Payer: Self-pay | Admitting: Nurse Practitioner

## 2020-06-15 DIAGNOSIS — N186 End stage renal disease: Secondary | ICD-10-CM | POA: Diagnosis not present

## 2020-06-15 DIAGNOSIS — N2581 Secondary hyperparathyroidism of renal origin: Secondary | ICD-10-CM | POA: Diagnosis not present

## 2020-06-15 DIAGNOSIS — Z992 Dependence on renal dialysis: Secondary | ICD-10-CM | POA: Diagnosis not present

## 2020-06-16 ENCOUNTER — Other Ambulatory Visit: Payer: Self-pay | Admitting: Nurse Practitioner

## 2020-06-17 DIAGNOSIS — N186 End stage renal disease: Secondary | ICD-10-CM | POA: Diagnosis not present

## 2020-06-17 DIAGNOSIS — N2581 Secondary hyperparathyroidism of renal origin: Secondary | ICD-10-CM | POA: Diagnosis not present

## 2020-06-17 DIAGNOSIS — Z992 Dependence on renal dialysis: Secondary | ICD-10-CM | POA: Diagnosis not present

## 2020-06-20 DIAGNOSIS — N186 End stage renal disease: Secondary | ICD-10-CM | POA: Diagnosis not present

## 2020-06-20 DIAGNOSIS — N2581 Secondary hyperparathyroidism of renal origin: Secondary | ICD-10-CM | POA: Diagnosis not present

## 2020-06-20 DIAGNOSIS — Z992 Dependence on renal dialysis: Secondary | ICD-10-CM | POA: Diagnosis not present

## 2020-06-22 DIAGNOSIS — N2581 Secondary hyperparathyroidism of renal origin: Secondary | ICD-10-CM | POA: Diagnosis not present

## 2020-06-22 DIAGNOSIS — N186 End stage renal disease: Secondary | ICD-10-CM | POA: Diagnosis not present

## 2020-06-22 DIAGNOSIS — Z992 Dependence on renal dialysis: Secondary | ICD-10-CM | POA: Diagnosis not present

## 2020-06-24 DIAGNOSIS — N2581 Secondary hyperparathyroidism of renal origin: Secondary | ICD-10-CM | POA: Diagnosis not present

## 2020-06-24 DIAGNOSIS — Z992 Dependence on renal dialysis: Secondary | ICD-10-CM | POA: Diagnosis not present

## 2020-06-24 DIAGNOSIS — N186 End stage renal disease: Secondary | ICD-10-CM | POA: Diagnosis not present

## 2020-06-27 DIAGNOSIS — I129 Hypertensive chronic kidney disease with stage 1 through stage 4 chronic kidney disease, or unspecified chronic kidney disease: Secondary | ICD-10-CM | POA: Diagnosis not present

## 2020-06-27 DIAGNOSIS — N186 End stage renal disease: Secondary | ICD-10-CM | POA: Diagnosis not present

## 2020-06-27 DIAGNOSIS — Z992 Dependence on renal dialysis: Secondary | ICD-10-CM | POA: Diagnosis not present

## 2020-06-27 DIAGNOSIS — N2581 Secondary hyperparathyroidism of renal origin: Secondary | ICD-10-CM | POA: Diagnosis not present

## 2020-06-29 DIAGNOSIS — Z992 Dependence on renal dialysis: Secondary | ICD-10-CM | POA: Diagnosis not present

## 2020-06-29 DIAGNOSIS — N186 End stage renal disease: Secondary | ICD-10-CM | POA: Diagnosis not present

## 2020-06-29 DIAGNOSIS — N2581 Secondary hyperparathyroidism of renal origin: Secondary | ICD-10-CM | POA: Diagnosis not present

## 2020-07-04 DIAGNOSIS — N2581 Secondary hyperparathyroidism of renal origin: Secondary | ICD-10-CM | POA: Diagnosis not present

## 2020-07-04 DIAGNOSIS — N186 End stage renal disease: Secondary | ICD-10-CM | POA: Diagnosis not present

## 2020-07-04 DIAGNOSIS — Z992 Dependence on renal dialysis: Secondary | ICD-10-CM | POA: Diagnosis not present

## 2020-07-06 DIAGNOSIS — Z992 Dependence on renal dialysis: Secondary | ICD-10-CM | POA: Diagnosis not present

## 2020-07-06 DIAGNOSIS — N186 End stage renal disease: Secondary | ICD-10-CM | POA: Diagnosis not present

## 2020-07-06 DIAGNOSIS — N2581 Secondary hyperparathyroidism of renal origin: Secondary | ICD-10-CM | POA: Diagnosis not present

## 2020-07-08 DIAGNOSIS — N186 End stage renal disease: Secondary | ICD-10-CM | POA: Diagnosis not present

## 2020-07-08 DIAGNOSIS — N2581 Secondary hyperparathyroidism of renal origin: Secondary | ICD-10-CM | POA: Diagnosis not present

## 2020-07-08 DIAGNOSIS — Z992 Dependence on renal dialysis: Secondary | ICD-10-CM | POA: Diagnosis not present

## 2020-07-11 DIAGNOSIS — N186 End stage renal disease: Secondary | ICD-10-CM | POA: Diagnosis not present

## 2020-07-11 DIAGNOSIS — N2581 Secondary hyperparathyroidism of renal origin: Secondary | ICD-10-CM | POA: Diagnosis not present

## 2020-07-11 DIAGNOSIS — Z992 Dependence on renal dialysis: Secondary | ICD-10-CM | POA: Diagnosis not present

## 2020-07-12 DIAGNOSIS — E1351 Other specified diabetes mellitus with diabetic peripheral angiopathy without gangrene: Secondary | ICD-10-CM | POA: Diagnosis not present

## 2020-07-12 DIAGNOSIS — L84 Corns and callosities: Secondary | ICD-10-CM | POA: Diagnosis not present

## 2020-07-12 DIAGNOSIS — L602 Onychogryphosis: Secondary | ICD-10-CM | POA: Diagnosis not present

## 2020-07-13 DIAGNOSIS — N186 End stage renal disease: Secondary | ICD-10-CM | POA: Diagnosis not present

## 2020-07-13 DIAGNOSIS — Z992 Dependence on renal dialysis: Secondary | ICD-10-CM | POA: Diagnosis not present

## 2020-07-13 DIAGNOSIS — N2581 Secondary hyperparathyroidism of renal origin: Secondary | ICD-10-CM | POA: Diagnosis not present

## 2020-07-15 DIAGNOSIS — N186 End stage renal disease: Secondary | ICD-10-CM | POA: Diagnosis not present

## 2020-07-15 DIAGNOSIS — Z992 Dependence on renal dialysis: Secondary | ICD-10-CM | POA: Diagnosis not present

## 2020-07-15 DIAGNOSIS — N2581 Secondary hyperparathyroidism of renal origin: Secondary | ICD-10-CM | POA: Diagnosis not present

## 2020-07-18 DIAGNOSIS — N186 End stage renal disease: Secondary | ICD-10-CM | POA: Diagnosis not present

## 2020-07-18 DIAGNOSIS — Z992 Dependence on renal dialysis: Secondary | ICD-10-CM | POA: Diagnosis not present

## 2020-07-18 DIAGNOSIS — N2581 Secondary hyperparathyroidism of renal origin: Secondary | ICD-10-CM | POA: Diagnosis not present

## 2020-07-20 DIAGNOSIS — N2581 Secondary hyperparathyroidism of renal origin: Secondary | ICD-10-CM | POA: Diagnosis not present

## 2020-07-20 DIAGNOSIS — N186 End stage renal disease: Secondary | ICD-10-CM | POA: Diagnosis not present

## 2020-07-20 DIAGNOSIS — Z992 Dependence on renal dialysis: Secondary | ICD-10-CM | POA: Diagnosis not present

## 2020-07-22 ENCOUNTER — Other Ambulatory Visit: Payer: Self-pay | Admitting: Nurse Practitioner

## 2020-07-22 DIAGNOSIS — Z992 Dependence on renal dialysis: Secondary | ICD-10-CM | POA: Diagnosis not present

## 2020-07-22 DIAGNOSIS — N2581 Secondary hyperparathyroidism of renal origin: Secondary | ICD-10-CM | POA: Diagnosis not present

## 2020-07-22 DIAGNOSIS — N186 End stage renal disease: Secondary | ICD-10-CM | POA: Diagnosis not present

## 2020-07-22 DIAGNOSIS — R7303 Prediabetes: Secondary | ICD-10-CM

## 2020-07-25 DIAGNOSIS — N186 End stage renal disease: Secondary | ICD-10-CM | POA: Diagnosis not present

## 2020-07-25 DIAGNOSIS — N2581 Secondary hyperparathyroidism of renal origin: Secondary | ICD-10-CM | POA: Diagnosis not present

## 2020-07-25 DIAGNOSIS — Z992 Dependence on renal dialysis: Secondary | ICD-10-CM | POA: Diagnosis not present

## 2020-07-27 DIAGNOSIS — Z992 Dependence on renal dialysis: Secondary | ICD-10-CM | POA: Diagnosis not present

## 2020-07-27 DIAGNOSIS — N2581 Secondary hyperparathyroidism of renal origin: Secondary | ICD-10-CM | POA: Diagnosis not present

## 2020-07-27 DIAGNOSIS — I129 Hypertensive chronic kidney disease with stage 1 through stage 4 chronic kidney disease, or unspecified chronic kidney disease: Secondary | ICD-10-CM | POA: Diagnosis not present

## 2020-07-27 DIAGNOSIS — N186 End stage renal disease: Secondary | ICD-10-CM | POA: Diagnosis not present

## 2020-07-29 DIAGNOSIS — N2581 Secondary hyperparathyroidism of renal origin: Secondary | ICD-10-CM | POA: Diagnosis not present

## 2020-07-29 DIAGNOSIS — Z992 Dependence on renal dialysis: Secondary | ICD-10-CM | POA: Diagnosis not present

## 2020-07-29 DIAGNOSIS — N186 End stage renal disease: Secondary | ICD-10-CM | POA: Diagnosis not present

## 2020-08-01 DIAGNOSIS — Z992 Dependence on renal dialysis: Secondary | ICD-10-CM | POA: Diagnosis not present

## 2020-08-01 DIAGNOSIS — N186 End stage renal disease: Secondary | ICD-10-CM | POA: Diagnosis not present

## 2020-08-01 DIAGNOSIS — N2581 Secondary hyperparathyroidism of renal origin: Secondary | ICD-10-CM | POA: Diagnosis not present

## 2020-08-03 DIAGNOSIS — N186 End stage renal disease: Secondary | ICD-10-CM | POA: Diagnosis not present

## 2020-08-03 DIAGNOSIS — N2581 Secondary hyperparathyroidism of renal origin: Secondary | ICD-10-CM | POA: Diagnosis not present

## 2020-08-03 DIAGNOSIS — Z992 Dependence on renal dialysis: Secondary | ICD-10-CM | POA: Diagnosis not present

## 2020-08-04 ENCOUNTER — Other Ambulatory Visit: Payer: Self-pay | Admitting: Nurse Practitioner

## 2020-08-04 DIAGNOSIS — I10 Essential (primary) hypertension: Secondary | ICD-10-CM

## 2020-08-05 DIAGNOSIS — N186 End stage renal disease: Secondary | ICD-10-CM | POA: Diagnosis not present

## 2020-08-05 DIAGNOSIS — Z992 Dependence on renal dialysis: Secondary | ICD-10-CM | POA: Diagnosis not present

## 2020-08-05 DIAGNOSIS — N2581 Secondary hyperparathyroidism of renal origin: Secondary | ICD-10-CM | POA: Diagnosis not present

## 2020-08-08 ENCOUNTER — Other Ambulatory Visit: Payer: Self-pay | Admitting: Nurse Practitioner

## 2020-08-08 DIAGNOSIS — Z992 Dependence on renal dialysis: Secondary | ICD-10-CM | POA: Diagnosis not present

## 2020-08-08 DIAGNOSIS — N2581 Secondary hyperparathyroidism of renal origin: Secondary | ICD-10-CM | POA: Diagnosis not present

## 2020-08-08 DIAGNOSIS — I10 Essential (primary) hypertension: Secondary | ICD-10-CM

## 2020-08-08 DIAGNOSIS — N186 End stage renal disease: Secondary | ICD-10-CM | POA: Diagnosis not present

## 2020-08-10 DIAGNOSIS — N186 End stage renal disease: Secondary | ICD-10-CM | POA: Diagnosis not present

## 2020-08-10 DIAGNOSIS — N2581 Secondary hyperparathyroidism of renal origin: Secondary | ICD-10-CM | POA: Diagnosis not present

## 2020-08-10 DIAGNOSIS — Z992 Dependence on renal dialysis: Secondary | ICD-10-CM | POA: Diagnosis not present

## 2020-08-12 DIAGNOSIS — N2581 Secondary hyperparathyroidism of renal origin: Secondary | ICD-10-CM | POA: Diagnosis not present

## 2020-08-12 DIAGNOSIS — Z992 Dependence on renal dialysis: Secondary | ICD-10-CM | POA: Diagnosis not present

## 2020-08-12 DIAGNOSIS — N186 End stage renal disease: Secondary | ICD-10-CM | POA: Diagnosis not present

## 2020-08-14 ENCOUNTER — Ambulatory Visit (INDEPENDENT_AMBULATORY_CARE_PROVIDER_SITE_OTHER): Payer: Medicare PPO | Admitting: Nurse Practitioner

## 2020-08-14 ENCOUNTER — Encounter: Payer: Self-pay | Admitting: Nurse Practitioner

## 2020-08-14 ENCOUNTER — Other Ambulatory Visit: Payer: Self-pay

## 2020-08-14 VITALS — BP 112/68 | HR 70 | Temp 98.0°F | Ht 70.0 in | Wt 205.2 lb

## 2020-08-14 DIAGNOSIS — Z6829 Body mass index (BMI) 29.0-29.9, adult: Secondary | ICD-10-CM

## 2020-08-14 DIAGNOSIS — I10 Essential (primary) hypertension: Secondary | ICD-10-CM

## 2020-08-14 DIAGNOSIS — Z992 Dependence on renal dialysis: Secondary | ICD-10-CM | POA: Diagnosis not present

## 2020-08-14 DIAGNOSIS — I69351 Hemiplegia and hemiparesis following cerebral infarction affecting right dominant side: Secondary | ICD-10-CM

## 2020-08-14 DIAGNOSIS — E663 Overweight: Secondary | ICD-10-CM

## 2020-08-14 DIAGNOSIS — I120 Hypertensive chronic kidney disease with stage 5 chronic kidney disease or end stage renal disease: Secondary | ICD-10-CM

## 2020-08-14 DIAGNOSIS — E039 Hypothyroidism, unspecified: Secondary | ICD-10-CM | POA: Diagnosis not present

## 2020-08-14 DIAGNOSIS — N186 End stage renal disease: Secondary | ICD-10-CM

## 2020-08-14 DIAGNOSIS — I700 Atherosclerosis of aorta: Secondary | ICD-10-CM | POA: Diagnosis not present

## 2020-08-14 NOTE — Patient Instructions (Signed)

## 2020-08-14 NOTE — Progress Notes (Addendum)
I,Javier Jenkins,acting as a Education administrator for [employer], FNP.,have documented all relevant documentation on the behalf of Javier Brine, FNP,as directed by  Javier Brine, FNP while in the presence of Javier Jenkins, [place].  This visit occurred during the SARS-CoV-2 public health emergency.  Safety protocols were in place, including screening questions prior to the visit, additional usage of staff PPE, and extensive cleaning of exam room while observing appropriate contact time as indicated for disinfecting solutions.  Subjective:     Patient ID: Javier Jenkins , male    DOB: 1951-01-19 , 70 y.o.   MRN: [id number]   Chief Complaint  Patient presents with   Hypertension    HPI  Pt is here today for follow up for blood pressure, he has no other concerns today he has been compliant with all meds. Continues to have hemodialysis 3 days a week. Lives with his wife.  BP Readings from Last 3 Encounters: 08/14/20 : 112/68 04/12/20 : 122/80 01/12/20 : 122/82       Hypertension This is a chronic problem. The current episode started more than 1 year ago. The problem is unchanged. The problem is controlled. Pertinent negatives include no anxiety, chest pain, headaches or palpitations. There are no associated agents to hypertension. Risk factors for coronary artery disease include sedentary lifestyle and male gender. Past treatments include beta blockers. The current treatment provides no improvement. Compliance problems: unable to exercise due to history of stroke.  There is no history of angina. There is no history of chronic renal disease.    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   Stroke ([place]) 1998   right side paralysis     Family History  Problem Relation Age of Onset   Hypertension Mother    Cancer Paternal Uncle        prostate ca     Current Outpatient Medications:    aspirin 325 MG tablet, Take 1 tablet (325 mg total) by mouth daily., Disp: 30 tablet, Rfl: 0   atorvastatin (LIPITOR) 10 MG tablet, TAKE 1 TABLET BY MOUTH EVERY DAY, Disp: 90 tablet, Rfl: 1   cinacalcet (SENSIPAR) 30 MG tablet, Take 30 mg by mouth daily., Disp: , Rfl:    ferric citrate (AURYXIA) 1 GM 210 MG(Fe) tablet, Take 420 mg by mouth 3 (three) times daily with meals., Disp: , Rfl:    levETIRAcetam (KEPPRA) 500 MG tablet, TAKE 1 TABLET BY MOUTH TWICE A DAY, Disp: 180 tablet, Rfl: 1   levothyroxine (SYNTHROID) 25 MCG tablet, TAKE 1 TABLET BY MOUTH EVERY DAY, Disp: 90 tablet, Rfl: 1   metoprolol tartrate (LOPRESSOR) 100 MG tablet, TAKE 1 TABLET BY MOUTH TWICE A DAY, Disp: 180 tablet, Rfl: 0   multivitamin (RENA-VIT) TABS tablet, Take 1 tablet by mouth at bedtime., Disp: 90 tablet, Rfl: 0   Allergies  Allergen Reactions   Tape Dermatitis    Paper tape     Review of Systems  Constitutional: Negative.   Respiratory: Negative.    Cardiovascular: Negative.  Negative for chest pain, palpitations and leg swelling.  Gastrointestinal: Negative.  Neurological:  Positive for weakness (right upper and lower extremity). Negative for dizziness, seizures and headaches.  Psychiatric/Behavioral: Negative.      Today's Vitals   08/14/20 1001  BP: 112/68  Pulse: 70  Temp: 98 F (36.7 C)  TempSrc: Oral  Weight: 205 lb 3.2 oz (93.1 kg)  Height: '5\' 10"'  (1.778 m)   Body mass index is 29.44 kg/m.  Wt Readings from Last 3 Encounters:  08/14/20 205 lb 3.2 oz (93.1 kg)  04/12/20 207 lb (93.9 kg)  01/12/20 206 lb 5.6 oz (93.6 kg)    Objective:  Physical Exam Vitals reviewed.  Constitutional:      General: He is not in acute  distress.    Appearance: Normal appearance. He is obese.  HENT:     Head: Normocephalic.  Eyes:     Extraocular Movements: Extraocular movements intact.     Pupils: Pupils are equal, round, and reactive to light.  Cardiovascular:     Rate and Rhythm: Normal rate and regular rhythm.     Pulses: Normal pulses.     Heart sounds: Normal heart sounds. No murmur heard. Pulmonary:     Effort: Pulmonary effort is normal. No respiratory distress.     Breath sounds: Normal breath sounds. No wheezing.  Musculoskeletal:     Comments: Leg brace on right lower extremity from previous stroke. Has flaccid right upper extremity  Skin:    General: Skin is warm and dry.     Capillary Refill: Capillary refill takes less than 2 seconds.  Neurological:     General: No focal deficit present.     Mental Status: He is alert and oriented to person, place, and time.     Cranial Nerves: No cranial nerve deficit.  Psychiatric:        Mood and Affect: Mood normal.        Behavior: Behavior normal.        Thought Content: Thought content normal.        Judgment: Judgment normal.        Assessment And Plan:     1. Essential hypertension Chronic, well controlled Continue with current medications - CMP14+EGFR  2. Atherosclerosis of aorta (HCC) Chronic, tolerating statin well  3. Overweight with body mass index (BMI) of 29 to 29.9 in adult He is encouraged to initially strive for BMI less than 28 to decrease cardiac risk. He is advised to exercise no less than 150 minutes per week.   4. Hemiparesis affecting right side as late effect of stroke ([place])  5. End stage renal failure on dialysis [facility]) Continue with hemodialysis  6. Acquired hypothyroidism Chronic, well controlled Continue current medications - TSH - T4 - T3, free      Patient was given opportunity to ask questions. Patient verbalized understanding of the plan and was able to repeat key elements of the plan. All questions were  answered to their satisfaction.  Javier Brine, FNP   I, Javier Brine, FNP, have reviewed all documentation for this visit. The documentation on 08/14/20 for the exam, diagnosis, procedures, and orders are all accurate and complete.   IF YOU HAVE BEEN REFERRED TO A SPECIALIST, IT MAY TAKE 1-2 WEEKS TO SCHEDULE/PROCESS THE REFERRAL. IF YOU HAVE NOT HEARD FROM US/SPECIALIST IN TWO WEEKS, PLEASE GIVE [place] A CALL AT [phone].   THE PATIENT IS ENCOURAGED TO PRACTICE SOCIAL DISTANCING DUE TO THE COVID-19 PANDEMIC.

## 2020-08-15 DIAGNOSIS — Z992 Dependence on renal dialysis: Secondary | ICD-10-CM | POA: Diagnosis not present

## 2020-08-15 DIAGNOSIS — N186 End stage renal disease: Secondary | ICD-10-CM | POA: Diagnosis not present

## 2020-08-15 DIAGNOSIS — N2581 Secondary hyperparathyroidism of renal origin: Secondary | ICD-10-CM | POA: Diagnosis not present

## 2020-08-15 LAB — CMP14+EGFR
ALT: 13 IU/L (ref 0–44)
AST: 13 IU/L (ref 0–40)
Albumin/Globulin Ratio: 1.5 (ref 1.2–2.2)
Albumin: 4.5 g/dL (ref 3.7–4.7)
Alkaline Phosphatase: 99 IU/L (ref 44–121)
BUN/Creatinine Ratio: 4 — ABNORMAL LOW (ref 10–24)
BUN: 35 mg/dL — ABNORMAL HIGH (ref 8–27)
Bilirubin Total: 0.3 mg/dL (ref 0.0–1.2)
CO2: 25 mmol/L (ref 20–29)
Calcium: 8.5 mg/dL — ABNORMAL LOW (ref 8.6–10.2)
Chloride: 94 mmol/L — ABNORMAL LOW (ref 96–106)
Creatinine, Ser: 9.63 mg/dL — ABNORMAL HIGH (ref 0.76–1.27)
Globulin, Total: 3 g/dL (ref 1.5–4.5)
Glucose: 114 mg/dL — ABNORMAL HIGH (ref 65–99)
Potassium: 5.6 mmol/L — ABNORMAL HIGH (ref 3.5–5.2)
Sodium: 137 mmol/L (ref 134–144)
Total Protein: 7.5 g/dL (ref 6.0–8.5)
eGFR: 5 mL/min/{1.73_m2} — ABNORMAL LOW (ref >59)

## 2020-08-15 LAB — TSH: TSH: 2.96 u[IU]/mL (ref 0.450–4.500)

## 2020-08-15 LAB — T4: T4, Total: 6.3 ug/dL (ref 4.5–12.0)

## 2020-08-15 LAB — T3, FREE: T3, Free: 2.3 pg/mL (ref 2.0–4.4)

## 2020-08-17 DIAGNOSIS — N2581 Secondary hyperparathyroidism of renal origin: Secondary | ICD-10-CM | POA: Diagnosis not present

## 2020-08-17 DIAGNOSIS — Z992 Dependence on renal dialysis: Secondary | ICD-10-CM | POA: Diagnosis not present

## 2020-08-17 DIAGNOSIS — N186 End stage renal disease: Secondary | ICD-10-CM | POA: Diagnosis not present

## 2020-08-19 ENCOUNTER — Other Ambulatory Visit: Payer: Self-pay

## 2020-08-19 ENCOUNTER — Emergency Department (HOSPITAL_COMMUNITY)
Admission: EM | Admit: 2020-08-19 | Discharge: 2020-08-19 | Disposition: A | Discharge status: Home / Self Care | Payer: Medicare PPO | Attending: Emergency Medicine | Admitting: Emergency Medicine

## 2020-08-19 ENCOUNTER — Encounter (HOSPITAL_COMMUNITY): Payer: Self-pay | Admitting: Emergency Medicine

## 2020-08-19 DIAGNOSIS — E039 Hypothyroidism, unspecified: Secondary | ICD-10-CM | POA: Diagnosis not present

## 2020-08-19 DIAGNOSIS — Z79899 Other long term (current) drug therapy: Secondary | ICD-10-CM | POA: Diagnosis not present

## 2020-08-19 DIAGNOSIS — D631 Anemia in chronic kidney disease: Secondary | ICD-10-CM | POA: Diagnosis not present

## 2020-08-19 DIAGNOSIS — E1122 Type 2 diabetes mellitus with diabetic chronic kidney disease: Secondary | ICD-10-CM | POA: Insufficient documentation

## 2020-08-19 DIAGNOSIS — Z87891 Personal history of nicotine dependence: Secondary | ICD-10-CM | POA: Diagnosis not present

## 2020-08-19 DIAGNOSIS — N2581 Secondary hyperparathyroidism of renal origin: Secondary | ICD-10-CM | POA: Diagnosis not present

## 2020-08-19 DIAGNOSIS — U071 COVID-19: Secondary | ICD-10-CM | POA: Insufficient documentation

## 2020-08-19 DIAGNOSIS — Z7982 Long term (current) use of aspirin: Secondary | ICD-10-CM | POA: Diagnosis not present

## 2020-08-19 DIAGNOSIS — N186 End stage renal disease: Secondary | ICD-10-CM | POA: Diagnosis not present

## 2020-08-19 DIAGNOSIS — I120 Hypertensive chronic kidney disease with stage 5 chronic kidney disease or end stage renal disease: Secondary | ICD-10-CM | POA: Diagnosis not present

## 2020-08-19 DIAGNOSIS — Z992 Dependence on renal dialysis: Secondary | ICD-10-CM | POA: Insufficient documentation

## 2020-08-19 DIAGNOSIS — J069 Acute upper respiratory infection, unspecified: Secondary | ICD-10-CM | POA: Insufficient documentation

## 2020-08-19 DIAGNOSIS — R0981 Nasal congestion: Secondary | ICD-10-CM | POA: Diagnosis present

## 2020-08-19 NOTE — Discharge Instructions (Addendum)
Home to quarantine.  Your COVID test results will be available in your MyChart in the next 24 hours. If your COVID test is positive, call your doctor's office to see if you are a candidate for antiviral therapy.

## 2020-08-19 NOTE — ED Triage Notes (Signed)
Daughter who lives with patient has Covid, started having runny nose, cough and Covid symptoms yesterday, requesting Covid testing.

## 2020-08-19 NOTE — ED Provider Notes (Signed)
[facility] DEPT Provider Note   CSN: [id number] Arrival date & time: 08/19/20  1750     History No chief complaint on file.   Javier Jenkins is a 70 y.o. male.  70 year old male presents with complaint of runny nose, mild cough onset yesterday, requesting COVID test.  Patient was exposed to his daughter who has tested positive for COVID.  Patient has a history of dialysis, attends Tuesday, Thursday, Saturday, had full dialysis today without complications.  Denies feeling short of breath.  Patient is vaccinated against COVID.  No other complaints or concerns.  Javier Jenkins was evaluated in Emergency Department on 08/19/2020 for the symptoms described in the history of present illness. He was evaluated in the context of the global COVID-19 pandemic, which necessitated consideration that the patient might be at risk for infection with the SARS-CoV-2 virus that causes COVID-19. Institutional protocols and algorithms that pertain to the evaluation of patients at risk for COVID-19 are in a state of rapid change based on information released by regulatory bodies including the CDC and federal and state organizations. These policies and algorithms were followed during the patient's care in the ED.       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[place]) 06/07/2015   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   Stroke ([place]) 1998   right side paralysis    Patient Active Problem List   Diagnosis Date Noted   AVF (arteriovenous fistula) ([place]) 04/05/2019   Prediabetes 04/05/2019   Nodule of lower lobe of right lung 03/31/2017   Kidney cysts 03/31/2017    Port catheter in place 08/25/2015   Mucositis due to chemotherapy 07/14/2015   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   Constipation due to opioid therapy 06/07/2015   Chronic nausea 06/07/2015   Hemiparesis affecting right side as late effect of stroke ([place]) 06/07/2015   Adult polycystic kidney disease 06/07/2015   ESRD (end stage renal disease) ([place]) 05/31/2015   Seizure prophylaxis 05/31/2015   Hypothyroidism 05/31/2015   End stage renal failure on dialysis ([place]) 04/13/2015   Anemia in chronic kidney disease 04/13/2015   Essential hypertension 04/13/2015    Past Surgical History:  Procedure Laterality Date   AV FISTULA PLACEMENT Left 10/18/2014   Procedure: ARTERIOVENOUS (AV) FISTULA CREATION;  Surgeon: Angelia Mould, MD;  Location: [facility];  Service: Vascular;  Laterality: Left;   BURR HOLE Right 03/17/2015   Procedure: Trudee Kuster HOLES FOR RIGHT SUBDURAL HEMATOMA;  Surgeon: Leeroy Cha, MD;  Location: [facility];  Service: Neurosurgery;  Laterality: Right;   COLONOSCOPY     HEMORRHOID SURGERY     IR REMOVAL TUN ACCESS W/ PORT W/O FL MOD SED  05/12/2017   REVISON OF ARTERIOVENOUS FISTULA Left 09/04/2018   Procedure: EXCISION OF ULCER ON LEFT ARM ARTERIOVENOUS FISTULA;  Surgeon: Angelia Mould, MD;  Location: [facility];  Service: Vascular;  Laterality: Left;       Family History  Problem Relation Age of Onset   Hypertension Mother    Cancer Paternal Uncle        prostate  ca    Social History   Tobacco Use   Smoking status: Former    Types: Pipe    Quit date: 09/22/1984    Years since quitting: 35.9   Smokeless tobacco: Never  Vaping Use   Vaping Use: Never used  Substance Use Topics   Alcohol use: No    Alcohol/week: 0.0 standard drinks   Drug use: No    Home Medications Prior to Admission medications   Medication Sig Start Date End Date Taking? Authorizing Provider  aspirin 325 MG tablet Take 1 tablet (325 mg  total) by mouth daily. 04/21/15   Reyne Dumas, MD  atorvastatin (LIPITOR) 10 MG tablet TAKE 1 TABLET BY MOUTH EVERY DAY 07/24/20   [name], FNP  cinacalcet (SENSIPAR) 30 MG tablet Take 30 mg by mouth daily.    [provider]  ferric citrate (AURYXIA) 1 GM 210 MG(Fe) tablet Take 420 mg by mouth 3 (three) times daily with meals.    [provider]  levETIRAcetam (KEPPRA) 500 MG tablet TAKE 1 TABLET BY MOUTH TWICE A DAY 06/16/20   [name], FNP  levothyroxine (SYNTHROID) 25 MCG tablet TAKE 1 TABLET BY MOUTH EVERY DAY 06/15/20   [name], FNP  metoprolol tartrate (LOPRESSOR) 100 MG tablet TAKE 1 TABLET BY MOUTH TWICE A DAY 08/08/20   [name], FNP  multivitamin (RENA-VIT) TABS tablet Take 1 tablet by mouth at bedtime. 06/03/15   Tomma Rakers, MD    Allergies    Tape  Review of Systems   Review of Systems  Constitutional:  Negative for chills and fever.  HENT:  Positive for rhinorrhea. Negative for congestion.   Respiratory:  Positive for cough.   Gastrointestinal:  Negative for constipation, diarrhea, nausea and vomiting.  Musculoskeletal:  Negative for arthralgias and myalgias.  Skin:  Negative for rash and wound.  Allergic/Immunologic: Positive for immunocompromised state.  Neurological:  Negative for weakness.  Psychiatric/Behavioral:  Negative for confusion.   All other systems reviewed and are negative.  Physical Exam Updated Vital Signs BP 100/85   Pulse 83   Temp 99.2 F (37.3 C) (Oral)   Resp 18   SpO2 95%   Physical Exam Vitals and nursing note reviewed.  Constitutional:      General: He is not in acute distress.    Appearance: He is well-developed. He is not diaphoretic.  HENT:     Head: Normocephalic and atraumatic.  Eyes:     Conjunctiva/sclera: Conjunctivae normal.  Cardiovascular:     Rate and Rhythm: Normal rate and regular rhythm.     Heart sounds: Normal heart sounds.  Pulmonary:     Effort: Pulmonary effort  is normal.     Breath sounds: Normal breath sounds.  Abdominal:     Palpations: Abdomen is soft.     Tenderness: There is no abdominal tenderness.  Skin:    General: Skin is warm and dry.     Findings: No erythema or rash.  Neurological:     Mental Status: He is alert and oriented to person, place, and time.  Psychiatric:        Behavior: Behavior normal.    ED Results / Procedures / Treatments   Labs (all labs ordered are listed, but only abnormal results are displayed) Labs Reviewed  SARS CORONAVIRUS 2 (TAT 6-24 HRS)    EKG None  Radiology No results found.  Procedures Procedures   Medications Ordered in ED Medications - No data to display  ED  Course  I have reviewed the triage vital signs and the nursing notes.  Pertinent labs & imaging results that were available during my care of the patient were reviewed by me and considered in my medical decision making (see chart for details).  Clinical Course as of 08/19/20 1944  Sat Jul 23, 11104  [id number] 70 year old male with COVID exposure with complaint of cough and runny nose requesting COVID test. Patient is well-appearing, vitals reassuring including O2 sat of 95% on room air. Plan is to send COVID test, advised patient to monitor his MyChart account for his results and call PCP to discuss if he is a candidate for antiviral therapy if his test is positive. [LM]    Clinical Course User Index [LM] Roque Lias   MDM Rules/Calculators/A&P                           Final Clinical Impression(s) / ED Diagnoses Final diagnoses:  Viral URI    Rx / DC Orders ED Discharge Orders     None        Roque Lias 08/19/20 1944    Dorie Rank, MD 08/21/20 [phone]

## 2020-08-20 LAB — SARS CORONAVIRUS 2 (TAT 6-24 HRS): SARS Coronavirus 2: POSITIVE — AB

## 2020-08-21 ENCOUNTER — Encounter: Payer: Self-pay | Admitting: Nurse Practitioner

## 2020-08-21 ENCOUNTER — Telehealth: Payer: Self-pay

## 2020-08-21 ENCOUNTER — Telehealth (INDEPENDENT_AMBULATORY_CARE_PROVIDER_SITE_OTHER): Payer: Medicare PPO | Admitting: Nurse Practitioner

## 2020-08-21 DIAGNOSIS — N186 End stage renal disease: Secondary | ICD-10-CM | POA: Diagnosis not present

## 2020-08-21 DIAGNOSIS — U071 COVID-19: Secondary | ICD-10-CM

## 2020-08-21 NOTE — Progress Notes (Signed)
Virtual Visit via MyChart   This visit type was conducted due to national recommendations for restrictions regarding the COVID-19 Pandemic (e.g. social distancing) in an effort to limit this patient's exposure and mitigate transmission in our community.  Due to his co-morbid illnesses, this patient is at least at moderate risk for complications without adequate follow up.  This format is felt to be most appropriate for this patient at this time.  All issues noted in this document were discussed and addressed.  A limited physical exam was performed with this format.    This visit type was conducted due to national recommendations for restrictions regarding the COVID-19 Pandemic (e.g. social distancing) in an effort to limit this patient's exposure and mitigate transmission in our community.  Patients identity confirmed using two different identifiers.  This format is felt to be most appropriate for this patient at this time.  All issues noted in this document were discussed and addressed.  No physical exam was performed (except for noted visual exam findings with Video Visits).    Date:  09/22/2020   ID:  Javier Jenkins, DOB 05-Oct-1950, MRN [id number]  Patient Location:  Home - spoke with Javier Jenkins  Provider location:   Office    Chief Complaint:  sinus problems  History of Present Illness:    Javier Jenkins is a 70 y.o. male who presents via video conferencing for a telehealth visit today.    The patient does have symptoms concerning for COVID-19 infection (fever, chills, cough, or new shortness of breath).   He went to the ER on Saturday for nasal congestion, runny nose and cough. His food also did not taste the same on Saturday. Was diagnosed with Covid vaccine and one booster. He states "I feel okay, I feel good just have a little cough."  He is not coughing up anything. Denies fever. He has also gone to dialysis on Saturday morning, he goes tomorrow and has mad them  aware.     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[place]) 06/07/2015   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   Stroke ([place]) 1998   right side paralysis   Past Surgical History:  Procedure Laterality Date   AV FISTULA PLACEMENT Left 10/18/2014   Procedure: ARTERIOVENOUS (AV) FISTULA CREATION;  Surgeon: Angelia Mould, MD;  Location: [facility];  Service: Vascular;  Laterality: Left;   BURR HOLE Right 03/17/2015   Procedure: Trudee Kuster HOLES FOR RIGHT SUBDURAL HEMATOMA;  Surgeon: Leeroy Cha, MD;  Location: [facility];  Service: Neurosurgery;  Laterality: Right;   COLONOSCOPY     HEMORRHOID SURGERY     IR REMOVAL TUN ACCESS W/ PORT W/O FL MOD SED  05/12/2017   REVISON OF ARTERIOVENOUS FISTULA Left 09/04/2018   Procedure: EXCISION OF ULCER ON LEFT ARM ARTERIOVENOUS FISTULA;  Surgeon: Angelia Mould, MD;  [facility] OR;  Service: Vascular;  Laterality: Left;     Current Meds  Medication Sig   aspirin 325 MG tablet Take 1 tablet (325 mg total) by mouth daily.   atorvastatin (LIPITOR) 10 MG tablet TAKE 1 TABLET BY MOUTH EVERY DAY  cinacalcet (SENSIPAR) 30 MG tablet Take 30 mg by mouth daily.   ferric citrate (AURYXIA) 1 GM 210 MG(Fe) tablet Take 420 mg by mouth 3 (three) times daily with meals.   levETIRAcetam (KEPPRA) 500 MG tablet TAKE 1 TABLET BY MOUTH TWICE A DAY   levothyroxine (SYNTHROID) 25 MCG tablet TAKE 1 TABLET BY MOUTH EVERY DAY   metoprolol tartrate (LOPRESSOR) 100 MG tablet TAKE 1 TABLET BY MOUTH TWICE A DAY   multivitamin (RENA-VIT) TABS tablet Take 1 tablet by mouth at bedtime.     Allergies:   Tape   Social History   Tobacco Use   Smoking status:  Former    Types: Pipe    Quit date: 09/22/1984    Years since quitting: 36.0   Smokeless tobacco: Never  Vaping Use   Vaping Use: Never used  Substance Use Topics   Alcohol use: No    Alcohol/week: 0.0 standard drinks   Drug use: No     Family Hx: The patient's family history includes Cancer in his paternal uncle; Hypertension in his mother.  ROS:   Please see the history of present illness.    Review of Systems  Constitutional:  Negative for chills and fever.  HENT:  Positive for congestion and sore throat. Negative for ear pain.   Respiratory:  Positive for cough. Negative for sputum production, shortness of breath and wheezing.   Cardiovascular: Negative.   Neurological: Negative.   Psychiatric/Behavioral: Negative.     All other systems reviewed and are negative.   Labs/Other Tests and Data Reviewed:    Recent Labs: 08/14/2020: ALT 13; BUN 35; Creatinine, Ser 9.63; Potassium 5.6; Sodium 137; TSH 2.960 09/04/2020: Hemoglobin 11.2; Platelet Count 234   Recent Lipid Panel No results found for: CHOL, TRIG, HDL, CHOLHDL, LDLCALC, LDLDIRECT  Wt Readings from Last 3 Encounters:  09/04/20 208 lb 12.8 oz (94.7 kg)  08/14/20 205 lb 3.2 oz (93.1 kg)  04/12/20 207 lb (93.9 kg)     Exam:    Vital Signs:  There were no vitals taken for this visit.    Physical Exam Vitals reviewed.  Constitutional:      General: He is not in acute distress.    Appearance: Normal appearance.  Pulmonary:     Effort: Pulmonary effort is normal.  Neurological:     General: No focal deficit present.     Mental Status: He is alert and oriented to person, place, and time.     Cranial Nerves: No cranial nerve deficit.     Motor: No weakness.    ASSESSMENT & PLAN:    1. COVID-19 Advised patient to take Vitamin C, D, Zinc.  Keep yourself hydrated with a lot of water and rest. Take Delsym for cough and Mucinex as need. Take Tylenol or pain reliever every 4-6 hours as needed for pain/fever/body  ache. If you have elevated blood pressure, you can take OTC Corcidin. You can also take OTC oscillococcinum to help with your symptoms.  Educated patient if symptoms get worse or if she experiences any SOB, chest pain or pain in her legs to seek immediate emergency care. Continue to monitor your oxygen levels. Call us if you have any questions. Quarantine for 5 days if tested positive and no symptoms or 10 days if tested positive and have symptoms. Wear a mask around other people. He does go to Dialysis so he is to keep his mask on  2. ESRD (end stage renal disease) ([place]) Paxlovid is contraindicated,  however overall he is doing well.   COVID-19 Education: The signs and symptoms of COVID-19 were discussed with the patient and how to seek care for testing (follow up with PCP or arrange E-visit).  The importance of social distancing was discussed today.  Patient Risk:   After full review of this patients clinical status, I feel that they are at least moderate risk at this time.  Time:   Today, I have spent 9.25 minutes/ seconds with the patient with telehealth technology discussing above diagnoses.     Medication Adjustments/Labs and Tests Ordered: Current medicines are reviewed at length with the patient today.  Concerns regarding medicines are outlined above.   Tests Ordered: No orders of the defined types were placed in this encounter.   Medication Changes: No orders of the defined types were placed in this encounter.   Disposition:  Follow up prn  Signed, [name], FNP

## 2020-08-21 NOTE — Telephone Encounter (Signed)
Patient consented to virtual appt YL,RMA

## 2020-08-21 NOTE — Patient Instructions (Signed)
COVID-19: Quarantine and Isolation Quarantine If you were exposed Quarantine and stay away from others when you have been in close contact with someone whohas COVID-19. Isolate If you are sick or test positive Isolate when you are sick or when you have COVID-19, even if you don't have symptoms. When to stay home Calculating quarantine The date of your exposure is considered day 0. Day 1 is the first full day after your last contact with a person who has had COVID-19. Stay home and away from other people for at least 5 days. Learn why CDC updated guidance for the general public. IF YOU were exposed to COVID-19 and are NOT up-to-date IF YOU were exposed to COVID-19 and are NOT on COVID-19 vaccinations Quarantine for at least 5 days Stay home Stay home and quarantine for at least 5 full days. Wear a well-fitted mask if you must be around others in your home. Do not travel. Get tested Even if you don't develop symptoms, get tested at least 5 days after you last had close contact with someone with COVID-19. After quarantine Watch for symptoms Watch for symptoms until 10 days after you last had close contact with someone with COVID-19. Avoid travel It is best to avoid travel until a full 10 days after you last had close contact with someone with COVID-19. If you develop symptoms Isolate immediately and get tested. Continue to stay home until you know the results. Wear a well-fitted mask around others. Take precautions until day 10 Wear a mask Wear a well-fitted mask for 10 full days any time you are around others inside your home or in public. Do not go to places where you are unable to wear a mask. If you must travel during days 6-10, take precautions. Avoid being around people who are at high risk IF YOU were exposed to COVID-19 and are up-to-date IF YOU were exposed to COVID-19 and are on COVID-19 vaccinations No quarantine You do not need to stay home unless you develop  symptoms. Get tested Even if you don't develop symptoms, get tested at least 5 days after you last had close contact with someone with COVID-19. Watch for symptoms Watch for symptoms until 10 days after you last had close contact with someone with COVID-19. If you develop symptoms Isolate immediately and get tested. Continue to stay home until you know the results. Wear a well-fitted mask around others. Take precautions until day 10 Wear a mask Wear a well-fitted mask for 10 full days any time you are around others inside your home or in public. Do not go to places where you are unable to wear a mask. Take precautions if traveling Avoid being around people who are at high risk IF YOU were exposed to COVID-19 and had confirmed COVID-19 within the past 90 days (you tested positive using a viral test) No quarantine You do not need to stay home unless you develop symptoms. Watch for symptoms Watch for symptoms until 10 days after you last had close contact with someone with COVID-19. If you develop symptoms Isolate immediately and get tested. Continue to stay home until you know the results. Wear a well-fitted mask around others. Take precautions until day 10 Wear a mask Wear a well-fitted mask for 10 full days any time you are around others inside your home or in public. Do not go to places where you are unable to wear a mask. Take precautions if traveling Avoid being around people who are at high risk Calculating isolation  Day 0 is your first day of symptoms or a positive viral test. Day 1 is the first full day after your symptoms developed or your test specimen was collected. If you have COVID-19 or have symptoms, isolate for at least 5 days. IF YOU tested positive for COVID-19 or have symptoms, regardless of vaccination status Stay home for at least 5 days Stay home for 5 days and isolate from others in your home. Wear a well-fitted mask if you must be around others in your home. Do not  travel. Ending isolation if you had symptoms End isolation after 5 full days if you are fever-free for 24 hours (without the use of fever-reducing medication) and your symptoms are improving. Ending isolation if you did NOT have symptoms End isolation after at least 5 full days after your positive test. If you were severely ill with COVID-19 or are immunocompromised You should isolate for at least 10 days. Consult your doctor before ending isolation. Take precautions until day 10 Wear a mask Wear a well-fitted mask for 10 full days any time you are around others inside your home or in public. Do not go to places where you are unable to wear a mask. Do not travel Do not travel until a full 10 days after your symptoms started or the date your positive test was taken if you had no symptoms. Avoid being around people who are at high risk Definitions Exposure Contact with someone infected with SARS-CoV-2, the virus that causes COVID-19,in a way that increases the likelihood of getting infected with the virus. Close contact A close contact is someone who was less than 6 feet away from an infected person (laboratory-confirmed or a clinical diagnosis) for a cumulative total of 15 minutes or more over a 24-hour period. For example, three individual 5-minute exposures for a total of 15 minutes. People who are exposed to someone with COVID-19 after they completed at least 5 days of isolation are notconsidered close contacts. Julio Sicks is a strategy used to prevent transmission of COVID-19 by keeping people who have been in close contact with someone with COVID-19 apart from others. Who does not need to quarantine? If you had close contact with someone with COVID-19 and you are in one of the following groups, you do not need to quarantine. You are up to date with your COVID-19 vaccines. You had confirmed COVID-19 within the last 90 days (meaning you tested positive using a viral test). You  should wear a well-fitting mask around others for 10 days from the date of your last close contact with someone with COVID-19 (the date of last close contact is considered day 0). Get tested at least 5 days after you last had close contact with someone with COVID-19. If you test positive or develop COVID-19 symptoms, isolate from other people and follow recommendations in the Isolation section below. If you tested positive for COVID-19 with a viral test within the previous 90 days and subsequently recovered and remain without COVID-19 symptoms, you do not need to quarantine or get tested after close contact. You should wear a well-fitting mask around others for 10 days from the date of your last close contact withsomeone with COVID-19 (the date of last close contact is considered day 0). Who should quarantine? If you come into close contact with someone with COVID-19, you should quarantine if you are not up to date on COVID-19 vaccines. This includes people who are not vaccinated. What to do for quarantine Stay home and away  from other people for at least 5 days (day 0 through day 5) after your last contact with a person who has COVID-19. The date of your exposure is considered day 0. Wear a well-fitting mask when around others at home, if possible. For 10 days after your last close contact with someone with COVID-19, watch for fever (100.66F or greater), cough, shortness of breath, or other COVID-19 symptoms. If you develop symptoms, get tested immediately and isolate until you receive your test results. If you test positive, follow isolation recommendations. If you do not develop symptoms, get tested at least 5 days after you last had close contact with someone with COVID-19. If you test negative, you can leave your home, but continue to wear a well-fitting mask when around others at home and in public until 10 days after your last close contact with someone with COVID-19. If you test positive, you should  isolate for at least 5 days from the date of your positive test (if you do not have symptoms). If you do develop COVID-19 symptoms, isolate for at least 5 days from the date your symptoms began (the date the symptoms started is day 0). Follow recommendations in the isolation section below. If you are unable to get a test 5 days after last close contact with someone with COVID-19, you can leave your home after day 5 if you have been without COVID-19 symptoms throughout the 5-day period. Wear a well-fitting mask for 10 days after your date of last close contact when around others at home and in public. Avoid people who are immunocompromised or at high risk for severe disease, and nursing homes and other high-risk settings, until after at least 10 days. If possible, stay away from people you live with, especially people who are at higher risk for getting very sick from COVID-19, as well as others outside your home throughout the full 10 days after your last close contact with someone with COVID-19. If you are unable to quarantine, you should wear a well-fitting mask for 10 days when around others at home and in public. If you are unable to wear a mask when around others, you should continue to quarantine for 10 days. Avoid people who are immunocompromised or at high risk for severe disease, and nursing homes and other high-risk settings, until after at least 10 days. See additional information about travel. Do not go to places where you are unable to wear a mask, such as restaurants and some gyms, and avoid eating around others at home and at work until after 10 days after your last close contact with someone with COVID-19. After quarantine Watch for symptoms until 10 days after your last close contact with someone with COVID-19. If you have symptoms, isolate immediately and get tested. Quarantine in high-risk congregate settings In certain congregate settings that have high risk of secondary transmission  (such as Systems analyst and detention facilities, homeless shelters, or cruise ships), CDC recommends a 10-day quarantine for residents, regardless of vaccination and booster status. During periods of critical staffing shortages, facilities may consider shortening the quarantine period for staff to ensure continuity of operations. Decisions to shorten quarantine in these settings should be made in consultation with state, local, tribal, or territorial health departments and should take into consideration the context and characteristics of the facility. CDC's setting-specific guidance provides additional recommendations for these settings. Isolation Isolation is used to separate people with confirmed or suspected COVID-19 from those without COVID-19. People who are in isolation should stay  home until it's safe for them to be around others. At home, anyone sick or infected should separate from others, or wear a well-fitting mask when they need to be around others. People in isolation should stay in a specific "sick room" or area and use a separate bathroom if available. Everyone who has presumed or confirmed COVID-19 should stay home and isolate from other people for at least 5 full days (day 0 is the first day of symptoms or the date of the day of the positive viral test for asymptomatic persons). They should wear a mask when around others at home and in public for an additional 5 days. People who are confirmed to have COVID-19 or are showing symptoms of COVID-19 need to isolate regardless of their vaccination status. This includes: People who have a positive viral test for COVID-19, regardless of whether or not they have symptoms. People with symptoms of COVID-19, including people who are awaiting test results or have not been tested. People with symptoms should isolate even if they do not know if they have been in close contact with someone with COVID-19. What to do for isolation Monitor your symptoms. If you  have an emergency warning sign (including trouble breathing), seek emergency medical care immediately. Stay in a separate room from other household members, if possible. Use a separate bathroom, if possible. Take steps to improve ventilation at home, if possible. Avoid contact with other members of the household and pets. Don't share personal household items, like cups, towels, and utensils. Wear a well-fitting mask when you need to be around other people. Learn more about what to do if you are sick and how to notify your contacts. Ending isolation for people who had COVID-19 and had symptoms If you had COVID-19 and had symptoms, isolate for at least 5 days. To calculate your 5-day isolation period, day 0 is your first day of symptoms. Day 1 is the first full day after your symptoms developed. You can leave isolation after 5 full days. You can end isolation after 5 full days if you are fever-free for 24 hours without the use of fever-reducing medication and your other symptoms have improved (Loss of taste and smell may persist for weeks or months after recovery and need not delay the end of isolation). You should continue to wear a well-fitting mask around others at home and in public for 5 additional days (day 6 through day 10) after the end of your 5-day isolation period. If you are unable to wear a mask when around others, you should continue to isolate for a full 10 days. Avoid people who are immunocompromised or at high risk for severe disease, and nursing homes and other high-risk settings, until after at least 10 days. If you continue to have fever or your other symptoms have not improved after 5 days of isolation, you should wait to end your isolation until you are fever-free for 24 hours without the use of fever-reducing medication and your other symptoms have improved. Continue to wear a well-fitting mask. Contact your healthcare provider if you have questions. See additional information about  travel. Do not go to places where you are unable to wear a mask, such as restaurants and some gyms, and avoid eating around others at home and at work until a full 10 days after your first day of symptoms. If an individual has access to a test and wants to test, the best approach is to use an antigen test1 towards the end of  the 5-day isolation period. Collect the test sample only if you are fever-free for 24 hours without the use of fever-reducing medication and your other symptoms have improved (loss of taste and smell may persist for weeks or months after recovery and need not delay the end of isolation). If your test result is positive, you should continue to isolate until day 10. If your test result is negative, you can end isolation, but continue to wear a well-fitting mask around others at home and in public until day 10. Follow additional recommendations for masking and avoiding travel as described above. 1As noted in the labeling for authorized over-the counter antigen tests: Negative results should be treated as presumptive. Negative results do not rule out SARS-CoV-2 infection and should not be used as the sole basis for treatment or patient management decisions, including infection control decisions. To improve results, antigen tests should be used twice over a three-day period with at least 24 hours and no more than 48 hours between tests. Note that these recommendations on ending isolation do not apply to people with moderate or severe COVID-19 or with weakened immune systems (immunocompromised). See section below for recommendations for when toend isolation for these groups. Ending isolation for people who tested positive for COVID-19 but had no symptoms If you test positive for COVID-19 and never develop symptoms, isolate for at least 5 days. Day 0 is the day of your positive viral test (based on the date you were tested) and day 1 is the first full day after the specimen was collected for your  positive test. You can leave isolation after 5 full days. If you continue to have no symptoms, you can end isolation after at least 5 days. You should continue to wear a well-fitting mask around others at home and in public until day 10 (day 6 through day 10). If you are unable to wear a mask when around others, you should continue to isolate for 10 days. Avoid people who are immunocompromised or at high risk for severe disease, and nursing homes and other high-risk settings, until after at least 10 days. If you develop symptoms after testing positive, your 5-day isolation period should start over. Day 0 is your first day of symptoms. Follow the recommendations above for ending isolation for people who had COVID-19 and had symptoms. See additional information about travel. Do not go to places where you are unable to wear a mask, such as restaurants and some gyms, and avoid eating around others at home and at work until 10 days after the day of your positive test. If an individual has access to a test and wants to test, the best approach is to use an antigen test1 towards the end of the 5-day isolation period. If your test result is positive, you should continue to isolate until day 10. If your test result is negative, you can end isolation, but continue to wear a well-fitting mask around others at home and in public until day 10. Follow additionalrecommendations for masking and avoiding travel as described above. 1As noted in the labeling for authorized over-the counter antigen tests: Negative results should be treated as presumptive. Negative results do not rule out SARS-CoV-2 infection and should not be used as the sole basis for treatment or patient management decisions, including infection control decisions. To improve results, antigen tests should be used twice over a three-day period with at least 24 hours and no more than 48 hours between tests. Ending isolation for people who were  severely ill with  COVID-19 or have a weakened immune system (immunocompromised) People who are severely ill with COVID-19 (including those who were hospitalized or required intensive care or ventilation support) and people with compromised immune systems might need to isolate at home longer. They may also require testing with a viral test to determine when they can be around others. CDC recommends an isolation period of at least 10 and up to 20 days for people who were severely ill with COVID-19 and for people with weakened immune systems. Consult with your healthcare provider about when you can resume being aroundother people. People who are immunocompromised should talk to their healthcare provider about the potential for reduced immune responses to COVID-19 vaccines and the need to continue to follow current prevention measures (including wearing a well-fitting mask, staying 6 feet apart from others they don't live with, and avoiding crowds and poorly ventilated indoor spaces) to protect themselves against COVID-19 until advised otherwise by their healthcare provider. Close contacts of immunocompromised people--including household members--should also be encouraged to receive all recommended COVID-19 vaccine doses to help protect these people. Isolation in high-risk congregate settings In certain high-risk congregate settings that have high risk of secondary transmission and where it is not feasible to cohort people (such as Systems analyst and detention facilities, homeless shelters, and cruise ships), CDC recommends a 10-day isolation period for residents. During periods of critical staffing shortages, facilities may consider shortening the isolation period for staff to ensure continuity of operations. Decisions to shorten isolation in these settings should be made in consultation with state, local, tribal, or territorial health departments and should take into consideration the context and characteristics of the facility.  CDC's setting-specific guidance provides additional recommendations for these settings. This CDC guidance is meant to supplement--not replace--any federal, state, local,territorial, or tribal health and safety laws, rules, and regulations. Recommendations for specific settings These recommendations do not apply to healthcare professionals. For guidance specific to these settings, see Healthcare professionals: Interim Guidance for Optician, dispensing with SARS-CoV-2 Infection or Exposure to SARS-CoV-2 Patients, residents, and visitors to healthcare settings: Interim Infection Prevention and Control Recommendations for Healthcare Personnel During the [place] 2019 (COVID-19) Pandemic Additional setting-specific guidance and recommendations are available. These recommendations on quarantine and isolation do apply to [facility] settings. Additional guidance is available here: Overview of COVID-19 Quarantine for K-12 Schools Travelers: Travel information and recommendations Congregate facilities and other settings: [employer] for community, work, and school settings Ongoing COVID-19 exposure FAQs I live with someone with COVID-19, but I cannot be separated from them. How do we manage quarantine in this situation? It is very important for people with COVID-19 to remain apart from other people, if possible, even if they are living together. If separation of the person with COVID-19 from others that they live with is not possible, the other people that they live with will have ongoing exposure, meaning they will be repeatedly exposed until that person is no longer able to spread the virus to other people. In this situation, there are precautions you can take to limit the spread of COVID-19: The person with COVID-19 and everyone they live with should wear a well-fitting mask inside the home. If possible, one person should care for the person with COVID-19 to limit the number of people  who are in close contact with the infected person. Take steps to protect yourself and others to reduce transmission in the home: Quarantine if you are not up to date with your COVID-19  vaccines. Isolate if you are sick or tested positive for COVID-19, even if you don't have symptoms. Learn more about the public health recommendations for testing, mask use and quarantine of close contacts, like yourself, who have ongoing exposure. These recommendations differ depending on your vaccination status. What should I do if I have ongoing exposure to COVID-19 from someone I live with? Recommendations for this situation depend on your vaccination status: If you are not up to date on COVID-19 vaccines and have ongoing exposure to COVID-19, you should: Begin quarantine immediately and continue to quarantine throughout the isolation period of the person with COVID-19. Continue to quarantine for an additional 5 days starting the day after the end of isolation for the person with COVID-19. Get tested at least 5 days after the end of isolation of the infected person that lives with them. If you test negative, you can leave the home but should continue to wear a well-fitting mask when around others at home and in public until 10 days after the end of isolation for the person with COVID-19. Isolate immediately if you develop symptoms of COVID-19 or test positive. If you are up to date with COVID-19 vaccines and have ongoing exposure to COVID-19, you should: Get tested at least 5 days after your first exposure. A person with COVID-19 is considered infectious starting 2 days before they develop symptoms, or 2 days before the date of their positive test if they do not have symptoms. Get tested again at least 5 days after the end of isolation for the person with COVID-19. Wear a well-fitting mask when you are around the person with COVID-19, and do this throughout their isolation period. Wear a well-fitting mask around  others for 10 days after the infected person's isolation period ends. Isolate immediately if you develop symptoms of COVID-19 or test positive. What should I do if multiple people I live with test positive for COVID-19 at different times? Recommendations for this situation depend on your vaccination status: If you are not up to date with your COVID-19 vaccines, you should: Quarantine throughout the isolation period of any infected person that you live with. Continue to quarantine until 5 days after the end of isolation date for the most recently infected person that lives with you. For example, if the last day of isolation of the person most recently infected with COVID-19 was June 30, the new 5-day quarantine period starts on July 1. Get tested at least 5 days after the end of isolation for the most recently infected person that lives with you. Wear a well-fitting mask when you are around any person with COVID-19 while that person is in isolation. Wear a well-fitting mask when you are around other people until 10 days after your last close contact. Isolate immediately if you develop symptoms of COVID-19 or test positive. If you are up to date with COVID-19 your vaccines , you should: Get tested at least 5 days after your first exposure. A person with COVID-19 is considered infectious starting 2 days before they developed symptoms, or 2 days before the date of their positive test if they do not have symptoms. Get tested again at least 5 days after the end of isolation for the most recently infected person that lives with you. Wear a well-fitting mask when you are around any person with COVID-19 while that person is in isolation. Wear a well-fitting mask around others for 10 days after the end of isolation for the most recently infected person that  lives with you. For example, if the last day of isolation for the person most recently infected with COVID-19 was June 30, the new 10-day period to wear a  well-fitting mask indoors in public starts on July 1. Isolate immediately if you develop symptoms of COVID-19 or test positive. I had COVID-19 and completed isolation. Do I have to quarantine or get tested if someone I live with gets COVID-19 shortly after I completed isolation? No. If you recently completed isolation and someone that lives with you tests positive for the virus that causes COVID-19 shortly after the end of your isolation period, you do not have to quarantine or get tested as long as you do not develop new symptoms. Once all of the people that live together have completed isolation or quarantine, refer to the guidance below for new exposures to COVID-19. If you had COVID-19 in the previous 90 days and then came into close contact with someone with COVID-19, you do not have to quarantine or get tested if you do not have symptoms. But you should: Wear a well-fitting mask indoors in public for 10 days after exposure. Monitor for COVID-19 symptoms and isolate immediately if symptoms develop. Consult with a healthcare provider for testing recommendations if new symptoms develop. If more than 90 days have passed since your recovery from infection, follow CDC's recommendations for close contacts. These recommendations will differ depending on your vaccination status. 02/24/2020 Content source: [facility] ([facility]), Division of Viral Diseases This information is not intended to replace advice given to you by your health care provider. Make sure you discuss any questions you have with your healthcare provider. Document Revised: 03/03/2020 Document Reviewed: 03/03/2020 Elsevier Patient Education  [place].

## 2020-08-21 NOTE — Telephone Encounter (Signed)
Transition Care Management Follow-up Telephone Call Date of discharge and from where: [facility] 08/19/2020.  How have you been since you were released from the hospital? Pt states he has had congestion and a little cough but he does not feel bad.  Any questions or concerns? No  Items Reviewed: Did the pt receive and understand the discharge instructions provided? Yes  Medications obtained and verified? Yes  Other? No  Any new allergies since your discharge? No  Dietary orders reviewed? Yes Do you have support at home? Yes   Home Care and Equipment/Supplies: Were home health services ordered? not applicable If so, what is the name of the agency? N/a   Has the agency set up a time to come to the patient's home? not applicable Were any new equipment or medical supplies ordered?  No What is the name of the medical supply agency? N/a  Were you able to get the supplies/equipment? not applicable Do you have any questions related to the use of the equipment or supplies? No  Functional Questionnaire: (I = Independent and D = Dependent) ADLs: I  Bathing/Dressing- I  Meal Prep- I  Eating- I  Maintaining continence- I  Transferring/Ambulation- I  Managing Meds- I  Follow up appointments reviewed:  PCP Hospital f/u appt confirmed? Yes  Scheduled to see [name] on 08/21/2020 @ 4:30pm. [facility] f/u appt confirmed? No  Scheduled to see n/a  on n/a  @ n/a . Are transportation arrangements needed? No  If their condition worsens, is the pt aware to call PCP or go to the Emergency Dept.? Yes Was the patient provided with contact information for the PCP's office or ED? Yes Was to pt encouraged to call back with questions or concerns? Yes

## 2020-08-22 DIAGNOSIS — N186 End stage renal disease: Secondary | ICD-10-CM | POA: Diagnosis not present

## 2020-08-22 DIAGNOSIS — Z992 Dependence on renal dialysis: Secondary | ICD-10-CM | POA: Diagnosis not present

## 2020-08-22 DIAGNOSIS — N2581 Secondary hyperparathyroidism of renal origin: Secondary | ICD-10-CM | POA: Diagnosis not present

## 2020-08-22 NOTE — Addendum Note (Signed)
Addended by: [name] F on: 08/22/2020 06:07 PM   Modules accepted: Level of Service

## 2020-08-24 DIAGNOSIS — N2581 Secondary hyperparathyroidism of renal origin: Secondary | ICD-10-CM | POA: Diagnosis not present

## 2020-08-24 DIAGNOSIS — Z992 Dependence on renal dialysis: Secondary | ICD-10-CM | POA: Diagnosis not present

## 2020-08-24 DIAGNOSIS — N186 End stage renal disease: Secondary | ICD-10-CM | POA: Diagnosis not present

## 2020-08-26 DIAGNOSIS — Z992 Dependence on renal dialysis: Secondary | ICD-10-CM | POA: Diagnosis not present

## 2020-08-26 DIAGNOSIS — N2581 Secondary hyperparathyroidism of renal origin: Secondary | ICD-10-CM | POA: Diagnosis not present

## 2020-08-26 DIAGNOSIS — N186 End stage renal disease: Secondary | ICD-10-CM | POA: Diagnosis not present

## 2020-08-27 DIAGNOSIS — I129 Hypertensive chronic kidney disease with stage 1 through stage 4 chronic kidney disease, or unspecified chronic kidney disease: Secondary | ICD-10-CM | POA: Diagnosis not present

## 2020-08-27 DIAGNOSIS — N186 End stage renal disease: Secondary | ICD-10-CM | POA: Diagnosis not present

## 2020-08-27 DIAGNOSIS — Z992 Dependence on renal dialysis: Secondary | ICD-10-CM | POA: Diagnosis not present

## 2020-08-27 DIAGNOSIS — U071 COVID-19: Secondary | ICD-10-CM | POA: Diagnosis not present

## 2020-08-29 DIAGNOSIS — N2581 Secondary hyperparathyroidism of renal origin: Secondary | ICD-10-CM | POA: Diagnosis not present

## 2020-08-29 DIAGNOSIS — Z992 Dependence on renal dialysis: Secondary | ICD-10-CM | POA: Diagnosis not present

## 2020-08-29 DIAGNOSIS — N186 End stage renal disease: Secondary | ICD-10-CM | POA: Diagnosis not present

## 2020-08-31 DIAGNOSIS — N186 End stage renal disease: Secondary | ICD-10-CM | POA: Diagnosis not present

## 2020-08-31 DIAGNOSIS — Z992 Dependence on renal dialysis: Secondary | ICD-10-CM | POA: Diagnosis not present

## 2020-08-31 DIAGNOSIS — N2581 Secondary hyperparathyroidism of renal origin: Secondary | ICD-10-CM | POA: Diagnosis not present

## 2020-09-02 DIAGNOSIS — N2581 Secondary hyperparathyroidism of renal origin: Secondary | ICD-10-CM | POA: Diagnosis not present

## 2020-09-02 DIAGNOSIS — N186 End stage renal disease: Secondary | ICD-10-CM | POA: Diagnosis not present

## 2020-09-02 DIAGNOSIS — Z992 Dependence on renal dialysis: Secondary | ICD-10-CM | POA: Diagnosis not present

## 2020-09-04 ENCOUNTER — Other Ambulatory Visit: Payer: Self-pay

## 2020-09-04 ENCOUNTER — Inpatient Hospital Stay: Payer: Medicare PPO | Attending: Hematology and Oncology | Admitting: Hematology and Oncology

## 2020-09-04 ENCOUNTER — Inpatient Hospital Stay: Payer: Medicare PPO

## 2020-09-04 ENCOUNTER — Encounter: Payer: Self-pay | Admitting: Hematology and Oncology

## 2020-09-04 DIAGNOSIS — D631 Anemia in chronic kidney disease: Secondary | ICD-10-CM | POA: Diagnosis not present

## 2020-09-04 DIAGNOSIS — C8338 Diffuse large B-cell lymphoma, lymph nodes of multiple sites: Secondary | ICD-10-CM | POA: Insufficient documentation

## 2020-09-04 DIAGNOSIS — Z79899 Other long term (current) drug therapy: Secondary | ICD-10-CM | POA: Diagnosis not present

## 2020-09-04 DIAGNOSIS — N186 End stage renal disease: Secondary | ICD-10-CM | POA: Diagnosis not present

## 2020-09-04 DIAGNOSIS — Z992 Dependence on renal dialysis: Secondary | ICD-10-CM | POA: Insufficient documentation

## 2020-09-04 LAB — CBC WITH DIFFERENTIAL (CANCER CENTER ONLY)
Abs Immature Granulocytes: 0.01 10*3/uL (ref 0.00–0.07)
Basophils Absolute: 0.1 10*3/uL (ref 0.0–0.1)
Basophils Relative: 1 %
Eosinophils Absolute: 0.7 10*3/uL — ABNORMAL HIGH (ref 0.0–0.5)
Eosinophils Relative: 11 %
HCT: 34.2 % — ABNORMAL LOW (ref 39.0–52.0)
Hemoglobin: 11.2 g/dL — ABNORMAL LOW (ref 13.0–17.0)
Immature Granulocytes: 0 %
Lymphocytes Relative: 27 %
Lymphs Abs: 1.9 10*3/uL (ref 0.7–4.0)
MCH: 32.7 pg (ref 26.0–34.0)
MCHC: 32.7 g/dL (ref 30.0–36.0)
MCV: 100 fL (ref 80.0–100.0)
Monocytes Absolute: 0.8 10*3/uL (ref 0.1–1.0)
Monocytes Relative: 12 %
Neutro Abs: 3.4 10*3/uL (ref 1.7–7.7)
Neutrophils Relative %: 49 %
Platelet Count: 234 10*3/uL (ref 150–400)
RBC: 3.42 MIL/uL — ABNORMAL LOW (ref 4.22–5.81)
RDW: 15.4 % (ref 11.5–15.5)
WBC Count: 6.9 10*3/uL (ref 4.0–10.5)
nRBC: 0 % (ref 0.0–0.2)

## 2020-09-04 NOTE — Assessment & Plan Note (Signed)
He has recent CT imaging done elsewhere which showed no evidence of lymphoma recurrence Clinically, he has no signs of cancer recurrence The patient is educated to watch for signs and symptoms of cancer recurrence The patient is a long-term cancer survivor He does not need long-term follow-up

## 2020-09-04 NOTE — Progress Notes (Signed)
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 The patient denies recent history of bleeding such as epistaxis, hematuria or hematochezia. He is asymptomatic from the anemia. We will observe for now.  I would defer ESA management to nephrologist.  End stage renal failure on dialysis [facility]) The patient is doing well with hemodialysis He is wondering whether he might be a transplant candidate because he is now a long-term cancer survivor I do not know the criteria for him to get kidney transplant will defer to his nephrologist to make that determination  No orders of the defined types were placed in this encounter.   All questions were answered. The patient knows to call the clinic with any problems, questions or concerns. The total time spent in the appointment was 20 minutes encounter with patients including review of chart and various tests results, discussions about plan of care and coordination of care plan   Heath Lark, MD 09/04/2020 4:19 PM  INTERVAL HISTORY: Please see below for problem oriented charting. Returns for lymphoma follow-up He is doing well No recent infection No new lymphadenopathy  SUMMARY OF ONCOLOGIC HISTORY: Oncology History  Diffuse large B-cell lymphoma of lymph  nodes of multiple sites ([place])  06/07/2015 Initial Diagnosis   Non-Hodgkin lymphoma of intra-abdominal lymph nodes ([place])    06/12/2015 Imaging   CT chest showed lymphadenopathy within the low neck and low chest, consistent with active lymphoma    06/14/2015 Procedure   He underwent CT-guided biopsy    06/14/2015 Pathology Results   Biopsy of the retroperitoneal mass confirmed diffuse large B-cell lymphoma    06/21/2015 Imaging   ECHO showed normal EF    06/21/2015 Procedure   He has port placement    06/23/2015 - 10/12/2015 Chemotherapy   He received mini-RCHOP chemo x 6 cycles   08/25/2015 PET scan   Significant interval decrease in size of the bulky retroperitoneal lymphadenopathy suggesting an excellent response to therapy. Residual matted soft tissue density in the retroperitoneum with SUV max of approximately 3.3    11/27/2015 PET scan   Continued improved appearance of the treated lymphoma. Minimal residual matted soft tissue density but no discrete measurable disease and no hypermetabolism to suggest metabolically active tumor.    05/24/2016 Imaging   Unchanged retroperitoneal soft tissue most compatible with history of treated lymphoma. No new or enlarging adenopathy in the chest, abdomen or pelvis. Polycystic renal disease. Bilateral fat containing inguinal hernias. Aortic atherosclerosis.    01/15/2017 Imaging   1. Stable findings of treated tumor in the retroperitoneum and right pelvis. No evidence of recurrent lymphoma in the abdomen or pelvis. 2. New 2.1 cm nodular opacity in the dependent right lower lobe, nonspecific, more likely inflammatory. No thoracic adenopathy. Recommend attention on follow-up chest CT in 3 months. 3. Polycystic kidneys. Increased thin mural calcification associated with the dominant  exophytic 6.2 cm medial lower left renal cyst, which is stable in size and technically indeterminate for neoplasm. Presumably, an IV contrast-enhanced study is  precluded by poor renal function. Given the size stability, continued surveillance at CT abdomen is a reasonable option. 4. Chronic findings include: Aortic Atherosclerosis (ICD10-I70.0). Small hiatal hernia. Mild terminal ileum and large bowel diverticulosis. Mild prostatomegaly.    04/17/2017 Imaging   CT chest 1. Stable pleural or subpleural cyst in the right lower lobe. No worrisome pulmonary lesions or acute pulmonary findings. 2. No mediastinal or hilar mass or adenopathy. No supraclavicular or axillary adenopathy. CT abdomen 3. No abdominal/pelvic/inguinal lymphadenopathy. 4. Polycystic kidney disease. 5. No acute abdominal/pelvic findings.    05/12/2017 Procedure   Technically successful tunneled Port catheter removal.    01/12/2018 Procedure   He had negative colonoscopy.  Internal hemorrhoids were noted.  Sessile polyp biopsy came back tubular adenoma     08/16/2019 Imaging   CT imaging studies elsewhere Mildly tortuous, minimally calcified bilateral iliac vasculature.   -Bilateral multicystic kidneys with minimal appreciable renal parenchyma. No definite evidence of malignant appearing lesions; however, lack of contrast significantly limits evaluation.   -Similar-appearing 2.7 cm left hepatic lobe hypodensity probably normal left portal vasculature, less likely focal fatty infiltrate or hemangioma. Given interval stability, nonaggressive process is favored. If clinical concern persists, contrast-enhanced ultrasound or MRI of the abdomen may be performed for further evaluation if clinically indicated     REVIEW OF SYSTEMS:   Constitutional: Denies fevers, chills or abnormal weight loss Eyes: Denies blurriness of vision Ears, nose, mouth, throat, and face: Denies mucositis or sore throat Respiratory: Denies cough, dyspnea or wheezes Cardiovascular: Denies palpitation, chest discomfort or lower extremity swelling Gastrointestinal:  Denies nausea, heartburn or change in  bowel habits Skin: Denies abnormal skin rashes Lymphatics: Denies new lymphadenopathy or easy bruising Neurological:Denies numbness, tingling or new weaknesses Behavioral/Psych: Mood is stable, no new changes  All other systems were reviewed with the patient and are negative.  I have reviewed the past medical history, past surgical history, social history and family history with the patient and they are unchanged from previous note.  ALLERGIES:  is allergic to tape.  MEDICATIONS:  Current Outpatient Medications  Medication Sig Dispense Refill   aspirin 325 MG tablet Take 1 tablet (325 mg total) by mouth daily. 30 tablet 0   atorvastatin (LIPITOR) 10 MG tablet TAKE 1 TABLET BY MOUTH EVERY DAY 90 tablet 1   cinacalcet (SENSIPAR) 30 MG tablet Take 30 mg by mouth daily.     ferric citrate (AURYXIA) 1 GM 210 MG(Fe) tablet Take 420 mg by mouth 3 (three) times daily with meals.     levETIRAcetam (KEPPRA) 500 MG tablet TAKE 1 TABLET BY MOUTH TWICE A DAY 180 tablet 1   levothyroxine (SYNTHROID) 25 MCG tablet TAKE 1 TABLET BY MOUTH EVERY DAY 90 tablet 1   metoprolol tartrate (LOPRESSOR) 100 MG tablet TAKE 1 TABLET BY MOUTH TWICE A DAY 180 tablet 0   multivitamin (RENA-VIT) TABS tablet Take 1 tablet by mouth at bedtime. 90 tablet 0   No current facility-administered medications for this visit.    PHYSICAL EXAMINATION: ECOG PERFORMANCE STATUS: 1 - Symptomatic but completely ambulatory  Vitals:   09/04/20 0942  BP: 125/75  Pulse: 68  Resp: 18  Temp: 97.6 F (36.4 C)  SpO2: 99%   Filed Weights   09/04/20 0942  Weight: 208 lb 12.8 oz (94.7 kg)    GENERAL:alert, no distress and comfortable  SKIN: skin color, texture, turgor are normal, no rashes or significant lesions EYES: normal, Conjunctiva are pink and non-injected, sclera clear OROPHARYNX:no exudate, no erythema and lips, buccal mucosa, and tongue normal  NECK: supple, thyroid normal size, non-tender, without nodularity LYMPH:  no  palpable lymphadenopathy in the cervical, axillary or inguinal LUNGS: clear to auscultation and percussion with normal breathing effort HEART: regular rate & rhythm and no murmurs and no lower extremity edema ABDOMEN:abdomen soft, non-tender and normal bowel sounds Musculoskeletal:no cyanosis of digits and no clubbing  NEURO: alert & oriented x 3 with fluent speech, persistent hemiparesis, stable  LABORATORY DATA:  I have reviewed the data as listed    Component Value Date/Time   NA 137 08/14/2020 1042   NA 138 07/08/2016 1052   K 5.6 (H) 08/14/2020 1042   K 4.3 07/08/2016 1052   CL 94 (L) 08/14/2020 1042   CO2 25 08/14/2020 1042   CO2 29 07/08/2016 1052   GLUCOSE 114 (H) 08/14/2020 1042   GLUCOSE 105 (H) 09/04/2018 0831   GLUCOSE 87 07/08/2016 1052   BUN 35 (H) 08/14/2020 1042   BUN 42.7 (H) 07/08/2016 1052   CREATININE 9.63 (H) 08/14/2020 1042   CREATININE 8.7 (HH) 07/08/2016 1052   CALCIUM 8.5 (L) 08/14/2020 1042   CALCIUM 9.6 07/08/2016 1052   PROT 7.5 08/14/2020 1042   PROT 6.7 07/08/2016 1052   ALBUMIN 4.5 08/14/2020 1042   ALBUMIN 4.4 12/30/2016 1005   ALBUMIN 3.1 (L) 07/08/2016 1052   AST 13 08/14/2020 1042   AST 24 12/30/2016 1005   AST 15 07/08/2016 1052   ALT 13 08/14/2020 1042   ALT 22 12/30/2016 1005   ALT 14 07/08/2016 1052   ALKPHOS 99 08/14/2020 1042   ALKPHOS 72 12/30/2016 1005   ALKPHOS 62 07/08/2016 1052   BILITOT 0.3 08/14/2020 1042   BILITOT 0.41 07/08/2016 1052   GFRNONAA 5 (L) 04/05/2019 1615   GFRAA 6 (L) 04/05/2019 1615    No results found for: SPEP, UPEP  Lab Results  Component Value Date   WBC 6.9 09/04/2020   NEUTROABS 3.4 09/04/2020   HGB 11.2 (L) 09/04/2020   HCT 34.2 (L) 09/04/2020   MCV 100.0 09/04/2020   PLT 234 09/04/2020      Chemistry      Component Value Date/Time   NA 137 08/14/2020 1042   NA 138 07/08/2016 1052   K 5.6 (H) 08/14/2020 1042   K 4.3 07/08/2016 1052   CL 94 (L) 08/14/2020 1042   CO2 25 08/14/2020  1042   CO2 29 07/08/2016 1052   BUN 35 (H) 08/14/2020 1042   BUN 42.7 (H) 07/08/2016 1052   CREATININE 9.63 (H) 08/14/2020 1042   CREATININE 8.7 (HH) 07/08/2016 1052      Component Value Date/Time   CALCIUM 8.5 (L) 08/14/2020 1042   CALCIUM 9.6 07/08/2016 1052   ALKPHOS 99 08/14/2020 1042   ALKPHOS 72 12/30/2016 1005   ALKPHOS 62 07/08/2016 1052   AST 13 08/14/2020 1042   AST 24 12/30/2016 1005   AST 15 07/08/2016 1052   ALT 13 08/14/2020 1042   ALT 22 12/30/2016 1005   ALT 14 07/08/2016 1052   BILITOT 0.3 08/14/2020 1042   BILITOT 0.41 07/08/2016 1052

## 2020-09-04 NOTE — Assessment & Plan Note (Signed)
The patient is doing well with hemodialysis He is wondering whether he might be a transplant candidate because he is now a long-term cancer survivor I do not know the criteria for him to get kidney transplant will defer to his nephrologist to make that determination

## 2020-09-04 NOTE — Assessment & Plan Note (Signed)
This is likely anemia of chronic disease. The patient denies recent history of bleeding such as epistaxis, hematuria or hematochezia. He is asymptomatic from the anemia. We will observe for now.  I would defer ESA management to nephrologist. 

## 2020-09-05 DIAGNOSIS — N186 End stage renal disease: Secondary | ICD-10-CM | POA: Diagnosis not present

## 2020-09-05 DIAGNOSIS — Z992 Dependence on renal dialysis: Secondary | ICD-10-CM | POA: Diagnosis not present

## 2020-09-05 DIAGNOSIS — N2581 Secondary hyperparathyroidism of renal origin: Secondary | ICD-10-CM | POA: Diagnosis not present

## 2020-09-07 DIAGNOSIS — N2581 Secondary hyperparathyroidism of renal origin: Secondary | ICD-10-CM | POA: Diagnosis not present

## 2020-09-07 DIAGNOSIS — Z992 Dependence on renal dialysis: Secondary | ICD-10-CM | POA: Diagnosis not present

## 2020-09-07 DIAGNOSIS — N186 End stage renal disease: Secondary | ICD-10-CM | POA: Diagnosis not present

## 2020-09-09 DIAGNOSIS — N186 End stage renal disease: Secondary | ICD-10-CM | POA: Diagnosis not present

## 2020-09-09 DIAGNOSIS — N2581 Secondary hyperparathyroidism of renal origin: Secondary | ICD-10-CM | POA: Diagnosis not present

## 2020-09-09 DIAGNOSIS — Z992 Dependence on renal dialysis: Secondary | ICD-10-CM | POA: Diagnosis not present

## 2020-09-12 DIAGNOSIS — Z992 Dependence on renal dialysis: Secondary | ICD-10-CM | POA: Diagnosis not present

## 2020-09-12 DIAGNOSIS — N186 End stage renal disease: Secondary | ICD-10-CM | POA: Diagnosis not present

## 2020-09-12 DIAGNOSIS — N2581 Secondary hyperparathyroidism of renal origin: Secondary | ICD-10-CM | POA: Diagnosis not present

## 2020-09-13 DIAGNOSIS — L602 Onychogryphosis: Secondary | ICD-10-CM | POA: Diagnosis not present

## 2020-09-13 DIAGNOSIS — L84 Corns and callosities: Secondary | ICD-10-CM | POA: Diagnosis not present

## 2020-09-13 DIAGNOSIS — E1351 Other specified diabetes mellitus with diabetic peripheral angiopathy without gangrene: Secondary | ICD-10-CM | POA: Diagnosis not present

## 2020-09-14 DIAGNOSIS — Z992 Dependence on renal dialysis: Secondary | ICD-10-CM | POA: Diagnosis not present

## 2020-09-14 DIAGNOSIS — N2581 Secondary hyperparathyroidism of renal origin: Secondary | ICD-10-CM | POA: Diagnosis not present

## 2020-09-14 DIAGNOSIS — N186 End stage renal disease: Secondary | ICD-10-CM | POA: Diagnosis not present

## 2020-09-16 DIAGNOSIS — N186 End stage renal disease: Secondary | ICD-10-CM | POA: Diagnosis not present

## 2020-09-16 DIAGNOSIS — N2581 Secondary hyperparathyroidism of renal origin: Secondary | ICD-10-CM | POA: Diagnosis not present

## 2020-09-16 DIAGNOSIS — Z992 Dependence on renal dialysis: Secondary | ICD-10-CM | POA: Diagnosis not present

## 2020-09-19 DIAGNOSIS — N2581 Secondary hyperparathyroidism of renal origin: Secondary | ICD-10-CM | POA: Diagnosis not present

## 2020-09-19 DIAGNOSIS — Z992 Dependence on renal dialysis: Secondary | ICD-10-CM | POA: Diagnosis not present

## 2020-09-19 DIAGNOSIS — N186 End stage renal disease: Secondary | ICD-10-CM | POA: Diagnosis not present

## 2020-09-21 DIAGNOSIS — N186 End stage renal disease: Secondary | ICD-10-CM | POA: Diagnosis not present

## 2020-09-21 DIAGNOSIS — Z992 Dependence on renal dialysis: Secondary | ICD-10-CM | POA: Diagnosis not present

## 2020-09-21 DIAGNOSIS — N2581 Secondary hyperparathyroidism of renal origin: Secondary | ICD-10-CM | POA: Diagnosis not present

## 2020-09-23 DIAGNOSIS — Z992 Dependence on renal dialysis: Secondary | ICD-10-CM | POA: Diagnosis not present

## 2020-09-23 DIAGNOSIS — N186 End stage renal disease: Secondary | ICD-10-CM | POA: Diagnosis not present

## 2020-09-23 DIAGNOSIS — N2581 Secondary hyperparathyroidism of renal origin: Secondary | ICD-10-CM | POA: Diagnosis not present

## 2020-09-26 DIAGNOSIS — Z992 Dependence on renal dialysis: Secondary | ICD-10-CM | POA: Diagnosis not present

## 2020-09-26 DIAGNOSIS — N186 End stage renal disease: Secondary | ICD-10-CM | POA: Diagnosis not present

## 2020-09-26 DIAGNOSIS — N2581 Secondary hyperparathyroidism of renal origin: Secondary | ICD-10-CM | POA: Diagnosis not present

## 2020-09-27 DIAGNOSIS — N186 End stage renal disease: Secondary | ICD-10-CM | POA: Diagnosis not present

## 2020-09-27 DIAGNOSIS — Z992 Dependence on renal dialysis: Secondary | ICD-10-CM | POA: Diagnosis not present

## 2020-09-27 DIAGNOSIS — I129 Hypertensive chronic kidney disease with stage 1 through stage 4 chronic kidney disease, or unspecified chronic kidney disease: Secondary | ICD-10-CM | POA: Diagnosis not present

## 2020-09-28 DIAGNOSIS — N2581 Secondary hyperparathyroidism of renal origin: Secondary | ICD-10-CM | POA: Diagnosis not present

## 2020-09-28 DIAGNOSIS — Z992 Dependence on renal dialysis: Secondary | ICD-10-CM | POA: Diagnosis not present

## 2020-09-28 DIAGNOSIS — N186 End stage renal disease: Secondary | ICD-10-CM | POA: Diagnosis not present

## 2020-09-30 DIAGNOSIS — N186 End stage renal disease: Secondary | ICD-10-CM | POA: Diagnosis not present

## 2020-09-30 DIAGNOSIS — Z992 Dependence on renal dialysis: Secondary | ICD-10-CM | POA: Diagnosis not present

## 2020-09-30 DIAGNOSIS — N2581 Secondary hyperparathyroidism of renal origin: Secondary | ICD-10-CM | POA: Diagnosis not present

## 2020-10-03 DIAGNOSIS — N2581 Secondary hyperparathyroidism of renal origin: Secondary | ICD-10-CM | POA: Diagnosis not present

## 2020-10-03 DIAGNOSIS — Z992 Dependence on renal dialysis: Secondary | ICD-10-CM | POA: Diagnosis not present

## 2020-10-03 DIAGNOSIS — N186 End stage renal disease: Secondary | ICD-10-CM | POA: Diagnosis not present

## 2020-10-05 DIAGNOSIS — N2581 Secondary hyperparathyroidism of renal origin: Secondary | ICD-10-CM | POA: Diagnosis not present

## 2020-10-05 DIAGNOSIS — Z992 Dependence on renal dialysis: Secondary | ICD-10-CM | POA: Diagnosis not present

## 2020-10-05 DIAGNOSIS — N186 End stage renal disease: Secondary | ICD-10-CM | POA: Diagnosis not present

## 2020-10-07 DIAGNOSIS — Z992 Dependence on renal dialysis: Secondary | ICD-10-CM | POA: Diagnosis not present

## 2020-10-07 DIAGNOSIS — N2581 Secondary hyperparathyroidism of renal origin: Secondary | ICD-10-CM | POA: Diagnosis not present

## 2020-10-07 DIAGNOSIS — N186 End stage renal disease: Secondary | ICD-10-CM | POA: Diagnosis not present

## 2020-10-10 DIAGNOSIS — N2581 Secondary hyperparathyroidism of renal origin: Secondary | ICD-10-CM | POA: Diagnosis not present

## 2020-10-10 DIAGNOSIS — N186 End stage renal disease: Secondary | ICD-10-CM | POA: Diagnosis not present

## 2020-10-10 DIAGNOSIS — Z992 Dependence on renal dialysis: Secondary | ICD-10-CM | POA: Diagnosis not present

## 2020-10-12 DIAGNOSIS — N2581 Secondary hyperparathyroidism of renal origin: Secondary | ICD-10-CM | POA: Diagnosis not present

## 2020-10-12 DIAGNOSIS — Z992 Dependence on renal dialysis: Secondary | ICD-10-CM | POA: Diagnosis not present

## 2020-10-12 DIAGNOSIS — N186 End stage renal disease: Secondary | ICD-10-CM | POA: Diagnosis not present

## 2020-10-14 DIAGNOSIS — N2581 Secondary hyperparathyroidism of renal origin: Secondary | ICD-10-CM | POA: Diagnosis not present

## 2020-10-14 DIAGNOSIS — Z992 Dependence on renal dialysis: Secondary | ICD-10-CM | POA: Diagnosis not present

## 2020-10-14 DIAGNOSIS — N186 End stage renal disease: Secondary | ICD-10-CM | POA: Diagnosis not present

## 2020-10-17 DIAGNOSIS — Z992 Dependence on renal dialysis: Secondary | ICD-10-CM | POA: Diagnosis not present

## 2020-10-17 DIAGNOSIS — N2581 Secondary hyperparathyroidism of renal origin: Secondary | ICD-10-CM | POA: Diagnosis not present

## 2020-10-17 DIAGNOSIS — N186 End stage renal disease: Secondary | ICD-10-CM | POA: Diagnosis not present

## 2020-10-19 DIAGNOSIS — N2581 Secondary hyperparathyroidism of renal origin: Secondary | ICD-10-CM | POA: Diagnosis not present

## 2020-10-19 DIAGNOSIS — N186 End stage renal disease: Secondary | ICD-10-CM | POA: Diagnosis not present

## 2020-10-19 DIAGNOSIS — Z992 Dependence on renal dialysis: Secondary | ICD-10-CM | POA: Diagnosis not present

## 2020-10-21 DIAGNOSIS — Z992 Dependence on renal dialysis: Secondary | ICD-10-CM | POA: Diagnosis not present

## 2020-10-21 DIAGNOSIS — N186 End stage renal disease: Secondary | ICD-10-CM | POA: Diagnosis not present

## 2020-10-21 DIAGNOSIS — N2581 Secondary hyperparathyroidism of renal origin: Secondary | ICD-10-CM | POA: Diagnosis not present

## 2020-10-24 DIAGNOSIS — Z992 Dependence on renal dialysis: Secondary | ICD-10-CM | POA: Diagnosis not present

## 2020-10-24 DIAGNOSIS — N186 End stage renal disease: Secondary | ICD-10-CM | POA: Diagnosis not present

## 2020-10-24 DIAGNOSIS — N2581 Secondary hyperparathyroidism of renal origin: Secondary | ICD-10-CM | POA: Diagnosis not present

## 2020-10-26 DIAGNOSIS — N2581 Secondary hyperparathyroidism of renal origin: Secondary | ICD-10-CM | POA: Diagnosis not present

## 2020-10-26 DIAGNOSIS — Z992 Dependence on renal dialysis: Secondary | ICD-10-CM | POA: Diagnosis not present

## 2020-10-26 DIAGNOSIS — N186 End stage renal disease: Secondary | ICD-10-CM | POA: Diagnosis not present

## 2020-10-27 DIAGNOSIS — N186 End stage renal disease: Secondary | ICD-10-CM | POA: Diagnosis not present

## 2020-10-27 DIAGNOSIS — Z992 Dependence on renal dialysis: Secondary | ICD-10-CM | POA: Diagnosis not present

## 2020-10-27 DIAGNOSIS — I129 Hypertensive chronic kidney disease with stage 1 through stage 4 chronic kidney disease, or unspecified chronic kidney disease: Secondary | ICD-10-CM | POA: Diagnosis not present

## 2020-10-28 DIAGNOSIS — Z992 Dependence on renal dialysis: Secondary | ICD-10-CM | POA: Diagnosis not present

## 2020-10-28 DIAGNOSIS — N2581 Secondary hyperparathyroidism of renal origin: Secondary | ICD-10-CM | POA: Diagnosis not present

## 2020-10-28 DIAGNOSIS — N186 End stage renal disease: Secondary | ICD-10-CM | POA: Diagnosis not present

## 2020-10-31 DIAGNOSIS — N2581 Secondary hyperparathyroidism of renal origin: Secondary | ICD-10-CM | POA: Diagnosis not present

## 2020-10-31 DIAGNOSIS — N186 End stage renal disease: Secondary | ICD-10-CM | POA: Diagnosis not present

## 2020-10-31 DIAGNOSIS — Z992 Dependence on renal dialysis: Secondary | ICD-10-CM | POA: Diagnosis not present

## 2020-11-01 ENCOUNTER — Ambulatory Visit (INDEPENDENT_AMBULATORY_CARE_PROVIDER_SITE_OTHER): Payer: Medicare PPO

## 2020-11-01 VITALS — Ht 71.0 in | Wt 203.0 lb

## 2020-11-01 DIAGNOSIS — H2513 Age-related nuclear cataract, bilateral: Secondary | ICD-10-CM | POA: Diagnosis not present

## 2020-11-01 DIAGNOSIS — Z0000 Encounter for general adult medical examination without abnormal findings: Secondary | ICD-10-CM | POA: Diagnosis not present

## 2020-11-01 NOTE — Patient Instructions (Signed)
[name] , Thank you for taking time to come for your Medicare Wellness Visit. I appreciate your ongoing commitment to your health goals. Please review the following plan we discussed and let me know if I can assist you in the future.   Screening recommendations/referrals: Colonoscopy: completed 01/12/2018 Recommended yearly ophthalmology/optometry visit for glaucoma screening and checkup Recommended yearly dental visit for hygiene and checkup  Vaccinations: Influenza vaccine: completed 10/18/2020 Pneumococcal vaccine: decline Tdap vaccine: due Shingles vaccine: discussed   Covid-19:  12/16/2019, 04/08/2019, 03/11/2019  Advanced directives: Advance directive discussed with you today.   Conditions/risks identified: none  Next appointment: Follow up in one year for your annual wellness visit.   Preventive Care 32 Years and Older, Male Preventive care refers to lifestyle choices and visits with your health care provider that can promote health and wellness. What does preventive care include? A yearly physical exam. This is also called an annual well check. Dental exams once or twice a year. Routine eye exams. Ask your health care provider how often you should have your eyes checked. Personal lifestyle choices, including: Daily care of your teeth and gums. Regular physical activity. Eating a healthy diet. Avoiding tobacco and drug use. Limiting alcohol use. Practicing safe sex. Taking low doses of aspirin every day. Taking vitamin and mineral supplements as recommended by your health care provider. What happens during an annual well check? The services and screenings done by your health care provider during your annual well check will depend on your age, overall health, lifestyle risk factors, and family history of disease. Counseling  Your health care provider may ask you questions about your: Alcohol use. Tobacco use. Drug use. Emotional well-being. Home and relationship  well-being. Sexual activity. Eating habits. History of falls. Memory and ability to understand (cognition). Work and work Statistician. Screening  You may have the following tests or measurements: Height, weight, and BMI. Blood pressure. Lipid and cholesterol levels. These may be checked every 5 years, or more frequently if you are over 14 years old. Skin check. Lung cancer screening. You may have this screening every year starting at age 76 if you have a 30-pack-year history of smoking and currently smoke or have quit within the past 15 years. Fecal occult blood test (FOBT) of the stool. You may have this test every year starting at age 42. Flexible sigmoidoscopy or colonoscopy. You may have a sigmoidoscopy every 5 years or a colonoscopy every 10 years starting at age 70. Prostate cancer screening. Recommendations will vary depending on your family history and other risks. Hepatitis C blood test. Hepatitis B blood test. Sexually transmitted disease (STD) testing. Diabetes screening. This is done by checking your blood sugar (glucose) after you have not eaten for a while (fasting). You may have this done every 1-3 years. Abdominal aortic aneurysm (AAA) screening. You may need this if you are a current or former smoker. Osteoporosis. You may be screened starting at age 61 if you are at high risk. Talk with your health care provider about your test results, treatment options, and if necessary, the need for more tests. Vaccines  Your health care provider may recommend certain vaccines, such as: Influenza vaccine. This is recommended every year. Tetanus, diphtheria, and acellular pertussis (Tdap, Td) vaccine. You may need a Td booster every 10 years. Zoster vaccine. You may need this after age 52. Pneumococcal 13-valent conjugate (PCV13) vaccine. One dose is recommended after age 71. Pneumococcal polysaccharide (PPSV23) vaccine. One dose is recommended after age 83. Talk  to your health care  provider about which screenings and vaccines you need and how often you need them. This information is not intended to replace advice given to you by your health care provider. Make sure you discuss any questions you have with your health care provider. Document Released: 02/10/2015 Document Revised: 10/04/2015 Document Reviewed: 11/15/2014 Elsevier Interactive Patient Education  2017 [place] Prevention in the Home Falls can cause injuries. They can happen to people of all ages. There are many things you can do to make your home safe and to help prevent falls. What can I do on the outside of my home? Regularly fix the edges of walkways and driveways and fix any cracks. Remove anything that might make you trip as you walk through a door, such as a raised step or threshold. Trim any bushes or trees on the path to your home. Use bright outdoor lighting. Clear any walking paths of anything that might make someone trip, such as rocks or tools. Regularly check to see if handrails are loose or broken. Make sure that both sides of any steps have handrails. Any raised decks and porches should have guardrails on the edges. Have any leaves, snow, or ice cleared regularly. Use sand or salt on walking paths during winter. Clean up any spills in your garage right away. This includes oil or grease spills. What can I do in the bathroom? Use night lights. Install grab bars by the toilet and in the tub and shower. Do not use towel bars as grab bars. Use non-skid mats or decals in the tub or shower. If you need to sit down in the shower, use a plastic, non-slip stool. Keep the floor dry. Clean up any water that spills on the floor as soon as it happens. Remove soap buildup in the tub or shower regularly. Attach bath mats securely with double-sided non-slip rug tape. Do not have throw rugs and other things on the floor that can make you trip. What can I do in the bedroom? Use night lights. Make  sure that you have a light by your bed that is easy to reach. Do not use any sheets or blankets that are too big for your bed. They should not hang down onto the floor. Have a firm chair that has side arms. You can use this for support while you get dressed. Do not have throw rugs and other things on the floor that can make you trip. What can I do in the kitchen? Clean up any spills right away. Avoid walking on wet floors. Keep items that you use a lot in easy-to-reach places. If you need to reach something above you, use a strong step stool that has a grab bar. Keep electrical cords out of the way. Do not use floor polish or wax that makes floors slippery. If you must use wax, use non-skid floor wax. Do not have throw rugs and other things on the floor that can make you trip. What can I do with my stairs? Do not leave any items on the stairs. Make sure that there are handrails on both sides of the stairs and use them. Fix handrails that are broken or loose. Make sure that handrails are as long as the stairways. Check any carpeting to make sure that it is firmly attached to the stairs. Fix any carpet that is loose or worn. Avoid having throw rugs at the top or bottom of the stairs. If you do have throw rugs,  attach them to the floor with carpet tape. Make sure that you have a light switch at the top of the stairs and the bottom of the stairs. If you do not have them, ask someone to add them for you. What else can I do to help prevent falls? Wear shoes that: Do not have high heels. Have rubber bottoms. Are comfortable and fit you well. Are closed at the toe. Do not wear sandals. If you use a stepladder: Make sure that it is fully opened. Do not climb a closed stepladder. Make sure that both sides of the stepladder are locked into place. Ask someone to hold it for you, if possible. Clearly mark and make sure that you can see: Any grab bars or handrails. First and last steps. Where the  edge of each step is. Use tools that help you move around (mobility aids) if they are needed. These include: Canes. Walkers. Scooters. Crutches. Turn on the lights when you go into a dark area. Replace any light bulbs as soon as they burn out. Set up your furniture so you have a clear path. Avoid moving your furniture around. If any of your floors are uneven, fix them. If there are any pets around you, be aware of where they are. Review your medicines with your doctor. Some medicines can make you feel dizzy. This can increase your chance of falling. Ask your doctor what other things that you can do to help prevent falls. This information is not intended to replace advice given to you by your health care provider. Make sure you discuss any questions you have with your health care provider. Document Released: 11/10/2008 Document Revised: 06/22/2015 Document Reviewed: 02/18/2014 Elsevier Interactive Patient Education  2017 [employer].

## 2020-11-01 NOTE — Progress Notes (Signed)
I connected with Javier Jenkins today by telephone and verified that I am speaking with the correct person using two identifiers. Location patient: home Location provider: work Persons participating in the virtual visit: Jaquan, Sadowsky LPN.   I discussed the limitations, risks, security and privacy concerns of performing an evaluation and management service by telephone and the availability of in person appointments. I also discussed with the patient that there may be a patient responsible charge related to this service. The patient expressed understanding and verbally consented to this telephonic visit.    Interactive audio and video telecommunications were attempted between this provider and patient, however failed, due to patient having technical difficulties OR patient did not have access to video capability.  We continued and completed visit with audio only.     Vital signs may be patient reported or missing.  Subjective:   Javier Jenkins is a 70 y.o. male who presents for Medicare Annual/Subsequent preventive examination.  Review of Systems     Cardiac Risk Factors include: advanced age (>46men, >50 women);hypertension;male gender     Objective:    Today's Vitals   11/01/20 1425  Weight: 203 lb (92.1 kg)  Height: 5\' 11"  (1.803 m)   Body mass index is 28.31 kg/m.  Advanced Directives 11/01/2020 08/19/2020 01/12/2020 11/24/2018 08/28/2018 08/19/2018 07/11/2018  Does Patient Have a Medical Advance Directive? No No No No No No No  Would patient like information on creating a medical advance directive? - - - - No - Patient declined No - Patient declined No - Patient declined    Current Medications (verified) Outpatient Encounter Medications as of 11/01/2020  Medication Sig   aspirin 325 MG tablet Take 1 tablet (325 mg total) by mouth daily.   atorvastatin (LIPITOR) 10 MG tablet TAKE 1 TABLET BY MOUTH EVERY DAY   ferric citrate (AURYXIA) 1 GM 210 MG(Fe)  tablet Take 420 mg by mouth 3 (three) times daily with meals.   levETIRAcetam (KEPPRA) 500 MG tablet TAKE 1 TABLET BY MOUTH TWICE A DAY   levothyroxine (SYNTHROID) 25 MCG tablet TAKE 1 TABLET BY MOUTH EVERY DAY   metoprolol tartrate (LOPRESSOR) 100 MG tablet TAKE 1 TABLET BY MOUTH TWICE A DAY   multivitamin (RENA-VIT) TABS tablet Take 1 tablet by mouth at bedtime.   cinacalcet (SENSIPAR) 30 MG tablet Take 30 mg by mouth daily.   No facility-administered encounter medications on file as of 11/01/2020.    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   Stroke ([place]) 1998   right side paralysis   Past Surgical History:  Procedure Laterality Date   AV FISTULA PLACEMENT Left 10/18/2014   Procedure: ARTERIOVENOUS (AV) FISTULA CREATION;  Surgeon: Angelia Mould, MD;  Location: [facility];  Service: Vascular;  Laterality: Left;   BURR HOLE Right 03/17/2015   Procedure: Trudee Kuster HOLES FOR RIGHT SUBDURAL HEMATOMA;  Surgeon: Leeroy Cha, MD;  Location: [facility];  Service: Neurosurgery;  Laterality: Right;   COLONOSCOPY     HEMORRHOID SURGERY     IR REMOVAL TUN  ACCESS W/ PORT W/O FL MOD SED  05/12/2017   REVISON OF ARTERIOVENOUS FISTULA Left 09/04/2018   Procedure: EXCISION OF ULCER ON LEFT ARM ARTERIOVENOUS FISTULA;  Surgeon: Angelia Mould, MD;  [facility];  Service: Vascular;  Laterality: Left;   Family History  Problem Relation Age of Onset   Hypertension Mother    Cancer Paternal Uncle        prostate ca   Social History   Socioeconomic History   Marital status: Married     Spouse name: Not on file   Number of children: Not on file   Years of education: Not on file   Highest education level: Not on file  Occupational History   Occupation: retired  Tobacco Use   Smoking status: Former    Types: Pipe    Quit date: 09/22/1984    Years since quitting: 36.1   Smokeless tobacco: Never  Vaping Use   Vaping Use: Never used  Substance and Sexual Activity   Alcohol use: No    Alcohol/week: 0.0 standard drinks   Drug use: No   Sexual activity: Yes    Comment: pastor, married, 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  Other Topics Concern   Not on file  Social History Narrative   Not on file   Social Determinants of Health   Financial Resource Strain: Low Risk    Difficulty of Paying Living Expenses: Not hard at all  Food Insecurity: No Food Insecurity   Worried About Charity fundraiser in the Last Year: Never true   [place] in the Last Year: Never true  Transportation Needs: No Transportation Needs   Lack of Transportation (Medical): No   Lack of Transportation (Non-Medical): No  Physical Activity: Insufficiently Active   Days of Exercise per Week: 3 days   Minutes of Exercise per Session: 30 min  Stress: No Stress Concern Present   Feeling of Stress : Not at all  Social Connections: Not on file    Tobacco Counseling Counseling given: Not Answered   Clinical Intake:  Pre-visit preparation completed: Yes  Pain : No/denies pain     Nutritional Status: BMI 25 -29 Overweight Nutritional Risks: None Diabetes: No  How often do you need to have someone help you when you read instructions, pamphlets, or other written materials from your doctor or pharmacy?: 1 - Never What is the last grade level you completed in school?: masters degree  Diabetic? no  Interpreter Needed?: No  Information entered by :: NAllen LPN   Activities of Daily Living In your present state of health, do you have any difficulty performing the following activities: 11/01/2020  01/12/2020  Hearing? N N  Vision? N N  Difficulty concentrating or making decisions? N N  Walking or climbing stairs? Y N  Comment - just takes time  Dressing or bathing? N N  Doing errands, shopping? N N  Preparing Food and eating ? N N  Using the Toilet? N N  In the past six months, have you accidently leaked urine? N N  Do you have problems with loss of bowel control? N N  Managing your Medications? N N  Managing your Finances? N N  Housekeeping or managing your Housekeeping? N N  Some recent data might be hidden    Patient Care Team: [name], FNP as PCP - General ([place]) [name], MD (Inactive) as Consulting Physician (Nephrology) Heath Lark, MD as Consulting Physician (Hematology and Oncology) Center,  [facility]  Indicate any recent Medical Services you may have received from other than [facility] providers in the past year (date may be approximate).     Assessment:   This is a routine wellness examination for Geofrey.  Hearing/Vision screen Vision Screening - Comments:: Regular eye exams, Dr. Katy Fitch  Dietary issues and exercise activities discussed: Current Exercise Habits: Home exercise routine, Type of exercise: walking, Time (Minutes): 30, Frequency (Times/Week): 3, Weekly Exercise (Minutes/Week): 90   Goals Addressed             This Visit's Progress    Patient Stated       11/01/2020, no goals       Depression Screen PHQ 2/9 Scores 11/01/2020 01/12/2020 04/05/2019 12/07/2018 11/24/2018 07/20/2018 02/20/2018  PHQ - 2 Score 0 0 0 0 0 0 0  PHQ- 9 Score - - - - 0 - -    Fall Risk Fall Risk  11/01/2020 01/12/2020 04/05/2019 12/07/2018 11/24/2018  Falls in the past year? 0 0 0 0 1  Comment - - - - tripped out the back door  Number falls in past yr: - - - - 1  Injury with Fall? - - - - 1  Comment - - - - fractured shoulder  Risk for fall due to : Medication side effect Impaired balance/gait;Medication side effect - - History of  fall(s);Impaired balance/gait;Medication side effect  Follow up Falls evaluation completed;Education provided;Falls prevention discussed Falls evaluation completed;Education provided;Falls prevention discussed - - Falls evaluation completed;Falls prevention discussed    FALL RISK PREVENTION PERTAINING TO THE HOME:  Any stairs in or around the home? Yes  If so, are there any without handrails? No  Home free of loose throw rugs in walkways, pet beds, electrical cords, etc? Yes  Adequate lighting in your home to reduce risk of falls? Yes   ASSISTIVE DEVICES UTILIZED TO PREVENT FALLS:  Life alert? No  Use of a cane, walker or w/c? Yes  Grab bars in the bathroom? Yes  Shower chair or bench in shower? Yes  Elevated toilet seat or a handicapped toilet? Yes   TIMED UP AND GO:  Was the test performed? No .       Cognitive Function:     6CIT Screen 11/01/2020 01/12/2020 11/24/2018  What Year? 0 points 0 points 0 points  What month? 0 points 0 points 0 points  What time? 0 points 0 points 0 points  Count back from 20 0 points 0 points 0 points  Months in reverse 0 points 0 points 0 points  Repeat phrase 0 points 0 points 0 points  Total Score 0 0 0    Immunizations Immunization History  Administered Date(s) Administered   Influenza, High Dose Seasonal PF 11/18/2016, 10/22/2018, 10/18/2020   Influenza,inj,Quad PF,6+ Mos 11/28/2015   Influenza-Unspecified 10/28/2019   Moderna Sars-Covid-2 Vaccination 03/11/2019, 04/08/2019, 12/16/2019    TDAP status: Due, Education has been provided regarding the importance of this vaccine. Advised may receive this vaccine at local pharmacy or Health Dept. Aware to provide a copy of the vaccination record if obtained from local pharmacy or Health Dept. Verbalized acceptance and understanding.  Flu Vaccine status: Up to date  Pneumococcal vaccine status: Declined,  Education has been provided regarding the importance of this vaccine but patient  still declined. Advised may receive this vaccine at local pharmacy or Health Dept. Aware to provide a copy of the vaccination record if obtained from local pharmacy or Health Dept. Verbalized acceptance and  understanding.   Covid-19 vaccine status: Completed vaccines  Qualifies for Shingles Vaccine? Yes   Zostavax completed No   Shingrix Completed?: No.    Education has been provided regarding the importance of this vaccine. Patient has been advised to call insurance company to determine out of pocket expense if they have not yet received this vaccine. Advised may also receive vaccine at local pharmacy or Health Dept. Verbalized acceptance and understanding.  Screening Tests Health Maintenance  Topic Date Due   COVID-19 Vaccine (4 - Booster for Moderna series) 03/09/2020   Zoster Vaccines- Shingrix (1 of 2) 11/14/2020 (Originally 05/19/1969)   TETANUS/TDAP  01/11/2021 (Originally 05/19/1969)   COLONOSCOPY (Pts 45-82yrs Insurance coverage will need to be confirmed)  01/13/2028   INFLUENZA VACCINE  Completed   Hepatitis C Screening  Completed   HPV VACCINES  Aged Out   URINE MICROALBUMIN  Discontinued    Health Maintenance  Health Maintenance Due  Topic Date Due   COVID-19 Vaccine (4 - Booster for Moderna series) 03/09/2020    Colorectal cancer screening: Type of screening: Colonoscopy. Completed 01/12/2018. Repeat every 5 years  Lung Cancer Screening: (Low Dose CT Chest recommended if Age 20-80 years, 30 pack-year currently smoking OR have quit w/in 15years.) does not qualify.   Lung Cancer Screening Referral: no  Additional Screening:  Hepatitis C Screening: does qualify; Completed 02/04/2019  Vision Screening: Recommended annual ophthalmology exams for early detection of glaucoma and other disorders of the eye. Is the patient up to date with their annual eye exam?  Yes  Who is the provider or what is the name of the office in which the patient attends annual eye exams? Dr.  Katy Fitch If pt is not established with a provider, would they like to be referred to a provider to establish care? No .   Dental Screening: Recommended annual dental exams for proper oral hygiene  Community Resource Referral / Chronic Care Management: CRR required this visit?  No   CCM required this visit?  No      Plan:     I have personally reviewed and noted the following in the patient's chart:   Medical and social history Use of alcohol, tobacco or illicit drugs  Current medications and supplements including opioid prescriptions. Patient is not currently taking opioid prescriptions. Functional ability and status Nutritional status Physical activity Advanced directives List of other physicians Hospitalizations, surgeries, and ER visits in previous 12 months Vitals Screenings to include cognitive, depression, and falls Referrals and appointments  In addition, I have reviewed and discussed with patient certain preventive protocols, quality metrics, and best practice recommendations. A written personalized care plan for preventive services as well as general preventive health recommendations were provided to patient.     Kellie Simmering, LPN   [id number]   Nurse Notes:

## 2020-11-02 DIAGNOSIS — N2581 Secondary hyperparathyroidism of renal origin: Secondary | ICD-10-CM | POA: Diagnosis not present

## 2020-11-02 DIAGNOSIS — N186 End stage renal disease: Secondary | ICD-10-CM | POA: Diagnosis not present

## 2020-11-02 DIAGNOSIS — Z992 Dependence on renal dialysis: Secondary | ICD-10-CM | POA: Diagnosis not present

## 2020-11-04 DIAGNOSIS — N186 End stage renal disease: Secondary | ICD-10-CM | POA: Diagnosis not present

## 2020-11-04 DIAGNOSIS — Z992 Dependence on renal dialysis: Secondary | ICD-10-CM | POA: Diagnosis not present

## 2020-11-04 DIAGNOSIS — N2581 Secondary hyperparathyroidism of renal origin: Secondary | ICD-10-CM | POA: Diagnosis not present

## 2020-11-07 DIAGNOSIS — N186 End stage renal disease: Secondary | ICD-10-CM | POA: Diagnosis not present

## 2020-11-07 DIAGNOSIS — N2581 Secondary hyperparathyroidism of renal origin: Secondary | ICD-10-CM | POA: Diagnosis not present

## 2020-11-07 DIAGNOSIS — Z992 Dependence on renal dialysis: Secondary | ICD-10-CM | POA: Diagnosis not present

## 2020-11-09 DIAGNOSIS — N186 End stage renal disease: Secondary | ICD-10-CM | POA: Diagnosis not present

## 2020-11-09 DIAGNOSIS — Z992 Dependence on renal dialysis: Secondary | ICD-10-CM | POA: Diagnosis not present

## 2020-11-09 DIAGNOSIS — N2581 Secondary hyperparathyroidism of renal origin: Secondary | ICD-10-CM | POA: Diagnosis not present

## 2020-11-11 DIAGNOSIS — N186 End stage renal disease: Secondary | ICD-10-CM | POA: Diagnosis not present

## 2020-11-11 DIAGNOSIS — Z992 Dependence on renal dialysis: Secondary | ICD-10-CM | POA: Diagnosis not present

## 2020-11-11 DIAGNOSIS — N2581 Secondary hyperparathyroidism of renal origin: Secondary | ICD-10-CM | POA: Diagnosis not present

## 2020-11-13 DIAGNOSIS — B351 Tinea unguium: Secondary | ICD-10-CM | POA: Diagnosis not present

## 2020-11-13 DIAGNOSIS — I739 Peripheral vascular disease, unspecified: Secondary | ICD-10-CM | POA: Diagnosis not present

## 2020-11-13 DIAGNOSIS — L603 Nail dystrophy: Secondary | ICD-10-CM | POA: Diagnosis not present

## 2020-11-13 DIAGNOSIS — I70203 Unspecified atherosclerosis of native arteries of extremities, bilateral legs: Secondary | ICD-10-CM | POA: Diagnosis not present

## 2020-11-14 DIAGNOSIS — N186 End stage renal disease: Secondary | ICD-10-CM | POA: Diagnosis not present

## 2020-11-14 DIAGNOSIS — N2581 Secondary hyperparathyroidism of renal origin: Secondary | ICD-10-CM | POA: Diagnosis not present

## 2020-11-14 DIAGNOSIS — Z992 Dependence on renal dialysis: Secondary | ICD-10-CM | POA: Diagnosis not present

## 2020-11-16 DIAGNOSIS — N186 End stage renal disease: Secondary | ICD-10-CM | POA: Diagnosis not present

## 2020-11-16 DIAGNOSIS — N2581 Secondary hyperparathyroidism of renal origin: Secondary | ICD-10-CM | POA: Diagnosis not present

## 2020-11-16 DIAGNOSIS — Z992 Dependence on renal dialysis: Secondary | ICD-10-CM | POA: Diagnosis not present

## 2020-11-18 DIAGNOSIS — N186 End stage renal disease: Secondary | ICD-10-CM | POA: Diagnosis not present

## 2020-11-18 DIAGNOSIS — Z992 Dependence on renal dialysis: Secondary | ICD-10-CM | POA: Diagnosis not present

## 2020-11-18 DIAGNOSIS — N2581 Secondary hyperparathyroidism of renal origin: Secondary | ICD-10-CM | POA: Diagnosis not present

## 2020-11-21 DIAGNOSIS — N2581 Secondary hyperparathyroidism of renal origin: Secondary | ICD-10-CM | POA: Diagnosis not present

## 2020-11-21 DIAGNOSIS — N186 End stage renal disease: Secondary | ICD-10-CM | POA: Diagnosis not present

## 2020-11-21 DIAGNOSIS — Z992 Dependence on renal dialysis: Secondary | ICD-10-CM | POA: Diagnosis not present

## 2020-11-23 DIAGNOSIS — Z992 Dependence on renal dialysis: Secondary | ICD-10-CM | POA: Diagnosis not present

## 2020-11-23 DIAGNOSIS — N2581 Secondary hyperparathyroidism of renal origin: Secondary | ICD-10-CM | POA: Diagnosis not present

## 2020-11-23 DIAGNOSIS — N186 End stage renal disease: Secondary | ICD-10-CM | POA: Diagnosis not present

## 2020-11-25 DIAGNOSIS — N2581 Secondary hyperparathyroidism of renal origin: Secondary | ICD-10-CM | POA: Diagnosis not present

## 2020-11-25 DIAGNOSIS — N186 End stage renal disease: Secondary | ICD-10-CM | POA: Diagnosis not present

## 2020-11-25 DIAGNOSIS — Z992 Dependence on renal dialysis: Secondary | ICD-10-CM | POA: Diagnosis not present

## 2020-11-27 DIAGNOSIS — I129 Hypertensive chronic kidney disease with stage 1 through stage 4 chronic kidney disease, or unspecified chronic kidney disease: Secondary | ICD-10-CM | POA: Diagnosis not present

## 2020-11-27 DIAGNOSIS — N186 End stage renal disease: Secondary | ICD-10-CM | POA: Diagnosis not present

## 2020-11-27 DIAGNOSIS — Z992 Dependence on renal dialysis: Secondary | ICD-10-CM | POA: Diagnosis not present

## 2020-11-30 DIAGNOSIS — Z992 Dependence on renal dialysis: Secondary | ICD-10-CM | POA: Diagnosis not present

## 2020-11-30 DIAGNOSIS — N2581 Secondary hyperparathyroidism of renal origin: Secondary | ICD-10-CM | POA: Diagnosis not present

## 2020-11-30 DIAGNOSIS — N186 End stage renal disease: Secondary | ICD-10-CM | POA: Diagnosis not present

## 2020-12-02 DIAGNOSIS — N186 End stage renal disease: Secondary | ICD-10-CM | POA: Diagnosis not present

## 2020-12-02 DIAGNOSIS — Z992 Dependence on renal dialysis: Secondary | ICD-10-CM | POA: Diagnosis not present

## 2020-12-02 DIAGNOSIS — N2581 Secondary hyperparathyroidism of renal origin: Secondary | ICD-10-CM | POA: Diagnosis not present

## 2020-12-05 DIAGNOSIS — N2581 Secondary hyperparathyroidism of renal origin: Secondary | ICD-10-CM | POA: Diagnosis not present

## 2020-12-05 DIAGNOSIS — Z992 Dependence on renal dialysis: Secondary | ICD-10-CM | POA: Diagnosis not present

## 2020-12-05 DIAGNOSIS — N186 End stage renal disease: Secondary | ICD-10-CM | POA: Diagnosis not present

## 2020-12-07 DIAGNOSIS — N186 End stage renal disease: Secondary | ICD-10-CM | POA: Diagnosis not present

## 2020-12-07 DIAGNOSIS — N2581 Secondary hyperparathyroidism of renal origin: Secondary | ICD-10-CM | POA: Diagnosis not present

## 2020-12-07 DIAGNOSIS — Z992 Dependence on renal dialysis: Secondary | ICD-10-CM | POA: Diagnosis not present

## 2020-12-09 DIAGNOSIS — N2581 Secondary hyperparathyroidism of renal origin: Secondary | ICD-10-CM | POA: Diagnosis not present

## 2020-12-09 DIAGNOSIS — Z992 Dependence on renal dialysis: Secondary | ICD-10-CM | POA: Diagnosis not present

## 2020-12-09 DIAGNOSIS — N186 End stage renal disease: Secondary | ICD-10-CM | POA: Diagnosis not present

## 2020-12-12 DIAGNOSIS — N186 End stage renal disease: Secondary | ICD-10-CM | POA: Diagnosis not present

## 2020-12-12 DIAGNOSIS — N2581 Secondary hyperparathyroidism of renal origin: Secondary | ICD-10-CM | POA: Diagnosis not present

## 2020-12-12 DIAGNOSIS — Z992 Dependence on renal dialysis: Secondary | ICD-10-CM | POA: Diagnosis not present

## 2020-12-14 DIAGNOSIS — Z992 Dependence on renal dialysis: Secondary | ICD-10-CM | POA: Diagnosis not present

## 2020-12-14 DIAGNOSIS — N186 End stage renal disease: Secondary | ICD-10-CM | POA: Diagnosis not present

## 2020-12-14 DIAGNOSIS — N2581 Secondary hyperparathyroidism of renal origin: Secondary | ICD-10-CM | POA: Diagnosis not present

## 2020-12-16 DIAGNOSIS — N186 End stage renal disease: Secondary | ICD-10-CM | POA: Diagnosis not present

## 2020-12-16 DIAGNOSIS — N2581 Secondary hyperparathyroidism of renal origin: Secondary | ICD-10-CM | POA: Diagnosis not present

## 2020-12-16 DIAGNOSIS — Z992 Dependence on renal dialysis: Secondary | ICD-10-CM | POA: Diagnosis not present

## 2020-12-19 DIAGNOSIS — N186 End stage renal disease: Secondary | ICD-10-CM | POA: Diagnosis not present

## 2020-12-19 DIAGNOSIS — Z992 Dependence on renal dialysis: Secondary | ICD-10-CM | POA: Diagnosis not present

## 2020-12-19 DIAGNOSIS — N2581 Secondary hyperparathyroidism of renal origin: Secondary | ICD-10-CM | POA: Diagnosis not present

## 2020-12-22 ENCOUNTER — Other Ambulatory Visit: Payer: Self-pay | Admitting: Nurse Practitioner

## 2020-12-22 DIAGNOSIS — N2581 Secondary hyperparathyroidism of renal origin: Secondary | ICD-10-CM | POA: Diagnosis not present

## 2020-12-22 DIAGNOSIS — Z992 Dependence on renal dialysis: Secondary | ICD-10-CM | POA: Diagnosis not present

## 2020-12-22 DIAGNOSIS — N186 End stage renal disease: Secondary | ICD-10-CM | POA: Diagnosis not present

## 2020-12-24 DIAGNOSIS — Z992 Dependence on renal dialysis: Secondary | ICD-10-CM | POA: Diagnosis not present

## 2020-12-24 DIAGNOSIS — N2581 Secondary hyperparathyroidism of renal origin: Secondary | ICD-10-CM | POA: Diagnosis not present

## 2020-12-24 DIAGNOSIS — N186 End stage renal disease: Secondary | ICD-10-CM | POA: Diagnosis not present

## 2020-12-26 DIAGNOSIS — N186 End stage renal disease: Secondary | ICD-10-CM | POA: Diagnosis not present

## 2020-12-26 DIAGNOSIS — N2581 Secondary hyperparathyroidism of renal origin: Secondary | ICD-10-CM | POA: Diagnosis not present

## 2020-12-26 DIAGNOSIS — Z992 Dependence on renal dialysis: Secondary | ICD-10-CM | POA: Diagnosis not present

## 2020-12-27 DIAGNOSIS — Z992 Dependence on renal dialysis: Secondary | ICD-10-CM | POA: Diagnosis not present

## 2020-12-27 DIAGNOSIS — I129 Hypertensive chronic kidney disease with stage 1 through stage 4 chronic kidney disease, or unspecified chronic kidney disease: Secondary | ICD-10-CM | POA: Diagnosis not present

## 2020-12-27 DIAGNOSIS — N186 End stage renal disease: Secondary | ICD-10-CM | POA: Diagnosis not present

## 2020-12-28 DIAGNOSIS — N2581 Secondary hyperparathyroidism of renal origin: Secondary | ICD-10-CM | POA: Diagnosis not present

## 2020-12-28 DIAGNOSIS — Z992 Dependence on renal dialysis: Secondary | ICD-10-CM | POA: Diagnosis not present

## 2020-12-28 DIAGNOSIS — N186 End stage renal disease: Secondary | ICD-10-CM | POA: Diagnosis not present

## 2020-12-29 ENCOUNTER — Encounter (HOSPITAL_COMMUNITY): Payer: Self-pay | Admitting: Vascular Surgery

## 2020-12-29 ENCOUNTER — Ambulatory Visit (INDEPENDENT_AMBULATORY_CARE_PROVIDER_SITE_OTHER): Payer: Medicare PPO | Admitting: Physician Assistant

## 2020-12-29 ENCOUNTER — Other Ambulatory Visit: Payer: Self-pay

## 2020-12-29 VITALS — BP 111/77 | HR 71 | Temp 97.8°F | Resp 20 | Ht 71.0 in | Wt 203.0 lb

## 2020-12-29 DIAGNOSIS — I770 Arteriovenous fistula, acquired: Secondary | ICD-10-CM | POA: Diagnosis not present

## 2020-12-29 NOTE — H&P (View-Only) (Signed)
[facility] [place] HISTORY AND PHYSICAL   History of Present Illness:  Patient is a 70 y.o. year old male who presents for permanent hemodialysis access evaluation.  He has a non healing ulcer over the proximal portion of his left BC av fistula.    He has had previous ulcer excision by Dr. Scot Dock on 09/04/18.  He has had the fistula for > 5 years now.  He denise fever and chills or prolonged bleeding episodes.  He is on HD TTS schedule.    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   Stroke ([place]) 1998   right side paralysis    Past Surgical History:  Procedure Laterality Date   AV FISTULA PLACEMENT Left 10/18/2014   Procedure: ARTERIOVENOUS (AV) FISTULA CREATION;  Surgeon: Angelia Mould, MD;  Location: [facility];  Service: Vascular;  Laterality: Left;   BURR HOLE Right 03/17/2015   Procedure: Trudee Kuster HOLES FOR RIGHT SUBDURAL HEMATOMA;  Surgeon: Leeroy Cha, MD;  Location: [facility];  Service: Neurosurgery;  Laterality: Right;   COLONOSCOPY     HEMORRHOID SURGERY     IR REMOVAL TUN ACCESS W/ PORT W/O FL MOD SED  05/12/2017   REVISON OF ARTERIOVENOUS FISTULA Left 09/04/2018   Procedure: EXCISION OF ULCER ON LEFT ARM ARTERIOVENOUS FISTULA;  Surgeon: Angelia Mould, MD;  Location: [facility] OR;  Service: Vascular;  Laterality: Left;     Social History Social History   Tobacco Use   Smoking status: Former    Types: Pipe    Quit date: 09/22/1984    Years since quitting: 36.2   Smokeless tobacco: Never  Vaping Use   Vaping Use: Never used  Substance Use Topics   Alcohol use: No    Alcohol/week:  0.0 standard drinks   Drug use: No    Family History Family History  Problem Relation Age of Onset   Hypertension Mother    Cancer Paternal Uncle        prostate ca    Allergies  Allergies  Allergen Reactions   Tape Hives    Paper tape and adhesives Other reaction(s): Hives     Current Outpatient Medications  Medication Sig Dispense Refill   aspirin 325 MG tablet Take 1 tablet (325 mg total) by mouth daily. 30 tablet 0   atorvastatin (LIPITOR) 10 MG tablet TAKE 1 TABLET BY MOUTH EVERY DAY 90 tablet 1   cinacalcet (SENSIPAR) 30 MG tablet Take 30 mg by mouth daily.     ferric citrate (AURYXIA) 1 GM 210 MG(Fe) tablet Take 420 mg by mouth 3 (three) times daily with meals.     levETIRAcetam (KEPPRA) 500 MG tablet TAKE 1 TABLET BY MOUTH TWICE A DAY 180 tablet 1   levothyroxine (SYNTHROID) 25 MCG tablet TAKE 1 TABLET BY MOUTH EVERY DAY 90 tablet 1   metoprolol tartrate (LOPRESSOR) 100 MG tablet TAKE 1 TABLET BY MOUTH TWICE A DAY 180 tablet 0   multivitamin (RENA-VIT) TABS tablet Take 1 tablet by mouth at bedtime. 90 tablet 0   No current facility-administered medications for this  visit.    ROS:   General:  No weight loss, Fever, chills  HEENT: No recent headaches, no nasal bleeding, no visual changes, no sore throat  Neurologic: No dizziness, blackouts, seizures. No recent symptoms of stroke or mini- stroke. No recent episodes of slurred speech, or temporary blindness.  Cardiac: No recent episodes of chest pain/pressure, no shortness of breath at rest.  No shortness of breath with exertion.  Denies history of atrial fibrillation or irregular heartbeat  Vascular: No history of rest pain in feet.  No history of claudication.  No history of non-healing ulcer, No history of DVT   Pulmonary: No home oxygen, no productive cough, no hemoptysis,  No asthma or wheezing  Musculoskeletal:  [ ]  Arthritis, [ ]  Low back pain,  [ ]  Joint pain  Hematologic:No history of hypercoagulable  state.  No history of easy bleeding.  No history of anemia  Gastrointestinal: No hematochezia or melena,  No gastroesophageal reflux, no trouble swallowing  Urinary: [ ]  chronic Kidney disease, [ x] on HD - [ ]  MWF or [ x] TTHS, [ ]  Burning with urination, [ ]  Frequent urination, [ ]  Difficulty urinating;   Skin: No rashes  Psychological: No history of anxiety,  No history of depression   Physical Examination  Vitals:   12/29/20 1024  BP: 111/77  Pulse: 71  Resp: 20  Temp: 97.8 F (36.6 C)  SpO2: 96%  Weight: 203 lb (92.1 kg)  Height: 5\' 11"  (1.803 m)    Body mass index is 28.31 kg/m.  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  We will schedule him for plication of the ulcerated area.  He has an aneurysmal area distally that is not adhered, no erythema and has been present for > 2 years.  I advised him to first have the ulcer area taken care of and them if we need to we will take care of the aneurysmal area.     PLAN: Plication/revision of BC av fistula with possible TDC placement.  Dec. 12th with DR. Scot Dock.  If he has problems before this date he picked he can call our office.  Do not stick ulcer area.   Roxy Horseman PA-C [facility] [place] Office: [phone]  MD on call Scot Dock

## 2020-12-29 NOTE — Progress Notes (Addendum)
Javier Jenkins denies chest pain or shortness of breath. Patient denies having any s/s of Covid in his household.  Patient denies any known exposure to Covid.   Javier Jenkins states he may have had pre- dialysi  but never diabetes and has never been on medication for diabetes, patient does not have a CBG machine.  I instructed Javier Jenkins  to shower with antibiotic soap, if it is available.  Dry off with a clean towel. Do not put lotion, powder, cologne or deodorant or makeup.No jewelry or piercings. Men may shave their face and neck. Woman should not shave. No nail polish, artificial or acrylic nails. Wear clean clothes, brush your teeth. Glasses, contact lens,dentures or partials may not be worn in the OR. If you need to wear them, please bring a case for glasses, do not wear contacts or bring a case, the hospital does not have contact cases, dentures or partials will have to be removed , make sure they are clean, we will provide a denture cup to put them in. You will need some one to drive you home and a responsible person over the age of 82 to stay with you for the first 24 hours after surgery.   PCP is Dr. Bryon Lions.

## 2020-12-29 NOTE — Progress Notes (Signed)
[facility] [place] HISTORY AND PHYSICAL   History of Present Illness:  Patient is a 70 y.o. year old male who presents for permanent hemodialysis access evaluation.  He has a non healing ulcer over the proximal portion of his left BC av fistula.    He has had previous ulcer excision by Dr. Scot Dock on 09/04/18.  He has had the fistula for > 5 years now.  He denise fever and chills or prolonged bleeding episodes.  He is on HD TTS schedule.    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   Stroke ([place]) 1998   right side paralysis    Past Surgical History:  Procedure Laterality Date   AV FISTULA PLACEMENT Left 10/18/2014   Procedure: ARTERIOVENOUS (AV) FISTULA CREATION;  Surgeon: Angelia Mould, MD;  Location: [facility];  Service: Vascular;  Laterality: Left;   BURR HOLE Right 03/17/2015   Procedure: Trudee Kuster HOLES FOR RIGHT SUBDURAL HEMATOMA;  Surgeon: Leeroy Cha, MD;  [facility];  Service: Neurosurgery;  Laterality: Right;   COLONOSCOPY     HEMORRHOID SURGERY     IR REMOVAL TUN ACCESS W/ PORT W/O FL MOD SED  05/12/2017   REVISON OF ARTERIOVENOUS FISTULA Left 09/04/2018   Procedure: EXCISION OF ULCER ON LEFT ARM ARTERIOVENOUS FISTULA;  Surgeon: Angelia Mould, MD;  Location: [facility];  Service: Vascular;  Laterality: Left;     Social History Social History   Tobacco Use   Smoking status: Former    Types: Pipe    Quit date: 09/22/1984    Years since quitting: 36.2   Smokeless tobacco: Never  Vaping Use   Vaping Use: Never used  Substance Use Topics   Alcohol use: No    Alcohol/week:  0.0 standard drinks   Drug use: No    Family History Family History  Problem Relation Age of Onset   Hypertension Mother    Cancer Paternal Uncle        prostate ca    Allergies  Allergies  Allergen Reactions   Tape Hives    Paper tape and adhesives Other reaction(s): Hives     Current Outpatient Medications  Medication Sig Dispense Refill   aspirin 325 MG tablet Take 1 tablet (325 mg total) by mouth daily. 30 tablet 0   atorvastatin (LIPITOR) 10 MG tablet TAKE 1 TABLET BY MOUTH EVERY DAY 90 tablet 1   cinacalcet (SENSIPAR) 30 MG tablet Take 30 mg by mouth daily.     ferric citrate (AURYXIA) 1 GM 210 MG(Fe) tablet Take 420 mg by mouth 3 (three) times daily with meals.     levETIRAcetam (KEPPRA) 500 MG tablet TAKE 1 TABLET BY MOUTH TWICE A DAY 180 tablet 1   levothyroxine (SYNTHROID) 25 MCG tablet TAKE 1 TABLET BY MOUTH EVERY DAY 90 tablet 1   metoprolol tartrate (LOPRESSOR) 100 MG tablet TAKE 1 TABLET BY MOUTH TWICE A DAY 180 tablet 0   multivitamin (RENA-VIT) TABS tablet Take 1 tablet by mouth at bedtime. 90 tablet 0   No current facility-administered medications for this  visit.    ROS:   General:  No weight loss, Fever, chills  HEENT: No recent headaches, no nasal bleeding, no visual changes, no sore throat  Neurologic: No dizziness, blackouts, seizures. No recent symptoms of stroke or mini- stroke. No recent episodes of slurred speech, or temporary blindness.  Cardiac: No recent episodes of chest pain/pressure, no shortness of breath at rest.  No shortness of breath with exertion.  Denies history of atrial fibrillation or irregular heartbeat  Vascular: No history of rest pain in feet.  No history of claudication.  No history of non-healing ulcer, No history of DVT   Pulmonary: No home oxygen, no productive cough, no hemoptysis,  No asthma or wheezing  Musculoskeletal:  [ ]  Arthritis, [ ]  Low back pain,  [ ]  Joint pain  Hematologic:No history of hypercoagulable  state.  No history of easy bleeding.  No history of anemia  Gastrointestinal: No hematochezia or melena,  No gastroesophageal reflux, no trouble swallowing  Urinary: [ ]  chronic Kidney disease, [ x] on HD - [ ]  MWF or [ x] TTHS, [ ]  Burning with urination, [ ]  Frequent urination, [ ]  Difficulty urinating;   Skin: No rashes  Psychological: No history of anxiety,  No history of depression   Physical Examination  Vitals:   12/29/20 1024  BP: 111/77  Pulse: 71  Resp: 20  Temp: 97.8 F (36.6 C)  SpO2: 96%  Weight: 203 lb (92.1 kg)  Height: 5\' 11"  (1.803 m)    Body mass index is 28.31 kg/m.  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  We will schedule him for plication of the ulcerated area.  He has an aneurysmal area distally that is not adhered, no erythema and has been present for > 2 years.  I advised him to first have the ulcer area taken care of and them if we need to we will take care of the aneurysmal area.     PLAN: Plication/revision of BC av fistula with possible TDC placement.  Dec. 12th with DR. Scot Dock.  If he has problems before this date he picked he can call our office.  Do not stick ulcer area.   Roxy Horseman PA-C [facility] [place] Office: [phone]  MD on call Scot Dock

## 2020-12-30 DIAGNOSIS — N186 End stage renal disease: Secondary | ICD-10-CM | POA: Diagnosis not present

## 2020-12-30 DIAGNOSIS — Z992 Dependence on renal dialysis: Secondary | ICD-10-CM | POA: Diagnosis not present

## 2020-12-30 DIAGNOSIS — N2581 Secondary hyperparathyroidism of renal origin: Secondary | ICD-10-CM | POA: Diagnosis not present

## 2020-12-31 NOTE — Anesthesia Preprocedure Evaluation (Addendum)
Anesthesia Evaluation  Patient identified by MRN, date of birth, ID band Patient awake    Reviewed: Allergy & Precautions, NPO status , Patient's Chart, lab work & pertinent test results, reviewed documented beta blocker date and time   History of Anesthesia Complications Negative for: history of anesthetic complications  Airway Mallampati: II  TM Distance: >3 FB Neck ROM: Full    Dental  (+) Edentulous Upper, Edentulous Lower   Pulmonary former smoker,    Pulmonary exam normal        Cardiovascular hypertension, Pt. on home beta blockers and Pt. on medications Normal cardiovascular exam   '21 Myocardial Perfusion - No evidence of any significant ischemia or scar  Left ventricular systolic function is normal. Post stress the ejection fraction is > 60%.  Mild coronary calcifications are noted     Neuro/Psych Seizures -, Well Controlled,  CVA, Residual Symptoms negative psych ROS   GI/Hepatic negative GI ROS, Neg liver ROS,   Endo/Other  diabetes, Well Controlled, Type 2Hypothyroidism  Pre-DM   Renal/GU ESRF and DialysisRenal disease     Musculoskeletal negative musculoskeletal ROS (+)   Abdominal   Peds  Hematology  (+) anemia ,  NHL    Anesthesia Other Findings   Reproductive/Obstetrics                            Anesthesia Physical Anesthesia Plan  ASA: 3  Anesthesia Plan: General   Post-op Pain Management: Tylenol PO (pre-op) and Minimal or no pain anticipated   Induction: Intravenous  PONV Risk Score and Plan: 2 and Treatment may vary due to age or medical condition, Ondansetron and Dexamethasone  Airway Management Planned: LMA  Additional Equipment: None  Intra-op Plan:   Post-operative Plan: Extubation in OR  Informed Consent: I have reviewed the patients History and Physical, chart, labs and discussed the procedure including the risks, benefits and alternatives for  the proposed anesthesia with the patient or authorized representative who has indicated his/her understanding and acceptance.     Dental advisory given  Plan Discussed with: CRNA and Anesthesiologist  Anesthesia Plan Comments:        Anesthesia Quick Evaluation

## 2021-01-01 ENCOUNTER — Ambulatory Visit (HOSPITAL_COMMUNITY): Payer: Medicare PPO

## 2021-01-01 ENCOUNTER — Encounter (HOSPITAL_COMMUNITY): Payer: Self-pay | Admitting: Vascular Surgery

## 2021-01-01 ENCOUNTER — Ambulatory Visit (HOSPITAL_COMMUNITY): Payer: Medicare PPO | Admitting: Anesthesiology

## 2021-01-01 ENCOUNTER — Encounter (HOSPITAL_COMMUNITY)
Admission: RE | Disposition: A | Discharge status: Home / Self Care | Payer: Self-pay | Source: Home / Self Care | Attending: Vascular Surgery

## 2021-01-01 ENCOUNTER — Other Ambulatory Visit: Payer: Self-pay

## 2021-01-01 ENCOUNTER — Ambulatory Visit (HOSPITAL_COMMUNITY)
Admission: RE | Admit: 2021-01-01 | Discharge: 2021-01-01 | Disposition: A | Discharge status: Home / Self Care | Payer: Medicare PPO | Attending: Vascular Surgery | Admitting: Vascular Surgery

## 2021-01-01 DIAGNOSIS — Z79899 Other long term (current) drug therapy: Secondary | ICD-10-CM | POA: Diagnosis not present

## 2021-01-01 DIAGNOSIS — T82510A Breakdown (mechanical) of surgically created arteriovenous fistula, initial encounter: Secondary | ICD-10-CM | POA: Diagnosis not present

## 2021-01-01 DIAGNOSIS — R234 Changes in skin texture: Secondary | ICD-10-CM | POA: Diagnosis not present

## 2021-01-01 DIAGNOSIS — T82898A Other specified complication of vascular prosthetic devices, implants and grafts, initial encounter: Secondary | ICD-10-CM | POA: Diagnosis not present

## 2021-01-01 DIAGNOSIS — Z992 Dependence on renal dialysis: Secondary | ICD-10-CM | POA: Insufficient documentation

## 2021-01-01 DIAGNOSIS — Z8572 Personal history of non-Hodgkin lymphomas: Secondary | ICD-10-CM | POA: Diagnosis not present

## 2021-01-01 DIAGNOSIS — N186 End stage renal disease: Secondary | ICD-10-CM | POA: Insufficient documentation

## 2021-01-01 DIAGNOSIS — I120 Hypertensive chronic kidney disease with stage 5 chronic kidney disease or end stage renal disease: Secondary | ICD-10-CM | POA: Insufficient documentation

## 2021-01-01 DIAGNOSIS — D631 Anemia in chronic kidney disease: Secondary | ICD-10-CM | POA: Diagnosis not present

## 2021-01-01 DIAGNOSIS — X58XXXA Exposure to other specified factors, initial encounter: Secondary | ICD-10-CM | POA: Diagnosis not present

## 2021-01-01 DIAGNOSIS — Z87891 Personal history of nicotine dependence: Secondary | ICD-10-CM | POA: Diagnosis not present

## 2021-01-01 DIAGNOSIS — E1122 Type 2 diabetes mellitus with diabetic chronic kidney disease: Secondary | ICD-10-CM | POA: Diagnosis not present

## 2021-01-01 DIAGNOSIS — Z419 Encounter for procedure for purposes other than remedying health state, unspecified: Secondary | ICD-10-CM

## 2021-01-01 HISTORY — DX: Unspecified injury of head, initial encounter: S09.90XA

## 2021-01-01 HISTORY — DX: Paralytic syndrome, unspecified: G83.9

## 2021-01-01 HISTORY — DX: Unspecified convulsions: R56.9

## 2021-01-01 HISTORY — DX: End stage renal disease: N18.6

## 2021-01-01 HISTORY — DX: Personal history of other medical treatment: Z92.89

## 2021-01-01 HISTORY — PX: FISTULA SUPERFICIALIZATION: SHX6341

## 2021-01-01 LAB — GLUCOSE, CAPILLARY
Glucose-Capillary: 105 mg/dL — ABNORMAL HIGH (ref 70–99)
Glucose-Capillary: 111 mg/dL — ABNORMAL HIGH (ref 70–99)

## 2021-01-01 LAB — POCT I-STAT, CHEM 8
BUN: 38 mg/dL — ABNORMAL HIGH (ref 8–23)
Calcium, Ion: 0.86 mmol/L — CL (ref 1.15–1.40)
Chloride: 97 mmol/L — ABNORMAL LOW (ref 98–111)
Creatinine, Ser: 10.6 mg/dL — ABNORMAL HIGH (ref 0.61–1.24)
Glucose, Bld: 104 mg/dL — ABNORMAL HIGH (ref 70–99)
HCT: 40 % (ref 39.0–52.0)
Hemoglobin: 13.6 g/dL (ref 13.0–17.0)
Potassium: 4.9 mmol/L (ref 3.5–5.1)
Sodium: 136 mmol/L (ref 135–145)
TCO2: 30 mmol/L (ref 22–32)

## 2021-01-01 SURGERY — FISTULA SUPERFICIALIZATION
Anesthesia: General | Site: Arm Upper

## 2021-01-01 MED ORDER — PHENYLEPHRINE HCL-NACL 20-0.9 MG/250ML-% IV SOLN
INTRAVENOUS | Status: DC | PRN
  Administered 2021-01-01: 50 ug/min via INTRAVENOUS

## 2021-01-01 MED ORDER — SODIUM CHLORIDE 0.9 % IV SOLN: INTRAVENOUS | Status: DC

## 2021-01-01 MED ORDER — HEPARIN SODIUM (PORCINE) 1000 UNIT/ML IJ SOLN
INTRAMUSCULAR | Status: DC | PRN
  Administered 2021-01-01: 9000 [IU] via INTRAVENOUS

## 2021-01-01 MED ORDER — LIDOCAINE 2% (20 MG/ML) 5 ML SYRINGE
INTRAMUSCULAR | Status: AC
  Filled 2021-01-01: qty 5

## 2021-01-01 MED ORDER — LIDOCAINE-EPINEPHRINE (PF) 1 %-1:200000 IJ SOLN
INTRAMUSCULAR | Status: DC | PRN
  Administered 2021-01-01: 18 mL

## 2021-01-01 MED ORDER — PAPAVERINE HCL 30 MG/ML IJ SOLN
INTRAMUSCULAR | Status: AC
  Filled 2021-01-01: qty 2

## 2021-01-01 MED ORDER — CHLORHEXIDINE GLUCONATE 0.12 % MT SOLN
15.0000 mL | Freq: Once | OROMUCOSAL | Status: AC
  Administered 2021-01-01: 15 mL via OROMUCOSAL
  Filled 2021-01-01: qty 15

## 2021-01-01 MED ORDER — FENTANYL CITRATE (PF) 250 MCG/5ML IJ SOLN
INTRAMUSCULAR | Status: DC | PRN
  Administered 2021-01-01: 25 ug via INTRAVENOUS
  Administered 2021-01-01: 50 ug via INTRAVENOUS

## 2021-01-01 MED ORDER — CEFAZOLIN SODIUM-DEXTROSE 2-4 GM/100ML-% IV SOLN
2.0000 g | INTRAVENOUS | Status: AC
  Administered 2021-01-01: 2 g via INTRAVENOUS
  Filled 2021-01-01: qty 100

## 2021-01-01 MED ORDER — PROTAMINE SULFATE 10 MG/ML IV SOLN
INTRAVENOUS | Status: DC | PRN
  Administered 2021-01-01: 50 mg via INTRAVENOUS

## 2021-01-01 MED ORDER — EPHEDRINE SULFATE-NACL 50-0.9 MG/10ML-% IV SOSY
PREFILLED_SYRINGE | INTRAVENOUS | Status: DC | PRN
  Administered 2021-01-01: 10 mg via INTRAVENOUS

## 2021-01-01 MED ORDER — LIDOCAINE 2% (20 MG/ML) 5 ML SYRINGE
INTRAMUSCULAR | Status: DC | PRN
  Administered 2021-01-01: 60 mg via INTRAVENOUS

## 2021-01-01 MED ORDER — OXYCODONE HCL 5 MG PO TABS: 5.0000 mg | ORAL_TABLET | Freq: Once | ORAL | Status: DC | PRN

## 2021-01-01 MED ORDER — HEPARIN SODIUM (PORCINE) 1000 UNIT/ML IJ SOLN
INTRAMUSCULAR | Status: AC
  Filled 2021-01-01: qty 10

## 2021-01-01 MED ORDER — HEPARIN 6000 UNIT IRRIGATION SOLUTION
Status: DC | PRN
  Administered 2021-01-01: 1

## 2021-01-01 MED ORDER — GLYCOPYRROLATE PF 0.2 MG/ML IJ SOSY
PREFILLED_SYRINGE | INTRAMUSCULAR | Status: AC
  Filled 2021-01-01: qty 1

## 2021-01-01 MED ORDER — GLYCOPYRROLATE 0.2 MG/ML IJ SOLN
INTRAMUSCULAR | Status: DC | PRN
  Administered 2021-01-01: .2 mg via INTRAVENOUS

## 2021-01-01 MED ORDER — CHLORHEXIDINE GLUCONATE 4 % EX LIQD: 60.0000 mL | Freq: Once | CUTANEOUS | Status: DC

## 2021-01-01 MED ORDER — FENTANYL CITRATE (PF) 250 MCG/5ML IJ SOLN
INTRAMUSCULAR | Status: AC
  Filled 2021-01-01: qty 5

## 2021-01-01 MED ORDER — ORAL CARE MOUTH RINSE: 15.0000 mL | Freq: Once | OROMUCOSAL | Status: AC

## 2021-01-01 MED ORDER — ONDANSETRON HCL 4 MG/2ML IJ SOLN
INTRAMUSCULAR | Status: DC | PRN
  Administered 2021-01-01: 4 mg via INTRAVENOUS

## 2021-01-01 MED ORDER — 0.9 % SODIUM CHLORIDE (POUR BTL) OPTIME
TOPICAL | Status: DC | PRN
  Administered 2021-01-01: 1000 mL

## 2021-01-01 MED ORDER — FENTANYL CITRATE (PF) 100 MCG/2ML IJ SOLN: 25.0000 ug | INTRAMUSCULAR | Status: DC | PRN

## 2021-01-01 MED ORDER — METOPROLOL TARTRATE 50 MG PO TABS
50.0000 mg | ORAL_TABLET | Freq: Once | ORAL | Status: AC
  Administered 2021-01-01: 50 mg via ORAL
  Filled 2021-01-01: qty 1

## 2021-01-01 MED ORDER — LIDOCAINE-EPINEPHRINE (PF) 1 %-1:200000 IJ SOLN
INTRAMUSCULAR | Status: AC
  Filled 2021-01-01: qty 30

## 2021-01-01 MED ORDER — ACETAMINOPHEN 500 MG PO TABS
1000.0000 mg | ORAL_TABLET | Freq: Once | ORAL | Status: AC
  Administered 2021-01-01: 1000 mg via ORAL
  Filled 2021-01-01: qty 2

## 2021-01-01 MED ORDER — HEPARIN 6000 UNIT IRRIGATION SOLUTION
Status: AC
  Filled 2021-01-01: qty 500

## 2021-01-01 MED ORDER — ONDANSETRON HCL 4 MG/2ML IJ SOLN
INTRAMUSCULAR | Status: AC
  Filled 2021-01-01: qty 2

## 2021-01-01 MED ORDER — OXYCODONE HCL 5 MG/5ML PO SOLN: 5.0000 mg | Freq: Once | ORAL | Status: DC | PRN

## 2021-01-01 MED ORDER — PHENYLEPHRINE 40 MCG/ML (10ML) SYRINGE FOR IV PUSH (FOR BLOOD PRESSURE SUPPORT)
PREFILLED_SYRINGE | INTRAVENOUS | Status: DC | PRN
  Administered 2021-01-01 (×2): 80 ug via INTRAVENOUS
  Administered 2021-01-01: 40 ug via INTRAVENOUS
  Administered 2021-01-01: 160 ug via INTRAVENOUS

## 2021-01-01 MED ORDER — ONDANSETRON HCL 4 MG/2ML IJ SOLN: 4.0000 mg | Freq: Once | INTRAMUSCULAR | Status: DC | PRN

## 2021-01-01 MED ORDER — PROPOFOL 10 MG/ML IV BOLUS
INTRAVENOUS | Status: AC
  Filled 2021-01-01: qty 20

## 2021-01-01 MED ORDER — PROPOFOL 10 MG/ML IV BOLUS
INTRAVENOUS | Status: DC | PRN
  Administered 2021-01-01: 120 mg via INTRAVENOUS

## 2021-01-01 MED ORDER — PHENYLEPHRINE 40 MCG/ML (10ML) SYRINGE FOR IV PUSH (FOR BLOOD PRESSURE SUPPORT)
PREFILLED_SYRINGE | INTRAVENOUS | Status: AC
  Filled 2021-01-01: qty 10

## 2021-01-01 MED ORDER — OXYCODONE HCL 5 MG PO TABS: 5.0000 mg | ORAL_TABLET | ORAL | 0 refills | Status: DC | PRN

## 2021-01-01 SURGICAL SUPPLY — 58 items
ARMBAND PINK RESTRICT EXTREMIT (MISCELLANEOUS) ×3 IMPLANT
BAG COUNTER SPONGE SURGICOUNT (BAG) ×3 IMPLANT
BAG DECANTER FOR FLEXI CONT (MISCELLANEOUS) IMPLANT
BIOPATCH RED 1 DISK 7.0 (GAUZE/BANDAGES/DRESSINGS) IMPLANT
BNDG ESMARK 4X9 LF (GAUZE/BANDAGES/DRESSINGS) ×3 IMPLANT
CANISTER SUCT 3000ML PPV (MISCELLANEOUS) ×3 IMPLANT
CANNULA VESSEL 3MM 2 BLNT TIP (CANNULA) ×3 IMPLANT
CATH PALINDROME-P 19CM W/VT (CATHETERS) IMPLANT
CATH PALINDROME-P 23CM W/VT (CATHETERS) IMPLANT
CATH PALINDROME-P 28CM W/VT (CATHETERS) IMPLANT
CHLORAPREP W/TINT 26 (MISCELLANEOUS) IMPLANT
CLIP VESOCCLUDE MED 6/CT (CLIP) ×3 IMPLANT
CLIP VESOCCLUDE SM WIDE 6/CT (CLIP) ×3 IMPLANT
COVER PROBE W GEL 5X96 (DRAPES) IMPLANT
COVER SURGICAL LIGHT HANDLE (MISCELLANEOUS) IMPLANT
CUFF TOURN SGL QUICK 18X4 (TOURNIQUET CUFF) ×3 IMPLANT
DECANTER SPIKE VIAL GLASS SM (MISCELLANEOUS) IMPLANT
DERMABOND ADVANCED (GAUZE/BANDAGES/DRESSINGS) ×1
DERMABOND ADVANCED .7 DNX12 (GAUZE/BANDAGES/DRESSINGS) ×2 IMPLANT
DRAPE C-ARM 42X72 X-RAY (DRAPES) IMPLANT
DRAPE CHEST BREAST 15X10 FENES (DRAPES) IMPLANT
ELECT REM PT RETURN 9FT ADLT (ELECTROSURGICAL) ×3
ELECTRODE REM PT RTRN 9FT ADLT (ELECTROSURGICAL) ×2 IMPLANT
GAUZE 4X4 16PLY ~~LOC~~+RFID DBL (SPONGE) ×6 IMPLANT
GLOVE SRG 8 PF TXTR STRL LF DI (GLOVE) IMPLANT
GLOVE SURG ENC MOIS LTX SZ7.5 (GLOVE) ×3 IMPLANT
GLOVE SURG POLY ORTHO LF SZ7.5 (GLOVE) ×3 IMPLANT
GLOVE SURG UNDER LTX SZ8 (GLOVE) IMPLANT
GLOVE SURG UNDER POLY LF SZ8 (GLOVE)
GOWN STRL REUS W/ TWL LRG LVL3 (GOWN DISPOSABLE) ×6 IMPLANT
GOWN STRL REUS W/TWL LRG LVL3 (GOWN DISPOSABLE) ×9
KIT BASIN OR (CUSTOM PROCEDURE TRAY) ×3 IMPLANT
KIT PALINDROME-P 55CM (CATHETERS) IMPLANT
KIT TURNOVER KIT B (KITS) ×3 IMPLANT
NEEDLE 18GX1X1/2 (RX/OR ONLY) (NEEDLE) IMPLANT
NEEDLE HYPO 25GX1X1/2 BEV (NEEDLE) IMPLANT
NS IRRIG 1000ML POUR BTL (IV SOLUTION) ×3 IMPLANT
PACK CV ACCESS (CUSTOM PROCEDURE TRAY) ×3 IMPLANT
PACK SURGICAL SETUP 50X90 (CUSTOM PROCEDURE TRAY) ×3 IMPLANT
PAD ARMBOARD 7.5X6 YLW CONV (MISCELLANEOUS) ×6 IMPLANT
PAD CAST 4YDX4 CTTN HI CHSV (CAST SUPPLIES) ×2 IMPLANT
PADDING CAST COTTON 4X4 STRL (CAST SUPPLIES) ×3
SPONGE SURGIFOAM ABS GEL 100 (HEMOSTASIS) IMPLANT
SPONGE T-LAP 18X18 ~~LOC~~+RFID (SPONGE) ×3 IMPLANT
SUT ETHILON 3 0 PS 1 (SUTURE) ×3 IMPLANT
SUT MNCRL AB 4-0 PS2 18 (SUTURE) ×3 IMPLANT
SUT PROLENE 5 0 C 1 24 (SUTURE) ×6 IMPLANT
SUT PROLENE 6 0 BV (SUTURE) ×9 IMPLANT
SUT VIC AB 3-0 SH 27 (SUTURE) ×3
SUT VIC AB 3-0 SH 27X BRD (SUTURE) ×2 IMPLANT
SYR 10ML LL (SYRINGE) ×3 IMPLANT
SYR 20ML LL LF (SYRINGE) ×6 IMPLANT
SYR 5ML LL (SYRINGE) ×6 IMPLANT
SYR CONTROL 10ML LL (SYRINGE) ×3 IMPLANT
TOWEL GREEN STERILE (TOWEL DISPOSABLE) ×6 IMPLANT
TOWEL GREEN STERILE FF (TOWEL DISPOSABLE) ×3 IMPLANT
UNDERPAD 30X36 HEAVY ABSORB (UNDERPADS AND DIAPERS) ×3 IMPLANT
WATER STERILE IRR 1000ML POUR (IV SOLUTION) ×3 IMPLANT

## 2021-01-01 NOTE — Interval H&P Note (Signed)
History and Physical Interval Note:  01/01/2021 6:31 AM  Javier Jenkins  has presented today for surgery, with the diagnosis of Pseudoaneurysm.  The various methods of treatment have been discussed with the patient and family. After consideration of risks, benefits and other options for treatment, the patient has consented to  Procedure(s): PLICATION OF LEFT ARTERIOVENOUS FISTULA  PSEUDOANEURYSM (Left) INSERTION OF TUNNELED DIALYSIS CATHETER (N/A) as a surgical intervention.  The patient's history has been reviewed, patient examined, no change in status, stable for surgery.  I have reviewed the patient's chart and labs.  Questions were answered to the patient's satisfaction.     [name]

## 2021-01-01 NOTE — Op Note (Signed)
    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 Scot Dock, MD  ASSIST: Delena Serve, RNFA  ANESTHESIA: General  EBL: 50 cc  INDICATIONS:    [name] is a 70 y.o. male who was seen in the office with a eschar overlying an aneurysm of his fistula.  He was set up for excision of the eschar and plication of this aneurysm.  In addition he had a larger aneurysm further peripherally but the plan was to address this in a staged fashion and try to avoid placement of a catheter.  FINDINGS:   The fistula had a good thrill at the completion of the procedure.  There was a palpable radial pulse.  TECHNIQUE:   The patient was taken to the operating room and I looked at the left brachiocephalic fistula at the area of concern and there was no significant thrombus noted in the fistula at this level.  The patient received a general anesthetic.  The left arm was prepped and draped in usual sterile fashion.  I made a elliptical incision encompassing the eschar.  The length of the incision was 3 times the width.  Here the fistula was dissected free.  Of note along the lateral edge the aneurysm was significantly adherent to the skin making the dissection difficult.  At one point I entered the fistula and pressure was held to control this.  The patient was heparinized and a tourniquet placed on the upper arm.  The arm was exsanguinated with an Esmarch bandage and the tourniquet inflated to 200 mmHg.  Under tourniquet control I was unable to further dissect out the aneurysm.  I excised an ellipse of the aneurysm and then sewed the fistula back in 2 layers with 5-0 Prolene suture.  The tourniquet was then released.  I then slightly rolled the fistula  laterally to protect the suture line with interrupted 6-0 Prolene sutures.  There was still a good thrill in the fistula and a palpable radial pulse.  The heparin was partially reversed with protamine.  The wound was then closed with a deep layer of 3-0 Vicryl and the skin closed with 4-0 Monocryl.  Dermabond was applied.  The patient tolerated the procedure well and was transferred to recovery room in stable condition.  All needle and sponge counts were correct.  Given the complexity of the case a first assistant was necessary in order to expedient the procedure and safely perform the technical aspects of the operation.  [name], MD, FACS [facility]  DATE OF DICTATION:   01/01/2021

## 2021-01-01 NOTE — Anesthesia Postprocedure Evaluation (Signed)
Anesthesia Post Note  Patient: Javier Jenkins  Procedure(s) Performed: PLICATION OF LEFT ARTERIOVENOUS FISTULA  PSEUDOANEURYSM (Left: Arm Upper)     Patient location during evaluation: PACU Anesthesia Type: General Level of consciousness: awake and alert Pain management: pain level controlled Vital Signs Assessment: post-procedure vital signs reviewed and stable Respiratory status: spontaneous breathing, nonlabored ventilation and respiratory function stable Cardiovascular status: stable and blood pressure returned to baseline Anesthetic complications: no   No notable events documented.  Last Vitals:  Vitals:   01/01/21 0910 01/01/21 0920  BP: 102/70 96/76  Pulse: 62 (!) 59  Resp: 13 11  Temp:  36.4 C  SpO2: 100% 100%    Last Pain:  Vitals:   01/01/21 0920  TempSrc:   PainSc: 0-No pain                 Audry Pili

## 2021-01-01 NOTE — Anesthesia Procedure Notes (Signed)
Procedure Name: LMA Insertion Date/Time: 01/01/2021 7:39 AM Performed by: Vonna Drafts, CRNA Pre-anesthesia Checklist: Patient identified, Emergency Drugs available, Suction available, Patient being monitored and Timeout performed Patient Re-evaluated:Patient Re-evaluated prior to induction Oxygen Delivery Method: Circle system utilized Preoxygenation: Pre-oxygenation with 100% oxygen Induction Type: IV induction Ventilation: Mask ventilation without difficulty LMA: LMA inserted LMA Size: 4.0 Number of attempts: 1 Tube secured with: Tape Dental Injury: Teeth and Oropharynx as per pre-operative assessment

## 2021-01-01 NOTE — Transfer of Care (Signed)
Immediate Anesthesia Transfer of Care Note  Patient: Javier Jenkins  Procedure(s) Performed: PLICATION OF LEFT ARTERIOVENOUS FISTULA  PSEUDOANEURYSM (Left: Arm Upper)  Patient Location: PACU  Anesthesia Type:General  Level of Consciousness: drowsy  Airway & Oxygen Therapy: Patient Spontanous Breathing and Patient connected to face mask oxygen  Post-op Assessment: Report given to RN and Post -op Vital signs reviewed and stable  Post vital signs: Reviewed and stable  Last Vitals:  Vitals Value Taken Time  BP 106/73 01/01/21 0856  Temp    Pulse 62 01/01/21 0857  Resp 11 01/01/21 0857  SpO2 100 % 01/01/21 0857  Vitals shown include unvalidated device data.  Last Pain:  Vitals:   01/01/21 0634  TempSrc:   PainSc: 0-No pain         Complications: No notable events documented.

## 2021-01-02 ENCOUNTER — Encounter (HOSPITAL_COMMUNITY): Payer: Self-pay | Admitting: Vascular Surgery

## 2021-01-02 DIAGNOSIS — N2581 Secondary hyperparathyroidism of renal origin: Secondary | ICD-10-CM | POA: Diagnosis not present

## 2021-01-02 DIAGNOSIS — N186 End stage renal disease: Secondary | ICD-10-CM | POA: Diagnosis not present

## 2021-01-02 DIAGNOSIS — Z992 Dependence on renal dialysis: Secondary | ICD-10-CM | POA: Diagnosis not present

## 2021-01-04 DIAGNOSIS — N2581 Secondary hyperparathyroidism of renal origin: Secondary | ICD-10-CM | POA: Diagnosis not present

## 2021-01-04 DIAGNOSIS — Z992 Dependence on renal dialysis: Secondary | ICD-10-CM | POA: Diagnosis not present

## 2021-01-04 DIAGNOSIS — N186 End stage renal disease: Secondary | ICD-10-CM | POA: Diagnosis not present

## 2021-01-06 DIAGNOSIS — Z992 Dependence on renal dialysis: Secondary | ICD-10-CM | POA: Diagnosis not present

## 2021-01-06 DIAGNOSIS — N186 End stage renal disease: Secondary | ICD-10-CM | POA: Diagnosis not present

## 2021-01-06 DIAGNOSIS — N2581 Secondary hyperparathyroidism of renal origin: Secondary | ICD-10-CM | POA: Diagnosis not present

## 2021-01-09 DIAGNOSIS — N2581 Secondary hyperparathyroidism of renal origin: Secondary | ICD-10-CM | POA: Diagnosis not present

## 2021-01-09 DIAGNOSIS — N186 End stage renal disease: Secondary | ICD-10-CM | POA: Diagnosis not present

## 2021-01-09 DIAGNOSIS — Z992 Dependence on renal dialysis: Secondary | ICD-10-CM | POA: Diagnosis not present

## 2021-01-11 DIAGNOSIS — Z992 Dependence on renal dialysis: Secondary | ICD-10-CM | POA: Diagnosis not present

## 2021-01-11 DIAGNOSIS — N2581 Secondary hyperparathyroidism of renal origin: Secondary | ICD-10-CM | POA: Diagnosis not present

## 2021-01-11 DIAGNOSIS — N186 End stage renal disease: Secondary | ICD-10-CM | POA: Diagnosis not present

## 2021-01-13 DIAGNOSIS — Z992 Dependence on renal dialysis: Secondary | ICD-10-CM | POA: Diagnosis not present

## 2021-01-13 DIAGNOSIS — N186 End stage renal disease: Secondary | ICD-10-CM | POA: Diagnosis not present

## 2021-01-13 DIAGNOSIS — N2581 Secondary hyperparathyroidism of renal origin: Secondary | ICD-10-CM | POA: Diagnosis not present

## 2021-01-16 DIAGNOSIS — N2581 Secondary hyperparathyroidism of renal origin: Secondary | ICD-10-CM | POA: Diagnosis not present

## 2021-01-16 DIAGNOSIS — N186 End stage renal disease: Secondary | ICD-10-CM | POA: Diagnosis not present

## 2021-01-16 DIAGNOSIS — Z992 Dependence on renal dialysis: Secondary | ICD-10-CM | POA: Diagnosis not present

## 2021-01-17 ENCOUNTER — Other Ambulatory Visit: Payer: Self-pay

## 2021-01-17 ENCOUNTER — Encounter: Payer: Self-pay | Admitting: Nurse Practitioner

## 2021-01-17 ENCOUNTER — Ambulatory Visit (INDEPENDENT_AMBULATORY_CARE_PROVIDER_SITE_OTHER): Payer: Medicare PPO | Admitting: Nurse Practitioner

## 2021-01-17 VITALS — BP 110/60 | HR 83 | Temp 97.7°F | Ht 68.8 in | Wt 206.2 lb

## 2021-01-17 DIAGNOSIS — I120 Hypertensive chronic kidney disease with stage 5 chronic kidney disease or end stage renal disease: Secondary | ICD-10-CM | POA: Diagnosis not present

## 2021-01-17 DIAGNOSIS — I69351 Hemiplegia and hemiparesis following cerebral infarction affecting right dominant side: Secondary | ICD-10-CM

## 2021-01-17 DIAGNOSIS — I700 Atherosclerosis of aorta: Secondary | ICD-10-CM | POA: Diagnosis not present

## 2021-01-17 DIAGNOSIS — Z6830 Body mass index (BMI) 30.0-30.9, adult: Secondary | ICD-10-CM

## 2021-01-17 DIAGNOSIS — E039 Hypothyroidism, unspecified: Secondary | ICD-10-CM

## 2021-01-17 DIAGNOSIS — N186 End stage renal disease: Secondary | ICD-10-CM

## 2021-01-17 DIAGNOSIS — E6609 Other obesity due to excess calories: Secondary | ICD-10-CM

## 2021-01-17 NOTE — Patient Instructions (Signed)

## 2021-01-17 NOTE — Progress Notes (Signed)
I,Tianna Badgett,acting as a Education administrator for [employer], FNP.,have documented all relevant documentation on the behalf of [name], FNP,as directed by  [name], FNP while in the presence of [name], [place].  This visit occurred during the SARS-CoV-2 public health emergency.  Safety protocols were in place, including screening questions prior to the visit, additional usage of staff PPE, and extensive cleaning of exam room while observing appropriate contact time as indicated for disinfecting solutions.  Subjective:     Patient ID: Javier Jenkins , male    DOB: December 31, 1950 , 70 y.o.   MRN: [id number]   Chief Complaint  Patient presents with   Hypertension    HPI  Pt is here today for follow up for blood pressure, he has no other concerns today he has been compliant with all meds. Continues to have hemodialysis 3 days a week. Lives with his wife.  He is requesting a prescription for a foot brace from the [facility]. He has not had a new one in about 10 years.   Hypertension This is a chronic problem. The current episode started more than 1 year ago. The problem is unchanged. The problem is controlled. Pertinent negatives include no anxiety, chest pain, headaches or palpitations. There are no associated agents to hypertension. Risk factors for coronary artery disease include sedentary lifestyle and male gender. Past treatments include beta blockers. The current treatment provides no improvement. Compliance problems: unable to exercise due to history of stroke.  There is no history of angina. There is no history of chronic renal disease.    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 (Patient taking differently: Take 325 mg by mouth every evening.), Disp: 30 tablet, Rfl: 0   atorvastatin (LIPITOR) 10 MG tablet, TAKE 1 TABLET BY MOUTH EVERY DAY (Patient taking differently: Take 10 mg by mouth every evening.), Disp: 90 tablet, Rfl: 1   cinacalcet (SENSIPAR) 60 MG tablet, Take 60 mg by mouth every evening., Disp: , Rfl:    ferric citrate (AURYXIA) 1 GM 210 MG(Fe) tablet, Take 630 mg by mouth in the morning and at bedtime., Disp: , Rfl:    levETIRAcetam (KEPPRA) 500 MG tablet, TAKE 1 TABLET BY MOUTH TWICE A DAY, Disp: 180 tablet, Rfl: 1   levothyroxine (SYNTHROID) 25 MCG tablet, TAKE 1 TABLET BY MOUTH EVERY DAY (Patient taking differently: Take 25 mcg by mouth every  evening.), Disp: 90 tablet, Rfl: 1   metoprolol tartrate (LOPRESSOR) 100 MG tablet, TAKE 1 TABLET BY MOUTH TWICE A DAY, Disp: 180 tablet, Rfl: 0   multivitamin (RENA-VIT) TABS tablet, Take 1 tablet by mouth at bedtime., Disp: 90 tablet, Rfl: 0   oxyCODONE (ROXICODONE) 5 MG immediate release tablet, Take 1 tablet (5 mg total) by mouth every 4 (four) hours as needed., Disp: 15 tablet, Rfl: 0   Allergies  Allergen Reactions   Tape Hives    Paper tape and adhesives Other reaction(s): Hives     Review of Systems  Constitutional: Negative.   Respiratory: Negative.    Cardiovascular: Negative.  Negative for chest pain and palpitations.   Gastrointestinal: Negative.   Musculoskeletal: Negative.   Neurological: Negative.  Negative for headaches.  Psychiatric/Behavioral: Negative.      Today's Vitals   01/17/21 1506  BP: 110/60  Pulse: 83  Temp: 97.7 F (36.5 C)  TempSrc: Oral  Weight: 206 lb 3.2 oz (93.5 kg)  Height: 5' 8.8" (1.748 m)   Body mass index is 30.63 kg/m.  Wt Readings from Last 3 Encounters:  01/17/21 206 lb 3.2 oz (93.5 kg)  01/01/21 203 lb 0.7 oz (92.1 kg)  12/29/20 203 lb (92.1 kg)    Objective:  Physical Exam Vitals reviewed.  Constitutional:      General: He is not in acute distress.    Appearance: Normal appearance. He is obese.  HENT:     Head: Normocephalic.  Eyes:     Pupils: Pupils are equal, round, and reactive to light.  Cardiovascular:     Rate and Rhythm: Normal rate and regular rhythm.     Pulses: Normal pulses.     Heart sounds: Normal heart sounds. No murmur heard.    Arteriovenous access: Left arteriovenous access is present. Pulmonary:     Effort: Pulmonary effort is normal. No respiratory distress.     Breath sounds: Normal breath sounds. No wheezing.  Musculoskeletal:     Comments: Leg brace on right lower extremity from previous stroke. Has flaccid right upper extremity.   Skin:    General: Skin is warm and dry.     Capillary Refill: Capillary refill takes less than 2 seconds.  Neurological:     General: No focal deficit present.     Mental Status: He is alert and oriented to person, place, and time.     Cranial Nerves: No cranial nerve deficit.     Motor: No weakness.  Psychiatric:        Mood and Affect: Mood normal.        Behavior: Behavior normal.        Thought Content: Thought content normal.        Judgment: Judgment normal.        Assessment And Plan:     1. Benign hypertension with end-stage renal disease ([place]) Comments: Blood pressure is well controlled. Continue current medications - BMP8+eGFR  2. Atherosclerosis of aorta ([place]) Comments:  Tolerating statin wel.   3. Acquired hypothyroidism Comments: Well controlled, continue current medications will make changes as necessary - TSH - T4 - T3, free  4. Hemiparesis affecting right side as late effect of stroke ([place]) Comments: Rx given for new right lower extremity leg brace. This allows him to have a better quality of life.   5. Class 1 obesity due to excess calories with serious comorbidity and body mass index (BMI) of 30.0 to 30.9 in adult  He is encouraged  to initially strive for BMI less than 30 to decrease cardiac risk. He is advised to exercise no less than 150 minutes per week.   6. ESRD (end stage renal disease) ([place]) Overall doing well, had a revision to his AV fistula in left arm 2 weeks ago - BMP8+eGFR    Patient was given opportunity to ask questions. Patient verbalized understanding of the plan and was able to repeat key elements of the plan. All questions were answered to their satisfaction.  [name], FNP   I, [name], FNP, have reviewed all documentation for this visit. The documentation on 01/17/21 for the exam, diagnosis, procedures, and orders are all accurate and complete.   IF YOU HAVE BEEN REFERRED TO A SPECIALIST, IT MAY TAKE 1-2 WEEKS TO SCHEDULE/PROCESS THE REFERRAL. IF YOU HAVE NOT HEARD FROM US/SPECIALIST IN TWO WEEKS, PLEASE GIVE [place] A CALL AT [phone].   THE PATIENT IS ENCOURAGED TO PRACTICE SOCIAL DISTANCING DUE TO THE COVID-19 PANDEMIC.

## 2021-01-18 DIAGNOSIS — N186 End stage renal disease: Secondary | ICD-10-CM | POA: Diagnosis not present

## 2021-01-18 DIAGNOSIS — N2581 Secondary hyperparathyroidism of renal origin: Secondary | ICD-10-CM | POA: Diagnosis not present

## 2021-01-18 DIAGNOSIS — Z992 Dependence on renal dialysis: Secondary | ICD-10-CM | POA: Diagnosis not present

## 2021-01-18 LAB — T4: T4, Total: 6.5 ug/dL (ref 4.5–12.0)

## 2021-01-18 LAB — TSH: TSH: 2.78 u[IU]/mL (ref 0.450–4.500)

## 2021-01-18 LAB — BMP8+EGFR
BUN/Creatinine Ratio: 4 — ABNORMAL LOW (ref 10–24)
BUN: 33 mg/dL — ABNORMAL HIGH (ref 8–27)
CO2: 28 mmol/L (ref 20–29)
Calcium: 8.3 mg/dL — ABNORMAL LOW (ref 8.6–10.2)
Chloride: 94 mmol/L — ABNORMAL LOW (ref 96–106)
Creatinine, Ser: 8.56 mg/dL — ABNORMAL HIGH (ref 0.76–1.27)
Glucose: 78 mg/dL (ref 70–99)
Potassium: 4.3 mmol/L (ref 3.5–5.2)
Sodium: 139 mmol/L (ref 134–144)
eGFR: 6 mL/min/{1.73_m2} — ABNORMAL LOW (ref >59)

## 2021-01-18 LAB — T3, FREE: T3, Free: 2.3 pg/mL (ref 2.0–4.4)

## 2021-01-20 DIAGNOSIS — Z992 Dependence on renal dialysis: Secondary | ICD-10-CM | POA: Diagnosis not present

## 2021-01-20 DIAGNOSIS — N2581 Secondary hyperparathyroidism of renal origin: Secondary | ICD-10-CM | POA: Diagnosis not present

## 2021-01-20 DIAGNOSIS — N186 End stage renal disease: Secondary | ICD-10-CM | POA: Diagnosis not present

## 2021-01-23 DIAGNOSIS — Z992 Dependence on renal dialysis: Secondary | ICD-10-CM | POA: Diagnosis not present

## 2021-01-23 DIAGNOSIS — N186 End stage renal disease: Secondary | ICD-10-CM | POA: Diagnosis not present

## 2021-01-23 DIAGNOSIS — N2581 Secondary hyperparathyroidism of renal origin: Secondary | ICD-10-CM | POA: Diagnosis not present

## 2021-01-25 DIAGNOSIS — Z992 Dependence on renal dialysis: Secondary | ICD-10-CM | POA: Diagnosis not present

## 2021-01-25 DIAGNOSIS — N2581 Secondary hyperparathyroidism of renal origin: Secondary | ICD-10-CM | POA: Diagnosis not present

## 2021-01-25 DIAGNOSIS — N186 End stage renal disease: Secondary | ICD-10-CM | POA: Diagnosis not present

## 2021-01-27 ENCOUNTER — Other Ambulatory Visit: Payer: Self-pay | Admitting: Nurse Practitioner

## 2021-01-27 DIAGNOSIS — N2581 Secondary hyperparathyroidism of renal origin: Secondary | ICD-10-CM | POA: Diagnosis not present

## 2021-01-27 DIAGNOSIS — I129 Hypertensive chronic kidney disease with stage 1 through stage 4 chronic kidney disease, or unspecified chronic kidney disease: Secondary | ICD-10-CM | POA: Diagnosis not present

## 2021-01-27 DIAGNOSIS — R7303 Prediabetes: Secondary | ICD-10-CM

## 2021-01-27 DIAGNOSIS — Z992 Dependence on renal dialysis: Secondary | ICD-10-CM | POA: Diagnosis not present

## 2021-01-27 DIAGNOSIS — N186 End stage renal disease: Secondary | ICD-10-CM | POA: Diagnosis not present

## 2021-04-05 ENCOUNTER — Encounter: Payer: Self-pay | Admitting: Vascular Surgery

## 2021-04-05 ENCOUNTER — Other Ambulatory Visit: Payer: Self-pay

## 2021-04-05 ENCOUNTER — Ambulatory Visit (INDEPENDENT_AMBULATORY_CARE_PROVIDER_SITE_OTHER): Payer: Medicare (Managed Care) | Admitting: Vascular Surgery

## 2021-04-05 VITALS — BP 120/83 | HR 88 | Temp 98.1°F | Resp 20 | Ht 68.0 in | Wt 203.0 lb

## 2021-04-05 DIAGNOSIS — Z992 Dependence on renal dialysis: Secondary | ICD-10-CM

## 2021-04-05 DIAGNOSIS — I770 Arteriovenous fistula, acquired: Secondary | ICD-10-CM

## 2021-04-05 DIAGNOSIS — N186 End stage renal disease: Secondary | ICD-10-CM

## 2021-04-05 NOTE — Progress Notes (Signed)
? ?Patient name: Javier Jenkins MRN: [id number] DOB: 1950-01-30 Sex: male ? ?REASON FOR VISIT:  ? ?To discuss plication of AV fistula. ? ?HPI:  ? ?Javier Jenkins is a pleasant 71 y.o. 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  He was set up for excision of the eschar and plication of the aneurysm.  In addition he had a large aneurysm peripherally I elected to address this in a staged fashion.  On 41/06/3843 he underwent plication of his aneurysm in the left upper arm fistula with excision of the eschar.  He comes in to discuss addressing the other aneurysm. ? ?Today he has no specific complaints.  His fistula has been working well.  He dialyzes on Tuesdays Thursdays and Saturdays. ? ?Current Outpatient Medications  ?Medication Sig Dispense Refill  ? aspirin 325 MG tablet Take 1 tablet (325 mg total) by mouth daily. (Patient taking differently: Take 325 mg by mouth every evening.) 30 tablet 0  ? atorvastatin (LIPITOR) 10 MG tablet TAKE 1 TABLET BY MOUTH EVERY DAY 90 tablet 1  ? cinacalcet (SENSIPAR) 60 MG tablet Take 60 mg by mouth every evening.    ? ferric citrate (AURYXIA) 1 GM 210 MG(Fe) tablet Take 630 mg by mouth in the morning and at bedtime.    ? levETIRAcetam (KEPPRA) 500 MG tablet TAKE 1 TABLET BY MOUTH TWICE A DAY 180 tablet 1  ? levothyroxine (SYNTHROID) 25 MCG tablet TAKE 1 TABLET BY MOUTH EVERY DAY (Patient taking differently: Take 25 mcg by mouth every evening.) 90 tablet 1  ? metoprolol tartrate (LOPRESSOR) 100 MG tablet TAKE 1 TABLET BY MOUTH TWICE A DAY 180 tablet 0  ? multivitamin (RENA-VIT) TABS tablet Take 1 tablet by mouth at bedtime. 90 tablet 0  ? oxyCODONE (ROXICODONE) 5 MG immediate release tablet Take 1 tablet (5 mg total) by mouth every 4 (four) hours as needed. 15 tablet 0  ? ?No current facility-administered medications for this visit.  ? ? ?REVIEW OF SYSTEMS:  ?'[X]'$  denotes positive finding, '[ ]'$  denotes negative  finding ?Vascular    ?Leg swelling    ?Cardiac    ?Chest pain or chest pressure:    ?Shortness of breath upon exertion:    ?Short of breath when lying flat:    ?Irregular heart rhythm:    ?Constitutional    ?Fever or chills:    ? ?PHYSICAL EXAM:  ? ?Vitals:  ? 04/05/21 1258  ?BP: 120/83  ?Pulse: 88  ?Resp: 20  ?Temp: 98.1 ?F (36.7 ?C)  ?SpO2: 95%  ?Weight: 203 lb (92.1 kg)  ?Height: '5\' 8"'$  (1.727 m)  ? ? ?GENERAL: The patient is a well-nourished male, in no acute distress. The vital signs are documented above. ?CARDIOVASCULAR: There is a regular rate and rhythm. ?PULMONARY: There is good air exchange bilaterally without wheezing or rales. ?VASCULAR: He has a good thrill in his left upper arm fistula.  He has a palpable radial pulse.  He has a large aneurysm in the central portion of his fistula. ? ? ? ? ? ?DATA:  ? ?No new data ? ?MEDICAL ISSUES:  ? ?ANEURYSM LEFT AV FISTULA: I think he is now ready for staged plication of the second large aneurysm in his left upper arm fistula.  I think there is enough room to cannulate the fistula above and below this so I do not think he will need a catheter placed.  I have scheduled  this for Monday 04/09/21.  We have discussed the indications and potential complications and he is agreeable to proceed. ? ?[name] ?[facility] ?[phone] ?

## 2021-04-05 NOTE — H&P (View-Only) (Signed)
? ?Patient name: Javier Jenkins MRN: [id number] DOB: 1950-06-17 Sex: male ? ?REASON FOR VISIT:  ? ?To discuss plication of AV fistula. ? ?HPI:  ? ?Javier Jenkins is a pleasant 71 y.o. 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  He was set up for excision of the eschar and plication of the aneurysm.  In addition he had a large aneurysm peripherally I elected to address this in a staged fashion.  On 82/04/2351 he underwent plication of his aneurysm in the left upper arm fistula with excision of the eschar.  He comes in to discuss addressing the other aneurysm. ? ?Today he has no specific complaints.  His fistula has been working well.  He dialyzes on Tuesdays Thursdays and Saturdays. ? ?Current Outpatient Medications  ?Medication Sig Dispense Refill  ? aspirin 325 MG tablet Take 1 tablet (325 mg total) by mouth daily. (Patient taking differently: Take 325 mg by mouth every evening.) 30 tablet 0  ? atorvastatin (LIPITOR) 10 MG tablet TAKE 1 TABLET BY MOUTH EVERY DAY 90 tablet 1  ? cinacalcet (SENSIPAR) 60 MG tablet Take 60 mg by mouth every evening.    ? ferric citrate (AURYXIA) 1 GM 210 MG(Fe) tablet Take 630 mg by mouth in the morning and at bedtime.    ? levETIRAcetam (KEPPRA) 500 MG tablet TAKE 1 TABLET BY MOUTH TWICE A DAY 180 tablet 1  ? levothyroxine (SYNTHROID) 25 MCG tablet TAKE 1 TABLET BY MOUTH EVERY DAY (Patient taking differently: Take 25 mcg by mouth every evening.) 90 tablet 1  ? metoprolol tartrate (LOPRESSOR) 100 MG tablet TAKE 1 TABLET BY MOUTH TWICE A DAY 180 tablet 0  ? multivitamin (RENA-VIT) TABS tablet Take 1 tablet by mouth at bedtime. 90 tablet 0  ? oxyCODONE (ROXICODONE) 5 MG immediate release tablet Take 1 tablet (5 mg total) by mouth every 4 (four) hours as needed. 15 tablet 0  ? ?No current facility-administered medications for this visit.  ? ? ?REVIEW OF SYSTEMS:  ?'[X]'$  denotes positive finding, '[ ]'$  denotes negative  finding ?Vascular    ?Leg swelling    ?Cardiac    ?Chest pain or chest pressure:    ?Shortness of breath upon exertion:    ?Short of breath when lying flat:    ?Irregular heart rhythm:    ?Constitutional    ?Fever or chills:    ? ?PHYSICAL EXAM:  ? ?Vitals:  ? 04/05/21 1258  ?BP: 120/83  ?Pulse: 88  ?Resp: 20  ?Temp: 98.1 ?F (36.7 ?C)  ?SpO2: 95%  ?Weight: 203 lb (92.1 kg)  ?Height: '5\' 8"'$  (1.727 m)  ? ? ?GENERAL: The patient is a well-nourished male, in no acute distress. The vital signs are documented above. ?CARDIOVASCULAR: There is a regular rate and rhythm. ?PULMONARY: There is good air exchange bilaterally without wheezing or rales. ?VASCULAR: He has a good thrill in his left upper arm fistula.  He has a palpable radial pulse.  He has a large aneurysm in the central portion of his fistula. ? ? ? ? ? ?DATA:  ? ?No new data ? ?MEDICAL ISSUES:  ? ?ANEURYSM LEFT AV FISTULA: I think he is now ready for staged plication of the second large aneurysm in his left upper arm fistula.  I think there is enough room to cannulate the fistula above and below this so I do not think he will need a catheter placed.  I have scheduled  this for Monday 04/09/21.  We have discussed the indications and potential complications and he is agreeable to proceed. ? ?[name] ?[facility] ?[phone] ?

## 2021-04-06 ENCOUNTER — Encounter (HOSPITAL_COMMUNITY): Payer: Self-pay | Admitting: Vascular Surgery

## 2021-04-06 ENCOUNTER — Other Ambulatory Visit: Payer: Self-pay

## 2021-04-06 NOTE — Progress Notes (Signed)
Spoke with pt for pre-op call. Pt denies cardiac history but is treated for HTN. Pt is pre-diabetic, he does not check his blood sugar at home and he states dialysis checks his A1C but he can't remember what the most recent one was.  ? ?Pt's surgery is scheduled as ambulatory so no Covid test is required prior to surgery.Marland Kitchen ?

## 2021-04-06 NOTE — Progress Notes (Signed)
patient voiced understanding of new arrival time of 0730 Monday  ?

## 2021-04-09 ENCOUNTER — Ambulatory Visit (HOSPITAL_COMMUNITY): Payer: Medicare (Managed Care) | Admitting: Anesthesiology

## 2021-04-09 ENCOUNTER — Encounter (HOSPITAL_COMMUNITY): Payer: Self-pay | Admitting: Vascular Surgery

## 2021-04-09 ENCOUNTER — Other Ambulatory Visit: Payer: Self-pay

## 2021-04-09 ENCOUNTER — Ambulatory Visit (HOSPITAL_COMMUNITY)
Admission: RE | Admit: 2021-04-09 | Discharge: 2021-04-09 | Disposition: A | Discharge status: Home / Self Care | Payer: Medicare (Managed Care) | Attending: Vascular Surgery | Admitting: Vascular Surgery

## 2021-04-09 ENCOUNTER — Encounter (HOSPITAL_COMMUNITY)
Admission: RE | Disposition: A | Discharge status: Home / Self Care | Payer: Self-pay | Source: Home / Self Care | Attending: Vascular Surgery

## 2021-04-09 ENCOUNTER — Ambulatory Visit (HOSPITAL_BASED_OUTPATIENT_CLINIC_OR_DEPARTMENT_OTHER): Payer: Medicare (Managed Care) | Admitting: Anesthesiology

## 2021-04-09 DIAGNOSIS — E039 Hypothyroidism, unspecified: Secondary | ICD-10-CM

## 2021-04-09 DIAGNOSIS — N186 End stage renal disease: Secondary | ICD-10-CM | POA: Diagnosis not present

## 2021-04-09 DIAGNOSIS — Z992 Dependence on renal dialysis: Secondary | ICD-10-CM | POA: Insufficient documentation

## 2021-04-09 DIAGNOSIS — T82590A Other mechanical complication of surgically created arteriovenous fistula, initial encounter: Secondary | ICD-10-CM | POA: Diagnosis present

## 2021-04-09 DIAGNOSIS — I69951 Hemiplegia and hemiparesis following unspecified cerebrovascular disease affecting right dominant side: Secondary | ICD-10-CM | POA: Diagnosis not present

## 2021-04-09 DIAGNOSIS — D759 Disease of blood and blood-forming organs, unspecified: Secondary | ICD-10-CM | POA: Insufficient documentation

## 2021-04-09 DIAGNOSIS — I770 Arteriovenous fistula, acquired: Secondary | ICD-10-CM | POA: Diagnosis not present

## 2021-04-09 DIAGNOSIS — E1122 Type 2 diabetes mellitus with diabetic chronic kidney disease: Secondary | ICD-10-CM | POA: Diagnosis not present

## 2021-04-09 DIAGNOSIS — I120 Hypertensive chronic kidney disease with stage 5 chronic kidney disease or end stage renal disease: Secondary | ICD-10-CM | POA: Diagnosis not present

## 2021-04-09 DIAGNOSIS — D649 Anemia, unspecified: Secondary | ICD-10-CM | POA: Insufficient documentation

## 2021-04-09 DIAGNOSIS — Z87891 Personal history of nicotine dependence: Secondary | ICD-10-CM | POA: Diagnosis not present

## 2021-04-09 DIAGNOSIS — R569 Unspecified convulsions: Secondary | ICD-10-CM | POA: Insufficient documentation

## 2021-04-09 DIAGNOSIS — C8330 Diffuse large B-cell lymphoma, unspecified site: Secondary | ICD-10-CM | POA: Insufficient documentation

## 2021-04-09 DIAGNOSIS — E6609 Other obesity due to excess calories: Secondary | ICD-10-CM

## 2021-04-09 DIAGNOSIS — T82898A Other specified complication of vascular prosthetic devices, implants and grafts, initial encounter: Secondary | ICD-10-CM | POA: Diagnosis not present

## 2021-04-09 DIAGNOSIS — N185 Chronic kidney disease, stage 5: Secondary | ICD-10-CM | POA: Diagnosis not present

## 2021-04-09 HISTORY — DX: Prediabetes: R73.03

## 2021-04-09 HISTORY — PX: FISTULA SUPERFICIALIZATION: SHX6341

## 2021-04-09 LAB — POCT I-STAT, CHEM 8
BUN: 36 mg/dL — ABNORMAL HIGH (ref 8–23)
Calcium, Ion: 0.93 mmol/L — ABNORMAL LOW (ref 1.15–1.40)
Chloride: 96 mmol/L — ABNORMAL LOW (ref 98–111)
Creatinine, Ser: 10.8 mg/dL — ABNORMAL HIGH (ref 0.61–1.24)
Glucose, Bld: 89 mg/dL (ref 70–99)
HCT: 41 % (ref 39.0–52.0)
Hemoglobin: 13.9 g/dL (ref 13.0–17.0)
Potassium: 5.8 mmol/L — ABNORMAL HIGH (ref 3.5–5.1)
Sodium: 137 mmol/L (ref 135–145)
TCO2: 33 mmol/L — ABNORMAL HIGH (ref 22–32)

## 2021-04-09 SURGERY — FISTULA SUPERFICIALIZATION
Anesthesia: General | Laterality: Left

## 2021-04-09 MED ORDER — ONDANSETRON HCL 4 MG/2ML IJ SOLN
INTRAMUSCULAR | Status: AC
  Filled 2021-04-09: qty 4

## 2021-04-09 MED ORDER — HEPARIN 6000 UNIT IRRIGATION SOLUTION
Status: DC | PRN
  Administered 2021-04-09: 1

## 2021-04-09 MED ORDER — ORAL CARE MOUTH RINSE: 15.0000 mL | Freq: Once | OROMUCOSAL | Status: AC

## 2021-04-09 MED ORDER — LIDOCAINE HCL (PF) 1 % IJ SOLN
INTRAMUSCULAR | Status: AC
  Filled 2021-04-09: qty 30

## 2021-04-09 MED ORDER — ONDANSETRON HCL 4 MG/2ML IJ SOLN: 4.0000 mg | Freq: Once | INTRAMUSCULAR | Status: DC | PRN

## 2021-04-09 MED ORDER — LIDOCAINE 2% (20 MG/ML) 5 ML SYRINGE
INTRAMUSCULAR | Status: AC
  Filled 2021-04-09: qty 10

## 2021-04-09 MED ORDER — CEFAZOLIN SODIUM-DEXTROSE 2-4 GM/100ML-% IV SOLN
2.0000 g | INTRAVENOUS | Status: AC
  Administered 2021-04-09: 2 g via INTRAVENOUS
  Filled 2021-04-09: qty 100

## 2021-04-09 MED ORDER — PAPAVERINE HCL 30 MG/ML IJ SOLN
INTRAMUSCULAR | Status: AC
  Filled 2021-04-09: qty 2

## 2021-04-09 MED ORDER — OXYCODONE HCL 5 MG PO TABS: 5.0000 mg | ORAL_TABLET | Freq: Once | ORAL | Status: DC | PRN

## 2021-04-09 MED ORDER — ACETAMINOPHEN 325 MG PO TABS: 650.0000 mg | ORAL_TABLET | Freq: Four times a day (QID) | ORAL | Status: DC | PRN

## 2021-04-09 MED ORDER — SODIUM CHLORIDE 0.9 % IV SOLN: INTRAVENOUS | Status: DC

## 2021-04-09 MED ORDER — LIDOCAINE 2% (20 MG/ML) 5 ML SYRINGE
INTRAMUSCULAR | Status: DC | PRN
  Administered 2021-04-09: 60 mg via INTRAVENOUS

## 2021-04-09 MED ORDER — CHLORHEXIDINE GLUCONATE 4 % EX LIQD
60.0000 mL | Freq: Once | CUTANEOUS | Status: DC
Start: 2021-04-09 — End: 2021-04-09

## 2021-04-09 MED ORDER — LIDOCAINE-EPINEPHRINE (PF) 1 %-1:200000 IJ SOLN
INTRAMUSCULAR | Status: DC | PRN
  Administered 2021-04-09: 14 mL

## 2021-04-09 MED ORDER — FENTANYL CITRATE (PF) 100 MCG/2ML IJ SOLN
INTRAMUSCULAR | Status: DC | PRN
Start: 2021-04-09 — End: 2021-04-09
  Administered 2021-04-09: 25 ug via INTRAVENOUS
  Administered 2021-04-09: 50 ug via INTRAVENOUS

## 2021-04-09 MED ORDER — SUGAMMADEX SODIUM 500 MG/5ML IV SOLN
INTRAVENOUS | Status: AC
  Filled 2021-04-09: qty 5

## 2021-04-09 MED ORDER — ONDANSETRON HCL 4 MG/2ML IJ SOLN
INTRAMUSCULAR | Status: DC | PRN
  Administered 2021-04-09: 4 mg via INTRAVENOUS

## 2021-04-09 MED ORDER — PROTAMINE SULFATE 10 MG/ML IV SOLN
INTRAVENOUS | Status: DC | PRN
  Administered 2021-04-09: 20 mg via INTRAVENOUS
  Administered 2021-04-09 (×4): 10 mg via INTRAVENOUS

## 2021-04-09 MED ORDER — HEPARIN 6000 UNIT IRRIGATION SOLUTION
Status: AC
  Filled 2021-04-09: qty 500

## 2021-04-09 MED ORDER — PROPOFOL 10 MG/ML IV BOLUS
INTRAVENOUS | Status: DC | PRN
Start: 2021-04-09 — End: 2021-04-09
  Administered 2021-04-09: 120 mg via INTRAVENOUS

## 2021-04-09 MED ORDER — PROPOFOL 10 MG/ML IV BOLUS
INTRAVENOUS | Status: AC
  Filled 2021-04-09: qty 20

## 2021-04-09 MED ORDER — FENTANYL CITRATE (PF) 250 MCG/5ML IJ SOLN
INTRAMUSCULAR | Status: AC
  Filled 2021-04-09: qty 5

## 2021-04-09 MED ORDER — CHLORHEXIDINE GLUCONATE 4 % EX LIQD: 60.0000 mL | Freq: Once | CUTANEOUS | Status: DC

## 2021-04-09 MED ORDER — HEPARIN SODIUM (PORCINE) 1000 UNIT/ML IJ SOLN
INTRAMUSCULAR | Status: DC | PRN
Start: 2021-04-09 — End: 2021-04-09
  Administered 2021-04-09: 9000 [IU] via INTRAVENOUS

## 2021-04-09 MED ORDER — OXYCODONE HCL 5 MG PO TABS: 5.0000 mg | ORAL_TABLET | ORAL | 0 refills | Status: DC | PRN

## 2021-04-09 MED ORDER — CHLORHEXIDINE GLUCONATE 0.12 % MT SOLN
15.0000 mL | Freq: Once | OROMUCOSAL | Status: AC
  Administered 2021-04-09: 15 mL via OROMUCOSAL
  Filled 2021-04-09: qty 15

## 2021-04-09 MED ORDER — EPHEDRINE 5 MG/ML INJ
INTRAVENOUS | Status: AC
  Filled 2021-04-09: qty 5

## 2021-04-09 MED ORDER — PHENYLEPHRINE 40 MCG/ML (10ML) SYRINGE FOR IV PUSH (FOR BLOOD PRESSURE SUPPORT)
PREFILLED_SYRINGE | INTRAVENOUS | Status: AC
  Filled 2021-04-09: qty 10

## 2021-04-09 MED ORDER — PHENYLEPHRINE HCL-NACL 20-0.9 MG/250ML-% IV SOLN
INTRAVENOUS | Status: DC | PRN
  Administered 2021-04-09: 50 ug/min via INTRAVENOUS

## 2021-04-09 MED ORDER — LIDOCAINE-EPINEPHRINE (PF) 1 %-1:200000 IJ SOLN
INTRAMUSCULAR | Status: AC
  Filled 2021-04-09: qty 30

## 2021-04-09 MED ORDER — FENTANYL CITRATE (PF) 100 MCG/2ML IJ SOLN: 25.0000 ug | INTRAMUSCULAR | Status: DC | PRN

## 2021-04-09 MED ORDER — OXYCODONE HCL 5 MG/5ML PO SOLN: 5.0000 mg | Freq: Once | ORAL | Status: DC | PRN

## 2021-04-09 MED ORDER — 0.9 % SODIUM CHLORIDE (POUR BTL) OPTIME
TOPICAL | Status: DC | PRN
  Administered 2021-04-09: 1000 mL

## 2021-04-09 SURGICAL SUPPLY — 36 items
ARMBAND PINK RESTRICT EXTREMIT (MISCELLANEOUS) ×2 IMPLANT
BAG COUNTER SPONGE SURGICOUNT (BAG) ×2 IMPLANT
CANISTER SUCT 3000ML PPV (MISCELLANEOUS) ×2 IMPLANT
CANNULA VESSEL 3MM 2 BLNT TIP (CANNULA) ×2 IMPLANT
CLIP VESOCCLUDE MED 6/CT (CLIP) ×2 IMPLANT
CLIP VESOCCLUDE SM WIDE 6/CT (CLIP) ×2 IMPLANT
DECANTER SPIKE VIAL GLASS SM (MISCELLANEOUS) ×2 IMPLANT
DERMABOND ADVANCED (GAUZE/BANDAGES/DRESSINGS) ×1
DERMABOND ADVANCED .7 DNX12 (GAUZE/BANDAGES/DRESSINGS) ×1 IMPLANT
ELECT REM PT RETURN 9FT ADLT (ELECTROSURGICAL) ×2
ELECTRODE REM PT RTRN 9FT ADLT (ELECTROSURGICAL) ×1 IMPLANT
GAUZE 4X4 16PLY ~~LOC~~+RFID DBL (SPONGE) ×1 IMPLANT
GLOVE SRG 8 PF TXTR STRL LF DI (GLOVE) ×1 IMPLANT
GLOVE SURG ENC MOIS LTX SZ7.5 (GLOVE) ×2 IMPLANT
GLOVE SURG POLY ORTHO LF SZ7.5 (GLOVE) IMPLANT
GLOVE SURG UNDER LTX SZ8 (GLOVE) ×2 IMPLANT
GLOVE SURG UNDER POLY LF SZ8 (GLOVE) ×2
GOWN STRL REUS W/ TWL LRG LVL3 (GOWN DISPOSABLE) ×3 IMPLANT
GOWN STRL REUS W/TWL LRG LVL3 (GOWN DISPOSABLE) ×6
KIT BASIN OR (CUSTOM PROCEDURE TRAY) ×2 IMPLANT
KIT TURNOVER KIT B (KITS) ×2 IMPLANT
NS IRRIG 1000ML POUR BTL (IV SOLUTION) ×2 IMPLANT
PACK CV ACCESS (CUSTOM PROCEDURE TRAY) ×2 IMPLANT
PAD ARMBOARD 7.5X6 YLW CONV (MISCELLANEOUS) ×4 IMPLANT
SLING ARM FOAM STRAP LRG (SOFTGOODS) IMPLANT
SLING ARM FOAM STRAP MED (SOFTGOODS) IMPLANT
SPONGE SURGIFOAM ABS GEL 100 (HEMOSTASIS) IMPLANT
SPONGE T-LAP 18X18 ~~LOC~~+RFID (SPONGE) ×1 IMPLANT
SUT MNCRL AB 4-0 PS2 18 (SUTURE) ×2 IMPLANT
SUT PROLENE 5 0 C 1 24 (SUTURE) ×2 IMPLANT
SUT PROLENE 6 0 BV (SUTURE) ×2 IMPLANT
SUT VIC AB 3-0 SH 27 (SUTURE) ×2
SUT VIC AB 3-0 SH 27X BRD (SUTURE) ×1 IMPLANT
TOWEL GREEN STERILE (TOWEL DISPOSABLE) ×2 IMPLANT
UNDERPAD 30X36 HEAVY ABSORB (UNDERPADS AND DIAPERS) ×2 IMPLANT
WATER STERILE IRR 1000ML POUR (IV SOLUTION) ×2 IMPLANT

## 2021-04-09 NOTE — Discharge Instructions (Signed)
? ?[facility] ? ?Discharge Instructions ? ?AV Fistula or Graft Surgery for Dialysis Access ? ?Please refer to the following instructions for your post-procedure care. Your surgeon or physician assistant will discuss any changes with you. ? ?Activity ? ?You may drive the day following your surgery, if you are comfortable and no longer taking prescription pain medication. Resume full activity as the soreness in your incision resolves. ? ?Bathing/Showering ? ?You may shower after you go home. Keep your incision dry for 48 hours. Do not soak in a bathtub, hot tub, or swim until the incision heals completely. You may not shower if you have a hemodialysis catheter. ? ?Incision Care ? ?Clean your incision with mild soap and water after 48 hours. Pat the area dry with a clean towel. You do not need a bandage unless otherwise instructed. Do not apply any ointments or creams to your incision. You may have skin glue on your incision. Do not peel it off. It will come off on its own in about one week. Your arm may swell a bit after surgery. To reduce swelling use pillows to elevate your arm so it is above your heart. Your doctor will tell you if you need to lightly wrap your arm with an ACE bandage. ? ?Diet ? ?Resume your normal diet. There are not special food restrictions following this procedure. In order to heal from your surgery, it is CRITICAL to get adequate nutrition. Your body requires vitamins, minerals, and protein. Vegetables are the best source of vitamins and minerals. Vegetables also provide the perfect balance of protein. Processed food has little nutritional value, so try to avoid this. ? ?Medications ? ?Resume taking all of your medications. If your incision is causing pain, you may take over-the counter pain relievers such as acetaminophen (Tylenol). If you were prescribed a stronger pain medication, please be aware these medications can cause nausea and constipation. Prevent  nausea by taking the medication with a snack or meal. Avoid constipation by drinking plenty of fluids and eating foods with high amount of fiber, such as fruits, vegetables, and grains.  ?Do not take Tylenol if you are taking prescription pain medications. ? ?Follow up ?Your surgeon may want to see you in the office following your access surgery. If so, this will be arranged at the time of your surgery. ? ?Please call us immediately for any of the following conditions: ? ?Increased pain, redness, drainage (pus) from your incision site ?Fever of 101 degrees or higher ?Severe or worsening pain at your incision site ?Hand pain or numbness. ? ?Reduce your risk of vascular disease: ? ?Stop smoking. If you would like help, call [facility] at 1-800-QUIT-NOW (937) 884-4943) or [place] at (707)566-9973 ? ?Manage your cholesterol ?Maintain a desired weight ?Control your diabetes ?Keep your blood pressure down ? ?Dialysis ? ?It will take several weeks to several months for your new dialysis access to be ready for use. Your surgeon will determine when it is okay to use it. Your nephrologist will continue to direct your dialysis. You can continue to use your Permcath until your new access is ready for use. ? ? ?04/09/2021 ?Javier Jenkins ?[id number] ?January 18, 1951 ? ?Surgeon(s): ?Angelia Mould, MD ? ?Procedure(s): ?PLICATION OF LEFT ARTERIOVENOUS FISTULA ? ? ?x May stick graft on designated area only:  ?Do NOT stick over incision for 8 weeks. ?Okay to stick above and below incision now. ?SEE DIAGRAM.  ? ?If you have any questions, please call the office  at [phone]. ? ?

## 2021-04-09 NOTE — Anesthesia Preprocedure Evaluation (Addendum)
Anesthesia Evaluation  ?Patient identified by MRN, date of birth, ID band ?Patient awake ? ? ? ?Reviewed: ?Allergy & Precautions, NPO status , Patient's Chart, lab work & pertinent test results, reviewed documented beta blocker date and time  ? ?History of Anesthesia Complications ?Negative for: history of anesthetic complications ? ?Airway ?Mallampati: II ? ?TM Distance: >3 FB ?Neck ROM: Full ? ? ? Dental ? ?(+) Edentulous Upper, Edentulous Lower ?  ?Pulmonary ?neg pulmonary ROS, former smoker,  ?  ?Pulmonary exam normal ?breath sounds clear to auscultation ? ? ? ? ? ? Cardiovascular ?hypertension, Pt. on medications ?Normal cardiovascular exam ?Rhythm:Regular Rate:Normal ? ?Aneurysm left upper arm AV Fistula ? ?'21 Myocardial Perfusion - No evidence of any significant ischemia or scar  ?Left ventricular systolic function is normal. Post stress the ejection fraction is > 60%.  ?Mild coronary calcifications are noted  ? ? 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  ?Neuro/Psych ?Seizures -, Well Controlled,  Residual right hemiparersis ?CVA, Residual Symptoms negative psych ROS  ? GI/Hepatic ?negative GI ROS, Neg liver ROS,   ?Endo/Other  ?diabetes, Well Controlled, Type 2Hypothyroidism Obesity ? Renal/GU ?ESRF and DialysisRenal diseaseLast dialysis- 3/11  ?negative genitourinary ?  ?Musculoskeletal ?negative musculoskeletal ROS ?(+)  ? Abdominal ?(+) + obese,   ?Peds ? Hematology ? ?(+) Blood dyscrasia, anemia , Diffuse large B-cell lymphoma   ?Anesthesia Other Findings ? ? Reproductive/Obstetrics ? ?  ? ? ? ? ? ? ? ? ? ? ? ? ? ?  ?  ? ? ? ? ? ? ? ?Anesthesia Physical ? ?Anesthesia Plan ? ?ASA: 4 ? ?Anesthesia Plan: General  ? ?Post-op Pain Management: Tylenol PO (pre-op) and Minimal or no pain anticipated  ? ?Induction: Intravenous ? ?PONV Risk Score and Plan: 2 and Treatment may vary due to age or medical condition, Ondansetron and Dexamethasone ? ?Airway  Management Planned: LMA ? ?Additional Equipment: None ? ?Intra-op Plan:  ? ?Post-operative Plan: Extubation in OR ? ?Informed Consent: I have reviewed the patients History and Physical, chart, labs and discussed the procedure including the risks, benefits and alternatives for the proposed anesthesia with the patient or authorized representative who has indicated his/her understanding and acceptance.  ? ? ? ?Dental advisory given ? ?Plan Discussed with: CRNA and Anesthesiologist ? ?Anesthesia Plan Comments:   ? ? ? ? ? ? ?Anesthesia Quick Evaluation ? ?

## 2021-04-09 NOTE — Anesthesia Postprocedure Evaluation (Signed)
Anesthesia Post Note ? ?Patient: Javier Jenkins ? ?Procedure(s) Performed: PLICATION OF LEFT ARTERIOVENOUS FISTULA (Left) ? ?  ? ?Patient location during evaluation: PACU ?Anesthesia Type: General ?Level of consciousness: awake and alert and oriented ?Pain management: pain level controlled ?Vital Signs Assessment: post-procedure vital signs reviewed and stable ?Respiratory status: spontaneous breathing, nonlabored ventilation and respiratory function stable ?Cardiovascular status: blood pressure returned to baseline and stable ?Postop Assessment: no apparent nausea or vomiting ?Anesthetic complications: no ? ? ?No notable events documented. ? ?Last Vitals:  ?Vitals:  ? 04/09/21 1059 04/09/21 1113  ?BP: 95/66 98/79  ?Pulse: (!) 56 (!) 54  ?Resp: 15 11  ?Temp:  36.4 ?C  ?SpO2: 95% 96%  ?  ?Last Pain:  ?Vitals:  ? 04/09/21 1113  ?TempSrc:   ?PainSc: 0-No pain  ? ? ?  ?  ?  ?  ?  ?  ? ?Jonus Coble A. ? ? ? ? ?

## 2021-04-09 NOTE — Transfer of Care (Signed)
Immediate Anesthesia Transfer of Care Note ? ?Patient: Javier Jenkins ? ?Procedure(s) Performed: PLICATION OF LEFT ARTERIOVENOUS FISTULA (Left) ? ?Patient Location: PACU ? ?Anesthesia Type:General ? ?Level of Consciousness: drowsy and patient cooperative ? ?Airway & Oxygen Therapy: Patient Spontanous Breathing ? ?Post-op Assessment: Report given to RN and Post -op Vital signs reviewed and stable ? ?Post vital signs: Reviewed and stable ? ?Last Vitals:  ?Vitals Value Taken Time  ?BP 125/82 04/09/21 1043  ?Temp    ?Pulse 55 04/09/21 1044  ?Resp 16 04/09/21 1044  ?SpO2 97 % 04/09/21 1044  ?Vitals shown include unvalidated device data. ? ?Last Pain:  ?Vitals:  ? 04/09/21 0727  ?TempSrc:   ?PainSc: 0-No pain  ?   ? ?  ? ?Complications: No notable events documented. ?

## 2021-04-09 NOTE — Anesthesia Procedure Notes (Signed)
Procedure Name: LMA Insertion ?Date/Time: 04/09/2021 9:26 AM ?Performed by: Georgia Duff, CRNA ?Pre-anesthesia Checklist: Patient identified, Emergency Drugs available, Suction available and Patient being monitored ?Patient Re-evaluated:Patient Re-evaluated prior to induction ?Oxygen Delivery Method: Circle System Utilized ?Preoxygenation: Pre-oxygenation with 100% oxygen ?Induction Type: IV induction ?Ventilation: Mask ventilation without difficulty ?LMA: LMA inserted ?LMA Size: 4.0 ?Number of attempts: 1 ?Airway Equipment and Method: Bite block ?Placement Confirmation: positive ETCO2 ?Tube secured with: Tape ?Dental Injury: Teeth and Oropharynx as per pre-operative assessment  ? ? ? ? ?

## 2021-04-09 NOTE — Interval H&P Note (Signed)
History and Physical Interval Note: ? ?04/09/2021 ?7:46 AM ? ?Javier Jenkins  has presented today for surgery, with the diagnosis of ESRD.  The various methods of treatment have been discussed with the patient and family. After consideration of risks, benefits and other options for treatment, the patient has consented to  Procedure(s): ?PLICATION OF LEFT ARTERIOVENOUS FISTULA (Left) as a surgical intervention.  The patient's history has been reviewed, patient examined, no change in status, stable for surgery.  I have reviewed the patient's chart and labs.  Questions were answered to the patient's satisfaction.   ? ? ?[name] ? ? ?

## 2021-04-09 NOTE — Op Note (Signed)
? ? ?  NAME: [name]    MRN: [id number] ?DOB: 08-Apr-1950    DATE OF OPERATION: 04/09/2021 ? ?PREOP DIAGNOSIS:   ? 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 ?POSTOP DIAGNOSIS:   ? ?Same ? ?PROCEDURE:  ?  ?Plication left upper arm aneurysm ? ?SURGEON: Judeth Cornfield. Scot Dock, MD ? ?ASSIST: Liana Crocker, PA ? ?ANESTHESIA: General ? ?EBL: Minimal ? ?INDICATIONS:  ? ? Javier Jenkins is a 71 y.o. male who had 2 large aneurysms on his upper arm fistula.  The more central 1 had already been addressed.  He presents for staged plication of the more peripheral aneurysm.  This was quite large as documented in the photographs from his office note. ? ?FINDINGS:  ? ?Good thrill at the completion of the procedure with the radial and ulnar signal with the Doppler.  I did try to protect the anastomosis by tacking the fistula to roll the anastomosis away from the skin. ? ?TECHNIQUE:  ? ?The patient was taken to the operating room and received a general anesthetic.  The left arm was prepped and draped in usual sterile fashion.  I marked the aneurysm with an elliptical incision that was 3 times the length as the width.  The skin was infiltrated with 1% lidocaine with epinephrine.  The incision was made with a 15 blade and the dissection was performed circumferentially around the aneurysm. Given the complexity of the case a first assistant was necessary in order to expedient the procedure and safely perform the technical aspects of the operation.  The assistant rolled the aneurysm medially and laterally to allow circumferential dissection of the aneurysm.  Patient was then heparinized.  The aneurysm was clamped proximally and distally.  I excised a large segment of the aneurysm trying to preserve the anterior wall as best possible.  The vein was then sewn longitudinally with running 5-0 Prolene suture.  The assistant followed the suture.  At the completion the clamps were released after the anastomosis was tied and there was a  good thrill in the fistula and a radial and ulnar signal with the Doppler.  I then placed 3 tacking sutures to slightly rolled the anastomosis to protect it so that ultimately this segment could be cannulated.  Hemostasis was obtained in the wound.  The wound was closed with a deep layer of 3-0 Vicryl and the skin closed with 4-0 Monocryl.  Dermabond was applied.  The patient tolerated the procedure well was transferred to recovery room in stable condition.  All needle and sponge counts were correct. ? ?[name], MD, FACS ?[facility] ? ?DATE OF DICTATION:   04/09/2021 ? ?

## 2021-04-10 ENCOUNTER — Encounter (HOSPITAL_COMMUNITY): Payer: Self-pay | Admitting: Vascular Surgery

## 2021-04-11 LAB — POCT I-STAT, CHEM 8
BUN: 61 mg/dL — ABNORMAL HIGH (ref 8–23)
Calcium, Ion: 0.78 mmol/L — CL (ref 1.15–1.40)
Chloride: 99 mmol/L (ref 98–111)
Creatinine, Ser: 11 mg/dL — ABNORMAL HIGH (ref 0.61–1.24)
Glucose, Bld: 88 mg/dL (ref 70–99)
HCT: 43 % (ref 39.0–52.0)
Hemoglobin: 14.6 g/dL (ref 13.0–17.0)
Potassium: 8.3 mmol/L (ref 3.5–5.1)
Sodium: 133 mmol/L — ABNORMAL LOW (ref 135–145)
TCO2: 34 mmol/L — ABNORMAL HIGH (ref 22–32)

## 2021-05-16 ENCOUNTER — Other Ambulatory Visit: Payer: Self-pay | Admitting: Nurse Practitioner

## 2021-05-29 DIAGNOSIS — Z992 Dependence on renal dialysis: Secondary | ICD-10-CM | POA: Diagnosis not present

## 2021-05-29 DIAGNOSIS — N186 End stage renal disease: Secondary | ICD-10-CM | POA: Diagnosis not present

## 2021-05-29 DIAGNOSIS — N2581 Secondary hyperparathyroidism of renal origin: Secondary | ICD-10-CM | POA: Diagnosis not present

## 2021-07-25 ENCOUNTER — Ambulatory Visit (INDEPENDENT_AMBULATORY_CARE_PROVIDER_SITE_OTHER): Payer: Medicare (Managed Care) | Admitting: Nurse Practitioner

## 2021-07-25 ENCOUNTER — Encounter: Payer: Self-pay | Admitting: Nurse Practitioner

## 2021-07-25 VITALS — BP 128/70 | HR 62 | Temp 98.1°F | Ht 71.0 in | Wt 205.0 lb

## 2021-07-25 DIAGNOSIS — Z6828 Body mass index (BMI) 28.0-28.9, adult: Secondary | ICD-10-CM

## 2021-07-25 DIAGNOSIS — E039 Hypothyroidism, unspecified: Secondary | ICD-10-CM | POA: Diagnosis not present

## 2021-07-25 DIAGNOSIS — Z0000 Encounter for general adult medical examination without abnormal findings: Secondary | ICD-10-CM

## 2021-07-25 DIAGNOSIS — Z298 Encounter for other specified prophylactic measures: Secondary | ICD-10-CM

## 2021-07-25 DIAGNOSIS — I120 Hypertensive chronic kidney disease with stage 5 chronic kidney disease or end stage renal disease: Secondary | ICD-10-CM | POA: Diagnosis not present

## 2021-07-25 DIAGNOSIS — I700 Atherosclerosis of aorta: Secondary | ICD-10-CM

## 2021-07-25 DIAGNOSIS — Z992 Dependence on renal dialysis: Secondary | ICD-10-CM

## 2021-07-25 DIAGNOSIS — N186 End stage renal disease: Secondary | ICD-10-CM | POA: Diagnosis not present

## 2021-07-25 DIAGNOSIS — I69351 Hemiplegia and hemiparesis following cerebral infarction affecting right dominant side: Secondary | ICD-10-CM

## 2021-07-25 MED ORDER — LEVETIRACETAM 500 MG PO TABS: 500.0000 mg | ORAL_TABLET | Freq: Every day | ORAL | 1 refills | Status: DC

## 2021-07-25 NOTE — Patient Instructions (Signed)
Health Maintenance, Male Adopting a healthy lifestyle and getting preventive care are important in promoting health and wellness. Ask your health care provider about: The right schedule for you to have regular tests and exams. Things you can do on your own to prevent diseases and keep yourself healthy. What should I know about diet, weight, and exercise? Eat a healthy diet  Eat a diet that includes plenty of vegetables, fruits, low-fat dairy products, and lean protein. Do not eat a lot of foods that are high in solid fats, added sugars, or sodium. Maintain a healthy weight Body mass index (BMI) is a measurement that can be used to identify possible weight problems. It estimates body fat based on height and weight. Your health care provider can help determine your BMI and help you achieve or maintain a healthy weight. Get regular exercise Get regular exercise. This is one of the most important things you can do for your health. Most adults should: Exercise for at least 150 minutes each week. The exercise should increase your heart rate and make you sweat (moderate-intensity exercise). Do strengthening exercises at least twice a week. This is in addition to the moderate-intensity exercise. Spend less time sitting. Even light physical activity can be beneficial. Watch cholesterol and blood lipids Have your blood tested for lipids and cholesterol at 73 years of age, then have this test every 5 years. You may need to have your cholesterol levels checked more often if: Your lipid or cholesterol levels are high. You are older than 73 years of age. You are at high risk for heart disease. What should I know about cancer screening? Many types of cancers can be detected early and may often be prevented. Depending on your health history and family history, you may need to have cancer screening at various ages. This may include screening for: Colorectal cancer. Prostate cancer. Skin cancer. Lung  cancer. What should I know about heart disease, diabetes, and high blood pressure? Blood pressure and heart disease High blood pressure causes heart disease and increases the risk of stroke. This is more likely to develop in people who have high blood pressure readings or are overweight. Talk with your health care provider about your target blood pressure readings. Have your blood pressure checked: Every 3-5 years if you are 18-39 years of age. Every year if you are 40 years old or older. If you are between the ages of 65 and 75 and are a current or former smoker, ask your health care provider if you should have a one-time screening for abdominal aortic aneurysm (AAA). Diabetes Have regular diabetes screenings. This checks your fasting blood sugar level. Have the screening done: Once every three years after age 45 if you are at a normal weight and have a low risk for diabetes. More often and at a younger age if you are overweight or have a high risk for diabetes. What should I know about preventing infection? Hepatitis B If you have a higher risk for hepatitis B, you should be screened for this virus. Talk with your health care provider to find out if you are at risk for hepatitis B infection. Hepatitis C Blood testing is recommended for: Everyone born from 1945 through 1965. Anyone with known risk factors for hepatitis C. Sexually transmitted infections (STIs) You should be screened each year for STIs, including gonorrhea and chlamydia, if: You are sexually active and are younger than 73 years of age. You are older than 73 years of age and your   health care provider tells you that you are at risk for this type of infection. Your sexual activity has changed since you were last screened, and you are at increased risk for chlamydia or gonorrhea. Ask your health care provider if you are at risk. Ask your health care provider about whether you are at high risk for HIV. Your health care provider  may recommend a prescription medicine to help prevent HIV infection. If you choose to take medicine to prevent HIV, you should first get tested for HIV. You should then be tested every 3 months for as long as you are taking the medicine. Follow these instructions at home: Alcohol use Do not drink alcohol if your health care provider tells you not to drink. If you drink alcohol: Limit how much you have to 0-2 drinks a day. Know how much alcohol is in your drink. In the [place] one drink equals one 12 oz bottle of beer (355 mL), one 5 oz glass of wine (148 mL), or one 1 oz glass of hard liquor (44 mL). Lifestyle Do not use any products that contain nicotine or tobacco. These products include cigarettes, chewing tobacco, and vaping devices, such as e-cigarettes. If you need help quitting, ask your health care provider. Do not use street drugs. Do not share needles. Ask your health care provider for help if you need support or information about quitting drugs. General instructions Schedule regular health, dental, and eye exams. Stay current with your vaccines. Tell your health care provider if: You often feel depressed. You have ever been abused or do not feel safe at home. Summary Adopting a healthy lifestyle and getting preventive care are important in promoting health and wellness. Follow your health care provider's instructions about healthy diet, exercising, and getting tested or screened for diseases. Follow your health care provider's instructions on monitoring your cholesterol and blood pressure. This information is not intended to replace advice given to you by your health care provider. Make sure you discuss any questions you have with your health care provider. Document Revised: 06/05/2020 Document Reviewed: 06/05/2020 Elsevier Patient Education  2023 Elsevier Inc.  

## 2021-07-25 NOTE — Progress Notes (Signed)
I,Victoria T Hamilton,acting as a Education administrator for [name], FNP.,have documented all relevant documentation on the behalf of [name], FNP,as directed by  [name], FNP while in the presence of [name], [place].   This visit occurred during the SARS-CoV-2 public health emergency.  Safety protocols were in place, including screening questions prior to the visit, additional usage of staff PPE, and extensive cleaning of exam room while observing appropriate contact time as indicated for disinfecting solutions.  Subjective:     Patient ID: Javier Jenkins , male    DOB: 1950-10-18 , 71 y.o.   MRN: [id number]   Chief Complaint  Patient presents with   Annual Exam    HPI  Here for HM. Continues with T, Th and Sat hemodialysis, does not urinate at all. Reports having a subdural hematoma and had seizures after in 2017 had evacuation but never followed by neurology. Has been taking keppra once a day since that time.     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   Stroke [facility]) 1998   right side paralysis     Family History  Problem Relation Age of Onset   Hypertension Mother    Cancer Paternal Uncle        prostate ca     Current Outpatient Medications:    aspirin 325 MG tablet, Take 1 tablet (325 mg total) by mouth daily., Disp: 30 tablet, Rfl: 0   atorvastatin (LIPITOR) 10 MG tablet, TAKE 1  TABLET BY MOUTH EVERY DAY (Patient taking differently: Take 10 mg by mouth every evening.), Disp: 90 tablet, Rfl: 1   cinacalcet (SENSIPAR) 60 MG tablet, Take 60 mg by mouth every evening., Disp: , Rfl:    clobetasol ointment (TEMOVATE) 4.78 %, Apply 1 application. topically 2 (two) times daily as needed (skin irritation)., Disp: , Rfl:    ferric citrate (AURYXIA) 1 GM 210 MG(Fe) tablet, Take 210-630 mg by mouth See admin instructions. Take 3 tablets (630 mg) by mouth with each meal & take 1-2 tablets (210 mg-420 mg) by mouth with each snack, Disp: , Rfl:    levothyroxine (SYNTHROID) 25 MCG tablet, TAKE 1 TABLET BY MOUTH EVERY DAY, Disp: 90 tablet, Rfl: 1   metoprolol tartrate (LOPRESSOR) 100 MG tablet, TAKE 1 TABLET BY MOUTH TWICE A DAY, Disp: 180 tablet, Rfl: 0   multivitamin (RENA-VIT) TABS tablet, Take 1 tablet by mouth at bedtime., Disp: 90 tablet, Rfl: 0   levETIRAcetam (KEPPRA) 500 MG tablet, Take 1 tablet (500 mg total) by mouth daily., Disp: 90 tablet, Rfl: 1   oxyCODONE (ROXICODONE) 5 MG immediate release tablet, Take 1 tablet (5 mg total) by mouth every 4 (four) hours as needed., Disp: 10 tablet, Rfl: 0   Allergies  Allergen Reactions  Tape Hives    Paper tape and adhesives     Men's preventive visit. Patient Health Questionnaire (PHQ-2) is  Flowsheet Row Clinical Support from 11/01/2020 in [facility]  PHQ-2 Total Score 0      Patient is on a renal diet. Exercises minimally. Marital status: Married. Relevant history for alcohol use is:  Social History   Substance and Sexual Activity  Alcohol Use No   Alcohol/week: 0.0 standard drinks of alcohol   Relevant history for tobacco use is:  Social History   Tobacco Use  Smoking Status Former   Types: Pipe   Quit date: 09/22/1984   Years since quitting: 36.9  Smokeless Tobacco Never  .   Review of Systems  Constitutional: Negative.   HENT: Negative.    Cardiovascular: Negative.   Gastrointestinal:  Negative.   Endocrine: Negative.   Genitourinary: Negative.   Musculoskeletal: Negative.   Skin: Negative.   Allergic/Immunologic: Negative.   Hematological: Negative.      Today's Vitals   07/25/21 1412  BP: 128/70  Pulse: 62  Temp: 98.1 F (36.7 C)  SpO2: 96%  Weight: 205 lb (93 kg)  Height: '5\' 11"'  (1.803 m)  PainSc: 0-No pain   Body mass index is 28.59 kg/m.  Wt Readings from Last 3 Encounters:  07/25/21 205 lb (93 kg)  04/09/21 203 lb (92.1 kg)  04/05/21 203 lb (92.1 kg)    Objective:  Physical Exam Vitals reviewed.  Constitutional:      General: He is not in acute distress.    Appearance: Normal appearance. He is obese.  HENT:     Head: Normocephalic and atraumatic.     Right Ear: Tympanic membrane, ear canal and external ear normal. There is no impacted cerumen.     Left Ear: Tympanic membrane, ear canal and external ear normal. There is no impacted cerumen.     Nose: Nose normal.     Mouth/Throat:     Mouth: Mucous membranes are moist.  Eyes:     Extraocular Movements: Extraocular movements intact.     Conjunctiva/sclera: Conjunctivae normal.     Pupils: Pupils are equal, round, and reactive to light.  Cardiovascular:     Rate and Rhythm: Normal rate and regular rhythm.     Pulses: Normal pulses.     Heart sounds: Normal heart sounds. No murmur heard. Pulmonary:     Effort: Pulmonary effort is normal. No respiratory distress.     Breath sounds: Normal breath sounds. No wheezing.  Abdominal:     General: Abdomen is flat. Bowel sounds are normal. There is no distension.     Palpations: Abdomen is soft.     Tenderness: There is no abdominal tenderness.  Musculoskeletal:        General: Normal range of motion.     Cervical back: Normal range of motion and neck supple.     Comments: Right leg and right arm hemiparesis with brace in place to hand  Skin:    General: Skin is warm and dry.     Capillary Refill: Capillary refill takes less than 2 seconds.   Neurological:     General: No focal deficit present.     Mental Status: He is alert and oriented to person, place, and time.     Cranial Nerves: No cranial nerve deficit.     Motor: No weakness.  Psychiatric:        Mood and Affect: Mood normal.  Behavior: Behavior normal.        Thought Content: Thought content normal.        Judgment: Judgment normal.         Assessment And Plan:    1. Encounter for general medical examination Behavior modifications discussed and diet history reviewed.   Pt will continue to exercise regularly and modify diet with low GI, plant based foods and decrease intake of processed foods.  Recommend intake of daily multivitamin, Vitamin D, and calcium.  Recommend colonoscopy for preventive screenings, as well as recommend immunizations that include influenza, TDAP, and Shingles  2. Body mass index (BMI) of 28.0-28.9 in adult  3. Benign hypertension with end-stage renal disease (HCC) Comments: Blood pressure is well controlled, continue current medications - EKG 12-Lead - CMP14+EGFR - CBC - Lipid panel  4. Atherosclerosis of aorta (HCC) Comments: Stable, continue statin, tolerating well.   5. Acquired hypothyroidism Comments: Stable, continue current medications. - EKG 12-Lead - CMP14+EGFR - CBC - Lipid panel - T3, free - T4 - TSH  6. Hemiparesis affecting right side as late effect of stroke ([place])  7. Seizure prophylaxis Comments: No seizure activity - levETIRAcetam (KEPPRA) 500 MG tablet; Take 1 tablet (500 mg total) by mouth daily.  Dispense: 90 tablet; Refill: 1  8. Dependence on hemodialysis [facility]) Comments: Doing well.      Patient was given opportunity to ask questions. Patient verbalized understanding of the plan and was able to repeat key elements of the plan. All questions were answered to their satisfaction.   [name], FNP    I, [name], FNP, have reviewed all documentation for this visit. The documentation  on 07/25/21 for the exam, diagnosis, procedures, and orders are all accurate and complete.  THE PATIENT IS ENCOURAGED TO PRACTICE SOCIAL DISTANCING DUE TO THE COVID-19 PANDEMIC.

## 2021-07-26 LAB — LIPID PANEL
Chol/HDL Ratio: 4.7 ratio (ref 0.0–5.0)
Cholesterol, Total: 199 mg/dL (ref 100–199)
HDL: 42 mg/dL (ref >39)
LDL Chol Calc (NIH): 102 mg/dL — ABNORMAL HIGH (ref 0–99)
Triglycerides: 323 mg/dL — ABNORMAL HIGH (ref 0–149)
VLDL Cholesterol Cal: 55 mg/dL — ABNORMAL HIGH (ref 5–40)

## 2021-07-26 LAB — TSH: TSH: 3.42 u[IU]/mL (ref 0.450–4.500)

## 2021-07-26 LAB — CMP14+EGFR
ALT: 14 IU/L (ref 0–44)
AST: 17 IU/L (ref 0–40)
Albumin/Globulin Ratio: 1.4 (ref 1.2–2.2)
Albumin: 4.5 g/dL (ref 3.7–4.7)
Alkaline Phosphatase: 120 IU/L (ref 44–121)
BUN/Creatinine Ratio: 4 — ABNORMAL LOW (ref 10–24)
BUN: 38 mg/dL — ABNORMAL HIGH (ref 8–27)
Bilirubin Total: 0.2 mg/dL (ref 0.0–1.2)
CO2: 26 mmol/L (ref 20–29)
Calcium: 8.3 mg/dL — ABNORMAL LOW (ref 8.6–10.2)
Chloride: 94 mmol/L — ABNORMAL LOW (ref 96–106)
Creatinine, Ser: 8.6 mg/dL — ABNORMAL HIGH (ref 0.76–1.27)
Globulin, Total: 3.2 g/dL (ref 1.5–4.5)
Glucose: 89 mg/dL (ref 70–99)
Potassium: 5 mmol/L (ref 3.5–5.2)
Sodium: 138 mmol/L (ref 134–144)
Total Protein: 7.7 g/dL (ref 6.0–8.5)
eGFR: 6 mL/min/{1.73_m2} — ABNORMAL LOW (ref >59)

## 2021-07-26 LAB — CBC
Hematocrit: 37.3 % — ABNORMAL LOW (ref 37.5–51.0)
Hemoglobin: 12.3 g/dL — ABNORMAL LOW (ref 13.0–17.7)
MCH: 31.7 pg (ref 26.6–33.0)
MCHC: 33 g/dL (ref 31.5–35.7)
MCV: 96 fL (ref 79–97)
Platelets: 260 10*3/uL (ref 150–450)
RBC: 3.88 x10E6/uL — ABNORMAL LOW (ref 4.14–5.80)
RDW: 13.5 % (ref 11.6–15.4)
WBC: 8 10*3/uL (ref 3.4–10.8)

## 2021-07-26 LAB — T3, FREE: T3, Free: 2.3 pg/mL (ref 2.0–4.4)

## 2021-07-26 LAB — T4: T4, Total: 6.3 ug/dL (ref 4.5–12.0)

## 2021-08-26 ENCOUNTER — Other Ambulatory Visit: Payer: Self-pay | Admitting: Nurse Practitioner

## 2021-08-26 DIAGNOSIS — R7303 Prediabetes: Secondary | ICD-10-CM

## 2021-09-03 ENCOUNTER — Telehealth: Payer: Self-pay | Admitting: Nurse Practitioner

## 2021-09-03 NOTE — Telephone Encounter (Signed)
Left message for patient to call back and schedule Medicare Annual Wellness Visit (AWV) either virtually or in office.  Left both my jabber number [phone] and office number    Last AWV ;11/01/20  please schedule at anytime with [facility]

## 2021-09-12 DIAGNOSIS — J209 Acute bronchitis, unspecified: Secondary | ICD-10-CM | POA: Diagnosis not present

## 2021-11-08 ENCOUNTER — Telehealth: Payer: Self-pay

## 2021-11-08 NOTE — Telephone Encounter (Signed)
This nurse called patient for scheduled telephonic AWV. Person that answered phone stated that he was at dialysis. Told her that we will call again to reschedule for another time.

## 2021-11-14 ENCOUNTER — Telehealth: Payer: Self-pay

## 2021-11-14 NOTE — Telephone Encounter (Signed)
This nurse called patient for telephonic AWV. Person that answered phone stated that he was not home.

## 2021-11-20 ENCOUNTER — Telehealth: Payer: Self-pay | Admitting: Nurse Practitioner

## 2021-11-20 NOTE — Telephone Encounter (Signed)
Left message for patient to call back and schedule Medicare Annual Wellness Visit (AWV) either virtually or in office. Left  my jabber number [phone]   Last AWV  11/01/20 please schedule with Nurse Health Adviser   45 min for awv-i and in office appointments 30 min for awv-s  phone/virtual appointments  

## 2021-11-25 ENCOUNTER — Other Ambulatory Visit: Payer: Self-pay | Admitting: Nurse Practitioner

## 2022-01-10 ENCOUNTER — Other Ambulatory Visit: Payer: Self-pay | Admitting: Nurse Practitioner

## 2022-01-10 DIAGNOSIS — Z2989 Encounter for other specified prophylactic measures: Secondary | ICD-10-CM

## 2022-01-29 DIAGNOSIS — N186 End stage renal disease: Secondary | ICD-10-CM | POA: Diagnosis not present

## 2022-01-29 DIAGNOSIS — Z992 Dependence on renal dialysis: Secondary | ICD-10-CM | POA: Diagnosis not present

## 2022-01-29 DIAGNOSIS — N2581 Secondary hyperparathyroidism of renal origin: Secondary | ICD-10-CM | POA: Diagnosis not present

## 2022-01-29 DIAGNOSIS — E119 Type 2 diabetes mellitus without complications: Secondary | ICD-10-CM | POA: Diagnosis not present

## 2022-01-30 ENCOUNTER — Ambulatory Visit (INDEPENDENT_AMBULATORY_CARE_PROVIDER_SITE_OTHER): Payer: Medicare (Managed Care)

## 2022-01-30 ENCOUNTER — Ambulatory Visit: Payer: Medicare (Managed Care) | Admitting: Nurse Practitioner

## 2022-01-30 VITALS — BP 130/60 | HR 64 | Temp 98.2°F | Ht 68.6 in | Wt 202.6 lb

## 2022-01-30 DIAGNOSIS — Z0000 Encounter for general adult medical examination without abnormal findings: Secondary | ICD-10-CM | POA: Diagnosis not present

## 2022-01-30 NOTE — Progress Notes (Signed)
Subjective:   COLONEL KRAUSER is a 72 y.o. male who presents for Medicare Annual/Subsequent preventive examination.  Review of Systems     Cardiac Risk Factors include: advanced age (>53mn, >>44women);hypertension;male gender;obesity (BMI >30kg/m2)     Objective:    Today's Vitals   01/30/22 1528  BP: 130/60  Pulse: 64  Temp: 98.2 F (36.8 C)  TempSrc: Oral  SpO2: 99%  Weight: 202 lb 9.6 oz (91.9 kg)  Height: 5' 8.6" (1.742 m)   Body mass index is 30.27 kg/m.     01/30/2022    3:39 PM 01/01/2021    6:30 AM 11/01/2020    2:30 PM 08/19/2020    6:25 PM 01/12/2020    3:49 PM 11/24/2018    2:22 PM 08/28/2018   10:17 AM  Advanced Directives  Does Patient Have a Medical Advance Directive? No No No No No No No  Would patient like information on creating a medical advance directive? No - Patient declined No - Patient declined     No - Patient declined    Current Medications (verified) Outpatient Encounter Medications as of 01/30/2022  Medication Sig   aspirin 325 MG tablet Take 1 tablet (325 mg total) by mouth daily.   atorvastatin (LIPITOR) 10 MG tablet Take 1 tablet (10 mg total) by mouth every evening.   cinacalcet (SENSIPAR) 60 MG tablet Take 60 mg by mouth every evening.   clobetasol ointment (TEMOVATE) 05.63% Apply 1 application. topically 2 (two) times daily as needed (skin irritation).   ferric citrate (AURYXIA) 1 GM 210 MG(Fe) tablet Take 210-630 mg by mouth See admin instructions. Take 3 tablets (630 mg) by mouth with each meal & take 1-2 tablets (210 mg-420 mg) by mouth with each snack   levETIRAcetam (KEPPRA) 500 MG tablet TAKE 1 TABLET (500 MG TOTAL) BY MOUTH DAILY.   levothyroxine (SYNTHROID) 25 MCG tablet TAKE 1 TABLET BY MOUTH EVERY DAY   LOKELMA 10 g PACK packet Take 1 packet by mouth as directed.   metoprolol tartrate (LOPRESSOR) 100 MG tablet TAKE 1 TABLET BY MOUTH TWICE A DAY   multivitamin (RENA-VIT) TABS tablet Take 1 tablet by mouth at bedtime.    oxyCODONE (ROXICODONE) 5 MG immediate release tablet Take 1 tablet (5 mg total) by mouth every 4 (four) hours as needed.   No facility-administered encounter medications on file as of 01/30/2022.    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 ([facility] 06/07/2015   ESRD (end stage renal disease) ([facility]    [facility]   Head injury    had seizures   History of blood transfusion    Hypertension    not on medications- does not have since starting dialysis   Hypothyroidism    Non-Hodgkin lymphoma of intra-abdominal lymph nodes (H[facility]) 06/07/2015   Paralysis ([facility]    Pre-diabetes    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   Seizures ([facility]    after head injury   Stroke ([facility] 1998   right side paralysis   Past Surgical History:  Procedure Laterality Date   AV FISTULA PLACEMENT Left 10/18/2014   Procedure: ARTERIOVENOUS (AV) FISTULA CREATION;  Surgeon: CAngelia Mould MD;  [place]  Service: Vascular;  Laterality:  Left;   BURR HOLE Right 03/17/2015   Procedure: BURR HOLES FOR RIGHT SUBDURAL HEMATOMA;  Surgeon: Leeroy Cha, MD;  Location: [place] [facility];  Service: Neurosurgery;  Laterality: Right;   COLONOSCOPY     FISTULA SUPERFICIALIZATION Left 54/06/2701   Procedure: PLICATION OF LEFT ARTERIOVENOUS FISTULA  PSEUDOANEURYSM;  Surgeon: Angelia Mould, MD;  Location: [place];  Service: Vascular;  Laterality: Left;   FISTULA SUPERFICIALIZATION Left 5/00/9381   Procedure: PLICATION OF LEFT ARTERIOVENOUS FISTULA;  Surgeon: Angelia Mould, MD;  [facility];  Service: Vascular;  Laterality: Left;   HEMORRHOID SURGERY     IR REMOVAL TUN ACCESS W/ PORT W/O FL MOD SED  05/12/2017   REVISON OF ARTERIOVENOUS FISTULA Left 09/04/2018   Procedure: EXCISION OF ULCER ON LEFT ARM  ARTERIOVENOUS FISTULA;  Surgeon: Angelia Mould, MD;  Location: [facility] OR;  Service: Vascular;  Laterality: Left;   Family History  Problem Relation Age of Onset   Hypertension Mother    Cancer Paternal Uncle        prostate ca   Social History   Socioeconomic History   Marital status: Married    Spouse name: Not on file   Number of children: Not on file   Years of education: Not on file   Highest education level: Not on file  Occupational History   Occupation: retired  Tobacco Use   Smoking status: Former    Types: Pipe    Quit date: 09/22/1984    Years since quitting: 37.3   Smokeless tobacco: Never  Vaping Use   Vaping Use: Never used  Substance and Sexual Activity   Alcohol use: No    Alcohol/week: 0.0 standard drinks of alcohol   Drug use: No   Sexual activity: Yes    Comment: pastor, married, 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  Other Topics Concern   Not on file  Social History Narrative   Not on file   Social Determinants of Health   Financial Resource Strain: [place]  (01/30/2022)   Overall Financial Resource Strain (CARDIA)    Difficulty of Paying Living Expenses: Not hard at all  Food Insecurity: No Food Insecurity (01/30/2022)   Hunger Vital Sign    Worried About Running Out of Food in the Last Year: Never true    [place] in the Last Year: Never true  Transportation Needs: No Transportation Needs (01/30/2022)   PRAPARE - Hydrologist (Medical): No    Lack of Transportation (Non-Medical): No  Physical Activity: Insufficiently Active (01/30/2022)   Exercise Vital Sign    Days of Exercise per Week: 3 days    Minutes of Exercise per Session: 20 min  Stress: No Stress Concern Present (01/30/2022)   [place]    Feeling of Stress : Not at all  Social Connections: Not on file    Tobacco Counseling Counseling given: Not Answered   Clinical Intake:  Pre-visit  preparation completed: Yes  Pain : No/denies pain     Nutritional Status: BMI > 30  Obese Nutritional Risks: None Diabetes: No  How often do you need to have someone help you when you read instructions, pamphlets, or other written materials from your doctor or pharmacy?: 1 - Never  Diabetic? no  Interpreter Needed?: No  Information entered by :: NAllen LPN   Activities of Daily Living    01/30/2022    3:40 PM 01/30/2022  12:18 PM  In your present state of health, do you have any difficulty performing the following activities:  Hearing? 0 0  Vision? 0 0  Difficulty concentrating or making decisions? 0 0  Walking or climbing stairs? 1 1  Dressing or bathing? 0 0  Doing errands, shopping? 0 0  Preparing Food and eating ? N N  Using the Toilet? N N  In the past six months, have you accidently leaked urine? N N  Do you have problems with loss of bowel control? N   Managing your Medications? N N  Managing your Finances? N N  Housekeeping or managing your Housekeeping? N N    Patient Care Team: [name], FNP as PCP - General (General Practice) [name], MD (Inactive) as Consulting Physician (Nephrology) Heath Lark, MD as Consulting Physician (Hematology and Oncology) [facility] [place] any recent Medical Services you may have received from other than [facility] providers in the past year (date may be approximate).     Assessment:   This is a routine wellness examination for Kasen.  Hearing/Vision screen Vision Screening - Comments:: Regular eye exams, [facility]  Dietary issues and exercise activities discussed: Current Exercise Habits: Home exercise routine, Type of exercise: walking, Time (Minutes): 20, Frequency (Times/Week): 3, Weekly Exercise (Minutes/Week): 60   Goals Addressed             This Visit's Progress    Patient Stated       01/30/2022, no goals       Depression Screen    01/30/2022    3:39 PM 11/01/2020     2:30 PM 01/12/2020    3:50 PM 04/05/2019    9:54 AM 12/07/2018    9:40 AM 11/24/2018    2:22 PM 07/20/2018    3:50 PM  PHQ 2/9 Scores  PHQ - 2 Score 0 0 0 0 0 0 0  PHQ- 9 Score      0     Fall Risk    01/30/2022    3:39 PM 01/30/2022   12:18 PM 11/01/2020    2:30 PM 01/12/2020    3:50 PM 04/05/2019    9:54 AM  Fall Risk   Falls in the past year? 0  0 0 0  Number falls in past yr: 0 0     Injury with Fall? 0 0     Risk for fall due to : Impaired balance/gait;Medication side effect  Medication side effect Impaired balance/gait;Medication side effect   Follow up Falls prevention discussed;Education provided;Falls evaluation completed  Falls evaluation completed;Education provided;Falls prevention discussed Falls evaluation completed;Education provided;Falls prevention discussed     FALL RISK PREVENTION PERTAINING TO THE HOME:  Any stairs in or around the home? No  If so, are there any without handrails? N/a Home free of loose throw rugs in walkways, pet beds, electrical cords, etc? Yes  Adequate lighting in your home to reduce risk of falls? Yes   ASSISTIVE DEVICES UTILIZED TO PREVENT FALLS:  Life alert? No  Use of a cane, walker or w/c? No  Grab bars in the bathroom? Yes  Shower chair or bench in shower? Yes  Elevated toilet seat or a handicapped toilet? Yes   TIMED UP AND GO:  Was the test performed? Yes .  Length of time to ambulate 10 feet: 6 sec.   Gait slow and steady without use of assistive device  Cognitive Function:        01/30/2022  3:40 PM 11/01/2020    2:31 PM 01/12/2020    3:52 PM 11/24/2018    2:27 PM  6CIT Screen  What Year? 0 points 0 points 0 points 0 points  What month? 0 points 0 points 0 points 0 points  What time? 0 points 0 points 0 points 0 points  Count back from 20 0 points 0 points 0 points 0 points  Months in reverse 0 points 0 points 0 points 0 points  Repeat phrase 0 points 0 points 0 points 0 points  Total Score 0 points 0 points 0  points 0 points    Immunizations Immunization History  Administered Date(s) Administered   Influenza, High Dose Seasonal PF 11/18/2016, 10/22/2018, 10/18/2020   Influenza,inj,Quad PF,6+ Mos 11/28/2015   Influenza-Unspecified 10/28/2019   Moderna Sars-Covid-2 Vaccination 03/11/2019, 04/08/2019, 12/16/2019    TDAP status: Due, Education has been provided regarding the importance of this vaccine. Advised may receive this vaccine at local pharmacy or Health Dept. Aware to provide a copy of the vaccination record if obtained from local pharmacy or Health Dept. Verbalized acceptance and understanding.  Flu Vaccine status: Up to date  Pneumococcal vaccine status: Declined,  Education has been provided regarding the importance of this vaccine but patient still declined. Advised may receive this vaccine at local pharmacy or Health Dept. Aware to provide a copy of the vaccination record if obtained from local pharmacy or Health Dept. Verbalized acceptance and understanding.   Covid-19 vaccine status: Completed vaccines  Qualifies for Shingles Vaccine? Yes   Zostavax completed No   Shingrix Completed?: No.    Education has been provided regarding the importance of this vaccine. Patient has been advised to call insurance company to determine out of pocket expense if they have not yet received this vaccine. Advised may also receive vaccine at local pharmacy or Health Dept. Verbalized acceptance and understanding.  Screening Tests Health Maintenance  Topic Date Due   DTaP/Tdap/Td (1 - Tdap) Never done   Zoster Vaccines- Shingrix (1 of 2) 05/01/2022 (Originally 05/19/1969)   Pneumonia Vaccine 43+ Years old (1 - PCV) 01/31/2023 (Originally 05/19/1956)   Medicare Annual Wellness (AWV)  01/31/2023   COLONOSCOPY (Pts 45-49yr Insurance coverage will need to be confirmed)  01/13/2028   INFLUENZA VACCINE  Completed   Hepatitis C Screening  Completed   HPV VACCINES  Aged Out   COVID-19 Vaccine   Discontinued    Health Maintenance  Health Maintenance Due  Topic Date Due   DTaP/Tdap/Td (1 - Tdap) Never done    Colorectal cancer screening: Type of screening: Colonoscopy. Completed 01/12/2018. Repeat every 5 years  Lung Cancer Screening: (Low Dose CT Chest recommended if Age 74-80years, 30 pack-year currently smoking OR have quit w/in 15years.) does not qualify.   Lung Cancer Screening Referral: no  Additional Screening:  Hepatitis C Screening: does qualify; Completed 02/04/2019  Vision Screening: Recommended annual ophthalmology exams for early detection of glaucoma and other disorders of the eye. Is the patient up to date with their annual eye exam?  Yes  Who is the provider or what is the name of the office in which the patient attends annual eye exams? Dr. GKaty Fitch If pt is not established with a provider, would they like to be referred to a provider to establish care? No .   Dental Screening: Recommended annual dental exams for proper oral hygiene  Community Resource Referral / Chronic Care Management: CRR required this visit?  No   CCM required this  visit?  No      Plan:     I have personally reviewed and noted the following in the patient's chart:   Medical and social history Use of alcohol, tobacco or illicit drugs  Current medications and supplements including opioid prescriptions. Patient is not currently taking opioid prescriptions. Functional ability and status Nutritional status Physical activity Advanced directives List of other physicians Hospitalizations, surgeries, and ER visits in previous 12 months Vitals Screenings to include cognitive, depression, and falls Referrals and appointments  In addition, I have reviewed and discussed with patient certain preventive protocols, quality metrics, and best practice recommendations. A written personalized care plan for preventive services as well as general preventive health recommendations were provided  to patient.     Kellie Simmering, LPN   04/30/5523   Nurse Notes: none

## 2022-01-30 NOTE — Patient Instructions (Signed)
[name] , Thank you for taking time to come for your Medicare Wellness Visit. I appreciate your ongoing commitment to your health goals. Please review the following plan we discussed and let me know if I can assist you in the future.   These are the goals we discussed:  Goals      Patient Stated     11/24/2018, no goals at this time     Patient Stated     01/12/2020, wants kidney transplant     Patient Stated     11/01/2020, no goals     Patient Stated     01/30/2022, no goals        This is a list of the screening recommended for you and due dates:  Health Maintenance  Topic Date Due   DTaP/Tdap/Td vaccine (1 - Tdap) Never done   Zoster (Shingles) Vaccine (1 of 2) 05/01/2022*   Pneumonia Vaccine (1 - PCV) 01/31/2023*   Medicare Annual Wellness Visit  01/31/2023   Colon Cancer Screening  01/13/2028   Flu Shot  Completed   Hepatitis C Screening: USPSTF Recommendation to screen - Ages 18-79 yo.  Completed   HPV Vaccine  Aged Out   COVID-19 Vaccine  Discontinued  *Topic was postponed. The date shown is not the original due date.    Advanced directives: Advance directive discussed with you today. Even though you declined this today please call our office should you change your mind and we can give you the proper paperwork for you to fill out.  Conditions/risks identified: none  Next appointment: Follow up in one year for your annual wellness visit.   Preventive Care 70 Years and Older, Male  Preventive care refers to lifestyle choices and visits with your health care provider that can promote health and wellness. What does preventive care include? A yearly physical exam. This is also called an annual well check. Dental exams once or twice a year. Routine eye exams. Ask your health care provider how often you should have your eyes checked. Personal lifestyle choices, including: Daily care of your teeth and gums. Regular physical activity. Eating a healthy  diet. Avoiding tobacco and drug use. Limiting alcohol use. Practicing safe sex. Taking low doses of aspirin every day. Taking vitamin and mineral supplements as recommended by your health care provider. What happens during an annual well check? The services and screenings done by your health care provider during your annual well check will depend on your age, overall health, lifestyle risk factors, and family history of disease. Counseling  Your health care provider may ask you questions about your: Alcohol use. Tobacco use. Drug use. Emotional well-being. Home and relationship well-being. Sexual activity. Eating habits. History of falls. Memory and ability to understand (cognition). Work and work Statistician. Screening  You may have the following tests or measurements: Height, weight, and BMI. Blood pressure. Lipid and cholesterol levels. These may be checked every 5 years, or more frequently if you are over 76 years old. Skin check. Lung cancer screening. You may have this screening every year starting at age 76 if you have a 30-pack-year history of smoking and currently smoke or have quit within the past 15 years. Fecal occult blood test (FOBT) of the stool. You may have this test every year starting at age 65. Flexible sigmoidoscopy or colonoscopy. You may have a sigmoidoscopy every 5 years or a colonoscopy every 10 years starting at age 11. Prostate cancer screening. Recommendations will vary depending on your family  history and other risks. Hepatitis C blood test. Hepatitis B blood test. Sexually transmitted disease (STD) testing. Diabetes screening. This is done by checking your blood sugar (glucose) after you have not eaten for a while (fasting). You may have this done every 1-3 years. Abdominal aortic aneurysm (AAA) screening. You may need this if you are a current or former smoker. Osteoporosis. You may be screened starting at age 47 if you are at high risk. Talk with  your health care provider about your test results, treatment options, and if necessary, the need for more tests. Vaccines  Your health care provider may recommend certain vaccines, such as: Influenza vaccine. This is recommended every year. Tetanus, diphtheria, and acellular pertussis (Tdap, Td) vaccine. You may need a Td booster every 10 years. Zoster vaccine. You may need this after age 39. Pneumococcal 13-valent conjugate (PCV13) vaccine. One dose is recommended after age 105. Pneumococcal polysaccharide (PPSV23) vaccine. One dose is recommended after age 51. Talk to your health care provider about which screenings and vaccines you need and how often you need them. This information is not intended to replace advice given to you by your health care provider. Make sure you discuss any questions you have with your health care provider. Document Released: 02/10/2015 Document Revised: 10/04/2015 Document Reviewed: 11/15/2014 Elsevier Interactive Patient Education  2017 [employer] Prevention in the Home Falls can cause injuries. They can happen to people of all ages. There are many things you can do to make your home safe and to help prevent falls. What can I do on the outside of my home? Regularly fix the edges of walkways and driveways and fix any cracks. Remove anything that might make you trip as you walk through a door, such as a raised step or threshold. Trim any bushes or trees on the path to your home. Use bright outdoor lighting. Clear any walking paths of anything that might make someone trip, such as rocks or tools. Regularly check to see if handrails are loose or broken. Make sure that both sides of any steps have handrails. Any raised decks and porches should have guardrails on the edges. Have any leaves, snow, or ice cleared regularly. Use sand or salt on walking paths during winter. Clean up any spills in your garage right away. This includes oil or grease spills. What  can I do in the bathroom? Use night lights. Install grab bars by the toilet and in the tub and shower. Do not use towel bars as grab bars. Use non-skid mats or decals in the tub or shower. If you need to sit down in the shower, use a plastic, non-slip stool. Keep the floor dry. Clean up any water that spills on the floor as soon as it happens. Remove soap buildup in the tub or shower regularly. Attach bath mats securely with double-sided non-slip rug tape. Do not have throw rugs and other things on the floor that can make you trip. What can I do in the bedroom? Use night lights. Make sure that you have a light by your bed that is easy to reach. Do not use any sheets or blankets that are too big for your bed. They should not hang down onto the floor. Have a firm chair that has side arms. You can use this for support while you get dressed. Do not have throw rugs and other things on the floor that can make you trip. What can I do in the kitchen? Clean up any  spills right away. Avoid walking on wet floors. Keep items that you use a lot in easy-to-reach places. If you need to reach something above you, use a strong step stool that has a grab bar. Keep electrical cords out of the way. Do not use floor polish or wax that makes floors slippery. If you must use wax, use non-skid floor wax. Do not have throw rugs and other things on the floor that can make you trip. What can I do with my stairs? Do not leave any items on the stairs. Make sure that there are handrails on both sides of the stairs and use them. Fix handrails that are broken or loose. Make sure that handrails are as long as the stairways. Check any carpeting to make sure that it is firmly attached to the stairs. Fix any carpet that is loose or worn. Avoid having throw rugs at the top or bottom of the stairs. If you do have throw rugs, attach them to the floor with carpet tape. Make sure that you have a light switch at the top of the  stairs and the bottom of the stairs. If you do not have them, ask someone to add them for you. What else can I do to help prevent falls? Wear shoes that: Do not have high heels. Have rubber bottoms. Are comfortable and fit you well. Are closed at the toe. Do not wear sandals. If you use a stepladder: Make sure that it is fully opened. Do not climb a closed stepladder. Make sure that both sides of the stepladder are locked into place. Ask someone to hold it for you, if possible. Clearly mark and make sure that you can see: Any grab bars or handrails. First and last steps. Where the edge of each step is. Use tools that help you move around (mobility aids) if they are needed. These include: Canes. Walkers. Scooters. Crutches. Turn on the lights when you go into a dark area. Replace any light bulbs as soon as they burn out. Set up your furniture so you have a clear path. Avoid moving your furniture around. If any of your floors are uneven, fix them. If there are any pets around you, be aware of where they are. Review your medicines with your doctor. Some medicines can make you feel dizzy. This can increase your chance of falling. Ask your doctor what other things that you can do to help prevent falls. This information is not intended to replace advice given to you by your health care provider. Make sure you discuss any questions you have with your health care provider. Document Released: 11/10/2008 Document Revised: 06/22/2015 Document Reviewed: 02/18/2014 Elsevier Interactive Patient Education  2017 [employer].

## 2022-02-13 ENCOUNTER — Encounter: Payer: Self-pay | Admitting: Nurse Practitioner

## 2022-02-13 ENCOUNTER — Ambulatory Visit (INDEPENDENT_AMBULATORY_CARE_PROVIDER_SITE_OTHER): Payer: Medicare (Managed Care) | Admitting: Nurse Practitioner

## 2022-02-13 VITALS — BP 116/70 | HR 76 | Temp 97.8°F | Ht 68.6 in | Wt 203.0 lb

## 2022-02-13 DIAGNOSIS — I700 Atherosclerosis of aorta: Secondary | ICD-10-CM

## 2022-02-13 DIAGNOSIS — I69351 Hemiplegia and hemiparesis following cerebral infarction affecting right dominant side: Secondary | ICD-10-CM

## 2022-02-13 DIAGNOSIS — I120 Hypertensive chronic kidney disease with stage 5 chronic kidney disease or end stage renal disease: Secondary | ICD-10-CM | POA: Diagnosis not present

## 2022-02-13 DIAGNOSIS — Z992 Dependence on renal dialysis: Secondary | ICD-10-CM

## 2022-02-13 DIAGNOSIS — E039 Hypothyroidism, unspecified: Secondary | ICD-10-CM

## 2022-02-13 DIAGNOSIS — N186 End stage renal disease: Secondary | ICD-10-CM | POA: Diagnosis not present

## 2022-02-13 DIAGNOSIS — Z2821 Immunization not carried out because of patient refusal: Secondary | ICD-10-CM

## 2022-02-13 DIAGNOSIS — I10 Essential (primary) hypertension: Secondary | ICD-10-CM

## 2022-02-13 MED ORDER — METOPROLOL TARTRATE 100 MG PO TABS: 100.0000 mg | ORAL_TABLET | Freq: Every day | ORAL | 1 refills | Status: DC

## 2022-02-13 NOTE — Patient Instructions (Signed)
Hypertension, Adult High blood pressure (hypertension) is when the force of blood pumping through the arteries is too strong. The arteries are the blood vessels that carry blood from the heart throughout the body. Hypertension forces the heart to work harder to pump blood and may cause arteries to become narrow or stiff. Untreated or uncontrolled hypertension can lead to a heart attack, heart failure, a stroke, kidney disease, and other problems. A blood pressure reading consists of a higher number over a lower number. Ideally, your blood pressure should be below 120/80. The first ("top") number is called the systolic pressure. It is a measure of the pressure in your arteries as your heart beats. The second ("bottom") number is called the diastolic pressure. It is a measure of the pressure in your arteries as the heart relaxes. What are the causes? The exact cause of this condition is not known. There are some conditions that result in high blood pressure. What increases the risk? Certain factors may make you more likely to develop high blood pressure. Some of these risk factors are under your control, including: Smoking. Not getting enough exercise or physical activity. Being overweight. Having too much fat, sugar, calories, or salt (sodium) in your diet. Drinking too much alcohol. Other risk factors include: Having a personal history of heart disease, diabetes, high cholesterol, or kidney disease. Stress. Having a family history of high blood pressure and high cholesterol. Having obstructive sleep apnea. Age. The risk increases with age. What are the signs or symptoms? High blood pressure may not cause symptoms. Very high blood pressure (hypertensive crisis) may cause: Headache. Fast or irregular heartbeats (palpitations). Shortness of breath. Nosebleed. Nausea and vomiting. Vision changes. Severe chest pain, dizziness, and seizures. How is this diagnosed? This condition is diagnosed by  measuring your blood pressure while you are seated, with your arm resting on a flat surface, your legs uncrossed, and your feet flat on the floor. The cuff of the blood pressure monitor will be placed directly against the skin of your upper arm at the level of your heart. Blood pressure should be measured at least twice using the same arm. Certain conditions can cause a difference in blood pressure between your right and left arms. If you have a high blood pressure reading during one visit or you have normal blood pressure with other risk factors, you may be asked to: Return on a different day to have your blood pressure checked again. Monitor your blood pressure at home for 1 week or longer. If you are diagnosed with hypertension, you may have other blood or imaging tests to help your health care provider understand your overall risk for other conditions. How is this treated? This condition is treated by making healthy lifestyle changes, such as eating healthy foods, exercising more, and reducing your alcohol intake. You may be referred for counseling on a healthy diet and physical activity. Your health care provider may prescribe medicine if lifestyle changes are not enough to get your blood pressure under control and if: Your systolic blood pressure is above 130. Your diastolic blood pressure is above 80. Your personal target blood pressure may vary depending on your medical conditions, your age, and other factors. Follow these instructions at home: Eating and drinking  Eat a diet that is high in fiber and potassium, and low in sodium, added sugar, and fat. An example of this eating plan is called the DASH diet. DASH stands for Dietary Approaches to Stop Hypertension. To eat this way: Eat   plenty of fresh fruits and vegetables. Try to fill one half of your plate at each meal with fruits and vegetables. Eat whole grains, such as whole-wheat pasta, brown rice, or whole-grain bread. Fill about one  fourth of your plate with whole grains. Eat or drink low-fat dairy products, such as skim milk or low-fat yogurt. Avoid fatty cuts of meat, processed or cured meats, and poultry with skin. Fill about one fourth of your plate with lean proteins, such as fish, chicken without skin, beans, eggs, or tofu. Avoid pre-made and processed foods. These tend to be higher in sodium, added sugar, and fat. Reduce your daily sodium intake. Many people with hypertension should eat less than 1,500 mg of sodium a day. Do not drink alcohol if: Your health care provider tells you not to drink. You are pregnant, may be pregnant, or are planning to become pregnant. If you drink alcohol: Limit how much you have to: 0-1 drink a day for women. 0-2 drinks a day for men. Know how much alcohol is in your drink. In the [place] one drink equals one 12 oz bottle of beer (355 mL), one 5 oz glass of wine (148 mL), or one 1 oz glass of hard liquor (44 mL). Lifestyle  Work with your health care provider to maintain a healthy body weight or to lose weight. Ask what an ideal weight is for you. Get at least 30 minutes of exercise that causes your heart to beat faster (aerobic exercise) most days of the week. Activities may include walking, swimming, or biking. Include exercise to strengthen your muscles (resistance exercise), such as Pilates or lifting weights, as part of your weekly exercise routine. Try to do these types of exercises for 30 minutes at least 3 days a week. Do not use any products that contain nicotine or tobacco. These products include cigarettes, chewing tobacco, and vaping devices, such as e-cigarettes. If you need help quitting, ask your health care provider. Monitor your blood pressure at home as told by your health care provider. Keep all follow-up visits. This is important. Medicines Take over-the-counter and prescription medicines only as told by your health care provider. Follow directions carefully. Blood  pressure medicines must be taken as prescribed. Do not skip doses of blood pressure medicine. Doing this puts you at risk for problems and can make the medicine less effective. Ask your health care provider about side effects or reactions to medicines that you should watch for. Contact a health care provider if you: Think you are having a reaction to a medicine you are taking. Have headaches that keep coming back (recurring). Feel dizzy. Have swelling in your ankles. Have trouble with your vision. Get help right away if you: Develop a severe headache or confusion. Have unusual weakness or numbness. Feel faint. Have severe pain in your chest or abdomen. Vomit repeatedly. Have trouble breathing. These symptoms may be an emergency. Get help right away. Call 911. Do not wait to see if the symptoms will go away. Do not drive yourself to the hospital. Summary Hypertension is when the force of blood pumping through your arteries is too strong. If this condition is not controlled, it may put you at risk for serious complications. Your personal target blood pressure may vary depending on your medical conditions, your age, and other factors. For most people, a normal blood pressure is less than 120/80. Hypertension is treated with lifestyle changes, medicines, or a combination of both. Lifestyle changes include losing weight, eating a healthy,   low-sodium diet, exercising more, and limiting alcohol. This information is not intended to replace advice given to you by your health care provider. Make sure you discuss any questions you have with your health care provider. Document Revised: 11/21/2020 Document Reviewed: 11/21/2020 Elsevier Patient Education  2023 Elsevier Inc.  

## 2022-02-13 NOTE — Progress Notes (Signed)
Subjective:     Patient ID: Javier Jenkins , male    DOB: 11-11-1950 , 72 y.o.   MRN: [id number]   Chief Complaint  Patient presents with   Hypertension    HPI  Hypertension This is a chronic problem. The current episode started more than 1 year ago. The problem is controlled. Pertinent negatives include no anxiety.     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   Seizures ([place])    after head injury   Stroke [facility]) 1998   right side paralysis     Family History  Problem Relation Age of Onset   Hypertension Mother    Cancer Paternal Uncle        prostate ca     Current Outpatient Medications:    aspirin 325 MG tablet, Take 1 tablet (325 mg total) by mouth daily., Disp: 30 tablet, Rfl: 0   atorvastatin (LIPITOR) 10 MG tablet, Take 1 tablet (10 mg total) by mouth every evening., Disp: 90 tablet, Rfl: 1   cinacalcet (SENSIPAR) 60 MG tablet, Take 60 mg by mouth every evening., Disp: , Rfl:    clobetasol ointment (TEMOVATE) 6.81 %, Apply 1 application. topically 2 (two) times daily as needed (skin irritation)., Disp: , Rfl:    ferric citrate (AURYXIA) 1 GM 210 MG(Fe) tablet, Take 210-630 mg by mouth See admin instructions. Take 3 tablets (630 mg) by mouth with each meal & take 1-2 tablets (210 mg-420 mg) by mouth with each snack, Disp: , Rfl:    levETIRAcetam (KEPPRA) 500 MG tablet, TAKE 1 TABLET (500 MG TOTAL) BY MOUTH DAILY., Disp:  90 tablet, Rfl: 1   levothyroxine (SYNTHROID) 25 MCG tablet, TAKE 1 TABLET BY MOUTH EVERY DAY, Disp: 90 tablet, Rfl: 1   LOKELMA 10 g PACK packet, Take 1 packet by mouth as directed., Disp: , Rfl:    metoprolol tartrate (LOPRESSOR) 100 MG tablet, Take 1 tablet (100 mg total) by mouth daily., Disp: 90 tablet, Rfl: 1   multivitamin (RENA-VIT) TABS tablet, Take 1 tablet by mouth at bedtime., Disp: 90 tablet, Rfl: 0   Allergies  Allergen Reactions   Tape Hives    Paper tape and adhesives     Review of Systems   Today's Vitals   02/13/22 1442  BP: 116/70  Pulse: 76  Temp: 97.8 F (36.6 C)  TempSrc: Oral  Weight: 203 lb (92.1 kg)  Height: 5' 8.6" (1.742 m)   Body mass index is 30.33 kg/m.   Objective:  Physical Exam Vitals reviewed.  Constitutional:      General: He is not in acute distress.    Appearance: Normal appearance. He is obese.  HENT:     Head: Normocephalic and atraumatic.  Eyes:     Pupils: Pupils are equal, round, and reactive to light.  Cardiovascular:     Rate and Rhythm: Normal rate and regular rhythm.     Pulses: Normal pulses.     Heart sounds: Normal heart sounds. No murmur heard.    Arteriovenous access: Left arteriovenous access is present. Pulmonary:     Effort: Pulmonary effort is normal. No respiratory distress.     Breath sounds: Normal breath sounds. No wheezing.  Musculoskeletal:     Comments: Leg brace on right lower extremity from previous stroke. Has flaccid right upper extremity.   Skin:    General: Skin is warm and dry.     Capillary Refill: Capillary refill takes less than 2 seconds.  Neurological:     General: No focal deficit present.     Mental Status: He is alert and oriented to person, place, and time.     Cranial Nerves: No cranial nerve deficit.     Motor: No weakness.  Psychiatric:        Mood and Affect: Mood normal.        Behavior: Behavior normal.        Thought Content: Thought content normal.        Judgment: Judgment  normal.         Assessment And Plan:     1. Benign hypertension with end-stage renal disease ([place]) Comments: Patient is well-controlled.  Continue with dialysis 3 days a week.  Had recent labs done at dialysis will abstract. - metoprolol tartrate (LOPRESSOR) 100 MG tablet; Take 1 tablet (100 mg total) by mouth daily.  Dispense: 90 tablet; Refill: 1  2. Atherosclerosis of aorta (HCC) Comments: Continue statin, tolerating well.  3. Acquired hypothyroidism Comments: Will check thyroid levels.  Thyroid levels have been stable - TSH + free T4  4. ESRD (end stage renal disease) ([place])  5. 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 Tetanus, diphtheria, and acellular pertussis (Tdap) vaccination declined  7. Hemiparesis affecting right side as late effect of stroke [facility]) Comments: He is not wearing his leg brace today     Patient was given opportunity to ask questions. Patient verbalized understanding of the plan and was able to repeat key elements of the plan. All questions were answered to their satisfaction.  [name], FNP   I, [name], FNP, have reviewed all documentation for this visit. The documentation on 02/13/22 for the exam, diagnosis, procedures, and orders are all accurate and complete.   IF YOU HAVE BEEN REFERRED TO A SPECIALIST, IT MAY TAKE 1-2 WEEKS TO SCHEDULE/PROCESS THE REFERRAL. IF YOU HAVE NOT HEARD FROM US/SPECIALIST IN TWO WEEKS, PLEASE GIVE [place] A CALL AT [phone].   THE PATIENT IS ENCOURAGED TO PRACTICE SOCIAL DISTANCING DUE TO THE COVID-19 PANDEMIC.

## 2022-02-14 LAB — TSH+FREE T4
Free T4: 1.05 ng/dL (ref 0.82–1.77)
TSH: 2.82 u[IU]/mL (ref 0.450–4.500)

## 2022-02-18 DIAGNOSIS — L84 Corns and callosities: Secondary | ICD-10-CM | POA: Diagnosis not present

## 2022-02-18 DIAGNOSIS — B351 Tinea unguium: Secondary | ICD-10-CM | POA: Diagnosis not present

## 2022-02-18 DIAGNOSIS — I70203 Unspecified atherosclerosis of native arteries of extremities, bilateral legs: Secondary | ICD-10-CM | POA: Diagnosis not present

## 2022-02-24 ENCOUNTER — Other Ambulatory Visit: Payer: Self-pay | Admitting: Nurse Practitioner

## 2022-02-24 DIAGNOSIS — R7303 Prediabetes: Secondary | ICD-10-CM

## 2022-02-28 DIAGNOSIS — Z992 Dependence on renal dialysis: Secondary | ICD-10-CM | POA: Diagnosis not present

## 2022-02-28 DIAGNOSIS — N2581 Secondary hyperparathyroidism of renal origin: Secondary | ICD-10-CM | POA: Diagnosis not present

## 2022-02-28 DIAGNOSIS — N186 End stage renal disease: Secondary | ICD-10-CM | POA: Diagnosis not present

## 2022-02-28 DIAGNOSIS — Z111 Encounter for screening for respiratory tuberculosis: Secondary | ICD-10-CM | POA: Diagnosis not present

## 2022-03-28 DIAGNOSIS — N186 End stage renal disease: Secondary | ICD-10-CM | POA: Diagnosis not present

## 2022-03-28 DIAGNOSIS — Z992 Dependence on renal dialysis: Secondary | ICD-10-CM | POA: Diagnosis not present

## 2022-03-28 DIAGNOSIS — I129 Hypertensive chronic kidney disease with stage 1 through stage 4 chronic kidney disease, or unspecified chronic kidney disease: Secondary | ICD-10-CM | POA: Diagnosis not present

## 2022-03-30 DIAGNOSIS — Z992 Dependence on renal dialysis: Secondary | ICD-10-CM | POA: Diagnosis not present

## 2022-03-30 DIAGNOSIS — N186 End stage renal disease: Secondary | ICD-10-CM | POA: Diagnosis not present

## 2022-03-30 DIAGNOSIS — N2581 Secondary hyperparathyroidism of renal origin: Secondary | ICD-10-CM | POA: Diagnosis not present

## 2022-04-28 DIAGNOSIS — I129 Hypertensive chronic kidney disease with stage 1 through stage 4 chronic kidney disease, or unspecified chronic kidney disease: Secondary | ICD-10-CM | POA: Diagnosis not present

## 2022-04-28 DIAGNOSIS — N186 End stage renal disease: Secondary | ICD-10-CM | POA: Diagnosis not present

## 2022-04-28 DIAGNOSIS — Z992 Dependence on renal dialysis: Secondary | ICD-10-CM | POA: Diagnosis not present

## 2022-05-08 DIAGNOSIS — Z992 Dependence on renal dialysis: Secondary | ICD-10-CM | POA: Diagnosis not present

## 2022-05-08 DIAGNOSIS — N186 End stage renal disease: Secondary | ICD-10-CM | POA: Diagnosis not present

## 2022-05-08 DIAGNOSIS — T82858A Stenosis of vascular prosthetic devices, implants and grafts, initial encounter: Secondary | ICD-10-CM | POA: Diagnosis not present

## 2022-05-08 DIAGNOSIS — I871 Compression of vein: Secondary | ICD-10-CM | POA: Diagnosis not present

## 2022-05-20 DIAGNOSIS — I70203 Unspecified atherosclerosis of native arteries of extremities, bilateral legs: Secondary | ICD-10-CM | POA: Diagnosis not present

## 2022-05-20 DIAGNOSIS — L84 Corns and callosities: Secondary | ICD-10-CM | POA: Diagnosis not present

## 2022-05-20 DIAGNOSIS — B351 Tinea unguium: Secondary | ICD-10-CM | POA: Diagnosis not present

## 2022-05-22 ENCOUNTER — Other Ambulatory Visit: Payer: Self-pay | Admitting: Nurse Practitioner

## 2022-05-28 DIAGNOSIS — I129 Hypertensive chronic kidney disease with stage 1 through stage 4 chronic kidney disease, or unspecified chronic kidney disease: Secondary | ICD-10-CM | POA: Diagnosis not present

## 2022-05-28 DIAGNOSIS — Z992 Dependence on renal dialysis: Secondary | ICD-10-CM | POA: Diagnosis not present

## 2022-05-28 DIAGNOSIS — N186 End stage renal disease: Secondary | ICD-10-CM | POA: Diagnosis not present

## 2022-05-30 DIAGNOSIS — Z992 Dependence on renal dialysis: Secondary | ICD-10-CM | POA: Diagnosis not present

## 2022-05-30 DIAGNOSIS — N2581 Secondary hyperparathyroidism of renal origin: Secondary | ICD-10-CM | POA: Diagnosis not present

## 2022-05-30 DIAGNOSIS — N186 End stage renal disease: Secondary | ICD-10-CM | POA: Diagnosis not present

## 2022-06-28 DIAGNOSIS — N186 End stage renal disease: Secondary | ICD-10-CM | POA: Diagnosis not present

## 2022-06-28 DIAGNOSIS — I129 Hypertensive chronic kidney disease with stage 1 through stage 4 chronic kidney disease, or unspecified chronic kidney disease: Secondary | ICD-10-CM | POA: Diagnosis not present

## 2022-06-28 DIAGNOSIS — Z992 Dependence on renal dialysis: Secondary | ICD-10-CM | POA: Diagnosis not present

## 2022-06-29 DIAGNOSIS — D631 Anemia in chronic kidney disease: Secondary | ICD-10-CM | POA: Diagnosis not present

## 2022-06-29 DIAGNOSIS — Z992 Dependence on renal dialysis: Secondary | ICD-10-CM | POA: Diagnosis not present

## 2022-06-29 DIAGNOSIS — N186 End stage renal disease: Secondary | ICD-10-CM | POA: Diagnosis not present

## 2022-06-29 DIAGNOSIS — N2581 Secondary hyperparathyroidism of renal origin: Secondary | ICD-10-CM | POA: Diagnosis not present

## 2022-07-28 DIAGNOSIS — Z992 Dependence on renal dialysis: Secondary | ICD-10-CM | POA: Diagnosis not present

## 2022-07-28 DIAGNOSIS — I129 Hypertensive chronic kidney disease with stage 1 through stage 4 chronic kidney disease, or unspecified chronic kidney disease: Secondary | ICD-10-CM | POA: Diagnosis not present

## 2022-07-28 DIAGNOSIS — N186 End stage renal disease: Secondary | ICD-10-CM | POA: Diagnosis not present

## 2022-07-30 DIAGNOSIS — N186 End stage renal disease: Secondary | ICD-10-CM | POA: Diagnosis not present

## 2022-07-30 DIAGNOSIS — Z992 Dependence on renal dialysis: Secondary | ICD-10-CM | POA: Diagnosis not present

## 2022-07-30 DIAGNOSIS — N2581 Secondary hyperparathyroidism of renal origin: Secondary | ICD-10-CM | POA: Diagnosis not present

## 2022-07-30 DIAGNOSIS — E119 Type 2 diabetes mellitus without complications: Secondary | ICD-10-CM | POA: Diagnosis not present

## 2022-07-31 ENCOUNTER — Encounter: Payer: Self-pay | Admitting: Nurse Practitioner

## 2022-07-31 ENCOUNTER — Ambulatory Visit (INDEPENDENT_AMBULATORY_CARE_PROVIDER_SITE_OTHER): Payer: Medicare (Managed Care) | Admitting: Nurse Practitioner

## 2022-07-31 VITALS — BP 130/76 | HR 75 | Temp 97.8°F | Ht 68.0 in | Wt 204.2 lb

## 2022-07-31 DIAGNOSIS — I120 Hypertensive chronic kidney disease with stage 5 chronic kidney disease or end stage renal disease: Secondary | ICD-10-CM | POA: Insufficient documentation

## 2022-07-31 DIAGNOSIS — N186 End stage renal disease: Secondary | ICD-10-CM

## 2022-07-31 DIAGNOSIS — E039 Hypothyroidism, unspecified: Secondary | ICD-10-CM | POA: Diagnosis not present

## 2022-07-31 DIAGNOSIS — C8338 Diffuse large B-cell lymphoma, lymph nodes of multiple sites: Secondary | ICD-10-CM

## 2022-07-31 DIAGNOSIS — Z0001 Encounter for general adult medical examination with abnormal findings: Secondary | ICD-10-CM | POA: Diagnosis not present

## 2022-07-31 DIAGNOSIS — Z2821 Immunization not carried out because of patient refusal: Secondary | ICD-10-CM | POA: Insufficient documentation

## 2022-07-31 DIAGNOSIS — Z992 Dependence on renal dialysis: Secondary | ICD-10-CM | POA: Insufficient documentation

## 2022-07-31 DIAGNOSIS — I69351 Hemiplegia and hemiparesis following cerebral infarction affecting right dominant side: Secondary | ICD-10-CM

## 2022-07-31 DIAGNOSIS — Z0000 Encounter for general adult medical examination without abnormal findings: Secondary | ICD-10-CM | POA: Insufficient documentation

## 2022-07-31 DIAGNOSIS — R7303 Prediabetes: Secondary | ICD-10-CM | POA: Diagnosis not present

## 2022-07-31 DIAGNOSIS — Z2989 Encounter for other specified prophylactic measures: Secondary | ICD-10-CM | POA: Diagnosis not present

## 2022-07-31 DIAGNOSIS — I770 Arteriovenous fistula, acquired: Secondary | ICD-10-CM

## 2022-07-31 MED ORDER — LEVOTHYROXINE SODIUM 25 MCG PO TABS: 25.0000 ug | ORAL_TABLET | Freq: Every day | ORAL | 1 refills | Status: DC

## 2022-07-31 MED ORDER — LEVETIRACETAM 500 MG PO TABS: 500.0000 mg | ORAL_TABLET | Freq: Every day | ORAL | 1 refills | Status: DC

## 2022-07-31 MED ORDER — METOPROLOL TARTRATE 100 MG PO TABS: 100.0000 mg | ORAL_TABLET | Freq: Every day | ORAL | 1 refills | Status: DC

## 2022-07-31 MED ORDER — ATORVASTATIN CALCIUM 10 MG PO TABS
10.0000 mg | ORAL_TABLET | Freq: Every evening | ORAL | 1 refills | Status: DC
Start: 2022-07-31 — End: 2023-03-12

## 2022-07-31 NOTE — Progress Notes (Signed)
[name], CMA,acting as a Neurosurgeon for [name], FNP.,have documented all relevant documentation on the behalf of [name], FNP,as directed by  [name], FNP while in the presence of [name], FNP.  Subjective:   Patient ID: Javier Jenkins , male    DOB: September 22, 1950 , 72 y.o.   MRN: [id number]  Chief Complaint  Patient presents with   Annual Exam    HPI  Patient presents today for HM, Patient reports compliance with medications. Patient has no other concerns today, patient denies any chest pain, SOB, or headaches. Continues to do well with ESRD.          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 topically 2 (two) times daily as needed (skin irritation)., Disp: , Rfl:    ferric citrate (AURYXIA) 1 GM 210 MG(Fe) tablet, Take 210-630 mg by mouth See admin instructions. Take 3 tablets (630 mg) by mouth with each meal & take 1-2 tablets (210  mg-420 mg) by mouth with each snack, Disp: , Rfl:    LOKELMA 10 g PACK packet, Take 1 packet by mouth as directed., Disp: , Rfl:    multivitamin (RENA-VIT) TABS tablet, Take 1 tablet by mouth at bedtime., Disp: 90 tablet, Rfl: 0   atorvastatin (LIPITOR) 10 MG tablet, Take 1 tablet (10 mg total) by mouth every evening., Disp: 90 tablet, Rfl: 1   levETIRAcetam (KEPPRA) 500 MG tablet, Take 1 tablet (500 mg total) by mouth daily., Disp: 90 tablet, Rfl: 1   levothyroxine (SYNTHROID) 25 MCG tablet, Take 1 tablet (25 mcg total) by mouth daily., Disp: 90 tablet, Rfl: 1   metoprolol tartrate (LOPRESSOR) 100 MG tablet, Take 1 tablet (100 mg total) by mouth daily., Disp: 90 tablet, Rfl: 1   Allergies  Allergen Reactions   Tape Hives    Paper tape and adhesives     Men's preventive visit. Patient Health Questionnaire (PHQ-2) is  Flowsheet Row Office Visit from 07/31/2022 in [facility]  PHQ-2 Total Score 0     Patient is on a Regular diet; goal to do renal diet. Exercise - using his eliptical with walking daily, he is getting at least 10,000  steps per day.  Marital status: Married. Relevant history for alcohol use is:  Social History   Substance and Sexual Activity  Alcohol Use No   Alcohol/week: 0.0 standard drinks of alcohol  . Relevant history for tobacco use is:  Social History   Tobacco Use  Smoking Status Former   Types: Pipe   Quit date: 09/22/1984   Years since quitting: 37.9  Smokeless Tobacco Never  .   Review of Systems  Constitutional: Negative.   HENT: Negative.    Eyes: Negative.   Respiratory: Negative.    Cardiovascular: Negative.   Gastrointestinal: Negative.   Endocrine: Negative.   Genitourinary: Negative.   Musculoskeletal: Negative.   Skin: Negative.   Allergic/Immunologic: Negative.   Hematological: Negative.   Psychiatric/Behavioral: Negative.       Today's Vitals   07/31/22 1412  BP: 130/76  Pulse: 75  Temp: 97.8 F  (36.6 C)  TempSrc: Oral  Weight: 204 lb 3.2 oz (92.6 kg)  Height: 5\' 8"  (1.727 m)  PainSc: 0-No pain   Body mass index is 31.05 kg/m.  Wt Readings from Last 3 Encounters:  07/31/22 204 lb 3.2 oz (92.6 kg)  02/13/22 203 lb (92.1 kg)  01/30/22 202 lb 9.6 oz (91.9 kg)    Objective:  Physical Exam Vitals reviewed.  Constitutional:      General: He is not in acute distress.    Appearance: Normal appearance. He is obese.  HENT:     Head: Normocephalic and atraumatic.     Right Ear: Tympanic membrane, ear canal and external ear normal. There is no impacted cerumen.     Left Ear: Tympanic membrane, ear canal and external ear normal. There is no impacted cerumen.     Nose: Nose normal.     Mouth/Throat:     Mouth: Mucous membranes are moist.  Eyes:     Extraocular Movements: Extraocular movements intact.     Conjunctiva/sclera: Conjunctivae normal.     Pupils: Pupils are equal, round, and reactive to light.  Cardiovascular:     Rate and Rhythm: Normal rate and regular rhythm.     Pulses: Normal pulses.     Heart sounds: Normal heart sounds. No murmur heard.    Arteriovenous access: Left arteriovenous access is present. Pulmonary:     Effort: Pulmonary effort is normal. No respiratory distress.     Breath sounds: Normal breath sounds. No wheezing.  Abdominal:     General: Abdomen is flat. Bowel sounds are normal. There is no distension.     Palpations: Abdomen is soft.     Tenderness: There is no abdominal tenderness.  Musculoskeletal:        General: No swelling or tenderness. Normal range of motion.     Cervical back: Normal range of motion and neck supple.     Comments: RUE and RLE hemiparesis with contracture at the wrist, RLE brace in place.   Skin:    General: Skin is warm and dry.     Capillary Refill: Capillary refill takes less than 2 seconds.  Neurological:     General: No focal deficit present.     Mental Status: He is alert and oriented to person, place, and  time.     Cranial Nerves: No cranial nerve deficit.     Motor: No weakness.  Psychiatric:        Mood and Affect: Mood normal.        Behavior: Behavior normal.  Thought Content: Thought content normal.        Judgment: Judgment normal.         Assessment And Plan:    Encounter for annual health examination Assessment & Plan: Behavior modifications discussed and diet history reviewed.   Pt will continue to exercise regularly and modify diet with low GI, plant based foods and decrease intake of processed foods.  Recommend intake of daily multivitamin, Vitamin D, and calcium.  Recommend colonoscopy for preventive screenings, as well as recommend immunizations that include influenza, TDAP, and Shingles (declined)    Benign hypertension with end-stage renal disease (HCC) Assessment & Plan: Blood pressure is controlled, continue current medications. EKG done with HR 75  Orders: -     EKG 12-Lead -     Lipid panel -     Metoprolol Tartrate; Take 1 tablet (100 mg total) by mouth daily.  Dispense: 90 tablet; Refill: 1  Acquired hypothyroidism -     TSH + free T4  AVF (arteriovenous fistula) (HCC) Assessment & Plan: Positive Bruit and thrill present   Seizure prophylaxis -     levETIRAcetam; Take 1 tablet (500 mg total) by mouth daily.  Dispense: 90 tablet; Refill: 1  Prediabetes Assessment & Plan: Stable, continue focusing on diet low in sugar and starches  Orders: -     Atorvastatin Calcium; Take 1 tablet (10 mg total) by mouth every evening.  Dispense: 90 tablet; Refill: 1  Seizure prophylaxis -     levETIRAcetam; Take 1 tablet (500 mg total) by mouth daily.  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  Dispense: 90 tablet; Refill: 1     No follow-ups on file. Patient was given opportunity to ask questions. Patient verbalized understanding of the plan and was able to repeat key elements of the plan. All questions were answered to their satisfaction.   [name], FNP  I, [name], FNP, have reviewed all documentation for this visit. The documentation on 07/31/22 for the exam, diagnosis, procedures, and orders are all accurate and complete.

## 2022-08-01 LAB — TSH+FREE T4
Free T4: 1.18 ng/dL (ref 0.82–1.77)
TSH: 2.06 u[IU]/mL (ref 0.450–4.500)

## 2022-08-01 LAB — LIPID PANEL
Chol/HDL Ratio: 4.7 ratio (ref 0.0–5.0)
Cholesterol, Total: 204 mg/dL — ABNORMAL HIGH (ref 100–199)
HDL: 43 mg/dL (ref >39)
LDL Chol Calc (NIH): 123 mg/dL — ABNORMAL HIGH (ref 0–99)
Triglycerides: 218 mg/dL — ABNORMAL HIGH (ref 0–149)
VLDL Cholesterol Cal: 38 mg/dL (ref 5–40)

## 2022-08-19 DIAGNOSIS — I70203 Unspecified atherosclerosis of native arteries of extremities, bilateral legs: Secondary | ICD-10-CM | POA: Diagnosis not present

## 2022-08-19 DIAGNOSIS — B351 Tinea unguium: Secondary | ICD-10-CM | POA: Diagnosis not present

## 2022-08-19 DIAGNOSIS — L84 Corns and callosities: Secondary | ICD-10-CM | POA: Diagnosis not present

## 2022-08-23 NOTE — Assessment & Plan Note (Addendum)
Blood pressure is controlled, continue current medications. EKG done with HR 75

## 2022-08-23 NOTE — Assessment & Plan Note (Addendum)
Behavior modifications discussed and diet history reviewed.   Pt will continue to exercise regularly and modify diet with low GI, plant based foods and decrease intake of processed foods.  Recommend intake of daily multivitamin, Vitamin D, and calcium.  Recommend colonoscopy for preventive screenings, as well as recommend immunizations that include influenza, TDAP, and Shingles (declined)

## 2022-08-23 NOTE — Assessment & Plan Note (Signed)
Wears a brace to his right lower extremity

## 2022-08-23 NOTE — Assessment & Plan Note (Signed)
Positive Bruit and thrill present

## 2022-08-23 NOTE — Assessment & Plan Note (Signed)
Declines shingrix, educated on disease process and is aware if he changes his mind to notify office  

## 2022-08-23 NOTE — Assessment & Plan Note (Signed)
Stable, continue focusing on diet low in sugar and starches

## 2022-08-28 DIAGNOSIS — N186 End stage renal disease: Secondary | ICD-10-CM | POA: Diagnosis not present

## 2022-08-28 DIAGNOSIS — I129 Hypertensive chronic kidney disease with stage 1 through stage 4 chronic kidney disease, or unspecified chronic kidney disease: Secondary | ICD-10-CM | POA: Diagnosis not present

## 2022-08-28 DIAGNOSIS — Z992 Dependence on renal dialysis: Secondary | ICD-10-CM | POA: Diagnosis not present

## 2022-08-30 DIAGNOSIS — N186 End stage renal disease: Secondary | ICD-10-CM | POA: Diagnosis not present

## 2022-08-30 DIAGNOSIS — N2581 Secondary hyperparathyroidism of renal origin: Secondary | ICD-10-CM | POA: Diagnosis not present

## 2022-08-30 DIAGNOSIS — Z992 Dependence on renal dialysis: Secondary | ICD-10-CM | POA: Diagnosis not present

## 2022-08-30 DIAGNOSIS — D631 Anemia in chronic kidney disease: Secondary | ICD-10-CM | POA: Diagnosis not present

## 2022-09-20 ENCOUNTER — Other Ambulatory Visit: Payer: Self-pay | Admitting: Nurse Practitioner

## 2022-09-28 DIAGNOSIS — I129 Hypertensive chronic kidney disease with stage 1 through stage 4 chronic kidney disease, or unspecified chronic kidney disease: Secondary | ICD-10-CM | POA: Diagnosis not present

## 2022-09-28 DIAGNOSIS — N186 End stage renal disease: Secondary | ICD-10-CM | POA: Diagnosis not present

## 2022-09-28 DIAGNOSIS — Z992 Dependence on renal dialysis: Secondary | ICD-10-CM | POA: Diagnosis not present

## 2022-11-08 ENCOUNTER — Other Ambulatory Visit: Payer: Self-pay | Admitting: Nurse Practitioner

## 2022-11-08 DIAGNOSIS — Z2989 Encounter for other specified prophylactic measures: Secondary | ICD-10-CM

## 2022-11-13 DIAGNOSIS — H02831 Dermatochalasis of right upper eyelid: Secondary | ICD-10-CM | POA: Diagnosis not present

## 2022-11-13 DIAGNOSIS — H02834 Dermatochalasis of left upper eyelid: Secondary | ICD-10-CM | POA: Diagnosis not present

## 2022-11-13 DIAGNOSIS — H35033 Hypertensive retinopathy, bilateral: Secondary | ICD-10-CM | POA: Diagnosis not present

## 2022-11-13 DIAGNOSIS — H527 Unspecified disorder of refraction: Secondary | ICD-10-CM | POA: Diagnosis not present

## 2022-11-13 DIAGNOSIS — H2513 Age-related nuclear cataract, bilateral: Secondary | ICD-10-CM | POA: Diagnosis not present

## 2022-11-28 ENCOUNTER — Ambulatory Visit: Payer: Self-pay | Admitting: Nurse Practitioner

## 2022-12-10 NOTE — Progress Notes (Addendum)
[name], CMA,acting as a Neurosurgeon for Javier Felts, FNP.,have documented all relevant documentation on the behalf of Javier Felts, FNP,as directed by  Javier Felts, FNP while in the presence of Javier Felts, FNP.  Subjective:  Patient ID: Javier Jenkins , male    DOB: 01-16-1951 , 72 y.o.   MRN: [id number]  Chief Complaint  Patient presents with   Hypertension    HPI  Patient presents today for a bp and pre dm follow up, Patient reports compliance with medication. Patient denies any chest pain, SOB, or headaches. Patient has no concerns today. Dialysis continues to go well.       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 topically 2 (two) times daily as needed (skin irritation)., Disp: , Rfl:    ferric citrate (AURYXIA) 1 GM 210 MG(Fe) tablet,  Take 210-630 mg by mouth See admin instructions. Take 3 tablets (630 mg) by mouth with each meal & take 1-2 tablets (210 mg-420 mg) by mouth with each snack, Disp: , Rfl:    levETIRAcetam (KEPPRA) 500 MG tablet, TAKE 1 TABLET (500 MG TOTAL) BY MOUTH DAILY., Disp: 90 tablet, Rfl: 1   levothyroxine (SYNTHROID) 25 MCG tablet, TAKE 1 TABLET BY MOUTH EVERY DAY, Disp: 90 tablet, Rfl: 1   LOKELMA 10 g PACK packet, Take 1 packet by mouth as directed., Disp: , Rfl:    metoprolol tartrate (LOPRESSOR) 100 MG tablet, Take 1 tablet (100 mg total) by mouth daily., Disp: 90 tablet, Rfl: 1   multivitamin (RENA-VIT) TABS tablet, Take 1 tablet by mouth at bedtime., Disp: 90 tablet, Rfl: 0   Allergies  Allergen Reactions   Tape Hives    Paper tape and adhesives     Review of Systems  Constitutional: Negative.   Respiratory: Negative.    Cardiovascular: Negative.  Negative for chest pain, palpitations and leg swelling.  Gastrointestinal: Negative.   Musculoskeletal: Negative.   Neurological: Negative.  Negative for headaches.  Psychiatric/Behavioral: Negative.       Today's Vitals   12/11/22 1457  BP: 130/70  Pulse:  70  Temp: 98.1 F (36.7 C)  TempSrc: Oral  Weight: 203 lb 3.2 oz (92.2 kg)  Height: 5\' 8"  (1.727 m)  PainSc: 0-No pain   Body mass index is 30.9 kg/m.  Wt Readings from Last 3 Encounters:  12/11/22 203 lb 3.2 oz (92.2 kg)  07/31/22 204 lb 3.2 oz (92.6 kg)  02/13/22 203 lb (92.1 kg)     Objective:  Physical Exam Vitals reviewed.  Constitutional:      General: He is not in acute distress.    Appearance: Normal appearance. He is obese.  HENT:     Head: Normocephalic.  Eyes:     Pupils: Pupils are equal, round, and reactive to light.  Cardiovascular:     Rate and Rhythm: Normal rate and regular rhythm.     Pulses: Normal pulses.     Heart sounds: Normal heart sounds. No murmur heard.    Arteriovenous access: Left arteriovenous access is present.    Comments: Positive  thrill and bruit to left AVF Pulmonary:     Effort: Pulmonary effort is normal. No respiratory distress.     Breath sounds: Normal breath sounds. No wheezing.  Musculoskeletal:     Comments: Leg brace on right lower extremity from previous stroke. Has flaccid right upper extremity.   Skin:    General: Skin is warm and dry.     Capillary Refill: Capillary refill takes less than 2 seconds.  Neurological:     General: No focal deficit present.     Mental Status: He is alert and oriented to person, place, and time.     Cranial Nerves: No cranial nerve deficit.     Motor: No weakness.  Psychiatric:        Mood and Affect: Mood normal.        Behavior: Behavior normal.        Thought Content: Thought content normal.        Judgment: Judgment normal.         Assessment And Plan:  Benign hypertension with end-stage renal disease (HCC) Assessment & Plan: Blood pressure is controlled, continue current medications.    Acquired hypothyroidism Assessment & Plan: Thyroid levels are normal. Continue current medications will make changes accordingly  Orders: -     TSH + free T4  Prediabetes Assessment & Plan: Stable, continue focusing on diet low in sugar and starches  Orders: -     Hemoglobin A1c  Herpes zoster vaccination declined Assessment & Plan: Declines shingrix, educated on disease process and is aware if he changes his mind to notify office    Tetanus, diphtheria, and acellular pertussis (Tdap) vaccination declined  Class 1 obesity due to excess calories with serious comorbidity and body mass index (BMI) of 30.0 to 30.9 in adult Assessment & Plan: he is encouraged to strive for BMI less than 30 to decrease cardiac risk. Advised to aim for at least 150 minutes of exercise per week.    ESRD on dialysis [facility]) Assessment & Plan: Doing well with dialysis, will abstract most recent BMP results from [facility]   Hemiparesis affecting right side as late effect of stroke  [facility]) Assessment & Plan: Wears a brace to his right lower extremity     Return for f/u prediabetes and thyroid in April.  Patient was given opportunity to ask questions. Patient verbalized understanding of the plan and was able to repeat key elements of the plan. All questions were answered to their satisfaction.    Susa Griffins  Christell Constant, FNP, have reviewed all documentation for this visit. The documentation on 12/27/22 for the exam, diagnosis, procedures, and orders are all accurate and complete.   IF YOU HAVE BEEN REFERRED TO A SPECIALIST, IT MAY TAKE 1-2 WEEKS TO SCHEDULE/PROCESS THE REFERRAL. IF YOU HAVE NOT HEARD FROM US/SPECIALIST IN TWO WEEKS, PLEASE GIVE [place] A CALL AT [phone].

## 2022-12-11 ENCOUNTER — Ambulatory Visit (INDEPENDENT_AMBULATORY_CARE_PROVIDER_SITE_OTHER): Payer: Medicare (Managed Care) | Admitting: Nurse Practitioner

## 2022-12-11 ENCOUNTER — Encounter: Payer: Self-pay | Admitting: Nurse Practitioner

## 2022-12-11 VITALS — BP 130/70 | HR 70 | Temp 98.1°F | Ht 68.0 in | Wt 203.2 lb

## 2022-12-11 DIAGNOSIS — I69351 Hemiplegia and hemiparesis following cerebral infarction affecting right dominant side: Secondary | ICD-10-CM

## 2022-12-11 DIAGNOSIS — E039 Hypothyroidism, unspecified: Secondary | ICD-10-CM

## 2022-12-11 DIAGNOSIS — I120 Hypertensive chronic kidney disease with stage 5 chronic kidney disease or end stage renal disease: Secondary | ICD-10-CM | POA: Diagnosis not present

## 2022-12-11 DIAGNOSIS — E6609 Other obesity due to excess calories: Secondary | ICD-10-CM

## 2022-12-11 DIAGNOSIS — N186 End stage renal disease: Secondary | ICD-10-CM | POA: Diagnosis not present

## 2022-12-11 DIAGNOSIS — R7303 Prediabetes: Secondary | ICD-10-CM | POA: Diagnosis not present

## 2022-12-11 DIAGNOSIS — E66811 Obesity, class 1: Secondary | ICD-10-CM

## 2022-12-11 DIAGNOSIS — Z992 Dependence on renal dialysis: Secondary | ICD-10-CM

## 2022-12-11 DIAGNOSIS — Z6830 Body mass index (BMI) 30.0-30.9, adult: Secondary | ICD-10-CM

## 2022-12-11 DIAGNOSIS — I770 Arteriovenous fistula, acquired: Secondary | ICD-10-CM

## 2022-12-11 DIAGNOSIS — Z2821 Immunization not carried out because of patient refusal: Secondary | ICD-10-CM

## 2022-12-11 HISTORY — DX: Other obesity due to excess calories: E66.09

## 2022-12-11 NOTE — Assessment & Plan Note (Signed)
Stable, continue focusing on diet low in sugar and starches

## 2022-12-11 NOTE — Assessment & Plan Note (Signed)
Declines shingrix, educated on disease process and is aware if he changes his mind to notify office  

## 2022-12-11 NOTE — Assessment & Plan Note (Signed)
Blood pressure is controlled, continue current medications

## 2022-12-11 NOTE — Assessment & Plan Note (Signed)
Wears a brace to his right lower extremity

## 2022-12-11 NOTE — Assessment & Plan Note (Signed)
Doing well with dialysis, will abstract most recent BMP results from [facility]

## 2022-12-11 NOTE — Assessment & Plan Note (Signed)
Positive thrill and bruit

## 2022-12-11 NOTE — Assessment & Plan Note (Signed)
he is encouraged to strive for BMI less than 30 to decrease cardiac risk. Advised to aim for at least 150 minutes of exercise per week.

## 2022-12-11 NOTE — Assessment & Plan Note (Signed)
Thyroid levels are normal. Continue current medications will make changes accordingly

## 2022-12-12 LAB — HEMOGLOBIN A1C
Est. average glucose Bld gHb Est-mCnc: 120 mg/dL
Hgb A1c MFr Bld: 5.8 % — ABNORMAL HIGH (ref 4.8–5.6)

## 2022-12-12 LAB — TSH+FREE T4
Free T4: 1.06 ng/dL (ref 0.82–1.77)
TSH: 2.55 u[IU]/mL (ref 0.450–4.500)

## 2022-12-17 ENCOUNTER — Other Ambulatory Visit: Payer: Self-pay | Admitting: Nurse Practitioner

## 2022-12-31 DIAGNOSIS — D631 Anemia in chronic kidney disease: Secondary | ICD-10-CM | POA: Diagnosis not present

## 2022-12-31 DIAGNOSIS — Z992 Dependence on renal dialysis: Secondary | ICD-10-CM | POA: Diagnosis not present

## 2022-12-31 DIAGNOSIS — N2581 Secondary hyperparathyroidism of renal origin: Secondary | ICD-10-CM | POA: Diagnosis not present

## 2022-12-31 DIAGNOSIS — D509 Iron deficiency anemia, unspecified: Secondary | ICD-10-CM | POA: Diagnosis not present

## 2022-12-31 DIAGNOSIS — N186 End stage renal disease: Secondary | ICD-10-CM | POA: Diagnosis not present

## 2023-01-28 DIAGNOSIS — Z992 Dependence on renal dialysis: Secondary | ICD-10-CM | POA: Diagnosis not present

## 2023-01-28 DIAGNOSIS — N186 End stage renal disease: Secondary | ICD-10-CM | POA: Diagnosis not present

## 2023-01-28 DIAGNOSIS — I129 Hypertensive chronic kidney disease with stage 1 through stage 4 chronic kidney disease, or unspecified chronic kidney disease: Secondary | ICD-10-CM | POA: Diagnosis not present

## 2023-03-12 ENCOUNTER — Other Ambulatory Visit: Payer: Self-pay | Admitting: Nurse Practitioner

## 2023-03-12 DIAGNOSIS — R7303 Prediabetes: Secondary | ICD-10-CM

## 2023-03-14 ENCOUNTER — Encounter (HOSPITAL_COMMUNITY): Payer: Self-pay | Admitting: Nephrology

## 2023-03-14 ENCOUNTER — Encounter (HOSPITAL_COMMUNITY)
Admission: RE | Disposition: A | Discharge status: Home / Self Care | Payer: Self-pay | Source: Ambulatory Visit | Attending: Nephrology

## 2023-03-14 ENCOUNTER — Other Ambulatory Visit: Payer: Self-pay

## 2023-03-14 ENCOUNTER — Ambulatory Visit (HOSPITAL_COMMUNITY)
Admission: RE | Admit: 2023-03-14 | Discharge: 2023-03-14 | Disposition: A | Discharge status: Home / Self Care | Payer: Medicare Other | Source: Ambulatory Visit | Attending: Nephrology | Admitting: Nephrology

## 2023-03-14 DIAGNOSIS — I120 Hypertensive chronic kidney disease with stage 5 chronic kidney disease or end stage renal disease: Secondary | ICD-10-CM | POA: Insufficient documentation

## 2023-03-14 DIAGNOSIS — N186 End stage renal disease: Secondary | ICD-10-CM | POA: Insufficient documentation

## 2023-03-14 DIAGNOSIS — Z992 Dependence on renal dialysis: Secondary | ICD-10-CM | POA: Insufficient documentation

## 2023-03-14 DIAGNOSIS — T82858A Stenosis of vascular prosthetic devices, implants and grafts, initial encounter: Secondary | ICD-10-CM | POA: Diagnosis present

## 2023-03-14 DIAGNOSIS — Y832 Surgical operation with anastomosis, bypass or graft as the cause of abnormal reaction of the patient, or of later complication, without mention of misadventure at the time of the procedure: Secondary | ICD-10-CM | POA: Insufficient documentation

## 2023-03-14 DIAGNOSIS — Z8572 Personal history of non-Hodgkin lymphomas: Secondary | ICD-10-CM | POA: Insufficient documentation

## 2023-03-14 DIAGNOSIS — Z8673 Personal history of transient ischemic attack (TIA), and cerebral infarction without residual deficits: Secondary | ICD-10-CM | POA: Diagnosis not present

## 2023-03-14 DIAGNOSIS — Z79899 Other long term (current) drug therapy: Secondary | ICD-10-CM | POA: Insufficient documentation

## 2023-03-14 DIAGNOSIS — N250 Renal osteodystrophy: Secondary | ICD-10-CM | POA: Insufficient documentation

## 2023-03-14 DIAGNOSIS — D631 Anemia in chronic kidney disease: Secondary | ICD-10-CM | POA: Insufficient documentation

## 2023-03-14 DIAGNOSIS — Z87891 Personal history of nicotine dependence: Secondary | ICD-10-CM | POA: Diagnosis not present

## 2023-03-14 HISTORY — PX: PERIPHERAL VASCULAR BALLOON ANGIOPLASTY: CATH118281

## 2023-03-14 HISTORY — PX: A/V FISTULAGRAM: CATH118298

## 2023-03-14 SURGERY — A/V FISTULAGRAM
Anesthesia: LOCAL

## 2023-03-14 MED ORDER — MIDAZOLAM HCL 2 MG/2ML IJ SOLN
INTRAMUSCULAR | Status: AC
  Filled 2023-03-14: qty 2

## 2023-03-14 MED ORDER — FENTANYL CITRATE (PF) 100 MCG/2ML IJ SOLN
INTRAMUSCULAR | Status: DC | PRN
  Administered 2023-03-14: 25 ug via INTRAVENOUS

## 2023-03-14 MED ORDER — LIDOCAINE HCL (PF) 1 % IJ SOLN
INTRAMUSCULAR | Status: DC | PRN
  Administered 2023-03-14: 2 mL via SUBCUTANEOUS

## 2023-03-14 MED ORDER — HEPARIN (PORCINE) IN NACL 1000-0.9 UT/500ML-% IV SOLN
INTRAVENOUS | Status: DC | PRN
  Administered 2023-03-14: 500 mL

## 2023-03-14 MED ORDER — LIDOCAINE HCL (PF) 1 % IJ SOLN
INTRAMUSCULAR | Status: AC
  Filled 2023-03-14: qty 30

## 2023-03-14 MED ORDER — MIDAZOLAM HCL 2 MG/2ML IJ SOLN
INTRAMUSCULAR | Status: DC | PRN
  Administered 2023-03-14: .5 mg via INTRAVENOUS

## 2023-03-14 MED ORDER — FENTANYL CITRATE (PF) 100 MCG/2ML IJ SOLN
INTRAMUSCULAR | Status: AC
  Filled 2023-03-14: qty 2

## 2023-03-14 MED ORDER — IODIXANOL 320 MG/ML IV SOLN
INTRAVENOUS | Status: DC | PRN
  Administered 2023-03-14: 8 mL via INTRAVENOUS

## 2023-03-14 SURGICAL SUPPLY — 8 items
BAG SNAP BAND KOVER 36X36 (MISCELLANEOUS) ×2 IMPLANT
BALLN MUSTANG 9X40X75 (BALLOONS) ×2
BALLOON MUSTANG 9X40X75 (BALLOONS) IMPLANT
COVER DOME SNAP 22 D (MISCELLANEOUS) ×2 IMPLANT
SHEATH PINNACLE R/O II 6F 4CM (SHEATH) IMPLANT
SYR MEDALLION 10ML (SYRINGE) IMPLANT
TRAY PV CATH (CUSTOM PROCEDURE TRAY) ×2 IMPLANT
WIRE BENTSON .035X145CM (WIRE) IMPLANT

## 2023-03-14 NOTE — H&P (Addendum)
Chief Complaint: Decreased flows  Interval H&P  The patient has presented today for an angiogram/ angioplasty.  Various methods of treatment have been discussed with the patient.  After consideration of risk, benefits and other options for treatment, the patient has consented to a angiogram/ angioplasty with  possible stent placement.   Risks of angiogram with potential angioplasty and stenting if needed.contrast reaction, extravasation/ bleeding, dissection, hypotension and death were explained to the patient.  The patient's history has been reviewed and the patient has been examined, no changes in status.  Stable for angiogram/angioplasty  I have reviewed the patient's chart and labs.  Questions were answered to the patient's satisfaction.  Assessment/Plan: ESRD dialyzing on  TTS regimen. 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 Renal osteodystrophy - continue binders per home regimen. Anemia - managed with ESA's and IV iron at dialysis center. HTN - resume home regimen.   HPI: Javier Jenkins is an 73 y.o. male NHL, CVA with right sided weakness, hypertension, ESRD here with decreased flows.  ROS Per HPI.  Chemistry and CBC: Creatinine  Date/Time Value Ref Range Status  07/08/2016 10:52 AM 8.7 (HH) 0.7 - 1.3 mg/dL Final  16/10/9602 54:09 AM 7.7 (HH) 0.7 - 1.3 mg/dL Final  81/19/1478 29:56 AM 9.5 (HH) 0.7 - 1.3 mg/dL Final  21/30/8657 84:69 AM 6.7 (HH) 0.7 - 1.3 mg/dL Final  62/95/2841 32:44 AM 7.8 (HH) 0.7 - 1.3 mg/dL Final  01/30/7251 66:44 AM 5.4 (HH) 0.7 - 1.3 mg/dL Final  03/47/4259 56:38 AM 4.4 Repeated and Verified (HH) 0.7 - 1.3 mg/dL Final  75/64/3329 51:88 AM 3.9 (HH) 0.7 - 1.3 mg/dL Final   Creatinine, Ser  Date/Time Value Ref Range Status  07/25/2021 03:06 PM 8.60 (H) 0.76 - 1.27 mg/dL Final  41/66/0630 16:01 AM 10.80 (H) 0.61 - 1.24 mg/dL Final  09/32/3557 32:20 AM 11.00 (H) 0.61 - 1.24  mg/dL Final  25/42/7062 37:62 PM 8.56 (H) 0.76 - 1.27 mg/dL Final  83/15/1761 60:73 AM 10.60 (H) 0.61 - 1.24 mg/dL Final  71/06/2692 85:46 AM 9.63 (H) 0.76 - 1.27 mg/dL Final    Comment:    **Verified by repeat analysis**  04/05/2019 04:15 PM 9.61 (H) 0.76 - 1.27 mg/dL Final  27/03/5007 38:18 AM 10.10 (H) 0.76 - 1.27 mg/dL Final    Comment:    **Verified by repeat analysis**  11/14/2017 04:59 AM 7.70 (H) 0.61 - 1.24 mg/dL Final  29/93/7169 67:89 PM 6.66 (H) 0.61 - 1.24 mg/dL Final  38/10/1749 02:58 AM 6.91 (H) 0.61 - 1.24 mg/dL Final  52/77/8242 35:36 AM 5.38 (H) 0.61 - 1.24 mg/dL Final  14/43/1540 08:67 AM 5.99 (H) 0.61 - 1.24 mg/dL Final  61/95/0932 67:12 AM 6.18 (H) 0.61 - 1.24 mg/dL Final  45/80/9983 38:25 AM 9.18 (H) 0.61 - 1.24 mg/dL Final  05/39/7673 41:93 AM 6.27 (H) 0.61 - 1.24 mg/dL Final  79/03/4095 35:32 PM 6.48 (H) 0.61 - 1.24 mg/dL Final  99/24/2683 41:96 PM 8.17 (H) 0.61 - 1.24 mg/dL Final  22/29/7989 21:19 PM 7.37 (H) 0.61 - 1.24 mg/dL Final  41/74/0814 48:18 PM 7.35 (H) 0.61 - 1.24 mg/dL Final  56/31/4970 26:37 PM 6.41 (H) 0.61 - 1.24 mg/dL Final  85/88/5027 74:12 PM 5.56 (H) 0.50 - 1.35 mg/dL Final  87/86/7672 09:47 PM 4.20 (H) 0.50 - 1.35 mg/dL Final  09/62/8366 29:47 PM 3.92 (H) 0.50 - 1.35 mg/dL Final   No results for input(s): "NA", "K", "CL", "CO2", "GLUCOSE", "  BUN", "CREATININE", "CALCIUM", "PHOS" in the last 168 hours.  Invalid input(s): "ALB" No results for input(s): "WBC", "NEUTROABS", "HGB", "HCT", "MCV", "PLT" in the last 168 hours. Liver Function Tests: No results for input(s): "AST", "ALT", "ALKPHOS", "BILITOT", "PROT", "ALBUMIN" in the last 168 hours. No results for input(s): "LIPASE", "AMYLASE" in the last 168 hours. No results for input(s): "AMMONIA" in the last 168 hours. Cardiac Enzymes: No results for input(s): "CKTOTAL", "CKMB", "CKMBINDEX", "TROPONINI" in the last 168 hours. Iron Studies: No results for input(s): "IRON", "TIBC",  "TRANSFERRIN", "FERRITIN" in the last 72 hours. PT/INR: @LABRCNTIP (inr:5)  Xrays/Other Studies: )No results found for this or any previous visit (from the past 48 hours). No results found.  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   Stroke [facility]) 1998   right side paralysis    PSH:   Past Surgical History:  Procedure Laterality Date   AV FISTULA PLACEMENT Left 10/18/2014   Procedure: ARTERIOVENOUS (AV) FISTULA CREATION;  Surgeon: Chuck Hint, MD;  Location: [facility] OR;  Service: Vascular;  Laterality: Left;   BURR HOLE Right 03/17/2015   Procedure: Ines Bloomer HOLES FOR RIGHT SUBDURAL HEMATOMA;  Surgeon: Hilda Lias, MD;  Location: [facility];  Service: Neurosurgery;  Laterality: Right;   COLONOSCOPY     FISTULA SUPERFICIALIZATION Left 01/01/2021   Procedure: PLICATION OF LEFT ARTERIOVENOUS FISTULA  PSEUDOANEURYSM;  Surgeon: Chuck Hint, MD;  Location: [facility] OR;  Service: Vascular;  Laterality: Left;   FISTULA SUPERFICIALIZATION Left 04/09/2021   Procedure: PLICATION OF LEFT ARTERIOVENOUS FISTULA;  Surgeon: Chuck Hint, MD;  Location: [facility] OR;  Service: Vascular;  Laterality: Left;   HEMORRHOID SURGERY     IR REMOVAL TUN ACCESS W/ PORT W/O FL MOD SED  05/12/2017   REVISON OF ARTERIOVENOUS FISTULA Left 09/04/2018   Procedure: EXCISION OF ULCER ON LEFT ARM ARTERIOVENOUS FISTULA;   Surgeon: Chuck Hint, MD;  Location: [facility] OR;  Service: Vascular;  Laterality: Left;    Allergies:  Allergies  Allergen Reactions   Tape Hives    Paper tape and adhesives    Medications:   Prior to Admission medications   Medication Sig Start Date End Date Taking? Authorizing Provider  aspirin 325 MG tablet Take 1 tablet (325 mg total) by mouth daily. 04/21/15  Yes Richarda Overlie, MD  atorvastatin (LIPITOR) 10 MG tablet TAKE 1 TABLET BY MOUTH EVERY DAY IN THE EVENING 03/12/23  Yes [name], FNP  cinacalcet (SENSIPAR) 60 MG tablet Take 60 mg by mouth every evening. 12/19/20  Yes [provider]  clobetasol ointment (TEMOVATE) 0.05 % Apply 1 application  topically 2 (two) times daily as needed (skin irritation/rash).   Yes [provider]  levETIRAcetam (KEPPRA) 500 MG tablet TAKE 1 TABLET (500  MG TOTAL) BY MOUTH DAILY. 11/08/22  Yes [name], FNP  levothyroxine (SYNTHROID) 25 MCG tablet TAKE 1 TABLET BY MOUTH EVERY DAY 09/20/22  Yes [name], FNP  LOKELMA 10 g PACK packet Take 10 g by mouth every Monday, Wednesday, and Friday.   Yes [provider]  metoprolol tartrate (LOPRESSOR) 100 MG tablet Take 1 tablet (100 mg total) by mouth daily. 07/31/22  Yes [name], FNP  multivitamin (RENA-VIT) TABS tablet Take 1 tablet by mouth at bedtime. 06/03/15  Yes Kirby Crigler, Mir M, MD  sevelamer (RENAGEL) 800 MG tablet Take 800 mg by mouth 3 (three) times daily with meals.   Yes [provider]    Discontinued Meds:   Medications Discontinued During This Encounter  Medication Reason   ferric citrate (AURYXIA) 1 GM 210 MG(Fe) tablet Patient Preference    Social History:  reports that he quit smoking about 38 years ago. His smoking use included pipe. He has never used smokeless tobacco. He reports that he does not drink alcohol and does not use drugs.  Family History:   Family History  Problem Relation Age of Onset   Hypertension Mother     Cancer Paternal Uncle        prostate ca    Blood pressure 108/81, pulse 87, temperature 98 F (36.7 C), temperature source Oral, resp. rate 18, height 5\' 11"  (1.803 m), weight 92.1 kg, SpO2 97%. GEN: NAD, A&Ox3, NCAT HEENT: No conjunctival pallor, EOMI NECK: Supple, no thyromegaly LUNGS: CTA B/L no rales, rhonchi or wheezing CV: RRR, No M/R/G ABD: SNDNT +BS  EXT: No lower extremity edema ACCESS: lt BCF with 2 large aneurysms       Narayan Scull, Len Blalock, MD 03/14/2023, 10:51 AM

## 2023-03-14 NOTE — Op Note (Signed)
Patient presents with decreased access flows of his left BCF whic full h on examination is aneurysmal.   Summary:  1)      The patient had successful angioplasty (9x4 Mustang FE ~18 atm) of significant 50% stenosis in the cephalic vein arch involving the confluence outflow edge of the stent, 50% focal outflow cephalic vein stenosis treated also with a 9 x 4 Mustang fully effaced approximately 15 atm of pressure..  Markedly less pulsatile even over the aneurysms after outflow PTA. 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 3)      The centrals were widely patent. 4)      This left BCF remains amenable to future percutaneous intervention.  Description of procedure: The arm was prepped and draped in the usual sterile fashion. The left upper arm brachial cephalic fistula was cannulated (16109) with an 18G Angiocath needle directed in an antegrade direction. A guidewire was inserted and exchanged for a 6 Fr sheath. Contrast [phone]) injection via the side port of the sheath was performed. The angiogram of the fistula (09811) showed an aneurysmal (2) body of the left BCF, focal 50% outflow cephalic vein stenosis and a 50% outflow edge of stent cephalic vein arch stenosis involving the confluence. The inflow anastomosis and left centrals were patent.  The wire was advanced centrally without any difficulty. An 8x4 Mustang angioplasty balloon was inserted over a glide wire and positioned at the cephalic vein arch and then focal outflow cephalic vein stenosis. Venous angioplasty (91478) was carried out to 15-18 ATM with FULL effacement of the waist on the balloon at both lesion sites.  Repeat angiogram showed 10% residual at the sites of angioplasty with no evidence of extravasation.  The flow of contrast was quicker and the fistula was markedly less pulsatile.  Hemostasis: A 3-0 ethilon purse string suture was placed at the cannulation site on removal of the sheath.  Sedation:  0.5 mg Versed, Fentanyl. Sedation time. 7 minutes  Contrast. 8 mL  Monitoring: Because of the patient's comorbid conditions and sedation during the procedure, continuous EKG monitoring and O2 saturation monitoring was performed throughout the procedure by the RN. There were no abnormal arrhythmias encountered.  Complications: None.   Diagnoses: I87.1 Stricture of vein  N18.6 ESRD T82.858A Stricture of access  Procedure Coding:  [phone] Cannulation and angiogram of fistula, venous angioplasty (cephalic vein arch) Z3086 Contrast  Recommendations:  1. Continue to cannulate the fistula with 15G needles.  2. Refer back for problems with flows. 3. Remove the suture next treatment.   Discharge: The patient was discharged home in stable condition. The patient was given education regarding the care of the dialysis access AVF and specific instructions in case of any problems.

## 2023-03-14 NOTE — Discharge Instructions (Signed)

## 2023-03-18 MED FILL — Lidocaine HCl Local Preservative Free (PF) Inj 1%: INTRAMUSCULAR | Qty: 30 | Status: AC

## 2023-05-12 ENCOUNTER — Ambulatory Visit (INDEPENDENT_AMBULATORY_CARE_PROVIDER_SITE_OTHER): Payer: Medicare (Managed Care) | Admitting: Nurse Practitioner

## 2023-05-12 ENCOUNTER — Encounter: Payer: Self-pay | Admitting: Nurse Practitioner

## 2023-05-12 VITALS — BP 120/80 | HR 67 | Temp 97.8°F | Ht 71.0 in | Wt 211.6 lb

## 2023-05-12 DIAGNOSIS — D631 Anemia in chronic kidney disease: Secondary | ICD-10-CM | POA: Diagnosis not present

## 2023-05-12 DIAGNOSIS — I69351 Hemiplegia and hemiparesis following cerebral infarction affecting right dominant side: Secondary | ICD-10-CM

## 2023-05-12 DIAGNOSIS — E039 Hypothyroidism, unspecified: Secondary | ICD-10-CM | POA: Diagnosis not present

## 2023-05-12 DIAGNOSIS — Z2821 Immunization not carried out because of patient refusal: Secondary | ICD-10-CM

## 2023-05-12 DIAGNOSIS — Z6829 Body mass index (BMI) 29.0-29.9, adult: Secondary | ICD-10-CM

## 2023-05-12 DIAGNOSIS — I120 Hypertensive chronic kidney disease with stage 5 chronic kidney disease or end stage renal disease: Secondary | ICD-10-CM

## 2023-05-12 DIAGNOSIS — Z992 Dependence on renal dialysis: Secondary | ICD-10-CM

## 2023-05-12 DIAGNOSIS — E663 Overweight: Secondary | ICD-10-CM

## 2023-05-12 DIAGNOSIS — N186 End stage renal disease: Secondary | ICD-10-CM | POA: Diagnosis not present

## 2023-05-12 DIAGNOSIS — R7303 Prediabetes: Secondary | ICD-10-CM

## 2023-05-12 NOTE — Assessment & Plan Note (Signed)
Continue with dialysis ?

## 2023-05-12 NOTE — Assessment & Plan Note (Signed)
 Declines shingrix, educated on disease process and is aware if he changes his mind to notify office

## 2023-05-12 NOTE — Assessment & Plan Note (Signed)
 Thyroid levels are normal. Continue current medications will make changes accordingly

## 2023-05-12 NOTE — Progress Notes (Signed)
 [place], CMA,acting as a Neurosurgeon for Susanna Epley, FNP.,have documented all relevant documentation on the behalf of Susanna Epley, FNP,as directed by  Susanna Epley, FNP while in the presence of Susanna Epley, FNP.  Subjective:  Patient ID: Javier Jenkins , male    DOB: 07-07-1950 , 73 y.o.   MRN: [id number]  Chief Complaint  Patient presents with   Hypertension    HPI  Patient presents today for a bp,thyroid, and pre dm follow up, Patient reports compliance with medication. Patient denies any chest pain, SOB, or headaches. Patient has no concerns today.      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   Stroke [facility]) 1998   right side paralysis     Family History  Problem Relation Age of Onset   Hypertension Mother    Cancer Paternal Uncle        prostate ca     Current Outpatient Medications:    aspirin 325 MG tablet, Take 1 tablet (325 mg total) by mouth daily., Disp: 30 tablet, Rfl: 0   atorvastatin (LIPITOR) 10 MG tablet, TAKE 1 TABLET BY MOUTH EVERY DAY IN THE EVENING, Disp: 90 tablet, Rfl: 1   cinacalcet (SENSIPAR) 60 MG tablet, Take 60 mg by mouth every evening., Disp: , Rfl:    clobetasol ointment (TEMOVATE) 0.05 %, Apply 1 application  topically 2 (two) times daily as needed (skin irritation/rash)., Disp: , Rfl:    levETIRAcetam (KEPPRA) 500 MG tablet, TAKE 1 TABLET (500 MG TOTAL) BY  MOUTH DAILY., Disp: 90 tablet, Rfl: 1   levothyroxine (SYNTHROID) 25 MCG tablet, TAKE 1 TABLET BY MOUTH EVERY DAY, Disp: 90 tablet, Rfl: 1   LOKELMA 10 g PACK packet, Take 10 g by mouth every Monday, Wednesday, and Friday., Disp: , Rfl:    metoprolol tartrate (LOPRESSOR) 100 MG tablet, Take 1 tablet (100 mg total) by mouth daily., Disp: 90 tablet, Rfl: 1   multivitamin (RENA-VIT) TABS tablet, Take 1 tablet by mouth at bedtime., Disp: 90 tablet, Rfl: 0   sevelamer (RENAGEL) 800 MG tablet, Take 800 mg by mouth 3 (three) times daily with meals., Disp: , Rfl:    Allergies  Allergen Reactions   Tape Hives    Paper tape and adhesives     Review of Systems  Constitutional: Negative.   Respiratory: Negative.    Cardiovascular: Negative.  Negative for chest pain, palpitations and leg swelling.  Gastrointestinal: Negative.   Musculoskeletal: Negative.   Neurological: Negative.  Negative for headaches.  Psychiatric/Behavioral: Negative.       Today's Vitals   05/12/23 1025  BP: 120/80  Pulse: 67  Temp: 97.8 F (36.6 C)  TempSrc: Oral  Weight: 211 lb 9.6 oz (96 kg)  Height: 5\' 11"  (1.803 m)  PainSc: 0-No pain   Body mass index is 29.51 kg/m.  Wt Readings from Last 3 Encounters:  05/12/23 211 lb 9.6 oz (96 kg)  03/14/23 203 lb (92.1 kg)  12/11/22 203 lb 3.2 oz (92.2 kg)    Objective:  Physical Exam Vitals and nursing note reviewed.  Constitutional:      General: He is not in acute distress.    Appearance: Normal appearance. He is obese.  HENT:     Head: Normocephalic.  Eyes:     Pupils: Pupils are equal, round, and reactive to light.  Cardiovascular:     Rate and Rhythm: Normal rate and regular rhythm.     Pulses: Normal pulses.     Heart sounds: Normal heart sounds. No murmur heard.    Arteriovenous access: Left arteriovenous access is present.    Comments: Positive thrill and bruit to left AVF Pulmonary:     Effort: Pulmonary effort is normal. No respiratory distress.      Breath sounds: Normal breath sounds. No wheezing.  Musculoskeletal:     Comments: Leg brace on right lower extremity from previous stroke. Has flaccid right upper extremity.   Skin:    General: Skin is warm and dry.     Capillary Refill: Capillary refill takes less than 2 seconds.  Neurological:     General: No focal deficit present.     Mental Status: He is alert and oriented to person, place, and time.     Cranial Nerves: No cranial nerve deficit.     Motor: No weakness.  Psychiatric:        Mood and Affect: Mood normal.        Behavior: Behavior normal.        Thought Content: Thought content normal.        Judgment: Judgment normal.         Assessment And Plan:  Benign hypertension with end-stage renal disease (HCC) Assessment & Plan: Blood pressure is controlled, continue current medications.   Orders: -     BMP8+eGFR  Acquired hypothyroidism Assessment & Plan: Thyroid levels are normal. Continue current medications will make changes accordingly  Orders: -     BMP8+eGFR -     TSH + free T4  Prediabetes Assessment & Plan: Stable, continue focusing on diet low in sugar and starches  Orders: -     BMP8+eGFR -     Hemoglobin A1c  Herpes zoster vaccination declined Assessment & Plan: Declines shingrix, educated on disease process and is aware if he changes his mind to notify office    Overweight with body mass index (BMI) of 29 to 29.9 in adult  Hemiparesis affecting right side as late effect of stroke (HCC) Assessment & Plan: Wears a brace to his right lower extremity   Anemia in chronic kidney disease, on chronic dialysis [facility]) Assessment & Plan: Continue with dialysis      Return for keep same next, needs AWV.  Patient was given opportunity to ask questions. Patient verbalized understanding of the plan and was able to repeat key elements of the plan. All questions were answered to their satisfaction.    Jeanell Sparrow, FNP, have reviewed  all documentation for this visit. The documentation on 05/12/23 for the exam, diagnosis, procedures, and orders are all accurate and complete.   IF YOU HAVE BEEN REFERRED TO A SPECIALIST, IT MAY TAKE 1-2 WEEKS TO SCHEDULE/PROCESS THE REFERRAL. IF YOU HAVE NOT HEARD FROM US/SPECIALIST IN TWO WEEKS, PLEASE GIVE [place]  A CALL AT [phone].

## 2023-05-12 NOTE — Assessment & Plan Note (Signed)
 Blood pressure is controlled, continue current medications

## 2023-05-12 NOTE — Assessment & Plan Note (Signed)
Wears a brace to his right lower extremity

## 2023-05-12 NOTE — Assessment & Plan Note (Signed)
Stable, continue focusing on diet low in sugar and starches

## 2023-05-13 LAB — TSH+FREE T4
Free T4: 0.95 ng/dL (ref 0.82–1.77)
TSH: 2.76 u[IU]/mL (ref 0.450–4.500)

## 2023-05-13 LAB — BMP8+EGFR
BUN/Creatinine Ratio: 4 — ABNORMAL LOW (ref 10–24)
BUN: 36 mg/dL — ABNORMAL HIGH (ref 8–27)
CO2: 23 mmol/L (ref 20–29)
Calcium: 8.6 mg/dL (ref 8.6–10.2)
Chloride: 97 mmol/L (ref 96–106)
Creatinine, Ser: 8.55 mg/dL — ABNORMAL HIGH (ref 0.76–1.27)
Glucose: 131 mg/dL — ABNORMAL HIGH (ref 70–99)
Potassium: 4.7 mmol/L (ref 3.5–5.2)
Sodium: 137 mmol/L (ref 134–144)
eGFR: 6 mL/min/{1.73_m2} — ABNORMAL LOW (ref >59)

## 2023-05-13 LAB — HEMOGLOBIN A1C
Est. average glucose Bld gHb Est-mCnc: 103 mg/dL
Hgb A1c MFr Bld: 5.2 % (ref 4.8–5.6)

## 2023-05-30 ENCOUNTER — Ambulatory Visit

## 2023-05-30 ENCOUNTER — Ambulatory Visit (INDEPENDENT_AMBULATORY_CARE_PROVIDER_SITE_OTHER)

## 2023-05-30 DIAGNOSIS — Z0000 Encounter for general adult medical examination without abnormal findings: Secondary | ICD-10-CM

## 2023-05-30 NOTE — Progress Notes (Signed)
 I,Meggan Dhaliwal T Avy Barlett, CMA,acting as a Neurosurgeon for [employer] documented all relevant documentation on the behalf of TIMA CMA WELLNESS,as directed by  [facility] CMA WELLNESS while in the presence of TIMA CMA WELLNESS.  Subjective:  Patient ID: Javier Jenkins , male    DOB: 08/21/1950 , 73 y.o.   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   Stroke [facility]) 1998   right side paralysis     Family History  Problem Relation Age of Onset   Hypertension Mother    Cancer Paternal Uncle        prostate ca     Current Outpatient Medications:    aspirin  325 MG tablet, Take 1 tablet (325 mg total) by mouth daily., Disp: 30 tablet, Rfl: 0   atorvastatin  (LIPITOR) 10 MG tablet, TAKE 1 TABLET BY MOUTH EVERY DAY IN THE EVENING, Disp: 90 tablet, Rfl: 1   cinacalcet  (SENSIPAR ) 60 MG tablet, Take 60 mg by mouth every evening., Disp: , Rfl:    clobetasol ointment (TEMOVATE) 0.05 %, Apply 1 application  topically 2 (two) times daily as needed (skin irritation/rash)., Disp: , Rfl:    levETIRAcetam  (KEPPRA ) 500 MG tablet, TAKE 1 TABLET (500 MG TOTAL) BY MOUTH DAILY., Disp: 90 tablet, Rfl: 1   levothyroxine  (SYNTHROID ) 25 MCG tablet, TAKE 1 TABLET BY MOUTH EVERY DAY, Disp: 90 tablet, Rfl: 1   LOKELMA 10 g PACK packet, Take 10 g by  mouth every Monday, Wednesday, and Friday., Disp: , Rfl:    metoprolol  tartrate (LOPRESSOR ) 100 MG tablet, Take 1 tablet (100 mg total) by mouth daily., Disp: 90 tablet, Rfl: 1   multivitamin (RENA-VIT) TABS tablet, Take 1 tablet by mouth at bedtime., Disp: 90 tablet, Rfl: 0   sevelamer (RENAGEL) 800 MG tablet, Take 800 mg by mouth 3 (three) times daily with meals., Disp: , Rfl:    Allergies  Allergen Reactions   Tape Hives    Paper tape and adhesives     Review of Systems   Today's Vitals   There is no height or weight on file to calculate BMI.  Wt Readings from Last 3 Encounters:  05/12/23 211 lb 9.6 oz (96 kg)  03/14/23 203 lb (92.1 kg)  12/11/22 203 lb 3.2 oz (92.2 kg)     Objective:  Physical Exam      Assessment And Plan:  Encounter for Medicare annual wellness exam     Return in 1 year (on 05/29/2024).  Patient was given opportunity to ask questions. Patient verbalized understanding of the plan and was able to repeat key elements of the plan. All questions were answered to their satisfaction.  TIMA CMA WELLNESS  I, TIMA CMA WELLNESS, have reviewed all documentation for this visit. The documentation on 05/30/23 for the exam, diagnosis, procedures, and orders are all accurate and complete.   IF YOU HAVE BEEN REFERRED TO A SPECIALIST, IT MAY TAKE 1-2 WEEKS TO SCHEDULE/PROCESS THE REFERRAL. IF YOU HAVE NOT HEARD FROM US /SPECIALIST IN TWO WEEKS, PLEASE GIVE US  A CALL AT [phone].   THE PATIENT IS ENCOURAGED TO PRACTICE SOCIAL DISTANCING DUE TO THE COVID-19 PANDEMIC.     Subjective:   Javier Jenkins is a 73 y.o. male who presents for Medicare Annual/Subsequent preventive examination.  Visit Complete: Virtual I connected with  Althia Jetty on 05/30/23 by a audio enabled telemedicine application and verified that I am speaking with the correct person using two identifiers.  Patient Location: Home  Provider Location: Home Office  I discussed the  limitations of evaluation and management by telemedicine. The patient expressed understanding and agreed to proceed.  Vital Signs: Because this visit was a virtual/telehealth visit, some criteria may be missing or patient reported. Any vitals not documented were not able to be obtained and vitals that have been documented are patient reported.  Patient Medicare AWV questionnaire was completed by the patient on 05/30/23; I have confirmed that all information answered by patient is correct and no changes since this date.        Objective:    Today's Vitals   There is no height or weight on file to calculate BMI.     03/14/2023   10:44 AM 01/30/2022    3:39 PM 01/01/2021    6:30 AM 11/01/2020    2:30 PM 08/19/2020    6:25 PM 01/12/2020    3:49 PM 11/24/2018    2:22 PM  Advanced Directives  Does Patient Have a Medical Advance Directive? No No No No No No No  Would patient like information on creating a medical advance directive? No - Patient declined No - Patient declined No - Patient declined        Current Medications (verified) Outpatient Encounter Medications as of 05/30/2023  Medication Sig   aspirin  325 MG tablet Take 1 tablet (325 mg total) by mouth daily.   atorvastatin  (LIPITOR) 10 MG tablet TAKE 1 TABLET BY MOUTH EVERY DAY IN THE EVENING   cinacalcet  (SENSIPAR ) 60 MG tablet Take 60 mg by mouth every evening.   clobetasol ointment (TEMOVATE) 0.05 % Apply 1 application  topically 2 (two) times daily as needed (skin irritation/rash).   levETIRAcetam  (KEPPRA ) 500 MG tablet TAKE 1 TABLET (500 MG TOTAL) BY MOUTH DAILY.   levothyroxine  (SYNTHROID ) 25 MCG tablet TAKE 1 TABLET BY MOUTH EVERY DAY   LOKELMA 10 g PACK packet Take 10 g by mouth every Monday, Wednesday, and Friday.   metoprolol  tartrate (LOPRESSOR ) 100 MG tablet Take 1 tablet (100 mg total) by mouth daily.   multivitamin (RENA-VIT) TABS tablet Take 1 tablet by mouth at bedtime.   sevelamer (RENAGEL) 800 MG tablet Take 800 mg  by mouth 3 (three) times daily with meals.   No facility-administered encounter medications on file as of 05/30/2023.    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   Seizures (HCC)    after head injury   Stroke [facility]) 1998   right side paralysis   Past Surgical History:  Procedure Laterality Date   A/V FISTULAGRAM N/A 03/14/2023   Procedure: A/V Fistulagram;  Surgeon: Patrick Boor, MD;  Location: [facility] INVASIVE CV LAB;  Service: Cardiovascular;  Laterality: N/A;   AV FISTULA PLACEMENT Left 10/18/2014   Procedure: ARTERIOVENOUS (AV) FISTULA CREATION;  Surgeon: Dannis Dy, MD;  Location[facility] OR;  Service: Vascular;  Laterality: Left;   BURR HOLE Right 03/17/2015   Procedure: Janae Mclean HOLES FOR RIGHT SUBDURAL HEMATOMA;  Surgeon: Claudetta Cuba, MD;  Location: [facility] NEURO ORS;  Service: Neurosurgery;  Laterality: Right;   COLONOSCOPY     FISTULA SUPERFICIALIZATION Left 01/01/2021   Procedure: PLICATION OF LEFT ARTERIOVENOUS FISTULA  PSEUDOANEURYSM;  Surgeon: Dannis Dy, MD;  Location: [facility] OR;  Service: Vascular;  Laterality: Left;   FISTULA SUPERFICIALIZATION Left 04/09/2021   Procedure: PLICATION OF LEFT ARTERIOVENOUS FISTULA;  Surgeon: Dannis Dy, MD;  Location: [facility] OR;  Service: Vascular;  Laterality: Left;   HEMORRHOID SURGERY     IR REMOVAL TUN ACCESS W/ PORT W/O FL MOD SED  05/12/2017    PERIPHERAL VASCULAR BALLOON ANGIOPLASTY  03/14/2023   Procedure: PERIPHERAL VASCULAR BALLOON ANGIOPLASTY;  Surgeon: Patrick Boor, MD;  Location: [facility] INVASIVE CV LAB;  Service: Cardiovascular;;  Cephalic Arch; Outflow Cephalic Vein   REVISON OF ARTERIOVENOUS FISTULA Left 09/04/2018   Procedure: EXCISION OF ULCER ON LEFT ARM ARTERIOVENOUS FISTULA;  Surgeon: Dannis Dy, MD;  Location: [facility] OR;  Service: Vascular;  Laterality: Left;   Family History  Problem Relation Age of Onset   Hypertension Mother    Cancer Paternal Uncle        prostate ca   Social History   Socioeconomic History   Marital status: Married    Spouse name: Not on file   Number of children: Not on file   Years of education: Not on file   Highest education level: Master's degree (e.g., MA, MS, MEng, MEd, MSW, MBA)  Occupational History   Occupation: retired  Tobacco Use   Smoking status: Former    Types: Pipe    Quit date: 09/22/1984    Years since quitting: 38.7   Smokeless tobacco: Never  Vaping Use   Vaping status: Never Used  Substance and Sexual Activity   Alcohol use: No    Alcohol/week: 0.0 standard drinks of alcohol   Drug use: No   Sexual activity: Yes    Comment: pastor, married, 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  Other Topics Concern   Not on file  Social History Narrative   Not on file   Social Drivers of Health   Financial Resource Strain: Low Risk  (05/29/2023)   Overall Financial Resource Strain (CARDIA)    Difficulty of Paying Living Expenses: Not hard at all  Food Insecurity: No Food Insecurity (05/29/2023)   Hunger Vital Sign    Worried About Running Out of Food in the Last Year: Never true    Ran Out of Food in  the Last Year: Never true  Transportation Needs: No Transportation Needs (05/29/2023)   PRAPARE - Administrator, Civil Service (Medical): No    Lack of Transportation (Non-Medical): No  Physical Activity: Inactive (05/29/2023)   Exercise Vital Sign    Days of Exercise per  Week: 0 days    Minutes of Exercise per Session: 60 min  Stress: No Stress Concern Present (05/29/2023)   [employer] of Occupational Health - Occupational Stress Questionnaire    Feeling of Stress : Not at all  Social Connections: Socially Integrated (05/29/2023)   Social Connection and Isolation Panel [NHANES]    Frequency of Communication with Friends and Family: More than three times a week    Frequency of Social Gatherings with Friends and Family: Once a week    Attends Religious Services: More than 4 times per year    Active Member of [employer] or Organizations: Yes    Attends Banker Meetings: 1 to 4 times per year    Marital Status: Married    Tobacco Counseling Counseling given: Not Answered   Clinical Intake:  Pre-visit preparation completed: Yes  Pain : No/denies pain     Nutritional Risks: None Diabetes: No  How often do you need to have someone help you when you read instructions, pamphlets, or other written materials from your doctor or pharmacy?: 1 - Never What is the last grade level you completed in school?: masters degree  Interpreter Needed?: No  Information entered by :: [place] [facility]   Activities of Daily Living    05/30/2023    9:16 AM 03/14/2023   10:41 AM  In your present state of health, do you have any difficulty performing the following activities:  Hearing? 0 0  Vision? 0 0  Difficulty concentrating or making decisions? 0 0  Walking or climbing stairs? 0   Dressing or bathing? 0   Doing errands, shopping? 0   Preparing Food and eating ? N   Using the Toilet? N   In the past six months, have you accidently leaked urine? N   Do you have problems with loss of bowel control? N   Managing your Medications? Y   Managing your Finances? N   Housekeeping or managing your Housekeeping? N     Patient Care Team: Susanna Epley, FNP as PCP - General (General Practice) Bryson Carbine, MD as Consulting Physician (Nephrology) Almeda Jacobs,  MD as Consulting Physician (Hematology and Oncology) Center, [facility]  Indicate any recent Medical Services you may have received from other than [facility] providers in the past year (date may be approximate).     Assessment:   This is a routine wellness examination for Javier Jenkins.  Hearing/Vision screen No results found.   Goals Addressed   None   Depression Screen    07/31/2022    2:12 PM 02/13/2022    2:42 PM 01/30/2022    3:39 PM 11/01/2020    2:30 PM 01/12/2020    3:50 PM 04/05/2019    9:54 AM 12/07/2018    9:40 AM  PHQ 2/9 Scores  PHQ - 2 Score 0 0 0 0 0 0 0    Fall Risk    05/30/2023    9:18 AM 02/04/2023   11:42 AM 07/31/2022    2:12 PM 02/13/2022    2:42 PM 01/30/2022    3:39 PM  Fall Risk   Falls in the past year? 0 0 0 0 0  Number falls in  past yr: 0  0 0 0  Injury with Fall? 0 0 0 0 0  Risk for fall due to : No Fall Risks  No Fall Risks No Fall Risks Impaired balance/gait;Medication side effect  Follow up Falls evaluation completed   Falls evaluation completed Falls prevention discussed;Education provided;Falls evaluation completed    MEDICARE RISK AT HOME:       Cognitive Function:        05/30/2023    9:18 AM 01/30/2022    3:40 PM 11/01/2020    2:31 PM 01/12/2020    3:52 PM 11/24/2018    2:27 PM  6CIT Screen  What Year? 0 points 0 points 0 points 0 points 0 points  What month? 0 points 0 points 0 points 0 points 0 points  What time? 0 points 0 points 0 points 0 points 0 points  Count back from 20 0 points 0 points 0 points 0 points 0 points  Months in reverse 0 points 0 points 0 points 0 points 0 points  Repeat phrase 2 points 0 points 0 points 0 points 0 points  Total Score 2 points 0 points 0 points 0 points 0 points    Immunizations Immunization History  Administered Date(s) Administered   Influenza, High Dose Seasonal PF 11/18/2016, 10/22/2018, 10/18/2020   Influenza, Quadrivalent, Recombinant, Inj, Pf 11/20/2021   Influenza,inj,Quad PF,6+ Mos  11/28/2015   Influenza-Unspecified 10/28/2019   Moderna Sars-Covid-2 Vaccination 03/11/2019, 04/08/2019, 12/16/2019    TDAP status: Due, Education has been provided regarding the importance of this vaccine. Advised may receive this vaccine at local pharmacy or Health Dept. Aware to provide a copy of the vaccination record if obtained from local pharmacy or Health Dept. Verbalized acceptance and understanding.  Flu Vaccine status: Up to date  Pneumococcal vaccine status: Up to date  Covid-19 vaccine status: Completed vaccines  Qualifies for Shingles Vaccine? Yes   Zostavax completed No   Shingrix Completed?: No.    Education has been provided regarding the importance of this vaccine. Patient has been advised to call insurance company to determine out of pocket expense if they have not yet received this vaccine. Advised may also receive vaccine at local pharmacy or Health Dept. Verbalized acceptance and understanding.  Screening Tests Health Maintenance  Topic Date Due   Zoster Vaccines- Shingrix (1 of 2) 08/11/2023 (Originally 05/19/1969)   DTaP/Tdap/Td (1 - Tdap) 12/11/2023 (Originally 05/19/1969)   Pneumonia Vaccine 51+ Years old (1 of 2 - PCV) 05/11/2024 (Originally 05/19/1969)   INFLUENZA VACCINE  08/29/2023   Medicare Annual Wellness (AWV)  05/29/2024   Colonoscopy  01/13/2028   Hepatitis C Screening  Completed   HPV VACCINES  Aged Out   Meningococcal B Vaccine  Aged Out   COVID-19 Vaccine  Discontinued    Health Maintenance  There are no preventive care reminders to display for this patient.   Colorectal cancer screening: Type of screening: Colonoscopy. Completed 01/12/2018. Repeat every 10 years   Additional Screening:  Hepatitis C Screening: does qualify; Completed 02/04/19  Dental Screening: Recommended annual dental exams for proper oral hygiene   Community Resource Referral / Chronic Care Management: CRR required this visit?  No   CCM required this visit?   No     Plan:     I have personally reviewed and noted the following in the patient's chart:   Medical and social history Use of alcohol, tobacco or illicit drugs  Current medications and supplements including opioid prescriptions. Patient is not currently  taking opioid prescriptions. Functional ability and status Nutritional status Physical activity Advanced directives List of other physicians Hospitalizations, surgeries, and ER visits in previous 12 months Vitals Screenings to include cognitive, depression, and falls Referrals and appointments  In addition, I have reviewed and discussed with patient certain preventive protocols, quality metrics, and best practice recommendations. A written personalized care plan for preventive services as well as general preventive health recommendations were provided to patient.     Edwinna Grammes, CMA   05/30/2023   After Visit Summary: (MyChart) Due to this being a telephonic visit, the after visit summary with patients personalized plan was offered to patient via MyChart   Nurse Notes: completed.

## 2023-05-30 NOTE — Patient Instructions (Signed)
 Health Maintenance, Male  Adopting a healthy lifestyle and getting preventive care are important in promoting health and wellness. Ask your health care provider about:  The right schedule for you to have regular tests and exams.  Things you can do on your own to prevent diseases and keep yourself healthy.  What should I know about diet, weight, and exercise?  Eat a healthy diet    Eat a diet that includes plenty of vegetables, fruits, low-fat dairy products, and lean protein.  Do not eat a lot of foods that are high in solid fats, added sugars, or sodium.  Maintain a healthy weight  Body mass index (BMI) is a measurement that can be used to identify possible weight problems. It estimates body fat based on height and weight. Your health care provider can help determine your BMI and help you achieve or maintain a healthy weight.  Get regular exercise  Get regular exercise. This is one of the most important things you can do for your health. Most adults should:  Exercise for at least 150 minutes each week. The exercise should increase your heart rate and make you sweat (moderate-intensity exercise).  Do strengthening exercises at least twice a week. This is in addition to the moderate-intensity exercise.  Spend less time sitting. Even light physical activity can be beneficial.  Watch cholesterol and blood lipids  Have your blood tested for lipids and cholesterol at 75 years of age, then have this test every 5 years.  You may need to have your cholesterol levels checked more often if:  Your lipid or cholesterol levels are high.  You are older than 75 years of age.  You are at high risk for heart disease.  What should I know about cancer screening?  Many types of cancers can be detected early and may often be prevented. Depending on your health history and family history, you may need to have cancer screening at various ages. This may include screening for:  Colorectal cancer.  Prostate cancer.  Skin cancer.  Lung  cancer.  What should I know about heart disease, diabetes, and high blood pressure?  Blood pressure and heart disease  High blood pressure causes heart disease and increases the risk of stroke. This is more likely to develop in people who have high blood pressure readings or are overweight.  Talk with your health care provider about your target blood pressure readings.  Have your blood pressure checked:  Every 3-5 years if you are 75-95 years of age.  Every year if you are 75 years old or older.  If you are between the ages of 29 and 29 and are a current or former smoker, ask your health care provider if you should have a one-time screening for abdominal aortic aneurysm (AAA).  Diabetes  Have regular diabetes screenings. This checks your fasting blood sugar level. Have the screening done:  Once every three years after age 23 if you are at a normal weight and have a low risk for diabetes.  More often and at a younger age if you are overweight or have a high risk for diabetes.  What should I know about preventing infection?  Hepatitis B  If you have a higher risk for hepatitis B, you should be screened for this virus. Talk with your health care provider to find out if you are at risk for hepatitis B infection.  Hepatitis C  Blood testing is recommended for:  Everyone born from [birth year] through 1967.  Anyone  with known risk factors for hepatitis C.  Sexually transmitted infections (STIs)  You should be screened each year for STIs, including gonorrhea and chlamydia, if:  You are sexually active and are younger than 75 years of age.  You are older than 75 years of age and your health care provider tells you that you are at risk for this type of infection.  Your sexual activity has changed since you were last screened, and you are at increased risk for chlamydia or gonorrhea. Ask your health care provider if you are at risk.  Ask your health care provider about whether you are at high risk for HIV. Your health care provider  may recommend a prescription medicine to help prevent HIV infection. If you choose to take medicine to prevent HIV, you should first get tested for HIV. You should then be tested every 3 months for as long as you are taking the medicine.  Follow these instructions at home:  Alcohol use  Do not drink alcohol if your health care provider tells you not to drink.  If you drink alcohol:  Limit how much you have to 0-2 drinks a day.  Know how much alcohol is in your drink. In the [place] one drink equals one 12 oz bottle of beer (355 mL), one 5 oz glass of wine (148 mL), or one 1 oz glass of hard liquor (44 mL).  Lifestyle  Do not use any products that contain nicotine or tobacco. These products include cigarettes, chewing tobacco, and vaping devices, such as e-cigarettes. If you need help quitting, ask your health care provider.  Do not use street drugs.  Do not share needles.  Ask your health care provider for help if you need support or information about quitting drugs.  General instructions  Schedule regular health, dental, and eye exams.  Stay current with your vaccines.  Tell your health care provider if:  You often feel depressed.  You have ever been abused or do not feel safe at home.  Summary  Adopting a healthy lifestyle and getting preventive care are important in promoting health and wellness.  Follow your health care provider's instructions about healthy diet, exercising, and getting tested or screened for diseases.  Follow your health care provider's instructions on monitoring your cholesterol and blood pressure.  This information is not intended to replace advice given to you by your health care provider. Make sure you discuss any questions you have with your health care provider.  Document Revised: 06/05/2020 Document Reviewed: 06/05/2020  Elsevier Patient Education  2024 [employer].

## 2023-08-04 ENCOUNTER — Ambulatory Visit (INDEPENDENT_AMBULATORY_CARE_PROVIDER_SITE_OTHER): Payer: Medicare (Managed Care) | Admitting: Nurse Practitioner

## 2023-08-04 ENCOUNTER — Encounter: Payer: Self-pay | Admitting: Nurse Practitioner

## 2023-08-04 VITALS — BP 128/70 | HR 61 | Temp 97.8°F | Ht 71.0 in | Wt 210.4 lb

## 2023-08-04 DIAGNOSIS — E039 Hypothyroidism, unspecified: Secondary | ICD-10-CM

## 2023-08-04 DIAGNOSIS — I120 Hypertensive chronic kidney disease with stage 5 chronic kidney disease or end stage renal disease: Secondary | ICD-10-CM

## 2023-08-04 DIAGNOSIS — Z0000 Encounter for general adult medical examination without abnormal findings: Secondary | ICD-10-CM

## 2023-08-04 DIAGNOSIS — R7303 Prediabetes: Secondary | ICD-10-CM

## 2023-08-04 DIAGNOSIS — Z992 Dependence on renal dialysis: Secondary | ICD-10-CM

## 2023-08-04 DIAGNOSIS — N186 End stage renal disease: Secondary | ICD-10-CM

## 2023-08-04 DIAGNOSIS — Z79899 Other long term (current) drug therapy: Secondary | ICD-10-CM

## 2023-08-04 DIAGNOSIS — H6122 Impacted cerumen, left ear: Secondary | ICD-10-CM

## 2023-08-04 DIAGNOSIS — E782 Mixed hyperlipidemia: Secondary | ICD-10-CM

## 2023-08-04 MED ORDER — LEVOTHYROXINE SODIUM 25 MCG PO TABS: 25.0000 ug | ORAL_TABLET | Freq: Every day | ORAL | 1 refills | Status: AC

## 2023-08-04 NOTE — Progress Notes (Signed)
 [name] [name], CMA,acting as a Neurosurgeon for Gaines Ada, FNP.,have documented all relevant documentation on the behalf of Gaines Ada, FNP,as directed by  Gaines Ada, FNP while in the presence of Gaines Ada, FNP.  Subjective:   Patient ID: Javier Jenkins , male    DOB: 03-24-1950 , 73 y.o.   MRN: [id number]  Chief Complaint  Patient presents with   Annual Exam    Patient presents today for HM, Patient reports compliance with medication. Patient denies any chest pain, SOB, or headaches. Patient has no concerns today.     HPI Discussed the use of AI scribe software for clinical note transcription with the patient, who gave verbal consent to proceed.  History of Present Illness Javier Jenkins is a 73 year old male with end-stage renal disease on dialysis who presents for an annual physical exam.  He continues to attend dialysis sessions three times a week without any issues. He has not experienced new symptoms or complications related to his end-stage renal disease. He has not seen any other doctors since his last visit, except for a podiatrist, with no changes noted.  He is currently taking metoprolol , levothyroxine , and Keppra . He does not require refills for metoprolol  and Keppra  but requests a new prescription for levothyroxine . He has not received the pneumonia vaccine and does not wish to receive it today.  He reports no current health concerns and states that 'everything's going pretty good so far.' He uses a 'lips machine' to exercise his legs and maintain blood circulation. His diet is managed by dieticians at the dialysis center, and he follows a kidney-friendly diet, avoiding foods like potatoes.  No shortness of breath, chest pain, swelling in feet or ankles, constipation, diarrhea, urinary issues, or hearing difficulties. He wears a brief 'just in case' but does not experience accidents. He describes the sensation during cleaning attempts as 'funny' and 'hurting a little  bit.'   Here for HM.         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   Stroke [facility]) 1998   right side paralysis     Family History  Problem Relation Age of Onset   Hypertension Mother    Cancer Paternal Uncle        prostate ca     Current Outpatient Medications:    aspirin  325 MG tablet, Take 1 tablet (325 mg total) by mouth daily., Disp: 30 tablet, Rfl: 0   atorvastatin  (LIPITOR) 10 MG tablet, TAKE 1 TABLET BY MOUTH EVERY DAY IN THE EVENING, Disp: 90 tablet, Rfl: 1   cinacalcet  (SENSIPAR ) 60 MG tablet, Take 60 mg by mouth every evening., Disp: , Rfl:    clobetasol ointment (TEMOVATE) 0.05 %, Apply 1 application  topically 2 (two) times daily  as needed (skin irritation/rash)., Disp: , Rfl:    levETIRAcetam  (KEPPRA ) 500 MG tablet, TAKE 1 TABLET (500 MG TOTAL) BY MOUTH DAILY., Disp: 90 tablet, Rfl: 1   LOKELMA 10 g PACK packet, Take 10 g by mouth every Monday, Wednesday, and Friday., Disp: , Rfl:    metoprolol  tartrate (LOPRESSOR ) 100 MG tablet, Take 1 tablet (100 mg total) by mouth daily., Disp: 90 tablet, Rfl: 1   multivitamin (RENA-VIT) TABS tablet, Take 1 tablet by mouth at bedtime., Disp: 90 tablet, Rfl: 0   sevelamer (RENAGEL) 800 MG tablet, Take 800 mg by mouth 3 (three) times daily with meals., Disp: , Rfl:    levothyroxine  (SYNTHROID ) 25 MCG tablet, Take 1 tablet (25 mcg total) by mouth  daily., Disp: 90 tablet, Rfl: 1   Allergies  Allergen Reactions   Tape Hives    Paper tape and adhesives     Men's preventive visit. Patient Health Questionnaire (PHQ-2) is  Flowsheet Row Office Visit from 08/04/2023 in [facility]  PHQ-2 Total Score 0  Patient is on a Renal diet. Marital status: Married. Relevant history for alcohol use is:  Social History   Substance and Sexual Activity  Alcohol Use No   Alcohol/week: 0.0 standard drinks of alcohol   Relevant history for tobacco use is:  Social History   Tobacco Use  Smoking Status Former   Types: Pipe   Quit date: 09/22/1984   Years since quitting: 38.9  Smokeless Tobacco Never  .   Review of Systems  Constitutional: Negative.   Respiratory: Negative.    Cardiovascular: Negative.   Neurological: Negative.   Psychiatric/Behavioral: Negative.       Today's Vitals   08/04/23 1410  BP: 128/70  Pulse: 61  Temp: 97.8 F (36.6 C)  TempSrc: Oral  Weight: 210 lb 6.4 oz (95.4 kg)  Height: 5' 11 (1.803 m)  PainSc: 0-No pain   Body mass index is 29.34 kg/m.  Wt Readings from Last 3 Encounters:  08/04/23 210 lb 6.4 oz (95.4 kg)  05/12/23 211 lb 9.6 oz (96 kg)  03/14/23 203 lb (92.1 kg)    Objective:  Physical Exam Vitals and nursing note reviewed.  Constitutional:      General: He is not in acute distress.    Appearance: Normal appearance. He is obese.  HENT:     Head: Normocephalic and atraumatic.     Right Ear: Tympanic membrane, ear canal and external ear normal. There is no impacted cerumen.     Left Ear: Tympanic membrane, ear canal and external ear normal. There is no impacted cerumen.     Nose: Nose normal.     Mouth/Throat:     Mouth: Mucous membranes are moist.  Eyes:     Extraocular Movements: Extraocular movements intact.     Conjunctiva/sclera: Conjunctivae normal.     Pupils: Pupils are equal, round, and reactive to light.  Cardiovascular:     Rate and  Rhythm: Normal rate and regular rhythm.     Pulses: Normal pulses.     Heart sounds: Normal heart sounds. No murmur heard.    Arteriovenous access: Left arteriovenous access is present.  Pulmonary:     Effort: Pulmonary effort is normal. No respiratory distress.     Breath sounds: Normal breath sounds. No wheezing.  Abdominal:     General: Abdomen is flat. Bowel sounds are normal. There is no distension.     Palpations: Abdomen is soft.  Tenderness: There is no abdominal tenderness.  Musculoskeletal:        General: No swelling or tenderness. Normal range of motion.     Cervical back: Normal range of motion and neck supple.     Comments: RUE and RLE hemiparesis with contracture at the wrist, RLE brace in place.   Skin:    General: Skin is warm and dry.     Capillary Refill: Capillary refill takes less than 2 seconds.  Neurological:     General: No focal deficit present.     Mental Status: He is alert and oriented to person, place, and time.     Cranial Nerves: No cranial nerve deficit.     Motor: No weakness.  Psychiatric:        Mood and Affect: Mood normal.        Behavior: Behavior normal.        Thought Content: Thought content normal.        Judgment: Judgment normal.         Assessment And Plan:    Encounter for annual health examination Assessment & Plan: Vaccinations current except for declined pneumonia vaccine. Engages in leg exercises and follows a kidney-friendly diet. - Encourage regular physical activity as tolerated. - Continue following a kidney-friendly diet.   Benign hypertension with end-stage renal disease (HCC) Assessment & Plan: Blood pressure is controlled, continue current medications.   Orders: -     EKG 12-Lead -     CMP14+EGFR  Acquired hypothyroidism Assessment & Plan: On levothyroxine  therapy. A new prescription is requested. - Send a new prescription for levothyroxine .  Orders: -     TSH + free T4  Prediabetes Assessment &  Plan: Stable, continue focusing on diet low in sugar and starches  Orders: -     Hemoglobin A1c  Other long term (current) drug therapy -     CBC with Differential/Platelet  ESRD on dialysis [facility]) Assessment & Plan: Undergoing dialysis thrice weekly without complications or new symptoms. - Continue dialysis sessions on Tuesday, Thursday, and Saturday   Mixed hyperlipidemia -     Lipid panel  Impacted cerumen of left ear Assessment & Plan: Significant earwax in the left ear causing discomfort. Discussed removal options including flushing and earwax softeners. - Flush the left ear to remove earwax. - Advise using a half water, half peroxide solution or Debrox drops to soften earwax in the future.   Other orders -     Levothyroxine  Sodium; Take 1 tablet (25 mcg total) by mouth daily.  Dispense: 90 tablet; Refill: 1      Return for 1 year physical, 6 month bp check. Patient was given opportunity to ask questions. Patient verbalized understanding of the plan and was able to repeat key elements of the plan. All questions were answered to their satisfaction.   Gaines Ada, FNP  I, Gaines Ada, FNP, have reviewed all documentation for this visit. The documentation on 08/04/23 for the exam, diagnosis, procedures, and orders are all accurate and complete.

## 2023-08-05 LAB — CBC WITH DIFFERENTIAL/PLATELET
Basophils Absolute: 0.1 x10E3/uL (ref 0.0–0.2)
Basos: 1 %
EOS (ABSOLUTE): 1 x10E3/uL — ABNORMAL HIGH (ref 0.0–0.4)
Eos: 13 %
Hematocrit: 39.6 % (ref 37.5–51.0)
Hemoglobin: 12.1 g/dL — ABNORMAL LOW (ref 13.0–17.7)
Immature Grans (Abs): 0 x10E3/uL (ref 0.0–0.1)
Immature Granulocytes: 0 %
Lymphocytes Absolute: 2 x10E3/uL (ref 0.7–3.1)
Lymphs: 27 %
MCH: 26.1 pg — ABNORMAL LOW (ref 26.6–33.0)
MCHC: 30.6 g/dL — ABNORMAL LOW (ref 31.5–35.7)
MCV: 85 fL (ref 79–97)
Monocytes Absolute: 0.7 x10E3/uL (ref 0.1–0.9)
Monocytes: 9 %
Neutrophils Absolute: 3.5 x10E3/uL (ref 1.4–7.0)
Neutrophils: 50 %
Platelets: 291 x10E3/uL (ref 150–450)
RBC: 4.64 x10E6/uL (ref 4.14–5.80)
RDW: 16.5 % — ABNORMAL HIGH (ref 11.6–15.4)
WBC: 7.2 x10E3/uL (ref 3.4–10.8)

## 2023-08-05 LAB — CMP14+EGFR
ALT: 10 IU/L (ref 0–44)
AST: 14 IU/L (ref 0–40)
Albumin: 4.3 g/dL (ref 3.8–4.8)
Alkaline Phosphatase: 71 IU/L (ref 44–121)
BUN/Creatinine Ratio: 6 — ABNORMAL LOW (ref 10–24)
BUN: 55 mg/dL — ABNORMAL HIGH (ref 8–27)
Bilirubin Total: 0.2 mg/dL (ref 0.0–1.2)
CO2: 20 mmol/L (ref 20–29)
Calcium: 9.1 mg/dL (ref 8.6–10.2)
Chloride: 95 mmol/L — ABNORMAL LOW (ref 96–106)
Creatinine, Ser: 9.89 mg/dL — ABNORMAL HIGH (ref 0.76–1.27)
Globulin, Total: 2.9 g/dL (ref 1.5–4.5)
Glucose: 89 mg/dL (ref 70–99)
Potassium: 5.2 mmol/L (ref 3.5–5.2)
Sodium: 135 mmol/L (ref 134–144)
Total Protein: 7.2 g/dL (ref 6.0–8.5)
eGFR: 5 mL/min/1.73 — ABNORMAL LOW (ref >59)

## 2023-08-05 LAB — LIPID PANEL
Chol/HDL Ratio: 3.3 ratio (ref 0.0–5.0)
Cholesterol, Total: 153 mg/dL (ref 100–199)
HDL: 46 mg/dL (ref >39)
LDL Chol Calc (NIH): 69 mg/dL (ref 0–99)
Triglycerides: 230 mg/dL — ABNORMAL HIGH (ref 0–149)
VLDL Cholesterol Cal: 38 mg/dL (ref 5–40)

## 2023-08-05 LAB — TSH+FREE T4
Free T4: 0.89 ng/dL (ref 0.82–1.77)
TSH: 2.4 u[IU]/mL (ref 0.450–4.500)

## 2023-08-05 LAB — HEMOGLOBIN A1C
Est. average glucose Bld gHb Est-mCnc: 114 mg/dL
Hgb A1c MFr Bld: 5.6 % (ref 4.8–5.6)

## 2023-08-17 ENCOUNTER — Ambulatory Visit: Payer: Self-pay | Admitting: Nurse Practitioner

## 2023-08-17 DIAGNOSIS — H6122 Impacted cerumen, left ear: Secondary | ICD-10-CM | POA: Insufficient documentation

## 2023-08-17 NOTE — Assessment & Plan Note (Signed)
 Blood pressure is controlled, continue current medications

## 2023-08-17 NOTE — Assessment & Plan Note (Signed)
 On levothyroxine  therapy. A new prescription is requested. - Send a new prescription for levothyroxine .

## 2023-08-17 NOTE — Assessment & Plan Note (Signed)
 Undergoing dialysis thrice weekly without complications or new symptoms. - Continue dialysis sessions on Tuesday, Thursday, and Saturday

## 2023-08-17 NOTE — Assessment & Plan Note (Signed)
Stable, continue focusing on diet low in sugar and starches

## 2023-08-17 NOTE — Assessment & Plan Note (Signed)
 Vaccinations current except for declined pneumonia vaccine. Engages in leg exercises and follows a kidney-friendly diet. - Encourage regular physical activity as tolerated. - Continue following a kidney-friendly diet.

## 2023-08-17 NOTE — Assessment & Plan Note (Signed)
 Significant earwax in the left ear causing discomfort. Discussed removal options including flushing and earwax softeners. - Flush the left ear to remove earwax. - Advise using a half water, half peroxide solution or Debrox drops to soften earwax in the future.

## 2023-09-07 ENCOUNTER — Other Ambulatory Visit: Payer: Self-pay | Admitting: Nurse Practitioner

## 2023-09-07 DIAGNOSIS — R7303 Prediabetes: Secondary | ICD-10-CM

## 2023-09-18 ENCOUNTER — Other Ambulatory Visit: Payer: Self-pay | Admitting: Nurse Practitioner

## 2023-09-18 DIAGNOSIS — I120 Hypertensive chronic kidney disease with stage 5 chronic kidney disease or end stage renal disease: Secondary | ICD-10-CM

## 2024-02-09 ENCOUNTER — Ambulatory Visit: Payer: Self-pay | Admitting: Nurse Practitioner

## 2024-02-09 ENCOUNTER — Encounter: Payer: Self-pay | Admitting: Nurse Practitioner

## 2024-02-09 VITALS — BP 110/70 | HR 75 | Temp 98.7°F | Ht 71.0 in | Wt 206.4 lb

## 2024-02-09 DIAGNOSIS — I69351 Hemiplegia and hemiparesis following cerebral infarction affecting right dominant side: Secondary | ICD-10-CM

## 2024-02-09 DIAGNOSIS — Z2989 Encounter for other specified prophylactic measures: Secondary | ICD-10-CM

## 2024-02-09 DIAGNOSIS — E782 Mixed hyperlipidemia: Secondary | ICD-10-CM

## 2024-02-09 DIAGNOSIS — R7303 Prediabetes: Secondary | ICD-10-CM

## 2024-02-09 DIAGNOSIS — E039 Hypothyroidism, unspecified: Secondary | ICD-10-CM | POA: Diagnosis not present

## 2024-02-09 DIAGNOSIS — E663 Overweight: Secondary | ICD-10-CM

## 2024-02-09 DIAGNOSIS — Z2821 Immunization not carried out because of patient refusal: Secondary | ICD-10-CM | POA: Diagnosis not present

## 2024-02-09 DIAGNOSIS — N186 End stage renal disease: Secondary | ICD-10-CM

## 2024-02-09 DIAGNOSIS — I120 Hypertensive chronic kidney disease with stage 5 chronic kidney disease or end stage renal disease: Secondary | ICD-10-CM

## 2024-02-09 DIAGNOSIS — Z6828 Body mass index (BMI) 28.0-28.9, adult: Secondary | ICD-10-CM

## 2024-02-09 MED ORDER — LEVETIRACETAM 500 MG PO TABS: 500.0000 mg | ORAL_TABLET | Freq: Every day | ORAL | 1 refills | Status: AC

## 2024-02-09 NOTE — Progress Notes (Signed)
 Javier Jenkins, CMA,acting as a neurosurgeon for Javier Jenkins, Javier Jenkins.,have documented all relevant documentation on the behalf of Javier Jenkins, Javier Jenkins,as directed by  Javier Jenkins, Javier Jenkins while in the presence of Javier Jenkins, Javier Jenkins.  Subjective:  Patient ID: Javier Jenkins , male    DOB: [birth year] , 74 y.o.   MRN: [id number]  Chief Complaint  Patient presents with   Hypertension    Patient presents today for a bp and chol follow up, Patient reports compliance with medication. Patient denies any chest pain, SOB, or headaches. Patient has no concerns today.          Discussed the use of AI scribe software for clinical note transcription with the patient, who gave verbal consent to proceed.  History of Present Illness Javier Jenkins is a 74 year old male who presents for a routine follow-up visit.  He continues to receive dialysis on a regular schedule, attending sessions on Tuesdays, Thursdays, and Saturdays with no complications reported.  He requests a refill for Keppra , which he is currently taking. He recently refilled his levothyroxine  last week and does not require a refill at this time.  He has not seen any other doctors in the past six months except for an eye doctor. He confirms that he is up to date with his health maintenance.  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   Respiratory: Negative.    Cardiovascular: Negative.  Negative for chest pain, palpitations and leg swelling.  Gastrointestinal: Negative.   Musculoskeletal: Negative.   Neurological: Negative.  Negative for headaches.  Psychiatric/Behavioral: Negative.       Today's Vitals   02/09/24 1018  BP: 110/70  Pulse: 75  Temp: 98.7 F (37.1 C)  TempSrc: Oral  Weight: 206 lb 6.4 oz (93.6 kg)  Height: 5' 11 (1.803 m)  PainSc: 0-No pain   Body mass index is 28.79 kg/m.  Wt Readings from Last 3 Encounters:  02/09/24 206 lb 6.4 oz (93.6 kg)  08/04/23 210 lb 6.4 oz (95.4 kg)  05/12/23 211 lb 9.6 oz (96 kg)     Objective:  Physical Exam Vitals and nursing note reviewed.  Constitutional:      General: He is not in acute distress.    Appearance: Normal appearance. He is obese.  HENT:     Head: Normocephalic.  Eyes:     Pupils: Pupils are equal, round, and reactive to light.  Cardiovascular:  Rate and Rhythm: Normal rate and regular rhythm.     Pulses: Normal pulses.     Heart sounds: Normal heart sounds. No murmur heard.    Arteriovenous access: Left arteriovenous access is present.     Comments: Positive thrill and bruit to left AVF Pulmonary:     Effort: Pulmonary effort is normal. No respiratory distress.     Breath sounds: Normal breath sounds. No wheezing.  Musculoskeletal:     Comments: Leg brace on right lower extremity from previous stroke.   Skin:    General: Skin is warm and dry.     Capillary Refill: Capillary refill takes less than 2 seconds.  Neurological:     General: No focal deficit present.     Mental Status: He is alert and oriented to person, place, and time.     Cranial Nerves: No cranial nerve deficit.     Motor: No weakness.   Psychiatric:        Mood and Affect: Mood normal.        Behavior: Behavior normal.        Thought Content: Thought content normal.        Judgment: Judgment normal.      Assessment And Plan:   Assessment & Plan Benign hypertension with end-stage renal disease (HCC) Continues dialysis without issues. - Continue dialysis on Tuesday and Thursday. - Blood pressure is controlled. Continue current medications Prediabetes Stable, continue focusing on diet low in sugar and starches Mixed hyperlipidemia Stable, continue current medications Acquired hypothyroidism Continue current medications, will make changes pending lab results Seizure prophylaxis  Herpes zoster vaccination declined  Tetanus, diphtheria, and acellular pertussis (Tdap) vaccination declined  Overweight with body mass index (BMI) of 28 to 28.9 in adult  Hemiparesis affecting right side as late effect of stroke (HCC) Wears a brace to his right lower extremity  Orders Placed This Encounter  Procedures   Hemoglobin A1c   Lipid panel   BMP8+eGFR   TSH + free T4     Return for keep same next.  Patient was given opportunity to ask questions. Patient verbalized understanding of the plan and was able to repeat key elements of the plan. All questions were answered to their satisfaction.    Javier Javier Jenkins, Javier Jenkins, have reviewed all documentation for this visit. The documentation on 02/09/2024 for the exam, diagnosis, procedures, and orders are all accurate and complete.   IF YOU HAVE BEEN REFERRED TO A SPECIALIST, IT MAY TAKE 1-2 WEEKS TO SCHEDULE/PROCESS THE REFERRAL. IF YOU HAVE NOT HEARD FROM US /SPECIALIST IN TWO WEEKS, PLEASE GIVE US  A CALL AT [phone] X [phone].      [1]  Current Outpatient Medications:    aspirin  325 MG tablet, Take 1 tablet (325 mg total) by mouth daily., Disp: 30 tablet, Rfl: 0   atorvastatin  (LIPITOR) 10 MG tablet, TAKE 1 TABLET BY MOUTH EVERY DAY IN THE EVENING, Disp: 90 tablet, Rfl: 1    cinacalcet  (SENSIPAR ) 60 MG tablet, Take 60 mg by mouth every evening., Disp: , Rfl:    clobetasol ointment (TEMOVATE) 0.05 %, Apply 1 application  topically 2 (two) times daily as needed (skin irritation/rash)., Disp: , Rfl:    levothyroxine  (SYNTHROID ) 25 MCG tablet, Take 1 tablet (25 mcg total) by mouth daily., Disp: 90 tablet, Rfl: 1   LOKELMA 10 g PACK packet, Take 10 g by mouth every Monday, Wednesday, and Friday., Disp: , Rfl:    metoprolol  tartrate (LOPRESSOR ) 100 MG tablet, TAKE 1  TABLET BY MOUTH EVERY DAY, Disp: 90 tablet, Rfl: 1   multivitamin (RENA-VIT) TABS tablet, Take 1 tablet by mouth at bedtime., Disp: 90 tablet, Rfl: 0   sevelamer (RENAGEL) 800 MG tablet, Take 800 mg by mouth 3 (three) times daily with meals., Disp: , Rfl:    levETIRAcetam  (KEPPRA ) 500 MG tablet, Take 1 tablet (500 mg total) by mouth daily., Disp: 90 tablet, Rfl: 1 [2]  Allergies Allergen Reactions   Tape Hives    Paper tape and adhesives

## 2024-02-09 NOTE — Assessment & Plan Note (Addendum)
Wears a brace to his right lower extremity

## 2024-02-09 NOTE — Assessment & Plan Note (Addendum)
 Continue current medications, will make changes pending lab results

## 2024-02-09 NOTE — Assessment & Plan Note (Addendum)
 Continues dialysis without issues. - Continue dialysis on Tuesday and Thursday. - Blood pressure is controlled. Continue current medications

## 2024-02-09 NOTE — Assessment & Plan Note (Addendum)
 Stable, continue current medications.

## 2024-02-09 NOTE — Assessment & Plan Note (Addendum)
Stable, continue focusing on diet low in sugar and starches

## 2024-02-10 LAB — TSH+FREE T4
Free T4: 0.96 ng/dL (ref 0.82–1.77)
TSH: 2.97 u[IU]/mL (ref 0.450–4.500)

## 2024-02-10 LAB — LIPID PANEL
Chol/HDL Ratio: 3.8 ratio (ref 0.0–5.0)
Cholesterol, Total: 177 mg/dL (ref 100–199)
HDL: 46 mg/dL
LDL Chol Calc (NIH): 93 mg/dL (ref 0–99)
Triglycerides: 222 mg/dL — ABNORMAL HIGH (ref 0–149)
VLDL Cholesterol Cal: 38 mg/dL (ref 5–40)

## 2024-02-10 LAB — BMP8+EGFR
BUN/Creatinine Ratio: 4 — ABNORMAL LOW (ref 10–24)
BUN: 38 mg/dL — ABNORMAL HIGH (ref 8–27)
CO2: 20 mmol/L (ref 20–29)
Calcium: 8.8 mg/dL (ref 8.6–10.2)
Chloride: 95 mmol/L — ABNORMAL LOW (ref 96–106)
Creatinine, Ser: 10.14 mg/dL — ABNORMAL HIGH (ref 0.76–1.27)
Glucose: 133 mg/dL — ABNORMAL HIGH (ref 70–99)
Potassium: 5.1 mmol/L (ref 3.5–5.2)
Sodium: 135 mmol/L (ref 134–144)
eGFR: 5 mL/min/1.73 — ABNORMAL LOW

## 2024-02-10 LAB — HEMOGLOBIN A1C
Est. average glucose Bld gHb Est-mCnc: 117 mg/dL
Hgb A1c MFr Bld: 5.7 % — ABNORMAL HIGH (ref 4.8–5.6)

## 2024-07-07 ENCOUNTER — Ambulatory Visit: Payer: Self-pay

## 2024-08-04 ENCOUNTER — Encounter: Payer: Self-pay | Admitting: Nurse Practitioner
